# Patient Record
Sex: Male | Born: 1940
Health system: Southern US, Community
[De-identification: ages and names within clinical notes are randomized; demographics above are authoritative.]

## PROBLEM LIST (undated history)

## (undated) DIAGNOSIS — C8589 Other specified types of non-Hodgkin lymphoma, extranodal and solid organ sites: Secondary | ICD-10-CM

## (undated) DIAGNOSIS — I4891 Unspecified atrial fibrillation: Secondary | ICD-10-CM

## (undated) DIAGNOSIS — I10 Essential (primary) hypertension: Secondary | ICD-10-CM

## (undated) DIAGNOSIS — E119 Type 2 diabetes mellitus without complications: Secondary | ICD-10-CM

## (undated) DIAGNOSIS — I059 Rheumatic mitral valve disease, unspecified: Secondary | ICD-10-CM

## (undated) DIAGNOSIS — Z9289 Personal history of other medical treatment: Secondary | ICD-10-CM

## (undated) DIAGNOSIS — G4733 Obstructive sleep apnea (adult) (pediatric): Secondary | ICD-10-CM

## (undated) DIAGNOSIS — Z87442 Personal history of urinary calculi: Secondary | ICD-10-CM

## (undated) DIAGNOSIS — M519 Unspecified thoracic, thoracolumbar and lumbosacral intervertebral disc disorder: Secondary | ICD-10-CM

## (undated) DIAGNOSIS — I350 Nonrheumatic aortic (valve) stenosis: Secondary | ICD-10-CM

## (undated) DIAGNOSIS — S68119A Complete traumatic metacarpophalangeal amputation of unspecified finger, initial encounter: Secondary | ICD-10-CM

## (undated) DIAGNOSIS — I482 Chronic atrial fibrillation, unspecified: Secondary | ICD-10-CM

## (undated) DIAGNOSIS — I5032 Chronic diastolic (congestive) heart failure: Secondary | ICD-10-CM

## (undated) DIAGNOSIS — IMO0002 Reserved for concepts with insufficient information to code with codable children: Secondary | ICD-10-CM

## (undated) DIAGNOSIS — I499 Cardiac arrhythmia, unspecified: Secondary | ICD-10-CM

## (undated) DIAGNOSIS — C801 Malignant (primary) neoplasm, unspecified: Secondary | ICD-10-CM

## (undated) DIAGNOSIS — M1990 Unspecified osteoarthritis, unspecified site: Secondary | ICD-10-CM

## (undated) HISTORY — DX: Obstructive sleep apnea (adult) (pediatric): G47.33

## (undated) HISTORY — DX: Chronic atrial fibrillation, unspecified: I48.20

## (undated) HISTORY — PX: OTHER SURGICAL HISTORY: SHX169

## (undated) HISTORY — DX: Complete traumatic metacarpophalangeal amputation of unspecified finger, initial encounter: S68.119A

## (undated) HISTORY — DX: Nonrheumatic aortic (valve) stenosis: I35.0

## (undated) HISTORY — PX: TONSILLECTOMY: SUR1361

## (undated) HISTORY — DX: Essential (primary) hypertension: I10

## (undated) HISTORY — DX: Personal history of urinary calculi: Z87.442

## (undated) HISTORY — DX: Malignant (primary) neoplasm, unspecified: C80.1

## (undated) HISTORY — DX: Unspecified thoracic, thoracolumbar and lumbosacral intervertebral disc disorder: M51.9

## (undated) HISTORY — PX: COLONOSCOPY: SHX174

## (undated) HISTORY — DX: Reserved for concepts with insufficient information to code with codable children: IMO0002

## (undated) HISTORY — DX: Other specified types of non-hodgkin lymphoma, extranodal and solid organ sites: C85.89

---
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
## 1965-04-03 HISTORY — PX: APPENDECTOMY: SHX54

## 2001-05-15 ENCOUNTER — Encounter: Payer: Self-pay | Admitting: Emergency Medicine

## 2001-05-15 ENCOUNTER — Emergency Department (HOSPITAL_COMMUNITY): Admission: EM | Admit: 2001-05-15 | Discharge: 2001-05-15 | Payer: Self-pay | Admitting: Emergency Medicine

## 2002-08-13 ENCOUNTER — Ambulatory Visit (HOSPITAL_COMMUNITY): Admission: RE | Admit: 2002-08-13 | Discharge: 2002-08-13 | Payer: Self-pay | Admitting: Gastroenterology

## 2002-08-13 ENCOUNTER — Encounter (INDEPENDENT_AMBULATORY_CARE_PROVIDER_SITE_OTHER): Payer: Self-pay | Admitting: Specialist

## 2003-04-17 ENCOUNTER — Encounter: Admission: RE | Admit: 2003-04-17 | Discharge: 2003-04-17 | Payer: Self-pay

## 2003-04-24 ENCOUNTER — Encounter (INDEPENDENT_AMBULATORY_CARE_PROVIDER_SITE_OTHER): Payer: Self-pay | Admitting: Specialist

## 2003-04-24 ENCOUNTER — Ambulatory Visit (HOSPITAL_COMMUNITY): Admission: RE | Admit: 2003-04-24 | Discharge: 2003-04-24 | Payer: Self-pay | Admitting: Oncology

## 2003-05-05 ENCOUNTER — Ambulatory Visit (HOSPITAL_COMMUNITY): Admission: RE | Admit: 2003-05-05 | Discharge: 2003-05-05 | Payer: Self-pay | Admitting: Oncology

## 2003-05-05 ENCOUNTER — Encounter (INDEPENDENT_AMBULATORY_CARE_PROVIDER_SITE_OTHER): Payer: Self-pay | Admitting: Specialist

## 2003-05-06 ENCOUNTER — Inpatient Hospital Stay (HOSPITAL_COMMUNITY): Admission: AD | Admit: 2003-05-06 | Discharge: 2003-05-08 | Payer: Self-pay | Admitting: Oncology

## 2003-05-15 ENCOUNTER — Ambulatory Visit (HOSPITAL_COMMUNITY): Admission: RE | Admit: 2003-05-15 | Discharge: 2003-05-15 | Payer: Self-pay | Admitting: Surgery

## 2003-06-03 ENCOUNTER — Ambulatory Visit (HOSPITAL_COMMUNITY): Admission: RE | Admit: 2003-06-03 | Discharge: 2003-06-03 | Payer: Self-pay | Admitting: Oncology

## 2003-07-01 ENCOUNTER — Ambulatory Visit (HOSPITAL_COMMUNITY): Admission: RE | Admit: 2003-07-01 | Discharge: 2003-07-01 | Payer: Self-pay | Admitting: Oncology

## 2003-09-03 ENCOUNTER — Ambulatory Visit (HOSPITAL_COMMUNITY): Admission: RE | Admit: 2003-09-03 | Discharge: 2003-09-03 | Payer: Self-pay | Admitting: Oncology

## 2003-10-16 ENCOUNTER — Ambulatory Visit (HOSPITAL_BASED_OUTPATIENT_CLINIC_OR_DEPARTMENT_OTHER): Admission: RE | Admit: 2003-10-16 | Discharge: 2003-10-16 | Payer: Self-pay | Admitting: Surgery

## 2003-10-21 ENCOUNTER — Encounter: Admission: RE | Admit: 2003-10-21 | Discharge: 2003-10-21 | Payer: Self-pay | Admitting: Family Medicine

## 2004-04-05 ENCOUNTER — Ambulatory Visit: Payer: Self-pay | Admitting: Oncology

## 2004-04-06 ENCOUNTER — Ambulatory Visit (HOSPITAL_COMMUNITY): Admission: RE | Admit: 2004-04-06 | Discharge: 2004-04-06 | Payer: Self-pay | Admitting: Oncology

## 2004-08-03 ENCOUNTER — Ambulatory Visit: Payer: Self-pay | Admitting: Oncology

## 2004-08-12 ENCOUNTER — Ambulatory Visit (HOSPITAL_BASED_OUTPATIENT_CLINIC_OR_DEPARTMENT_OTHER): Admission: RE | Admit: 2004-08-12 | Discharge: 2004-08-12 | Payer: Self-pay | Admitting: Specialist

## 2004-08-12 ENCOUNTER — Ambulatory Visit (HOSPITAL_COMMUNITY): Admission: RE | Admit: 2004-08-12 | Discharge: 2004-08-12 | Payer: Self-pay | Admitting: Specialist

## 2004-09-05 ENCOUNTER — Encounter: Admission: RE | Admit: 2004-09-05 | Discharge: 2004-09-05 | Payer: Self-pay | Admitting: Specialist

## 2004-11-23 ENCOUNTER — Ambulatory Visit: Payer: Self-pay | Admitting: Oncology

## 2005-04-04 ENCOUNTER — Ambulatory Visit: Payer: Self-pay | Admitting: Oncology

## 2005-04-06 ENCOUNTER — Ambulatory Visit (HOSPITAL_COMMUNITY): Admission: RE | Admit: 2005-04-06 | Discharge: 2005-04-06 | Payer: Self-pay | Admitting: Oncology

## 2005-10-05 ENCOUNTER — Ambulatory Visit: Payer: Self-pay | Admitting: Oncology

## 2006-04-09 ENCOUNTER — Ambulatory Visit: Payer: Self-pay | Admitting: Oncology

## 2006-10-09 ENCOUNTER — Ambulatory Visit: Payer: Self-pay | Admitting: Oncology

## 2007-04-11 ENCOUNTER — Ambulatory Visit: Payer: Self-pay | Admitting: Oncology

## 2007-10-09 ENCOUNTER — Ambulatory Visit: Payer: Self-pay | Admitting: Oncology

## 2008-04-10 ENCOUNTER — Ambulatory Visit: Payer: Self-pay | Admitting: Oncology

## 2009-01-06 ENCOUNTER — Encounter (INDEPENDENT_AMBULATORY_CARE_PROVIDER_SITE_OTHER): Payer: Self-pay | Admitting: Interventional Cardiology

## 2009-01-06 ENCOUNTER — Ambulatory Visit (HOSPITAL_COMMUNITY): Admission: RE | Admit: 2009-01-06 | Discharge: 2009-01-06 | Payer: Self-pay | Admitting: Interventional Cardiology

## 2009-05-04 ENCOUNTER — Ambulatory Visit: Payer: Self-pay | Admitting: Oncology

## 2010-05-05 ENCOUNTER — Encounter: Payer: No Typology Code available for payment source | Admitting: Oncology

## 2010-05-05 ENCOUNTER — Inpatient Hospital Stay (HOSPITAL_BASED_OUTPATIENT_CLINIC_OR_DEPARTMENT_OTHER): Payer: No Typology Code available for payment source | Admitting: Oncology

## 2010-05-05 DIAGNOSIS — I4891 Unspecified atrial fibrillation: Secondary | ICD-10-CM

## 2010-05-05 DIAGNOSIS — Z7901 Long term (current) use of anticoagulants: Secondary | ICD-10-CM

## 2010-05-05 DIAGNOSIS — C8589 Other specified types of non-Hodgkin lymphoma, extranodal and solid organ sites: Secondary | ICD-10-CM

## 2010-08-19 NOTE — Op Note (Signed)
NAME:  Gregory [name], Gregory [name]                           ACCOUNT NO.:  [id number]   MEDICAL RECORD NO.:  [id number]                   PATIENT TYPE:  AMB   LOCATION:  DSC                                  FACILITY:  [facility]   PHYSICIAN:  Velora Heckler, M.D.                DATE OF BIRTH:  [birth year]   DATE OF PROCEDURE:  10/16/2003  DATE OF DISCHARGE:                                 OPERATIVE REPORT   PREOPERATIVE DIAGNOSIS:  History of lymphoma.   POSTOPERATIVE DIAGNOSIS:  History of lymphoma.   PROCEDURE:  Removal of infusion port.   SURGEON:  Velora Heckler, M.D.   ANESTHESIA:  Local with intravenous sedation.   PREPARATION:  Betadine.   COMPLICATIONS:  None.   ESTIMATED BLOOD LOSS:  Minimal.   INDICATIONS FOR PROCEDURE:  The patient is a 70 year old white male who has  completed a course of chemotherapy for treatment of lymphoma.  He now  desires removal of his infusion port from the left subclavian position.   DESCRIPTION OF PROCEDURE:  The procedure was done in OR 5 at the [facility].  The patient was brought to the operating room and placed in  a supine position on the operating table.  Following administration of  intravenous sedation, the patient was prepped and draped in the usual strict  aseptic fashion.  After ascertaining an adequate level of sedation had been  obtained, the skin overlying the Port-A-Cath is anesthetized with local  anesthetic.  A 2 cm incision was opened with a #15 blade and dissection was  carried down to the fibrous sheath surrounding the port.  Suture material  securing the port to the chest wall was extracted.  The entire port and  associated catheter are withdrawn as a unit.  The catheter tract is sutured  closed with 3-0 Vicryl figure-of-eight suture.  The fibrous sheath is  obliterated with electrocautery.  Subcutaneous tissues were closed with  interrupted 3-0 Vicryl sutures.  The skin was closed with running 4-0 Vicryl  subcuticular  suture.  The wound was washed and dried and Benzoin and Steri-  Strips are applied.  Sterile dressings were applied.  The patient was  brought to the recovery room in stable condition.  The patient tolerated the  procedure well.                                               Velora Heckler, M.D.    TMG/MEDQ  D:  10/16/2003  T:  10/16/2003  Job:  [id number]

## 2010-08-19 NOTE — Op Note (Signed)
NAME:  Gregory [name], Gregory [name]                           ACCOUNT NO.:  [id number]   MEDICAL RECORD NO.:  [id number]                   PATIENT TYPE:  AMB   LOCATION:  DAY                                  [facility]ACILITY:  [facility]   PHYSICIAN:  Velora Heckler, M.D.                DATE OF BIRTH:  04/18/1940   DATE OF PROCEDURE:  05/15/2003  DATE OF DISCHARGE:                                 OPERATIVE REPORT   PREOPERATIVE DIAGNOSIS:  Lymphoma.   POSTOPERATIVE DIAGNOSIS:  Lymphoma.   OPERATION/PROCEDURE:  Placement of left subclavian vein infusion port.   SURGEON:  Velora Heckler, M.D.   ANESTHESIA:  Local with intravenous sedation.   PREPARATION:  Betadine.   COMPLICATIONS:  None.   ESTIMATED BLOOD LOSS:  Minimal.   INDICATIONS:  The patient is a 70 year old white male with newly diagnosed  high-grade lymphoma.  Dr. Mancel Bale was treating him with chemotherapy.  They desire a chronic venous access device.   DESCRIPTION OF PROCEDURE:  The procedure was done in OR #6 at the [facility].  The patient was brought to the operating room and  placed in the supine position on the operating room table.  Following  administration of intravenous sedation, the patient was prepped and draped  in the usual strict aseptic fashion.  After ascertaining an adequate level  of sedation had been obtained, the skin beneath the left clavicle was  anesthetized with local anesthetic.  The patient was placed in  Trendelenburg.  Using an 18-gauge Seldinger needle, the left subclavian vein  is entered in a single pass.  A soft J-shaped guide wire is inserted and the  needle is removed.  C-arm fluoroscopy confirms proper guide wire placement.  Next, the skin of the left chest wall is anesthetized with local anesthetic.  A 2 cm incision is made with a #15 blade.  Subcutaneous pocket is created to  accommodate the infusion port.  Hemostasis is obtained with the  electrocautery.  Incision is also made  at the site of guide wire insertion.  Catheter is tunneled subcutaneously between the two wounds.  Port is  positioned in the subcutaneous pocket and secured to the chest wall with a 2-  0 Prolene suture.  Catheter is measured to the appropriate length and  divided.  Using a dilator and peel-away sleeve, the catheter is introduced  into the left subclavian vein.  At this point the guide wire, dilator and  peel-away sleeve have all been removed.  C-arm fluoroscopy is used to  confirm catheter positioning.  The ports aspirates blood easily and flushes  easily with heparinized saline.  Port is flushed with 5 mL of 100 unit per  mL heparin lock flush.   Subcutaneous tissues are closed with interrupted 3-0 Vicryl sutures.  Skin  edges are reapproximated with interrupted 4-0 Vicryl subcuticular sutures.  Wounds were washed and dried  and Benzoin and Steri-Strips are applied.  Sterile dressings are applied.  Portable chest x-ray will be obtained in the  recovery room to confirm final positioning of the catheter.  The patient  tolerated the procedure well.                                               Velora Heckler, M.D.    TMG/MEDQ  D:  05/15/2003  T:  05/15/2003  Job:  [id number]   cc:   Jillyn Hidden B. Truett Perna, M.D.  [address]  [place]  [place] [address]  Fax: [phone]

## 2010-08-19 NOTE — Op Note (Signed)
   [name], Gregory                             ACCOUNT NO.:  [id number]   MEDICAL RECORD NO.:  [id number]                   PATIENT TYPE:  AMB   LOCATION:  ENDO                                 FACILITY:  [facility]   PHYSICIAN:  Anselmo Rod, M.D.               DATE OF BIRTH:  10-10-1940   DATE OF PROCEDURE:  08/13/2002  DATE OF DISCHARGE:                                 OPERATIVE REPORT   PROCEDURE:  Colonoscopy with cold biopsies x2.   ENDOSCOPIST:  Anselmo Rod, M.D.   INSTRUMENT USED:  Olympus video colonoscope.   INDICATION FOR PROCEDURE:  A 70 year old white male undergoing screening  colonoscopy with rectal bleeding.  Rule out colonic polyps, masses,  hemorrhoids, etc.   PREPROCEDURE PREPARATION:  Informed consent was procured from the patient.  The patient was had fasted for eight hours prior to the procedure and  prepped with a bottle of magnesium citrate and a gallon of GoLYTELY the  night prior to the procedure.   PREPROCEDURE PHYSICAL:  VITAL SIGNS:  The patient had stable vital signs.  NECK:  Supple.  CHEST:  Clear to auscultation.  S1, S2 regular.  ABDOMEN:  Soft with normal bowel sounds.   DESCRIPTION OF PROCEDURE:  The patient was placed in the left lateral  decubitus position and sedated with 50 mg of Demerol and 5 mg of Versed  intravenously.  Once the patient was adequately sedate and maintained on low-  flow oxygen and continuous cardiac monitoring, the Olympus video colonoscope  was advanced from the rectum to the cecum without difficulty.  Two small  sessile polyps were biopsied from the rectum, and the rest of the colonic  mucosa appeared healthy and without lesions.  Small, nonbleeding internal  hemorrhoids were seen on retroflexion in the rectum, a few early diverticula  were seen in the left colon.   IMPRESSION:  1. Small, nonbleeding internal hemorrhoid.  2. Two small sessile polyps biopsied from the rectum.  3. A few scattered early  left-sided diverticula.   RECOMMENDATIONS:  1. Await pathology results.  2.     A high-fiber diet with liberal fluid intake has been recommended.  3. Avoid all nonsteroidals, including aspirin, for the next two to three     weeks.  4. Outpatient follow-up in the next two weeks for further recommendations.                                               Anselmo Rod, M.D.    JNM/MEDQ  D:  08/13/2002  T:  08/14/2002  Job:  [id number]   cc:   Gabriel Earing, M.D.  [address]  [place]  [place] [address]  Fax: [phone]

## 2010-08-19 NOTE — H&P (Signed)
NAME:  Gregory [name], Gregory [name]                           ACCOUNT NO.:  [id number]   MEDICAL RECORD NO.:  [id number]                   PATIENT TYPE:  INP   LOCATION:  0255                                 FACILITY:  [facility]   PHYSICIAN:  Leighton Roach. Truett Perna, M.D.              DATE OF BIRTH:  1940-07-31   DATE OF ADMISSION:  05/06/2003  DATE OF DISCHARGE:                                HISTORY & PHYSICAL   HISTORY OF PRESENT ILLNESS:  Mr. Scholz is a 70 year old gentleman who  initially presented in January 2005 with an approximate two-month history of  back pain. CT scan of the abdomen and pelvis showed extensive  retroperitoneal and mesenteric adenopathy.  Core biopsies of the  retroperitoneal mass on April 23, 2003, showed non-Hodgkin's B-cell  lymphoma with mixed small and large cells with an abundance of large cells.  The abundance of large lymphoid cells in areas was suggestive of a high-  grade process. Flow cytometry showed a monoclonal, lambda restricted B-cell  population expressing pan-B-cell antigen including expressing or CD-20.  Additional material was requested by the pathologist. Mr. Cuda was evaluated  by Dr. Gerrit Friends on the day of admission with exploratory laparotomy planned  for May 15, 2003. Bone marrow biopsy was done on May 05, 2003, with  results currently pending. Chest CT on April 17, 2003, was negative for  adenopathy in the chest.  Initial LDH in January was elevated at 314.   Mr. Simmer presented to the office today for an unscheduled visit secondary to  worsening right-sided back pain over the past 24 hours. He describes the  pain as a constant ache. He denied any radiation of the pain. He stated  the pain was similar to the back pain he initially developed in November,  but was much more intense.  He has been taking Oxycodone with no relief. He  denies any extremity weakness or numbness. He has been mildly constipated  since beginning pain medications, but  otherwise bowels moving normally. He  denies any bladder problems. He denies any fever or sweats.   PAST MEDICAL HISTORY:  1. Non-Hodgkin's lymphoma as above.  2. Hypertension.  3. Peptic ulcer disease/GI bleed in 1965.  4. Partial amputation, right fourth and fifth fingers.  5. Ruptured disk.  6. Kidney stones.  7. Appendectomy in 1967.   MEDICATIONS:  1. Oxycodone p.r.n.  2. Lisinopril/hydrochlorothiazide 20/25 daily.  3. Protonix 40 mg daily.   ALLERGIES:  1. NONSTEROIDALS.  2. BISOPROLOL/HYDROCHLOROTHIAZIDE.   FAMILY HISTORY:  Mother deceased with lung cancer; father deceased from  motor vehicle accident; brother and sister are both healthy.   SOCIAL HISTORY:  Mr. Smoak lives in [place]. He is single. He has no  children. He is employed as a Naval architect. He has no history of ETOH or  tobacco use.   REVIEW OF SYSTEMS:  Per HPI.   PHYSICAL EXAMINATION:  VITAL SIGNS:  Temperature 97.6, heart rate 71,  respirations 24, blood pressure 169/78, weight 238 pounds.  GENERAL: A pleasant Caucasian male who appears uncomfortable secondary to  pain.  HEENT: Normocephalic and atraumatic. Pupils equal, round, and reactive to  light. Extraocular motions intact. Sclerae anicteric. Oropharynx is clear.  There are no cervical, supraclavicular, or axillary lymph nodes.  CHEST: Lungs are clear bilaterally. No wheezes or rales.  CARDIAC: Regular rate and rhythm. No murmur.  ABDOMEN: Soft, nontender. A palpable mass in the left mid abdomen extending  into the right abdomen.  EXTREMITIES: No clubbing, cyanosis, or edema.  NEUROLOGIC: Alert and oriented.  __________ extremities. Motor strength 5/5.  Gait is normal.  MUSCULOSKELETAL: Tender to palpation over the right flank region.   LABORATORY DATA:  Hemoglobin 13.4, white count 9.9, platelet count 341,000.  LDH 252. Sodium 131, potassium 3.9, BUN 10, creatinine 0.9, glucose 118.  Total bilirubin 0.4, alkaline phosphatase 76, SGOT 24,  SGPT 21, total  protein 7.1, albumin 4.1, calcium 10.   IMPRESSION/PLAN:  1. Newly diagnosed nonHodgkin's lymphoma, CD-20 positive--proceed with     CHOP/Rituxan, May 07, 2003. We will obtain a MUGA scan prior to     treatment.  He will begin allopurinol tonight and intravenous hydration     will be initiated.  2. Pain secondary to #1. Morphine and oxycodone for pain.  3. Hypertension.  4. Remote history of  peptic ulcer disease/gastrointestinal bleed--continue     Protonix.   Patient interviewed and examined by Dr. Truett Perna and the above plan  reviewed.     Lonna Cobb, N.P.                         Leighton Roach. Truett Perna, M.D.    LT/MEDQ  D:  05/07/2003  T:  05/07/2003  Job:  [id number]   cc:   Velora Heckler, M.D.  [address] [place]  [place] [address]  Fax: [phone]

## 2010-08-19 NOTE — Discharge Summary (Signed)
NAME:  Gregory [name], Gregory [name]                           ACCOUNT NO.:  [id number]   MEDICAL RECORD NO.:  [id number]                   PATIENT TYPE:  INP   LOCATION:  0255                                 FACILITY:  [facility]   PHYSICIAN:  Leighton Roach. Truett Perna, M.D.              DATE OF BIRTH:  [birth year]   DATE OF ADMISSION:  05/06/2003  DATE OF DISCHARGE:  05/08/2003                                 DISCHARGE SUMMARY   DISCHARGE CONDITION:  Improved.   DIAGNOSES:  1. Abdominal/flank pain secondary to non-Hodgkin's lymphoma, improved at     discharge.  2. Non-Hodgkin's lymphoma presenting with abdominal pain;     a. CT scan confirming abdominal/retroperitoneal lymphadenopathy;     b. Core biopsy of the abdominal mass on April 24, 2003 confirming a B        cell non-Hodgkin's lymphoma which was mixed small/large cells most        consistent with a high grade non-Hodgkin's lymphoma;     c. Staging evaluation including a CT scan of the chest and bone marrow        biopsy negative;     d. Cycle #1 of CHOP/Rituxan chemotherapy given on May 07, 2003.  3. History of hypertension.  4. Remote history of peptic ulcer disease.   CONSULTATIONS:  None.   PROCEDURES:  1. MUGA scan.  2. Infusional chemotherapy.   HOSPITAL COURSE:  The patient is a 70 year old with a recent diagnosis of  non-Hodgkin's lymphoma.  The diagnosis was made from an ultrasound guided  core biopsy of a large abdominal mass.  This confirmed a B cell lymphoma,  positive for CD20 with a significant number of large cells.  I discussed the  pathology with Dr. Pershing Proud and he feels this is a high grade lymphoma.  We had  plans to proceed with an excisional biopsy to confirm the subtype of  lymphoma (? follicular large cell versus diffuse large cell).  However, the  patient presented with severe abdominal and flank pain on February 2.  The  pain was not relieved with Percocet.  He was admitted on February 2 for  further evaluation and  initiation of systemic therapy.   We felt the pain was due to lymphoma involving the abdominal mass and  retroperitoneal lymph nodes.   We decided to proceed with systemic therapy. We reviewed the risks and  potential toxicities associated with systemic therapy in detail with the  patient.  He agreed to proceed.  A MUGA scan on February 3 confirmed a left  ventricular ejection fraction of 59% with normal wall motion.   The chemotherapy was given as follows:  Cyclophosphamide at a dose of 750 mg  per meter square was given a dose of 1725 mg by intravenous infusion;  doxorubicin at a dose of 50 mg per meter square was given at a dose of 115  mg by intravenous infusion; vincristine  was given at a dose of 2 mg by  intravenous infusion and he was then treated with prednisone at a dose of  100 mg daily and this will be continued for a five day course; Rituxan at a  dose of 375 mg per meter square was given at 850 mg by intravenous infusion  per protocol.   The patient was placed on Allopurinol and intravenous hydration prior to and  following chemotherapy.  He tolerated the chemotherapy without significant  acute toxicity.  Prior to the morning of February 4 the abdominal and flank  pain was much improved. His only complaint was constipation.  This is likely  related to narcotic analgesics.  He has no neurologic symptoms of lower  extremity pain/weakness to suggest a spinal process.   He was discharged to home in improved condition on May 08, 2003.   DISCHARGE MEDICATIONS:  1. Allopurinol 300 mg daily for five days to start on February 5.  2. Protonix 40 mg daily.  3. Prednisone 100 mg daily for three days beginning on February 5.  4. Oxycodone 5 mg one to two q.4h p.r.n.  5. Lisinopril/hydrochlorothiazide 20/25 mg daily.  6. Compazine 10 mg q.6h p.r.n. nausea.  7. Senokot-S two tablets daily p.r.n.  8. Milk of Magnesia p.r.n.   SPECIAL INSTRUCTIONS:  He is to call for fever  greater than 101 degrees,  shortness of breath or for increased pain.   FOLLOW UP:  1. He will be seen at the cancer center on February 4 for a Neulasta     injection.  2. He will be seen in our office on February 8.                                               Leighton Roach Truett Perna, M.D.    GBS/MEDQ  D:  05/08/2003  T:  05/08/2003  Job:  [id number]   cc:   Velora Heckler, M.D.  [address] [place]  [place] [address]  Fax: [phone]

## 2010-08-19 NOTE — Op Note (Signed)
NAMECHAUNCEY, SCIULLI NO.:  [id number]   MEDICAL RECORD NO.:  [id number]          PATIENT TYPE:  AMB   LOCATION:  NESC                         FACILITY:  [facility]   PHYSICIAN:  Jene Every, M.D.    DATE OF BIRTH:  10/17/1940   DATE OF PROCEDURE:  08/12/2004  DATE OF DISCHARGE:                                 OPERATIVE REPORT   PREOPERATIVE DIAGNOSES:  Medial meniscus tear left knee.   POSTOPERATIVE DIAGNOSES:  1.  Medial meniscus tear left knee.  2.  Hypertrophic synovitis medial compartment.  3.  Chondromalacia of the patella.   PROCEDURE:  Left knee arthroscopy, partial medial meniscectomy,  chondroplasty of the patella, shaving hypertrophic synovitis medial  compartment.   BRIEF HISTORY:  This is a 70 year old with refractory knee pain following a  work related injury. He had persistent knee pain, locking and giving way.  Operative intervention was indicated for diagnosis and treatment. He has  degenerative changes of the patellar tendon as well. The risks and benefits  discussed including bleeding, infection, injury to neurovascular structures,  no change in symptoms, worsening symptoms, need for repeat debridement, etc.   TECHNIQUE:  The patient in supine position after the induction of adequate  general anesthesia and 1 g of Kefzol, the left lower extremity was prepped  and draped in the usual sterile fashion. A lateral parapatellar portal and  superomedial parapatellar portal was fashioned with a #11 bladder, Ingress  cannula atraumatically placed, irrigant was utilized to insufflate the  joint. A medial parapatellar portal was fashioned with a #11 blade after  location with an 18 gauge needle sparing the medial meniscus. Inspection of  the suprapatellar pouch revealed normal patellofemoral tracking, there is  chondromalacia and grade 3 changes. The shaver was introduced and utilized  to perform a chondroplasty of the patella to a stable base.  Hypertrophic  synovitis was noted in the medial compartment and this was shaved as well. A  chondral flap tear was noted on the medial femoral condyle, this was shaved  in a stable base as well. The posterior horn of the medial meniscus was  debrided to a stable base utilizing a 4.2 Kuda shaver. The remainder was  stable to propalpation without evidence of residual tearing. There were some  grade 2 changes at the tibia,  hypertrophic changes, degenerative changes of  the ACL. The PCL was essentially degenerative changes as well. The lateral  compartment grade 2 changes, degenerative fraying of the meniscus which was  shaved. The gutters were unremarkable. The lateral meniscus stable to  propalpation without evidence of tear. All compartments are revisited, no  residual pathology amendable to surgical treatment was noted. The knee was  copiously lavaged, all instrumentation was removed. The portals were closed  with 4-0 nylon simple sutures, Marcaine 0.25% with  epinephrine was infiltrated in the joint. The wound was dressed sterilely,  he was awoken without difficulty and transported to the recovery room in  satisfactory condition.   The patient tolerated the procedure well with no complications. Minimal  blood loss.  JB/MEDQ  D:  08/12/2004  T:  08/12/2004  Job:  [id number]

## 2011-04-19 DIAGNOSIS — E7800 Pure hypercholesterolemia, unspecified: Secondary | ICD-10-CM | POA: Diagnosis not present

## 2011-04-19 DIAGNOSIS — IMO0001 Reserved for inherently not codable concepts without codable children: Secondary | ICD-10-CM | POA: Diagnosis not present

## 2011-04-19 DIAGNOSIS — I10 Essential (primary) hypertension: Secondary | ICD-10-CM | POA: Diagnosis not present

## 2011-04-19 DIAGNOSIS — I4891 Unspecified atrial fibrillation: Secondary | ICD-10-CM | POA: Diagnosis not present

## 2011-04-19 DIAGNOSIS — E669 Obesity, unspecified: Secondary | ICD-10-CM | POA: Diagnosis not present

## 2011-04-19 DIAGNOSIS — Z7901 Long term (current) use of anticoagulants: Secondary | ICD-10-CM | POA: Diagnosis not present

## 2011-04-21 ENCOUNTER — Telehealth: Payer: Self-pay | Admitting: Oncology

## 2011-04-21 NOTE — Telephone Encounter (Signed)
called pts home and the number was not in service.  called pts work and pts son number and none of those numbers are in service.  will mail pt feb appts to his home with appt d/t

## 2011-05-05 ENCOUNTER — Encounter: Payer: Self-pay | Admitting: Oncology

## 2011-05-05 ENCOUNTER — Ambulatory Visit (HOSPITAL_BASED_OUTPATIENT_CLINIC_OR_DEPARTMENT_OTHER): Payer: Medicare Other | Admitting: Oncology

## 2011-05-05 VITALS — BP 136/78 | HR 76 | Temp 98.3°F | Ht 70.5 in | Wt 282.8 lb

## 2011-05-05 DIAGNOSIS — C8589 Other specified types of non-Hodgkin lymphoma, extranodal and solid organ sites: Secondary | ICD-10-CM | POA: Diagnosis not present

## 2011-05-05 DIAGNOSIS — I4891 Unspecified atrial fibrillation: Secondary | ICD-10-CM | POA: Diagnosis not present

## 2011-05-05 DIAGNOSIS — Z7901 Long term (current) use of anticoagulants: Secondary | ICD-10-CM

## 2011-05-05 NOTE — Progress Notes (Signed)
OFFICE PROGRESS NOTE   INTERVAL HISTORY:   He returns as scheduled. He had the "flu "in December. He had a cough and sore throat. These symptoms have resolved. He feels well at present. He denies fever, anorexia, night sweats, and pain. He received an influenza vaccine.  Objective:  Vital signs in last 24 hours:  Blood pressure 136/78, pulse 76, temperature 98.3 F (36.8 C), temperature source Oral, height 5' 10.5" (1.791 m), weight 282 lb 12.8 oz (128.277 kg).    HEENT: Oropharynx without visible mass. Neck without mass. Lymphatics: No cervical, supraclavicular, axillary, or inguinal nodes Resp: Lungs clear bilaterally Cardio: Irregular GI: No hepatosplenomegaly, no mass Vascular: Trace pretibial edema bilaterally     Medications: I have reviewed the patient's current medications.  Assessment/Plan: 1. Non-Hodgkin lymphoma diagnosed in 04/2003.  He remains in clinical remission. 2. Atrial fibrillation, maintained on Coumadin.   Disposition:  He remains in clinical remission from the non-Hodgkin's lymphoma. He would like to continue followup at the cancer Center. He will return for an office visit in one year.   Lucile Shutters, MD  05/05/2011  4:56 PM

## 2011-05-09 ENCOUNTER — Ambulatory Visit: Payer: No Typology Code available for payment source | Admitting: Oncology

## 2011-05-18 DIAGNOSIS — Z7901 Long term (current) use of anticoagulants: Secondary | ICD-10-CM | POA: Diagnosis not present

## 2011-05-25 ENCOUNTER — Ambulatory Visit: Payer: No Typology Code available for payment source | Admitting: Oncology

## 2011-05-30 DIAGNOSIS — E669 Obesity, unspecified: Secondary | ICD-10-CM | POA: Diagnosis not present

## 2011-05-30 DIAGNOSIS — I4891 Unspecified atrial fibrillation: Secondary | ICD-10-CM | POA: Diagnosis not present

## 2011-05-30 DIAGNOSIS — I10 Essential (primary) hypertension: Secondary | ICD-10-CM | POA: Diagnosis not present

## 2011-05-30 DIAGNOSIS — I059 Rheumatic mitral valve disease, unspecified: Secondary | ICD-10-CM | POA: Diagnosis not present

## 2011-05-30 DIAGNOSIS — E7800 Pure hypercholesterolemia, unspecified: Secondary | ICD-10-CM | POA: Diagnosis not present

## 2011-06-12 DIAGNOSIS — I4891 Unspecified atrial fibrillation: Secondary | ICD-10-CM | POA: Diagnosis not present

## 2011-06-12 DIAGNOSIS — Z7901 Long term (current) use of anticoagulants: Secondary | ICD-10-CM | POA: Diagnosis not present

## 2011-07-08 DIAGNOSIS — B029 Zoster without complications: Secondary | ICD-10-CM | POA: Diagnosis not present

## 2011-07-26 DIAGNOSIS — Z7901 Long term (current) use of anticoagulants: Secondary | ICD-10-CM | POA: Diagnosis not present

## 2011-07-26 DIAGNOSIS — I4891 Unspecified atrial fibrillation: Secondary | ICD-10-CM | POA: Diagnosis not present

## 2011-09-06 DIAGNOSIS — Z7901 Long term (current) use of anticoagulants: Secondary | ICD-10-CM | POA: Diagnosis not present

## 2011-10-11 DIAGNOSIS — Z7901 Long term (current) use of anticoagulants: Secondary | ICD-10-CM | POA: Diagnosis not present

## 2011-10-11 DIAGNOSIS — E7800 Pure hypercholesterolemia, unspecified: Secondary | ICD-10-CM | POA: Diagnosis not present

## 2011-10-11 DIAGNOSIS — I4891 Unspecified atrial fibrillation: Secondary | ICD-10-CM | POA: Diagnosis not present

## 2011-10-11 DIAGNOSIS — E669 Obesity, unspecified: Secondary | ICD-10-CM | POA: Diagnosis not present

## 2011-10-11 DIAGNOSIS — I10 Essential (primary) hypertension: Secondary | ICD-10-CM | POA: Diagnosis not present

## 2011-10-11 DIAGNOSIS — E119 Type 2 diabetes mellitus without complications: Secondary | ICD-10-CM | POA: Diagnosis not present

## 2011-12-12 DIAGNOSIS — Z7901 Long term (current) use of anticoagulants: Secondary | ICD-10-CM | POA: Diagnosis not present

## 2011-12-12 DIAGNOSIS — Z23 Encounter for immunization: Secondary | ICD-10-CM | POA: Diagnosis not present

## 2012-02-13 DIAGNOSIS — I4891 Unspecified atrial fibrillation: Secondary | ICD-10-CM | POA: Diagnosis not present

## 2012-02-13 DIAGNOSIS — Z7901 Long term (current) use of anticoagulants: Secondary | ICD-10-CM | POA: Diagnosis not present

## 2012-02-27 DIAGNOSIS — E7800 Pure hypercholesterolemia, unspecified: Secondary | ICD-10-CM | POA: Diagnosis not present

## 2012-02-27 DIAGNOSIS — I10 Essential (primary) hypertension: Secondary | ICD-10-CM | POA: Diagnosis not present

## 2012-02-27 DIAGNOSIS — I4891 Unspecified atrial fibrillation: Secondary | ICD-10-CM | POA: Diagnosis not present

## 2012-02-27 DIAGNOSIS — I059 Rheumatic mitral valve disease, unspecified: Secondary | ICD-10-CM | POA: Diagnosis not present

## 2012-02-27 DIAGNOSIS — E669 Obesity, unspecified: Secondary | ICD-10-CM | POA: Diagnosis not present

## 2012-03-06 DIAGNOSIS — I059 Rheumatic mitral valve disease, unspecified: Secondary | ICD-10-CM | POA: Diagnosis not present

## 2012-03-06 DIAGNOSIS — I4891 Unspecified atrial fibrillation: Secondary | ICD-10-CM | POA: Diagnosis not present

## 2012-03-06 DIAGNOSIS — I10 Essential (primary) hypertension: Secondary | ICD-10-CM | POA: Diagnosis not present

## 2012-03-06 DIAGNOSIS — E669 Obesity, unspecified: Secondary | ICD-10-CM | POA: Diagnosis not present

## 2012-03-06 DIAGNOSIS — E7800 Pure hypercholesterolemia, unspecified: Secondary | ICD-10-CM | POA: Diagnosis not present

## 2012-03-07 DIAGNOSIS — E119 Type 2 diabetes mellitus without complications: Secondary | ICD-10-CM | POA: Diagnosis not present

## 2012-04-12 DIAGNOSIS — E119 Type 2 diabetes mellitus without complications: Secondary | ICD-10-CM | POA: Diagnosis not present

## 2012-04-12 DIAGNOSIS — E7800 Pure hypercholesterolemia, unspecified: Secondary | ICD-10-CM | POA: Diagnosis not present

## 2012-04-12 DIAGNOSIS — E669 Obesity, unspecified: Secondary | ICD-10-CM | POA: Diagnosis not present

## 2012-04-12 DIAGNOSIS — Z7901 Long term (current) use of anticoagulants: Secondary | ICD-10-CM | POA: Diagnosis not present

## 2012-04-12 DIAGNOSIS — I10 Essential (primary) hypertension: Secondary | ICD-10-CM | POA: Diagnosis not present

## 2012-04-12 DIAGNOSIS — I4891 Unspecified atrial fibrillation: Secondary | ICD-10-CM | POA: Diagnosis not present

## 2012-04-29 DIAGNOSIS — Z7901 Long term (current) use of anticoagulants: Secondary | ICD-10-CM | POA: Diagnosis not present

## 2012-05-06 ENCOUNTER — Ambulatory Visit (HOSPITAL_BASED_OUTPATIENT_CLINIC_OR_DEPARTMENT_OTHER): Payer: Medicare Other | Admitting: Oncology

## 2012-05-06 ENCOUNTER — Telehealth: Payer: Self-pay | Admitting: Oncology

## 2012-05-06 VITALS — BP 142/67 | HR 86 | Temp 97.4°F | Resp 20 | Ht 70.5 in | Wt 293.4 lb

## 2012-05-06 DIAGNOSIS — I4891 Unspecified atrial fibrillation: Secondary | ICD-10-CM

## 2012-05-06 DIAGNOSIS — Z7901 Long term (current) use of anticoagulants: Secondary | ICD-10-CM | POA: Diagnosis not present

## 2012-05-06 DIAGNOSIS — C8590 Non-Hodgkin lymphoma, unspecified, unspecified site: Secondary | ICD-10-CM | POA: Insufficient documentation

## 2012-05-06 DIAGNOSIS — C8589 Other specified types of non-Hodgkin lymphoma, extranodal and solid organ sites: Secondary | ICD-10-CM | POA: Diagnosis not present

## 2012-05-06 NOTE — Telephone Encounter (Signed)
gv and printed appt schedule for pot for Jan 2015

## 2012-05-06 NOTE — Progress Notes (Signed)
   [facility]    OFFICE PROGRESS NOTE   INTERVAL HISTORY:   He returns as scheduled. His appetite. No fever or night sweats. He reports becoming ill after receiving the influenza vaccine in December. He had "shingles "over the left chest last year. He had received the zoster vaccine.  Objective:  Vital signs in last 24 hours:  Blood pressure 142/67, pulse 86, temperature 97.4 F (36.3 C), temperature source Oral, resp. rate 20, height 5' 10.5" (1.791 m), weight 293 lb 6.4 oz (133.085 kg).    HEENT: Neck without mass Lymphatics: No cervical, supraclavicular, axillary, or inguinal nodes Resp: Lungs clear bilaterally Cardio: Irregular, 2/6 systolic murmur GI: No hepatosplenomegaly, nontender Vascular: 1+ pitting edema below the left greater than right knee  Skin: Several scars over the left anterior/posterior chest wall consistent with a resolved a zoster rash    Medications: I have reviewed the patient's current medications.  Assessment/Plan: 1.Non-Hodgkin lymphoma diagnosed in 04/2003. He remains in clinical remission.  2. Atrial fibrillation, maintained on Coumadin.    Disposition:  Mr. Ohern remains in clinical remission from non-Hodgkin's lymphoma. He would like to continue followup at the cancer Center. Mr. Doreatha Lew return for an office visit in one year.  He reported a family history of breast, lung, and ovarian cancer. There is also a family history of lymphoma and leukemia. A cousin is currently being evaluated at [facility]. I suggested he discuss a genetics counseling referral with his cousin.   Thornton Papas, MD  05/06/2012  12:16 PM

## 2012-06-24 DIAGNOSIS — I4891 Unspecified atrial fibrillation: Secondary | ICD-10-CM | POA: Diagnosis not present

## 2012-06-24 DIAGNOSIS — Z7901 Long term (current) use of anticoagulants: Secondary | ICD-10-CM | POA: Diagnosis not present

## 2012-09-23 DIAGNOSIS — Z7901 Long term (current) use of anticoagulants: Secondary | ICD-10-CM | POA: Diagnosis not present

## 2012-09-24 DIAGNOSIS — K648 Other hemorrhoids: Secondary | ICD-10-CM | POA: Diagnosis not present

## 2012-09-24 DIAGNOSIS — Z1211 Encounter for screening for malignant neoplasm of colon: Secondary | ICD-10-CM | POA: Diagnosis not present

## 2012-09-24 DIAGNOSIS — R198 Other specified symptoms and signs involving the digestive system and abdomen: Secondary | ICD-10-CM | POA: Diagnosis not present

## 2012-09-24 DIAGNOSIS — R635 Abnormal weight gain: Secondary | ICD-10-CM | POA: Diagnosis not present

## 2012-10-22 DIAGNOSIS — E119 Type 2 diabetes mellitus without complications: Secondary | ICD-10-CM | POA: Diagnosis not present

## 2012-10-22 DIAGNOSIS — E7800 Pure hypercholesterolemia, unspecified: Secondary | ICD-10-CM | POA: Diagnosis not present

## 2012-10-22 DIAGNOSIS — Z7901 Long term (current) use of anticoagulants: Secondary | ICD-10-CM | POA: Diagnosis not present

## 2012-10-22 DIAGNOSIS — E669 Obesity, unspecified: Secondary | ICD-10-CM | POA: Diagnosis not present

## 2012-10-22 DIAGNOSIS — Z79899 Other long term (current) drug therapy: Secondary | ICD-10-CM | POA: Diagnosis not present

## 2012-10-22 DIAGNOSIS — E1149 Type 2 diabetes mellitus with other diabetic neurological complication: Secondary | ICD-10-CM | POA: Diagnosis not present

## 2012-10-22 DIAGNOSIS — I4891 Unspecified atrial fibrillation: Secondary | ICD-10-CM | POA: Diagnosis not present

## 2012-10-22 DIAGNOSIS — I10 Essential (primary) hypertension: Secondary | ICD-10-CM | POA: Diagnosis not present

## 2012-11-06 DIAGNOSIS — Z1211 Encounter for screening for malignant neoplasm of colon: Secondary | ICD-10-CM | POA: Diagnosis not present

## 2012-11-06 DIAGNOSIS — D126 Benign neoplasm of colon, unspecified: Secondary | ICD-10-CM | POA: Diagnosis not present

## 2012-11-06 DIAGNOSIS — K5730 Diverticulosis of large intestine without perforation or abscess without bleeding: Secondary | ICD-10-CM | POA: Diagnosis not present

## 2012-11-26 DIAGNOSIS — E7800 Pure hypercholesterolemia, unspecified: Secondary | ICD-10-CM | POA: Diagnosis not present

## 2012-11-26 DIAGNOSIS — I10 Essential (primary) hypertension: Secondary | ICD-10-CM | POA: Diagnosis not present

## 2012-11-26 DIAGNOSIS — E669 Obesity, unspecified: Secondary | ICD-10-CM | POA: Diagnosis not present

## 2012-11-26 DIAGNOSIS — I059 Rheumatic mitral valve disease, unspecified: Secondary | ICD-10-CM | POA: Diagnosis not present

## 2012-11-26 DIAGNOSIS — I4891 Unspecified atrial fibrillation: Secondary | ICD-10-CM | POA: Diagnosis not present

## 2013-01-14 DIAGNOSIS — Z7901 Long term (current) use of anticoagulants: Secondary | ICD-10-CM | POA: Diagnosis not present

## 2013-03-10 ENCOUNTER — Telehealth: Payer: Self-pay | Admitting: Cardiology

## 2013-03-10 ENCOUNTER — Ambulatory Visit: Payer: Medicare Other | Admitting: Interventional Cardiology

## 2013-03-10 DIAGNOSIS — I059 Rheumatic mitral valve disease, unspecified: Secondary | ICD-10-CM

## 2013-03-10 DIAGNOSIS — I359 Nonrheumatic aortic valve disorder, unspecified: Secondary | ICD-10-CM

## 2013-03-10 NOTE — Telephone Encounter (Signed)
Ordered echo, pt due for 1 year follow up echo

## 2013-03-11 DIAGNOSIS — E119 Type 2 diabetes mellitus without complications: Secondary | ICD-10-CM | POA: Diagnosis not present

## 2013-03-28 ENCOUNTER — Other Ambulatory Visit (HOSPITAL_COMMUNITY): Payer: Medicare Other

## 2013-04-01 ENCOUNTER — Ambulatory Visit (HOSPITAL_COMMUNITY): Payer: Medicare Other | Attending: Cardiovascular Disease | Admitting: Radiology

## 2013-04-01 ENCOUNTER — Encounter: Payer: Self-pay | Admitting: Cardiovascular Disease

## 2013-04-01 DIAGNOSIS — I359 Nonrheumatic aortic valve disorder, unspecified: Secondary | ICD-10-CM | POA: Diagnosis not present

## 2013-04-01 DIAGNOSIS — E785 Hyperlipidemia, unspecified: Secondary | ICD-10-CM | POA: Insufficient documentation

## 2013-04-01 DIAGNOSIS — E119 Type 2 diabetes mellitus without complications: Secondary | ICD-10-CM | POA: Diagnosis not present

## 2013-04-01 DIAGNOSIS — I10 Essential (primary) hypertension: Secondary | ICD-10-CM | POA: Insufficient documentation

## 2013-04-01 DIAGNOSIS — R011 Cardiac murmur, unspecified: Secondary | ICD-10-CM

## 2013-04-01 DIAGNOSIS — C801 Malignant (primary) neoplasm, unspecified: Secondary | ICD-10-CM | POA: Diagnosis not present

## 2013-04-01 DIAGNOSIS — I080 Rheumatic disorders of both mitral and aortic valves: Secondary | ICD-10-CM | POA: Insufficient documentation

## 2013-04-01 DIAGNOSIS — Z9221 Personal history of antineoplastic chemotherapy: Secondary | ICD-10-CM | POA: Diagnosis not present

## 2013-04-01 DIAGNOSIS — I059 Rheumatic mitral valve disease, unspecified: Secondary | ICD-10-CM

## 2013-04-01 DIAGNOSIS — I4891 Unspecified atrial fibrillation: Secondary | ICD-10-CM

## 2013-04-01 NOTE — Progress Notes (Signed)
Echocardiogram performed.  

## 2013-04-07 ENCOUNTER — Telehealth: Payer: Self-pay | Admitting: Interventional Cardiology

## 2013-04-07 NOTE — Telephone Encounter (Signed)
rtc pts call 

## 2013-04-07 NOTE — Telephone Encounter (Signed)
Patient is returning your call.  

## 2013-04-24 DIAGNOSIS — E1149 Type 2 diabetes mellitus with other diabetic neurological complication: Secondary | ICD-10-CM | POA: Diagnosis not present

## 2013-04-24 DIAGNOSIS — N3941 Urge incontinence: Secondary | ICD-10-CM | POA: Diagnosis not present

## 2013-04-24 DIAGNOSIS — I10 Essential (primary) hypertension: Secondary | ICD-10-CM | POA: Diagnosis not present

## 2013-04-24 DIAGNOSIS — Z7901 Long term (current) use of anticoagulants: Secondary | ICD-10-CM | POA: Diagnosis not present

## 2013-04-24 DIAGNOSIS — E7800 Pure hypercholesterolemia, unspecified: Secondary | ICD-10-CM | POA: Diagnosis not present

## 2013-04-24 DIAGNOSIS — Z125 Encounter for screening for malignant neoplasm of prostate: Secondary | ICD-10-CM | POA: Diagnosis not present

## 2013-04-24 DIAGNOSIS — I4891 Unspecified atrial fibrillation: Secondary | ICD-10-CM | POA: Diagnosis not present

## 2013-04-24 DIAGNOSIS — H3530 Unspecified macular degeneration: Secondary | ICD-10-CM | POA: Diagnosis not present

## 2013-04-24 DIAGNOSIS — E669 Obesity, unspecified: Secondary | ICD-10-CM | POA: Diagnosis not present

## 2013-05-05 DIAGNOSIS — R351 Nocturia: Secondary | ICD-10-CM | POA: Diagnosis not present

## 2013-05-05 DIAGNOSIS — N400 Enlarged prostate without lower urinary tract symptoms: Secondary | ICD-10-CM | POA: Diagnosis not present

## 2013-05-05 DIAGNOSIS — N3941 Urge incontinence: Secondary | ICD-10-CM | POA: Diagnosis not present

## 2013-05-06 ENCOUNTER — Telehealth: Payer: Self-pay | Admitting: *Deleted

## 2013-05-06 ENCOUNTER — Ambulatory Visit (HOSPITAL_BASED_OUTPATIENT_CLINIC_OR_DEPARTMENT_OTHER): Payer: Medicare Other | Admitting: Oncology

## 2013-05-06 ENCOUNTER — Telehealth: Payer: Self-pay | Admitting: Oncology

## 2013-05-06 VITALS — BP 106/61 | HR 68 | Temp 97.0°F | Resp 18 | Ht 70.0 in | Wt 287.9 lb

## 2013-05-06 DIAGNOSIS — Z806 Family history of leukemia: Secondary | ICD-10-CM

## 2013-05-06 DIAGNOSIS — Z807 Family history of other malignant neoplasms of lymphoid, hematopoietic and related tissues: Secondary | ICD-10-CM | POA: Diagnosis not present

## 2013-05-06 DIAGNOSIS — C8589 Other specified types of non-Hodgkin lymphoma, extranodal and solid organ sites: Secondary | ICD-10-CM | POA: Diagnosis not present

## 2013-05-06 DIAGNOSIS — I4891 Unspecified atrial fibrillation: Secondary | ICD-10-CM | POA: Diagnosis not present

## 2013-05-06 DIAGNOSIS — Z801 Family history of malignant neoplasm of trachea, bronchus and lung: Secondary | ICD-10-CM

## 2013-05-06 DIAGNOSIS — Z803 Family history of malignant neoplasm of breast: Secondary | ICD-10-CM

## 2013-05-06 DIAGNOSIS — Z8042 Family history of malignant neoplasm of prostate: Secondary | ICD-10-CM | POA: Diagnosis not present

## 2013-05-06 NOTE — Telephone Encounter (Signed)
Left message on voicemail requesting pt to call office to discuss genetics referral.

## 2013-05-06 NOTE — Telephone Encounter (Signed)
lvm for pt regarding to April appt.....mailed pt appt sched/letter °

## 2013-05-06 NOTE — Progress Notes (Signed)
   [place]    OFFICE PROGRESS NOTE   INTERVAL HISTORY:   Gregory [name] returns for scheduled followup of non-Hodgkin's lymphoma. He feels well. No fever, night sweats, or anorexia. No palpable lymph nodes. He is followed closely by Dr. Sabra Heck for internal medicine care. He recently learned a paternal aunt had "lymphoma "and 2 paternal cousins may have had leukemia or lymphoma.  Objective:  Vital signs in last 24 hours:  Blood pressure 106/61, pulse 68, temperature 97 F (36.1 C), temperature source Oral, resp. rate 18, height 5\' 10"  (1.778 m), weight 287 lb 14.4 oz (130.591 kg).    HEENT: Neck without mass Lymphatics: No cervical, supraclavicular, axillary, or inguinal nodes. Prominent bilateral axillary fat pads Resp: Lungs clear bilaterally Cardio: Irregular GI: No hepatosplenomegaly, nontender, no mass Vascular: Trace to 1+ pitting pretibial edema bilaterally     Medications: I have reviewed the patient's current medications.  Assessment/Plan: 1.Non-Hodgkin lymphoma diagnosed in 04/2003, core biopsy confirmed a B-cell high-grade non-Hodgkin's lymphoma, mixed small/large cells. He remains in clinical remission.  2. Atrial fibrillation, maintained on Coumadin.  3. Family history of multiple cancers including breast, prostate, lung, leukemia, and lymphoma.   Disposition:  Gregory [name] remains in remission from non-Hodgkin's lymphoma. He has a good prognosis for a long-term disease-free survival. He plans to continue clinical followup with Dr. Sabra Heck. He was discharged from the oncology clinic today. We will be glad to see him in the future as needed.  We will refer him to the genetics screening clinic with the family history of multiple cancers.     Betsy Coder, MD  05/06/2013  1:29 PM

## 2013-05-07 NOTE — Telephone Encounter (Signed)
Spoke with pt, made him aware of genetics referral. He isn't sure if this is something he wishes to pursue. Stated most of his family is deceased. Pt will consider this, may cancel when they call him to schedule appt.

## 2013-05-14 ENCOUNTER — Telehealth: Payer: Self-pay | Admitting: Oncology

## 2013-05-14 NOTE — Telephone Encounter (Signed)
pt called pt did not want to keep appt due to insurace not covering this appt

## 2013-05-22 DIAGNOSIS — I4891 Unspecified atrial fibrillation: Secondary | ICD-10-CM | POA: Diagnosis not present

## 2013-05-22 DIAGNOSIS — Z7901 Long term (current) use of anticoagulants: Secondary | ICD-10-CM | POA: Diagnosis not present

## 2013-05-28 DIAGNOSIS — N400 Enlarged prostate without lower urinary tract symptoms: Secondary | ICD-10-CM | POA: Diagnosis not present

## 2013-05-28 DIAGNOSIS — R351 Nocturia: Secondary | ICD-10-CM | POA: Diagnosis not present

## 2013-06-04 DIAGNOSIS — J019 Acute sinusitis, unspecified: Secondary | ICD-10-CM | POA: Diagnosis not present

## 2013-06-19 DIAGNOSIS — J309 Allergic rhinitis, unspecified: Secondary | ICD-10-CM | POA: Diagnosis not present

## 2013-07-14 ENCOUNTER — Other Ambulatory Visit: Payer: Medicare Other

## 2013-07-14 ENCOUNTER — Encounter: Payer: Medicare Other | Admitting: Genetic Counselor

## 2013-08-19 DIAGNOSIS — I4891 Unspecified atrial fibrillation: Secondary | ICD-10-CM | POA: Diagnosis not present

## 2013-08-19 DIAGNOSIS — Z7901 Long term (current) use of anticoagulants: Secondary | ICD-10-CM | POA: Diagnosis not present

## 2013-08-22 DIAGNOSIS — IMO0001 Reserved for inherently not codable concepts without codable children: Secondary | ICD-10-CM | POA: Diagnosis not present

## 2013-08-26 ENCOUNTER — Encounter: Payer: Self-pay | Admitting: Interventional Cardiology

## 2013-08-26 ENCOUNTER — Ambulatory Visit (INDEPENDENT_AMBULATORY_CARE_PROVIDER_SITE_OTHER): Payer: Medicare Other | Admitting: Interventional Cardiology

## 2013-08-26 VITALS — BP 128/64 | HR 62 | Ht 70.0 in | Wt 290.0 lb

## 2013-08-26 DIAGNOSIS — I059 Rheumatic mitral valve disease, unspecified: Secondary | ICD-10-CM

## 2013-08-26 DIAGNOSIS — I4891 Unspecified atrial fibrillation: Secondary | ICD-10-CM

## 2013-08-26 DIAGNOSIS — I10 Essential (primary) hypertension: Secondary | ICD-10-CM | POA: Diagnosis not present

## 2013-08-26 DIAGNOSIS — E669 Obesity, unspecified: Secondary | ICD-10-CM

## 2013-08-26 DIAGNOSIS — E782 Mixed hyperlipidemia: Secondary | ICD-10-CM | POA: Diagnosis not present

## 2013-08-26 NOTE — Patient Instructions (Signed)
Your physician recommends that you continue on your current medications as directed. Please refer to the Current Medication list given to you today.  Your physician wants you to follow-up in: 9 months with Dr. Irish Lack. You will receive a reminder letter in the mail two months in advance. If you don't receive a letter, please call our office to schedule the follow-up appointment.  RX given to you for compression stockings.

## 2013-08-26 NOTE — Progress Notes (Signed)
Patient ID: Gregory [name], male   DOB: [birth year], 73 y.o.   MRN: [id number]    [facility], [address] [place], [place]  [address] Phone: [phone] Fax:  [phone]  Date:  08/26/2013   ID:  Gregory [name], DOB 03/12/1941, MRN [id number]  PCP:  Tawanna Solo, MD      History of Present Illness: Gregory [name] is a 73 y.o. male with mitral regurgtation. Does not walk a lot. He does do a lot of yard work. Trouble losing weight. Atrial Fibrillation F/U:  knee pain;. c/o Leg edema more at the end of the day.  Denies : Chest pain.  Dizziness.  Orthopnea.  Palpitations.  Shortness of breath.  Syncope.   Nausea with taking metformin twice a day. Better when he takes it only at night.  Wt Readings from Last 3 Encounters:  08/26/13 290 lb (131.543 kg)  05/06/13 287 lb 14.4 oz (130.591 kg)  05/06/12 293 lb 6.4 oz (133.085 kg)     Past Medical History  Diagnosis Date  . Diabetes mellitus   . Hypertension   . Ulcer   . 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 History of kidney stones   . Ruptured disk   . Other malignant lymphomas, unspecified site, extranodal and solid organ sites     NHL  . Cancer     NHL    Current Outpatient Prescriptions  Medication Sig Dispense Refill  . atorvastatin (LIPITOR) 20 MG tablet       . benazepril (LOTENSIN) 40 MG tablet Take 40 mg by mouth daily.      Marland Kitchen diltiazem (TIAZAC) 120 MG 24 hr capsule Take 120 mg by mouth daily.      . fluticasone (FLONASE) 50 MCG/ACT nasal spray       . metFORMIN (GLUCOPHAGE-XR) 500 MG 24 hr tablet Take 1,000 mg by mouth 2 (two) times daily.      . metoprolol succinate (TOPROL-XL) 100 MG 24 hr tablet Take 100 mg by mouth daily. Take with or immediately following a meal.      . Multiple Vitamins-Minerals (PRESERVISION AREDS 2 PO) Take 1 tablet by mouth daily.      . pioglitazone (ACTOS) 45 MG tablet Take 45 mg by mouth daily.      . silodosin (RAPAFLO) 8 MG CAPS capsule Take 8 mg by  mouth daily with breakfast.      . vitamin B-12 (CYANOCOBALAMIN) 1000 MCG tablet Take 1,000 mcg by mouth daily. Per PCP. 05/06/13 pt has not started yet      . warfarin (COUMADIN) 6 MG tablet Take 6 mg by mouth daily.        No current facility-administered medications for this visit.    Allergies:    Allergies  Allergen Reactions  . Bisoprolol-Hydrochlorothiazide Other (See Comments)    "Severe" arthralgias and myalgias  . Nsaids Other (See Comments)    GI upset, Peptic ulcer disease    Social History:  The patient  reports that he has never smoked. He does not have any smokeless tobacco history on file.   Family History:  The patient's family history is not on file.   ROS:  Please see the history of present illness.  No nausea, vomiting.  No fevers, chills.  No focal weakness.  No dysuria. *   All other systems reviewed and negative.   PHYSICAL EXAM: VS:  BP 128/64  Pulse 62  Ht 5\' 10"  (1.778 m)  Wt 290 lb (131.543 kg)  BMI 41.61 kg/m2 Well nourished, well developed, in no acute distress HEENT: normal Neck: no JVD, no carotid bruits Cardiac:  normal S1, S2; RRR; 2/6 systolic murmur, more intense when lying flat Lungs:  clear to auscultation bilaterally, no wheezing, rhonchi or rales Abd: soft, nontender, no hepatomegaly Ext: Bilateral lower extremity pitting edema Skin: warm and dry Neuro:   no focal abnormalities noted  EKG:  AFib, RBBB rate controlled in   8/14   ASSESSMENT AND PLAN:  Atrial fibrillation  Continue Diltiazem HCl CR Capsule Extended Release 24 Hour, 120 MG, 1 capsule, Orally, qd Continue Metoprolol Succinate Tablet Extended Release 24 Hour, 100 MG, 1 tablet, Orally, Once a day IMAGING: EKG    Overton,Shana 11/26/2012 12:00:52 PM > Fujiko Picazo,JAY 11/26/2012 12:20:12 PM > AFib, rate controlled.   Notes: Rate control. COumadin or stroke prevention.    2. Essential hypertension, benign  Continue Benazepril HCl Tablet, 40 MG, 1 tablet, Orally,  daily Notes: Controlled.     3. Obesity  Notes: He needs to work on losing weight. Try to walk more regularly. Eating nuts, but needs to watch portion size.    4. Hypercholesteremia, pure  Continue Atorvastatin Calcium Tablet, 20 MG, 1 tablet, Orally, Once a day, 90 day(s), 90, Refills 3 Notes: LDL 56. Switched to atorvastatin due to interaction between, simvastatin and diltiazem.LDL in 2013 was 47. LDL 56 in 2014.   5. Mitral regurgitation  Notes: echo in Dec 2013 showed normal LV function. mitral regurgitation did not appear as severe as it had in the past.it is technically difficult to do an echocardiogram on him due to his body habitus.    Preventive Medicine  Adult topics discussed:  Diet: low calorie, low fat, healthy diet.  Exercise: 5 days a week, at least 30 minutes of aerobic exercise.      Signed, Mina Marble, MD, [facility] 08/26/2013 2:04 PM

## 2013-08-27 DIAGNOSIS — R351 Nocturia: Secondary | ICD-10-CM | POA: Diagnosis not present

## 2013-08-27 DIAGNOSIS — N400 Enlarged prostate without lower urinary tract symptoms: Secondary | ICD-10-CM | POA: Diagnosis not present

## 2013-11-03 DIAGNOSIS — H43399 Other vitreous opacities, unspecified eye: Secondary | ICD-10-CM | POA: Diagnosis not present

## 2013-11-04 DIAGNOSIS — H40059 Ocular hypertension, unspecified eye: Secondary | ICD-10-CM | POA: Diagnosis not present

## 2013-11-19 DIAGNOSIS — Z23 Encounter for immunization: Secondary | ICD-10-CM | POA: Diagnosis not present

## 2013-11-19 DIAGNOSIS — E7800 Pure hypercholesterolemia, unspecified: Secondary | ICD-10-CM | POA: Diagnosis not present

## 2013-11-19 DIAGNOSIS — E1149 Type 2 diabetes mellitus with other diabetic neurological complication: Secondary | ICD-10-CM | POA: Diagnosis not present

## 2013-11-19 DIAGNOSIS — Z7901 Long term (current) use of anticoagulants: Secondary | ICD-10-CM | POA: Diagnosis not present

## 2013-11-19 DIAGNOSIS — Z8679 Personal history of other diseases of the circulatory system: Secondary | ICD-10-CM | POA: Diagnosis not present

## 2013-11-19 DIAGNOSIS — I10 Essential (primary) hypertension: Secondary | ICD-10-CM | POA: Diagnosis not present

## 2013-11-19 DIAGNOSIS — Z87898 Personal history of other specified conditions: Secondary | ICD-10-CM | POA: Diagnosis not present

## 2013-11-19 DIAGNOSIS — E669 Obesity, unspecified: Secondary | ICD-10-CM | POA: Diagnosis not present

## 2014-02-11 DIAGNOSIS — Z7901 Long term (current) use of anticoagulants: Secondary | ICD-10-CM | POA: Diagnosis not present

## 2014-02-11 DIAGNOSIS — Z23 Encounter for immunization: Secondary | ICD-10-CM | POA: Diagnosis not present

## 2014-03-09 DIAGNOSIS — E119 Type 2 diabetes mellitus without complications: Secondary | ICD-10-CM | POA: Diagnosis not present

## 2014-05-25 ENCOUNTER — Ambulatory Visit (INDEPENDENT_AMBULATORY_CARE_PROVIDER_SITE_OTHER): Payer: Medicare Other | Admitting: Interventional Cardiology

## 2014-05-25 ENCOUNTER — Ambulatory Visit: Payer: Medicare Other | Admitting: Interventional Cardiology

## 2014-05-25 ENCOUNTER — Encounter: Payer: Self-pay | Admitting: Interventional Cardiology

## 2014-05-25 VITALS — BP 112/64 | HR 78 | Ht 70.5 in | Wt 301.4 lb

## 2014-05-25 DIAGNOSIS — I482 Chronic atrial fibrillation, unspecified: Secondary | ICD-10-CM

## 2014-05-25 DIAGNOSIS — E669 Obesity, unspecified: Secondary | ICD-10-CM | POA: Diagnosis not present

## 2014-05-25 DIAGNOSIS — I350 Nonrheumatic aortic (valve) stenosis: Secondary | ICD-10-CM | POA: Diagnosis not present

## 2014-05-25 DIAGNOSIS — E6609 Other obesity due to excess calories: Secondary | ICD-10-CM | POA: Diagnosis not present

## 2014-05-25 DIAGNOSIS — I4891 Unspecified atrial fibrillation: Secondary | ICD-10-CM | POA: Diagnosis not present

## 2014-05-25 DIAGNOSIS — E780 Pure hypercholesterolemia: Secondary | ICD-10-CM | POA: Diagnosis not present

## 2014-05-25 DIAGNOSIS — I10 Essential (primary) hypertension: Secondary | ICD-10-CM | POA: Diagnosis not present

## 2014-05-25 DIAGNOSIS — E1140 Type 2 diabetes mellitus with diabetic neuropathy, unspecified: Secondary | ICD-10-CM | POA: Diagnosis not present

## 2014-05-25 DIAGNOSIS — I059 Rheumatic mitral valve disease, unspecified: Secondary | ICD-10-CM

## 2014-05-25 DIAGNOSIS — H3530 Unspecified macular degeneration: Secondary | ICD-10-CM | POA: Diagnosis not present

## 2014-05-25 DIAGNOSIS — Z7901 Long term (current) use of anticoagulants: Secondary | ICD-10-CM | POA: Diagnosis not present

## 2014-05-25 DIAGNOSIS — Z8572 Personal history of non-Hodgkin lymphomas: Secondary | ICD-10-CM | POA: Diagnosis not present

## 2014-05-25 NOTE — Patient Instructions (Signed)
Your physician recommends that you continue on your current medications as directed. Please refer to the Current Medication list given to you today.  Your physician has requested that you have an echocardiogram in 9 months. Echocardiography is a painless test that uses sound waves to create images of your heart. It provides your doctor with information about the size and shape of your heart and how well your heart's chambers and valves are working. This procedure takes approximately one hour. There are no restrictions for this procedure.  Your physician wants you to follow-up in: 9 months with Dr. Irish Lack.  You will receive a reminder letter in the mail two months in advance. If you don't receive a letter, please call our office to schedule the follow-up appointment.

## 2014-05-25 NOTE — Progress Notes (Signed)
Patient ID: Gregory [name], male   DOB: 1941/03/09, 74 y.o.   MRN: [id number] .jv Patient ID: Gregory [name], male   DOB: [birth year], 74 y.o.   MRN: [id number]    [address] [place], [place]  [address] Phone: [phone] Fax:  [phone]  Date:  05/25/2014   ID:  Gregory [name], DOB 22-Apr-1940, MRN [id number]  PCP:  Tawanna Solo, MD      History of Present Illness: Gregory [name] is a 74 y.o. male with mitral regurgtation and moserate to severe AS. Does not walk a lot. He does do a lot of yard work. Trouble losing weight.  Atrial Fibrillation F/U:  knee pain;. c/o Leg edema more at the end of the day.  Denies : Chest pain.  Dizziness.  Orthopnea.  Palpitations.  Shortness of breath.  Syncope.   Nausea with taking metformin twice a day. Better when he takes it only at night.  Using compression stockings as well.  He treats his leg itch with Cortisone OTC. He walks in stores sometimes.  He is limited mostly by his leg.  No sx of severe AS as noted above.  He hurt his right knee and is looking to see an ortho at [facility].  He has had prior left knee surgery.  The right knee limits his walking.  Wt Readings from Last 3 Encounters:  05/25/14 301 lb 6.4 oz (136.714 kg)  08/26/13 290 lb (131.543 kg)  05/06/13 287 lb 14.4 oz (130.591 kg)     Past Medical History  Diagnosis Date  . Diabetes mellitus   . Hypertension   . Ulcer   . 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 History of kidney stones   . Ruptured disk   . Other malignant lymphomas, unspecified site, extranodal and solid organ sites     NHL  . Cancer     NHL    Current Outpatient Prescriptions  Medication Sig Dispense Refill  . atorvastatin (LIPITOR) 20 MG tablet     . benazepril (LOTENSIN) 40 MG tablet Take 40 mg by mouth daily.    Marland Kitchen diltiazem (TIAZAC) 120 MG 24 hr capsule Take 120 mg by mouth daily.    . metFORMIN (GLUCOPHAGE-XR) 500 MG 24 hr tablet Take 1,000  mg by mouth 2 (two) times daily.    . metoprolol succinate (TOPROL-XL) 100 MG 24 hr tablet Take 100 mg by mouth daily. Take with or immediately following a meal.    . Multiple Vitamins-Minerals (PRESERVISION AREDS 2 PO) Take 1 tablet by mouth daily.    . pioglitazone (ACTOS) 45 MG tablet Take 45 mg by mouth daily.    . silodosin (RAPAFLO) 8 MG CAPS capsule Take 8 mg by mouth daily with breakfast.    . warfarin (COUMADIN) 6 MG tablet Take 6 mg by mouth daily.      No current facility-administered medications for this visit.    Allergies:    Allergies  Allergen Reactions  . Bisoprolol-Hydrochlorothiazide Other (See Comments)    "Severe" arthralgias and myalgias  . Nsaids Other (See Comments)    GI upset, Peptic ulcer disease    Social History:  The patient  reports that he has never smoked. He does not have any smokeless tobacco history on file.   Family History:  The patient's family history includes Hypertension in his father.   ROS:  Please see the history of present  illness.  No nausea, vomiting.  No fevers, chills.  No focal weakness.  No dysuria. *   All other systems reviewed and negative.   PHYSICAL EXAM: VS:  BP 112/64 mmHg  Pulse 78  Ht 5' 10.5" (1.791 m)  Wt 301 lb 6.4 oz (136.714 kg)  BMI 42.62 kg/m2 Well nourished, well developed, in no acute distress HEENT: normal Neck: no JVD, no carotid bruits Cardiac:  Irregularly irregular; RRR; 3/6 systolic murmur,  Lungs:  clear to auscultation bilaterally, no wheezing, rhonchi or rales Abd: soft, nontender, no hepatomegaly Ext: Bilateral lower extremity pitting edema Skin: warm and dry Neuro:   no focal abnormalities noted Psych: normal affect  EKG:  AFib, RBBB rate controlled , rate 84   ASSESSMENT AND PLAN:  Atrial fibrillation  Continue Diltiazem HCl CR Capsule Extended Release 24 Hour, 120 MG, 1 capsule, Orally, qd Continue Metoprolol Succinate Tablet Extended Release 24 Hour, 100 MG, 1 tablet, Orally, Once a  day IMAGING: EKG    Overton,Shana 11/26/2012 12:00:52 PM > Terriona Horlacher,JAY 11/26/2012 12:20:12 PM > AFib, rate controlled.   Notes: Rate control. COumadin or stroke prevention.    2. Essential hypertension, benign  Continue Benazepril HCl Tablet, 40 MG, 1 tablet, Orally, daily Notes: Controlled.  Avoid salt,    3. Obesity  Notes: He needs to work on losing weight. Try to walk more regularly. Eating nuts, but needs to watch portion size.  Thinks it may be related to some medicine.  His appetite is low as well.    4. Hypercholesteremia, pure  Continue Atorvastatin Calcium Tablet, 20 MG, 1 tablet, Orally, Once a day, 90 day(s), 90, Refills 3 Notes: LDL 56. Switched to atorvastatin due to interaction between, simvastatin and diltiazem.LDL in 2013 was 47. LDL 56 in 2014. Checked today by Dr. Sabra Heck.    5. Mitral regurgitation  Notes: echo in Dec 2014 showed normal LV function. mitral regurgitation did not appear as severe as it had in the past.it is technically difficult to do an echocardiogram on him due to his body habitus.   Moderate- Severe AS noted as well. Check echo in 9 months. Obtain labs from Dr. Sabra Heck.      Edema: Continue compression stockings. Preventive Medicine  Adult topics discussed:  Diet: low calorie, low fat, healthy diet.  Exercise: 5 days a week, at least 30 minutes of aerobic exercise.      Signed, Mina Marble, MD, [facility] 05/25/2014 1:47 PM

## 2014-06-01 ENCOUNTER — Ambulatory Visit (HOSPITAL_COMMUNITY): Payer: Medicare Other | Attending: Cardiovascular Disease

## 2014-06-01 DIAGNOSIS — I350 Nonrheumatic aortic (valve) stenosis: Secondary | ICD-10-CM | POA: Insufficient documentation

## 2014-06-01 NOTE — Progress Notes (Signed)
2D Echo completed. 06/01/2014

## 2014-06-09 ENCOUNTER — Telehealth: Payer: Self-pay | Admitting: *Deleted

## 2014-06-09 DIAGNOSIS — I350 Nonrheumatic aortic (valve) stenosis: Secondary | ICD-10-CM

## 2014-06-09 NOTE — Telephone Encounter (Signed)
Informed pt that Dr. Irish Lack would like for him to have follow up echo in one year.  Pt verbalized understanding and was in agreement with this plan.

## 2014-08-24 DIAGNOSIS — Z7901 Long term (current) use of anticoagulants: Secondary | ICD-10-CM | POA: Diagnosis not present

## 2014-08-24 DIAGNOSIS — I4891 Unspecified atrial fibrillation: Secondary | ICD-10-CM | POA: Diagnosis not present

## 2014-09-01 DIAGNOSIS — R351 Nocturia: Secondary | ICD-10-CM | POA: Diagnosis not present

## 2014-09-01 DIAGNOSIS — N3941 Urge incontinence: Secondary | ICD-10-CM | POA: Diagnosis not present

## 2014-09-07 DIAGNOSIS — H3531 Nonexudative age-related macular degeneration: Secondary | ICD-10-CM | POA: Diagnosis not present

## 2014-11-24 DIAGNOSIS — E6609 Other obesity due to excess calories: Secondary | ICD-10-CM | POA: Diagnosis not present

## 2014-11-24 DIAGNOSIS — Z23 Encounter for immunization: Secondary | ICD-10-CM | POA: Diagnosis not present

## 2014-11-24 DIAGNOSIS — I10 Essential (primary) hypertension: Secondary | ICD-10-CM | POA: Diagnosis not present

## 2014-11-24 DIAGNOSIS — Z7901 Long term (current) use of anticoagulants: Secondary | ICD-10-CM | POA: Diagnosis not present

## 2014-11-24 DIAGNOSIS — E1140 Type 2 diabetes mellitus with diabetic neuropathy, unspecified: Secondary | ICD-10-CM | POA: Diagnosis not present

## 2014-11-24 DIAGNOSIS — E780 Pure hypercholesterolemia: Secondary | ICD-10-CM | POA: Diagnosis not present

## 2014-11-24 DIAGNOSIS — I4891 Unspecified atrial fibrillation: Secondary | ICD-10-CM | POA: Diagnosis not present

## 2014-11-25 ENCOUNTER — Encounter: Payer: Self-pay | Admitting: Interventional Cardiology

## 2015-01-19 DIAGNOSIS — X32XXXA Exposure to sunlight, initial encounter: Secondary | ICD-10-CM | POA: Diagnosis not present

## 2015-01-19 DIAGNOSIS — L570 Actinic keratosis: Secondary | ICD-10-CM | POA: Diagnosis not present

## 2015-01-19 DIAGNOSIS — L718 Other rosacea: Secondary | ICD-10-CM | POA: Diagnosis not present

## 2015-01-19 DIAGNOSIS — I872 Venous insufficiency (chronic) (peripheral): Secondary | ICD-10-CM | POA: Diagnosis not present

## 2015-01-19 DIAGNOSIS — C44519 Basal cell carcinoma of skin of other part of trunk: Secondary | ICD-10-CM | POA: Diagnosis not present

## 2015-02-16 DIAGNOSIS — Z08 Encounter for follow-up examination after completed treatment for malignant neoplasm: Secondary | ICD-10-CM | POA: Diagnosis not present

## 2015-02-16 DIAGNOSIS — E1140 Type 2 diabetes mellitus with diabetic neuropathy, unspecified: Secondary | ICD-10-CM | POA: Diagnosis not present

## 2015-02-16 DIAGNOSIS — Z7984 Long term (current) use of oral hypoglycemic drugs: Secondary | ICD-10-CM | POA: Diagnosis not present

## 2015-02-16 DIAGNOSIS — Z7901 Long term (current) use of anticoagulants: Secondary | ICD-10-CM | POA: Diagnosis not present

## 2015-02-16 DIAGNOSIS — Z85828 Personal history of other malignant neoplasm of skin: Secondary | ICD-10-CM | POA: Diagnosis not present

## 2015-02-16 DIAGNOSIS — I872 Venous insufficiency (chronic) (peripheral): Secondary | ICD-10-CM | POA: Diagnosis not present

## 2015-02-16 DIAGNOSIS — R300 Dysuria: Secondary | ICD-10-CM | POA: Diagnosis not present

## 2015-02-21 DIAGNOSIS — L509 Urticaria, unspecified: Secondary | ICD-10-CM | POA: Diagnosis not present

## 2015-02-21 DIAGNOSIS — Z889 Allergy status to unspecified drugs, medicaments and biological substances status: Secondary | ICD-10-CM | POA: Diagnosis not present

## 2015-02-23 DIAGNOSIS — Z7901 Long term (current) use of anticoagulants: Secondary | ICD-10-CM | POA: Diagnosis not present

## 2015-02-23 DIAGNOSIS — T7840XD Allergy, unspecified, subsequent encounter: Secondary | ICD-10-CM | POA: Diagnosis not present

## 2015-03-04 ENCOUNTER — Encounter: Payer: Self-pay | Admitting: Interventional Cardiology

## 2015-03-04 ENCOUNTER — Ambulatory Visit (INDEPENDENT_AMBULATORY_CARE_PROVIDER_SITE_OTHER): Payer: Medicare Other | Admitting: Interventional Cardiology

## 2015-03-04 VITALS — BP 118/54 | HR 76 | Ht 70.5 in | Wt 295.0 lb

## 2015-03-04 DIAGNOSIS — I350 Nonrheumatic aortic (valve) stenosis: Secondary | ICD-10-CM

## 2015-03-04 DIAGNOSIS — E782 Mixed hyperlipidemia: Secondary | ICD-10-CM | POA: Diagnosis not present

## 2015-03-04 DIAGNOSIS — I482 Chronic atrial fibrillation, unspecified: Secondary | ICD-10-CM

## 2015-03-04 DIAGNOSIS — E669 Obesity, unspecified: Secondary | ICD-10-CM

## 2015-03-04 DIAGNOSIS — I059 Rheumatic mitral valve disease, unspecified: Secondary | ICD-10-CM

## 2015-03-04 DIAGNOSIS — I10 Essential (primary) hypertension: Secondary | ICD-10-CM | POA: Diagnosis not present

## 2015-03-04 NOTE — Progress Notes (Signed)
Patient ID: Gregory [name], male   DOB: 06/11/1940, 74 y.o.   MRN: [id number]     Cardiology Office Note   Date:  03/04/2015   ID:  ELYAS ARONHALT, DOB 1940-04-07, MRN [id number]  PCP:  Tawanna Solo, MD    No chief complaint on file.    Wt Readings from Last 3 Encounters:  03/04/15 295 lb (133.811 kg)  05/25/14 301 lb 6.4 oz (136.714 kg)  08/26/13 290 lb (131.543 kg)       History of Present Illness: Gregory [name] is a 74 y.o. male  with mitral regurgtation and moserate to severe AS. Does not walk a lot. He does do a lot of yard work. Losing weight due to poor appetite.  Atrial Fibrillation F/U:  knee pain;. c/o Leg edema more at the end of the day.  Denies : Chest pain.  Dizziness.  Orthopnea.  Palpitations.  Shortness of breath.  Syncope.   Nausea with taking metformin twice a day. Better when he takes it only at night.  Using compression stockings as well. Elevating legs.  He treats his leg itch with Ca creme from the dermatoligist. Diagnosed with eczema. He walks in stores sometimes. He is limited mostly by his leg. Does work in the yard.  No sx of severe AS as noted above. He hurt his right knee and thinks he needs an MRI. He has had prior left knee surgery. The right knee limits his walking.  He thinks he tore something in there.      Past Medical History  Diagnosis Date  . Diabetes mellitus   . Hypertension   . Ulcer   . 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 History of kidney stones   . Ruptured disk   . Other malignant lymphomas, unspecified site, extranodal and solid organ sites     NHL  . Cancer ([place])     NHL    Past Surgical History  Procedure Laterality Date  . Appendectomy  1967     Current Outpatient Prescriptions  Medication Sig Dispense Refill  . atorvastatin (LIPITOR) 20 MG tablet Take 20 mg by mouth daily at 6 PM.     . benazepril (LOTENSIN) 40 MG tablet Take 40 mg by mouth daily.    . clobetasol cream  (TEMOVATE) [id number] % Apply 1 application topically as needed (ITCHING).     Marland Kitchen diltiazem (TIAZAC) 120 MG 24 hr capsule Take 120 mg by mouth daily.    Marland Kitchen glimepiride (AMARYL) 4 MG tablet Take 4 mg by mouth daily with breakfast.     . metFORMIN (GLUCOPHAGE-XR) 500 MG 24 hr tablet Take 1,000 mg by mouth 2 (two) times daily.    . metoprolol succinate (TOPROL-XL) 100 MG 24 hr tablet Take 100 mg by mouth daily. Take with or immediately following a meal.    . metroNIDAZOLE (METROGEL) 0.75 % gel Apply 1 application topically as needed (ITCHING).     . Multiple Vitamins-Minerals (PRESERVISION AREDS 2 PO) Take 1 tablet by mouth daily.    . pioglitazone (ACTOS) 45 MG tablet Take 45 mg by mouth daily.    Marland Kitchen triamcinolone cream (KENALOG) 0.1 % Apply 1 application topically as needed (ITCHING).     Marland Kitchen warfarin (COUMADIN) 6 MG tablet Take 6 mg by mouth daily.      No current facility-administered medications for this visit.    Allergies:   Bisoprolol-hydrochlorothiazide and Nsaids    Social  History:  The patient  reports that he has never smoked. He does not have any smokeless tobacco history on file.   Family History:  The patient's family history includes Cancer in his mother; Heart attack in his father and mother; Hypertension in his father. There is no history of Stroke.    ROS:  Please see the history of present illness.   Otherwise, review of systems are positive for joint pains, swelling.   All other systems are reviewed and negative.    PHYSICAL EXAM: VS:  BP 118/54 mmHg  Pulse 76  Ht 5' 10.5" (1.791 m)  Wt 295 lb (133.811 kg)  BMI 41.72 kg/m2  SpO2 94% , BMI Body mass index is 41.72 kg/(m^2). GEN: Well nourished, well developed, in no acute distress HEENT: normal Neck: no JVD, carotid bruits, or masses Cardiac: irregularly irregular; 3/6 harsh systolic murmur, no rubs, or gallops, mild bilateral leg edema  Respiratory:  clear to auscultation bilaterally, normal work of breathing GI: soft,  nontender, nondistended, + BS MS: no deformity or atrophy Skin: warm and dry, no rash Neuro:  Strength and sensation are intact Psych: euthymic mood, full affect     Recent Labs: No results found for requested labs within last 365 days.   Lipid Panel No results found for: CHOL, TRIG, HDL, CHOLHDL, VLDL, LDLCALC, LDLDIRECT   Other studies Reviewed: Additional studies/ records that were reviewed today with results demonstrating: 2016 echo: moderate to severe AS.Marland Kitchen   ASSESSMENT AND PLAN:  1. AFib: Coumadin for stroke prevention. Rate controlled. 2. Aortic stenosis: moderate to severe by prior echo.  No sx at this time,.  Will repeat echo in 2017. 3. Hyperlipidemia : Continue atorvastatin.  Checked by Dr. Sabra Heck.  4. HTN: Controlled.  Continue current meds.  Avoid salt intake.  5. Obesity: Lost some weight.  Encouraged him to try to get 150 mintues of stationary bike or walking/week. 6. Mitral regurg: not as significant on the most recent echo.   Current medicines are reviewed at length with the patient today.  The patient concerns regarding his medicines were addressed.  The following changes have been made:  No change  Labs/ tests ordered today include: echo in 9 months No orders of the defined types were placed in this encounter.    Recommend 150 minutes/week of aerobic exercise Low fat, low carb, high fiber diet recommended  Disposition:   FU in 9 months   Teresita Madura., MD  03/04/2015 10:42 AM    [facility] [address], [place], [place]  [address] Phone: [phone]; Fax: [phone]

## 2015-03-04 NOTE — Patient Instructions (Signed)
Medication Instructions:  Same-no changes  Labwork: None  Testing/Procedures: Your physician has requested that you have an echocardiogram. Echocardiography is a painless test that uses sound waves to create images of your heart. It provides your doctor with information about the size and shape of your heart and how well your heart's chambers and valves are working. This procedure takes approximately one hour. There are no restrictions for this procedure. To be done just before next OV in 9 months.    Follow-Up: Your physician wants you to follow-up in: 9 months. You will receive a reminder letter in the mail two months in advance. If you don't receive a letter, please call our office to schedule the follow-up appointment.      If you need a refill on your cardiac medications before your next appointment, please call your pharmacy.

## 2015-03-09 DIAGNOSIS — E119 Type 2 diabetes mellitus without complications: Secondary | ICD-10-CM | POA: Diagnosis not present

## 2015-03-09 DIAGNOSIS — H353132 Nonexudative age-related macular degeneration, bilateral, intermediate dry stage: Secondary | ICD-10-CM | POA: Diagnosis not present

## 2015-03-16 DIAGNOSIS — Z7901 Long term (current) use of anticoagulants: Secondary | ICD-10-CM | POA: Diagnosis not present

## 2015-03-16 DIAGNOSIS — I4891 Unspecified atrial fibrillation: Secondary | ICD-10-CM | POA: Diagnosis not present

## 2015-05-27 DIAGNOSIS — I10 Essential (primary) hypertension: Secondary | ICD-10-CM | POA: Diagnosis not present

## 2015-05-27 DIAGNOSIS — I4891 Unspecified atrial fibrillation: Secondary | ICD-10-CM | POA: Diagnosis not present

## 2015-05-27 DIAGNOSIS — E7800 Pure hypercholesterolemia, unspecified: Secondary | ICD-10-CM | POA: Diagnosis not present

## 2015-05-27 DIAGNOSIS — E1140 Type 2 diabetes mellitus with diabetic neuropathy, unspecified: Secondary | ICD-10-CM | POA: Diagnosis not present

## 2015-05-27 DIAGNOSIS — Z7901 Long term (current) use of anticoagulants: Secondary | ICD-10-CM | POA: Diagnosis not present

## 2015-05-27 DIAGNOSIS — R6889 Other general symptoms and signs: Secondary | ICD-10-CM | POA: Diagnosis not present

## 2015-06-12 ENCOUNTER — Emergency Department (HOSPITAL_COMMUNITY): Payer: PPO

## 2015-06-12 ENCOUNTER — Inpatient Hospital Stay (HOSPITAL_COMMUNITY)
Admission: EM | Admit: 2015-06-12 | Discharge: 2015-06-15 | DRG: 292 | Disposition: A | Discharge status: Home / Self Care | Payer: PPO | Attending: Internal Medicine | Admitting: Internal Medicine

## 2015-06-12 ENCOUNTER — Encounter (HOSPITAL_COMMUNITY): Payer: Self-pay

## 2015-06-12 DIAGNOSIS — I350 Nonrheumatic aortic (valve) stenosis: Secondary | ICD-10-CM | POA: Diagnosis not present

## 2015-06-12 DIAGNOSIS — R55 Syncope and collapse: Secondary | ICD-10-CM | POA: Diagnosis not present

## 2015-06-12 DIAGNOSIS — I4891 Unspecified atrial fibrillation: Secondary | ICD-10-CM

## 2015-06-12 DIAGNOSIS — I110 Hypertensive heart disease with heart failure: Principal | ICD-10-CM | POA: Diagnosis present

## 2015-06-12 DIAGNOSIS — E662 Morbid (severe) obesity with alveolar hypoventilation: Secondary | ICD-10-CM | POA: Diagnosis present

## 2015-06-12 DIAGNOSIS — R0600 Dyspnea, unspecified: Secondary | ICD-10-CM | POA: Diagnosis not present

## 2015-06-12 DIAGNOSIS — I5043 Acute on chronic combined systolic (congestive) and diastolic (congestive) heart failure: Secondary | ICD-10-CM | POA: Diagnosis present

## 2015-06-12 DIAGNOSIS — I5021 Acute systolic (congestive) heart failure: Secondary | ICD-10-CM | POA: Diagnosis not present

## 2015-06-12 DIAGNOSIS — Z6841 Body Mass Index (BMI) 40.0 and over, adult: Secondary | ICD-10-CM | POA: Diagnosis not present

## 2015-06-12 DIAGNOSIS — E119 Type 2 diabetes mellitus without complications: Secondary | ICD-10-CM | POA: Diagnosis not present

## 2015-06-12 DIAGNOSIS — E669 Obesity, unspecified: Secondary | ICD-10-CM | POA: Diagnosis present

## 2015-06-12 DIAGNOSIS — E782 Mixed hyperlipidemia: Secondary | ICD-10-CM | POA: Diagnosis not present

## 2015-06-12 DIAGNOSIS — Z7901 Long term (current) use of anticoagulants: Secondary | ICD-10-CM | POA: Diagnosis not present

## 2015-06-12 DIAGNOSIS — I2510 Atherosclerotic heart disease of native coronary artery without angina pectoris: Secondary | ICD-10-CM | POA: Diagnosis present

## 2015-06-12 DIAGNOSIS — R000 Tachycardia, unspecified: Secondary | ICD-10-CM | POA: Diagnosis not present

## 2015-06-12 DIAGNOSIS — I059 Rheumatic mitral valve disease, unspecified: Secondary | ICD-10-CM | POA: Diagnosis present

## 2015-06-12 DIAGNOSIS — Z8572 Personal history of non-Hodgkin lymphomas: Secondary | ICD-10-CM | POA: Diagnosis not present

## 2015-06-12 DIAGNOSIS — E785 Hyperlipidemia, unspecified: Secondary | ICD-10-CM | POA: Diagnosis present

## 2015-06-12 DIAGNOSIS — E1169 Type 2 diabetes mellitus with other specified complication: Secondary | ICD-10-CM

## 2015-06-12 DIAGNOSIS — I10 Essential (primary) hypertension: Secondary | ICD-10-CM | POA: Diagnosis present

## 2015-06-12 DIAGNOSIS — R0602 Shortness of breath: Secondary | ICD-10-CM | POA: Diagnosis not present

## 2015-06-12 DIAGNOSIS — I5031 Acute diastolic (congestive) heart failure: Secondary | ICD-10-CM | POA: Diagnosis not present

## 2015-06-12 DIAGNOSIS — Z791 Long term (current) use of non-steroidal anti-inflammatories (NSAID): Secondary | ICD-10-CM | POA: Diagnosis not present

## 2015-06-12 LAB — BASIC METABOLIC PANEL
Anion gap: 10 (ref 5–15)
BUN: 12 mg/dL (ref 6–20)
CO2: 26 mmol/L (ref 22–32)
Calcium: 9 mg/dL (ref 8.9–10.3)
Chloride: 103 mmol/L (ref 101–111)
Creatinine, Ser: 0.95 mg/dL (ref 0.61–1.24)
GFR calc Af Amer: >60 mL/min (ref >60)
GFR calc non Af Amer: >60 mL/min (ref >60)
Glucose, Bld: 88 mg/dL (ref 65–99)
Potassium: 4.3 mmol/L (ref 3.5–5.1)
Sodium: 139 mmol/L (ref 135–145)

## 2015-06-12 LAB — CBC
HCT: 37.4 % — ABNORMAL LOW (ref 39.0–52.0)
Hemoglobin: 11.3 g/dL — ABNORMAL LOW (ref 13.0–17.0)
MCH: 26.7 pg (ref 26.0–34.0)
MCHC: 30.2 g/dL (ref 30.0–36.0)
MCV: 88.4 fL (ref 78.0–100.0)
Platelets: 237 10*3/uL (ref 150–400)
RBC: 4.23 MIL/uL (ref 4.22–5.81)
RDW: 15.9 % — ABNORMAL HIGH (ref 11.5–15.5)
WBC: 8.4 10*3/uL (ref 4.0–10.5)

## 2015-06-12 LAB — PROTIME-INR
INR: 2.13 — ABNORMAL HIGH (ref 0.00–1.49)
Prothrombin Time: 23.6 seconds — ABNORMAL HIGH (ref 11.6–15.2)

## 2015-06-12 LAB — BRAIN NATRIURETIC PEPTIDE: B Natriuretic Peptide: 129.3 pg/mL — ABNORMAL HIGH (ref 0.0–100.0)

## 2015-06-12 LAB — I-STAT TROPONIN, ED: Troponin i, poc: 0 ng/mL (ref 0.00–0.08)

## 2015-06-12 LAB — D-DIMER, QUANTITATIVE: D-Dimer, Quant: 0.39 ug/mL-FEU (ref 0.00–0.50)

## 2015-06-12 MED ORDER — DILTIAZEM HCL 25 MG/5ML IV SOLN
10.0000 mg | Freq: Once | INTRAVENOUS | Status: AC
  Administered 2015-06-12: 10 mg via INTRAVENOUS
  Filled 2015-06-12: qty 5

## 2015-06-12 MED ORDER — FUROSEMIDE 10 MG/ML IJ SOLN
40.0000 mg | Freq: Once | INTRAMUSCULAR | Status: AC
  Administered 2015-06-12: 40 mg via INTRAVENOUS
  Filled 2015-06-12: qty 4

## 2015-06-12 NOTE — ED Notes (Signed)
Pt. Reports having sob for 1 month. The last 2 days the sob has increased.  He was at [facility] this AM and when he bent over he felt like he was going to pass out.  He has bilateral edema to both legs.  He denies any chest pain.  Skin is warm and pink and dry. Alert and oriented X4.

## 2015-06-12 NOTE — ED Provider Notes (Signed)
CSN: [id number]     Arrival date & time 06/12/15  1535 History   First MD Initiated Contact with Patient 06/12/15 1845     Chief Complaint  Patient presents with  . Shortness of Breath     (Consider location/radiation/quality/duration/timing/severity/associated sxs/prior Treatment) HPI  Pt with hx of afib and moderate AS presenting with increasing shortness of breath as well as near syncopal event today.  He states that over the past several days his shortness of breath has bee worsening- worse with lying flat as well as with minimal exertion.  No chest pain.  Today he was at church and was leaning over to pick something up and felt he nearly fainted.  He also became very short of breath at that time.  He has chronic bilateral lower extremity swelling which he states is largely unchanged.  No fever/chills, no cough.  There are no other associated systemic symptoms, there are no other alleviating or modifying factors.   Past Medical History  Diagnosis Date  . Diabetes mellitus   . Hypertension   . Ulcer   . 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 History of kidney stones   . Ruptured disk   . Other malignant lymphomas, unspecified site, extranodal and solid organ sites     NHL  . Cancer ([place])     NHL   Past Surgical History  Procedure Laterality Date  . Appendectomy  1967   Family History  Problem Relation Age of Onset  . Hypertension Father   . Stroke Neg Hx   . Heart attack Mother   . Heart attack Father   . Cancer Mother     LUNG   Social History  Substance Use Topics  . Smoking status: Never Smoker   . Smokeless tobacco: None  . Alcohol Use: No    Review of Systems  ROS reviewed and all otherwise negative except for mentioned in HPI    Allergies  Bisoprolol-hydrochlorothiazide and Nsaids  Home Medications   Prior to Admission medications   Medication Sig Start Date End Date Taking? Authorizing Provider  atorvastatin (LIPITOR) 20 MG  tablet Take 20 mg by mouth daily at 6 PM.  04/22/12  Yes Historical Provider, MD  benazepril (LOTENSIN) 40 MG tablet Take 40 mg by mouth at bedtime.    Yes Historical Provider, MD  diltiazem (CARDIZEM CD) 120 MG 24 hr capsule Take 120 mg by mouth daily at 6 PM. 06/03/15  Yes Historical Provider, MD  glimepiride (AMARYL) 4 MG tablet Take 4 mg by mouth daily with breakfast.  02/19/15  Yes Historical Provider, MD  metFORMIN (GLUCOPHAGE-XR) 500 MG 24 hr tablet Take 500 mg by mouth 2 (two) times daily.    Yes Historical Provider, MD  metoprolol succinate (TOPROL-XL) 100 MG 24 hr tablet Take 100 mg by mouth daily. Take with or immediately following a meal.   Yes Historical Provider, MD  Multiple Vitamins-Minerals (PRESERVISION AREDS 2 PO) Take 1 tablet by mouth daily.   Yes Historical Provider, MD  pioglitazone (ACTOS) 45 MG tablet Take 45 mg by mouth daily.   Yes Historical Provider, MD  warfarin (COUMADIN) 6 MG tablet Take 6 mg by mouth daily at 6 PM. 3 mg on Thursday, 6 mg all other days   Yes Historical Provider, MD   BP 120/65 mmHg  Pulse 98  Temp(Src) 97.8 F (36.6 C) (Oral)  Resp 29  Ht 5\' 10"  (1.778 m)  Wt  137.128 kg  BMI 43.38 kg/m2  SpO2 94%  Vitals reviewed Physical Exam  Physical Examination: General appearance - alert, well appearing, and in no distress Mental status - alert, oriented to person, place, and time Eyes - no conjunctival injection, no scleral icterus Mouth - mucous membranes moist, pharynx normal without lesions Chest - clear to auscultation, no wheezes, rales or rhonchi, symmetric air entry, mild tachypnea with speaking Heart - normal rate, regular rhythm, normal S1, S2, no murmurs, rubs, clicks or gallops Abdomen - soft, nontender, nondistended, no masses or organomegaly Neurological - alert, oriented, normal speech Extremities - peripheral pulses normal, no pedal edema, no clubbing or cyanosis Skin - normal coloration and turgor, no rashes  ED Course  Procedures  (including critical care time) Labs Review Labs Reviewed  CBC - Abnormal; Notable for the following:    Hemoglobin 11.3 (*)    HCT 37.4 (*)    RDW 15.9 (*)    All other components within normal limits  PROTIME-INR - Abnormal; Notable for the following:    Prothrombin Time 23.6 (*)    INR 2.13 (*)    All other components within normal limits  BRAIN NATRIURETIC PEPTIDE - Abnormal; Notable for the following:    B Natriuretic Peptide 129.3 (*)    All other components within normal limits  BASIC METABOLIC PANEL  D-DIMER, QUANTITATIVE (NOT AT [facility])  Randolm Idol, ED    Imaging Review Dg Chest 2 View  06/12/2015  CLINICAL DATA:  75 year old male of with shortness breath for 1 month. Increasing shortness breath for the past 2 days. Presyncopal event this morning. Edema in both legs. EXAM: CHEST  2 VIEW COMPARISON:  Chest x-ray 06/03/2003. FINDINGS: There is cephalization of the pulmonary vasculature and slight indistinctness of the interstitial markings suggestive of mild pulmonary edema. Small left pleural effusion. Mild cardiomegaly. Atherosclerosis in the thoracic aorta. IMPRESSION: 1. The appearance the chest suggests mild congestive heart failure, as above. Electronically Signed   By: Vinnie Langton M.D.   On: 06/12/2015 18:29   I have personally reviewed and evaluated these images and lab results as part of my medical decision-making.   EKG Interpretation   Date/Time:  Saturday June 12 2015 16:11:12 EST Ventricular Rate:  97 PR Interval:    QRS Duration: 130 QT Interval:  382 QTC Calculation: 485 R Axis:   -28 Text Interpretation:  Atrial fibrillation Right bundle branch block  Abnormal ECG Since previous tracing afib and RBBB are new Confirmed by  [facility]  MD, Artice Holohan [phone]) on 06/12/2015 7:09:03 PM      MDM   Final diagnoses:  Acute systolic congestive heart failure (HCC)  Shortness of breath  Near syncope  Aortic stenosis  Atrial fibrillation, unspecified type  ([place])    Pt presenting with c/o shortness of breath worsening over the past several days.  He has hx of afib and moderate AS.  Pt started on lasix.  He is on metoprolol and diltiazem for rate control at home.  Given dose of diltiazem to help with his tachycardia in the ED.  CXR shows signs of CHF.  D-dimer negative.  11:43 PM d/w cardiology on call, he will come to see the patient in the ED.    12:31 AM d/w Alyse Low who will followup on cardiology consult- expect patient to be admitted to their service.    Alfonzo Beers, MD 06/13/15 [phone]

## 2015-06-13 ENCOUNTER — Other Ambulatory Visit (HOSPITAL_COMMUNITY): Payer: Medicare Other

## 2015-06-13 DIAGNOSIS — Z8572 Personal history of non-Hodgkin lymphomas: Secondary | ICD-10-CM | POA: Diagnosis not present

## 2015-06-13 DIAGNOSIS — I5031 Acute diastolic (congestive) heart failure: Secondary | ICD-10-CM | POA: Diagnosis not present

## 2015-06-13 DIAGNOSIS — I4891 Unspecified atrial fibrillation: Secondary | ICD-10-CM

## 2015-06-13 DIAGNOSIS — I350 Nonrheumatic aortic (valve) stenosis: Secondary | ICD-10-CM | POA: Diagnosis not present

## 2015-06-13 DIAGNOSIS — E662 Morbid (severe) obesity with alveolar hypoventilation: Secondary | ICD-10-CM | POA: Diagnosis not present

## 2015-06-13 DIAGNOSIS — R000 Tachycardia, unspecified: Secondary | ICD-10-CM | POA: Diagnosis not present

## 2015-06-13 DIAGNOSIS — E119 Type 2 diabetes mellitus without complications: Secondary | ICD-10-CM | POA: Diagnosis not present

## 2015-06-13 DIAGNOSIS — I5021 Acute systolic (congestive) heart failure: Secondary | ICD-10-CM | POA: Diagnosis not present

## 2015-06-13 DIAGNOSIS — R0600 Dyspnea, unspecified: Secondary | ICD-10-CM

## 2015-06-13 DIAGNOSIS — I2510 Atherosclerotic heart disease of native coronary artery without angina pectoris: Secondary | ICD-10-CM | POA: Diagnosis not present

## 2015-06-13 DIAGNOSIS — R0602 Shortness of breath: Secondary | ICD-10-CM | POA: Diagnosis not present

## 2015-06-13 DIAGNOSIS — Z7901 Long term (current) use of anticoagulants: Secondary | ICD-10-CM | POA: Diagnosis not present

## 2015-06-13 DIAGNOSIS — I110 Hypertensive heart disease with heart failure: Secondary | ICD-10-CM | POA: Diagnosis not present

## 2015-06-13 DIAGNOSIS — Z6841 Body Mass Index (BMI) 40.0 and over, adult: Secondary | ICD-10-CM | POA: Diagnosis not present

## 2015-06-13 DIAGNOSIS — E785 Hyperlipidemia, unspecified: Secondary | ICD-10-CM | POA: Diagnosis present

## 2015-06-13 DIAGNOSIS — I5043 Acute on chronic combined systolic (congestive) and diastolic (congestive) heart failure: Secondary | ICD-10-CM | POA: Diagnosis not present

## 2015-06-13 DIAGNOSIS — R55 Syncope and collapse: Secondary | ICD-10-CM | POA: Diagnosis not present

## 2015-06-13 DIAGNOSIS — I10 Essential (primary) hypertension: Secondary | ICD-10-CM

## 2015-06-13 DIAGNOSIS — E1169 Type 2 diabetes mellitus with other specified complication: Secondary | ICD-10-CM | POA: Diagnosis present

## 2015-06-13 DIAGNOSIS — Z791 Long term (current) use of non-steroidal anti-inflammatories (NSAID): Secondary | ICD-10-CM | POA: Diagnosis not present

## 2015-06-13 DIAGNOSIS — E782 Mixed hyperlipidemia: Secondary | ICD-10-CM | POA: Diagnosis not present

## 2015-06-13 LAB — COMPREHENSIVE METABOLIC PANEL
ALT: 16 U/L — ABNORMAL LOW (ref 17–63)
AST: 24 U/L (ref 15–41)
Albumin: 3.7 g/dL (ref 3.5–5.0)
Alkaline Phosphatase: 87 U/L (ref 38–126)
Anion gap: 13 (ref 5–15)
BUN: 12 mg/dL (ref 6–20)
CO2: 27 mmol/L (ref 22–32)
Calcium: 9 mg/dL (ref 8.9–10.3)
Chloride: 102 mmol/L (ref 101–111)
Creatinine, Ser: 1.03 mg/dL (ref 0.61–1.24)
GFR calc Af Amer: >60 mL/min (ref >60)
GFR calc non Af Amer: >60 mL/min (ref >60)
Glucose, Bld: 161 mg/dL — ABNORMAL HIGH (ref 65–99)
Potassium: 4 mmol/L (ref 3.5–5.1)
Sodium: 142 mmol/L (ref 135–145)
Total Bilirubin: 0.9 mg/dL (ref 0.3–1.2)
Total Protein: 6.5 g/dL (ref 6.5–8.1)

## 2015-06-13 LAB — CBC
HCT: 36.7 % — ABNORMAL LOW (ref 39.0–52.0)
Hemoglobin: 11.2 g/dL — ABNORMAL LOW (ref 13.0–17.0)
MCH: 26.9 pg (ref 26.0–34.0)
MCHC: 30.5 g/dL (ref 30.0–36.0)
MCV: 88.2 fL (ref 78.0–100.0)
Platelets: 233 10*3/uL (ref 150–400)
RBC: 4.16 MIL/uL — ABNORMAL LOW (ref 4.22–5.81)
RDW: 15.9 % — ABNORMAL HIGH (ref 11.5–15.5)
WBC: 8.8 10*3/uL (ref 4.0–10.5)

## 2015-06-13 LAB — GLUCOSE, CAPILLARY
Glucose-Capillary: 118 mg/dL — ABNORMAL HIGH (ref 65–99)
Glucose-Capillary: 156 mg/dL — ABNORMAL HIGH (ref 65–99)
Glucose-Capillary: 137 mg/dL — ABNORMAL HIGH (ref 65–99)
Glucose-Capillary: 137 mg/dL — ABNORMAL HIGH (ref 65–99)

## 2015-06-13 LAB — PROTIME-INR
INR: 1.96 — ABNORMAL HIGH (ref 0.00–1.49)
Prothrombin Time: 22.2 seconds — ABNORMAL HIGH (ref 11.6–15.2)

## 2015-06-13 MED ORDER — WARFARIN - PHYSICIAN DOSING INPATIENT: Freq: Every day | Status: DC

## 2015-06-13 MED ORDER — INSULIN ASPART 100 UNIT/ML ~~LOC~~ SOLN
0.0000 [IU] | Freq: Three times a day (TID) | SUBCUTANEOUS | Status: DC
  Administered 2015-06-13: 2 [IU] via SUBCUTANEOUS
  Administered 2015-06-13 – 2015-06-14 (×2): 1 [IU] via SUBCUTANEOUS
  Administered 2015-06-14: 2 [IU] via SUBCUTANEOUS
  Administered 2015-06-14: 1 [IU] via SUBCUTANEOUS
  Administered 2015-06-15: 2 [IU] via SUBCUTANEOUS

## 2015-06-13 MED ORDER — DILTIAZEM HCL ER COATED BEADS 120 MG PO CP24
120.0000 mg | ORAL_CAPSULE | Freq: Every day | ORAL | Status: DC
  Administered 2015-06-13 – 2015-06-15 (×3): 120 mg via ORAL
  Filled 2015-06-13 (×3): qty 1

## 2015-06-13 MED ORDER — FUROSEMIDE 10 MG/ML IJ SOLN
40.0000 mg | Freq: Four times a day (QID) | INTRAMUSCULAR | Status: AC
  Administered 2015-06-13 (×2): 40 mg via INTRAVENOUS
  Filled 2015-06-13 (×2): qty 4

## 2015-06-13 MED ORDER — ATORVASTATIN CALCIUM 20 MG PO TABS
20.0000 mg | ORAL_TABLET | Freq: Every day | ORAL | Status: DC
  Administered 2015-06-13 – 2015-06-15 (×3): 20 mg via ORAL
  Filled 2015-06-13 (×3): qty 1

## 2015-06-13 MED ORDER — INSULIN ASPART 100 UNIT/ML ~~LOC~~ SOLN: 0.0000 [IU] | Freq: Every day | SUBCUTANEOUS | Status: DC

## 2015-06-13 MED ORDER — METOPROLOL SUCCINATE ER 100 MG PO TB24
100.0000 mg | ORAL_TABLET | Freq: Every day | ORAL | Status: DC
  Administered 2015-06-13 – 2015-06-15 (×3): 100 mg via ORAL
  Filled 2015-06-13 (×3): qty 1

## 2015-06-13 MED ORDER — WARFARIN SODIUM 3 MG PO TABS: 3.0000 mg | ORAL_TABLET | ORAL | Status: DC

## 2015-06-13 MED ORDER — WARFARIN SODIUM 3 MG PO TABS
6.0000 mg | ORAL_TABLET | ORAL | Status: DC
  Administered 2015-06-13: 6 mg via ORAL
  Filled 2015-06-13: qty 2

## 2015-06-13 NOTE — ED Notes (Signed)
Report attempted 

## 2015-06-13 NOTE — Progress Notes (Signed)
Patient Name: Gregory [name] Date of Encounter: 06/13/2015  Active Problems:   Mitral valve disorder   Mixed hyperlipidemia   Obesity   Essential hypertension, benign   Aortic stenosis   Acute diastolic heart failure (HCC)   Atrial fibrillation with RVR ([place])   Dyspnea   Type 2 diabetes mellitus with hyperlipidemia [facility])   Primary Cardiologist: Dr Irish Lack  Patient Profile: 75 yo male w/ no prior history of CAD hx moderate AS (EF 50-55%, mean gradient 28, AVA 0.9 cm2, LA 58 mm by echo 05/2014), OSA (not on CPAP), permanent atrial fibrillation on coumadin, morbid obesity, hyperlipidemia, HTN, malignant lymphoma (in remission) who presented with SOB 03/11.   SUBJECTIVE: Breathing better, no chest pain  OBJECTIVE Filed Vitals:   06/13/15 0000 06/13/15 0030 06/13/15 0100 06/13/15 0300  BP:    122/75  Pulse: 106   93  Temp:    98.4 F (36.9 C)  TempSrc:    Oral  Resp: 15 21 22 22   Height:    5\' 11"  (1.803 m)  Weight:    296 lb 9.6 oz (134.537 kg)  SpO2: 97%   96%    Intake/Output Summary (Last 24 hours) at 06/13/15 1059 Last data filed at 06/13/15 0900  Gross per 24 hour  Intake    240 ml  Output   3700 ml  Net  -3460 ml   Filed Weights   06/12/15 1618 06/13/15 0300  Weight: 302 lb 5 oz (137.128 kg) 296 lb 9.6 oz (134.537 kg)    PHYSICAL EXAM General: Well developed, well nourished, male in no acute distress. Head: Normocephalic, atraumatic.  Neck: Supple without bruits, JVD difficult to asssess 2nd body habitus Lungs:  Resp regular and unlabored, decreased BS bases w/ a few rales. Heart: RRR, S1, S2 ?muffled, no S3, S4, 2/6 murmur; no rub. Abdomen: Soft, non-tender, non-distended, BS + x 4.  Extremities: No clubbing, cyanosis, edema.  Neuro: Alert and oriented X 3. Moves all extremities spontaneously. Psych: Normal affect.  LABS: CBC: Recent Labs  06/12/15 1615 06/13/15 0458  WBC 8.4 8.8  HGB 11.3* 11.2*  HCT 37.4* 36.7*  MCV 88.4 88.2  PLT 237  233   INR: Recent Labs  06/13/15 0458  INR [id number]*   Basic Metabolic Panel: Recent Labs  06/12/15 1615 06/13/15 0458  NA 139 142  K 4.3 4.0  CL 103 102  CO2 26 27  GLUCOSE 88 161*  BUN 12 12  CREATININE 0.95 1.03  CALCIUM 9.0 9.0   Liver Function Tests: Recent Labs  06/13/15 0458  AST 24  ALT 16*  ALKPHOS 87  BILITOT 0.9  PROT 6.5  ALBUMIN 3.7    Recent Labs  06/12/15 1710  TROPIPOC 0.00   BNP:  B NATRIURETIC PEPTIDE  Date/Time Value Ref Range Status  06/12/2015 10:30 PM 129.3* 0.0 - 100.0 pg/mL Final   D-dimer: Recent Labs  06/12/15 2230  DDIMER 0.39   TELE:  Atrial fib, RVR at times   Radiology/Studies: Dg Chest 2 View  06/12/2015  CLINICAL DATA:  75 year old male of with shortness breath for 1 month. Increasing shortness breath for the past 2 days. Presyncopal event this morning. Edema in both legs. EXAM: CHEST  2 VIEW COMPARISON:  Chest x-ray 06/03/2003. FINDINGS: There is cephalization of the pulmonary vasculature and slight indistinctness of the interstitial markings suggestive of mild pulmonary edema. Small left pleural effusion. Mild cardiomegaly. Atherosclerosis in the thoracic aorta. IMPRESSION: 1. The appearance  the chest suggests mild congestive heart failure, as above. Electronically Signed   By: Vinnie Langton M.D.   On: 06/12/2015 18:29     Current Medications:  . atorvastatin  20 mg Oral q1800  . diltiazem  120 mg Oral q1800  . insulin aspart  0-5 Units Subcutaneous QHS  . insulin aspart  0-9 Units Subcutaneous TID WC  . metoprolol succinate  100 mg Oral Daily  . [START ON 06/17/2015] warfarin  3 mg Oral Q Thu-1800  . warfarin  6 mg Oral Once per day on Sun Mon Tue Wed Fri Sat  . Warfarin - Physician Dosing Inpatient   Does not apply q1800      ASSESSMENT AND PLAN: Acute on chronic diastolic and systolic heart failure NYHA class IV>>III Secondary to aortic stenosis and atrial fibrillation with RVR Pulmonary edema on CXR,  elevated BNP Holding ACEi since borderline BP, continue metoprolol  IV diuresis, daily weights, strict I/Os, low sodium diet, fluid restriction to 1800 ml/day Keep potassium >4, calcium >9, magnesium >2 Echocardiogram ordered   Moderate aortic stenosis, mean PG 25, AVA by VTI 0.9  Exam consistent with severe AS Repeat echocardiogram   Hypertension, essential Continue home blood pressure meds Holding ACEi due to borderline BP Goal <140/90  Persistent atrial fibrillation with RVR, rate controlled, not rhythm controlled Non valvular atrial fibrillation, likely trigger is heart failure  Prior history of CHF, hypertension, diabetes, age 76, ([place] = 5, CHADS2 = 3) Maintained on coumadin for anticoagulation, LA size is severly dilated by last echo If needed, rate control started by using diltiazem drip  Checking TSH, electrolytes Keep potassium >4, calcium >9, magnesium >2 Repeat echocardiogram to evaluate for LVEF/tachycardia induced cardiomyoapthy  Diabetes mellitus with hypertension SSI, finger sticks No oral hypoglycemics HbA1c Needs nutrition consult for low sodium/ADA diet  Morbid obesity Counseled for diet/weight loss and life style interventions   Active Problems:   Mitral valve disorder   Mixed hyperlipidemia   Obesity   Essential hypertension, benign   Aortic stenosis   Acute diastolic heart failure (HCC)   Atrial fibrillation with RVR (HCC)   Dyspnea   Type 2 diabetes mellitus with hyperlipidemia ([place])   Signed, Barrett, Rhonda , PA-C 10:59 AM 06/13/2015   Attending Note:   The patient was seen and examined.  Agree with assessment and plan as noted above.  Changes made to the above note as needed.  1. Acute CHF - likely diastolic dysfunction +/- AS Doing much better after getting IV lasix .  For repeat echo today    Thayer Headings, Brooke Bonito., MD, [facility] 06/13/2015, 12:21 PM [address]  [place] Pager [phone]

## 2015-06-13 NOTE — H&P (Signed)
CARDIOLOGY INPATIENT HISTORY AND PHYSICAL EXAMINATION NOTE  Patient ID: DELOIS EPP MRN: [id number], DOB/AGE: December 19, 1940   Admit date: 06/12/2015   Primary Physician: Tawanna Solo, MD Primary Cardiologist: Casandra Doffing MD  Reason for admission: SOB  HPI: This is a 75 y.o. male with no prior history of CAD but has history of moderate AS (mean gradient 28, AVA 0.9 cm2), OSA (not on CPAP), long standing permanent atrial fibrillation on coumadin s/p DCCV, morbid obesity, hyperlipidemia, HTN, malignant lymphoma (in remission) who presented with SOB.  Patient has been having increased orthopnea, leg swelling and PND since the last one month. Today he was at church when he bent down and started feeling more SOB. Patient has bendopnea, SOB, PND, orthopnea and leg swelling. He has been getting SOB while sleeping on the right side. He felt lightheaded. He denied syncope, CHF episodes or anginal episodes in the past. He did not have any recent stress test. Last stress test in 2009 did not show reversible ischemia. He had good response to IV lasix 40 mg in the ED.    Problem List: Past Medical History  Diagnosis Date  . Diabetes mellitus   . Hypertension   . Ulcer   . 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 History of kidney stones   . Ruptured disk   . Other malignant lymphomas, unspecified site, extranodal and solid organ sites     NHL  . Cancer ([place])     NHL    Past Surgical History  Procedure Laterality Date  . Appendectomy  1967     Allergies:  Allergies  Allergen Reactions  . Bisoprolol-Hydrochlorothiazide Other (See Comments)    "Severe" arthralgias and myalgias  . Nsaids Other (See Comments)    GI upset, Peptic ulcer disease     Home Medications No current facility-administered medications for this encounter.   Current Outpatient Prescriptions  Medication Sig Dispense Refill  . atorvastatin (LIPITOR) 20 MG tablet Take 20 mg by mouth daily  at 6 PM.     . benazepril (LOTENSIN) 40 MG tablet Take 40 mg by mouth at bedtime.     Marland Kitchen diltiazem (CARDIZEM CD) 120 MG 24 hr capsule Take 120 mg by mouth daily at 6 PM.    . glimepiride (AMARYL) 4 MG tablet Take 4 mg by mouth daily with breakfast.     . metFORMIN (GLUCOPHAGE-XR) 500 MG 24 hr tablet Take 500 mg by mouth 2 (two) times daily.     . metoprolol succinate (TOPROL-XL) 100 MG 24 hr tablet Take 100 mg by mouth daily. Take with or immediately following a meal.    . Multiple Vitamins-Minerals (PRESERVISION AREDS 2 PO) Take 1 tablet by mouth daily.    . pioglitazone (ACTOS) 45 MG tablet Take 45 mg by mouth daily.    Marland Kitchen warfarin (COUMADIN) 6 MG tablet Take 6 mg by mouth daily at 6 PM. 3 mg on Thursday, 6 mg all other days       Family History  Problem Relation Age of Onset  . Hypertension Father   . Stroke Neg Hx   . Heart attack Mother   . Heart attack Father   . Cancer Mother     LUNG     Social History   Social History  . Marital Status: Single    Spouse Name: N/A  . Number of Children: N/A  . Years of Education: N/A   Occupational History  .  Not on file.   Social History Main Topics  . Smoking status: Never Smoker   . Smokeless tobacco: Not on file  . Alcohol Use: No  . Drug Use: No  . Sexual Activity: Not on file   Other Topics Concern  . Not on file   Social History Narrative     Review of Systems: General: unintentional weight gain negative for chills, fever, night sweats  Cardiovascular: see above Dermatological: negative for rash Respiratory: dyspnea negative for cough or wheezing Urologic: negative for hematuria Abdominal: negative for nausea, vomiting, diarrhea, bright red blood per rectum, melena, or hematemesis Neurologic: negative for visual changes, syncope, or dizziness Endocrine: no diabetes, no hypothyroidism Immunological: no lymph adenopathy Psych: non homicidal/suicidal  Physical Exam: Vitals: BP 120/65 mmHg  Pulse 98  Temp(Src)  97.8 F (36.6 C) (Oral)  Resp 29  Ht 5\' 10"  (1.778 m)  Wt 137.128 kg (302 lb 5 oz)  BMI 43.38 kg/m2  SpO2 94% General: not in acute distress Neck: JVP elevated Heart: irregular rate and rhythm, S1, systolic grade II/VI murmur present in aortic region, S2 is diminished  Lungs: bilateral crackles GI: non tender, non distended, bowel sounds present Extremities: 2+ bilateral edema Neuro: AAO x 3  Psych: normal affect, no anxiety   Labs:   Results for orders placed or performed during the hospital encounter of 06/12/15 (from the past 24 hour(s))  Basic metabolic panel     Status: None   Collection Time: 06/12/15  4:15 PM  Result Value Ref Range   Sodium 139 135 - 145 mmol/L   Potassium 4.3 3.5 - 5.1 mmol/L   Chloride 103 101 - 111 mmol/L   CO2 26 22 - 32 mmol/L   Glucose, Bld 88 65 - 99 mg/dL   BUN 12 6 - 20 mg/dL   Creatinine, Ser 0.95 0.61 - 1.24 mg/dL   Calcium 9.0 8.9 - 10.3 mg/dL   GFR calc non Af Amer >60 >60 mL/min   GFR calc Af Amer >60 >60 mL/min   Anion gap 10 5 - 15  CBC     Status: Abnormal   Collection Time: 06/12/15  4:15 PM  Result Value Ref Range   WBC 8.4 4.0 - 10.5 K/uL   RBC 4.23 4.22 - 5.81 MIL/uL   Hemoglobin 11.3 (L) 13.0 - 17.0 g/dL   HCT 37.4 (L) 39.0 - 52.0 %   MCV 88.4 78.0 - 100.0 fL   MCH 26.7 26.0 - 34.0 pg   MCHC 30.2 30.0 - 36.0 g/dL   RDW 15.9 (H) 11.5 - 15.5 %   Platelets 237 150 - 400 K/uL  Protime-INR - (order if Patient is taking Coumadin / Warfarin)     Status: Abnormal   Collection Time: 06/12/15  4:15 PM  Result Value Ref Range   Prothrombin Time 23.6 (H) 11.6 - 15.2 seconds   INR 2.13 (H) 0.00 - 1.49  I-stat troponin, ED (not at [facility])     Status: None   Collection Time: 06/12/15  5:10 PM  Result Value Ref Range   Troponin i, poc 0.00 0.00 - 0.08 ng/mL   Comment 3          Brain natriuretic peptide     Status: Abnormal   Collection Time: 06/12/15 10:30 PM  Result Value Ref Range   B Natriuretic Peptide 129.3 (H) 0.0 -  100.0 pg/mL  D-dimer, quantitative (not at [facility])     Status: None   Collection Time: 06/12/15  10:30 PM  Result Value Ref Range   D-Dimer, Quant 0.39 0.00 - 0.50 ug/mL-FEU     Radiology/Studies: Dg Chest 2 View  06/12/2015  CLINICAL DATA:  75 year old male of with shortness breath for 1 month. Increasing shortness breath for the past 2 days. Presyncopal event this morning. Edema in both legs. EXAM: CHEST  2 VIEW COMPARISON:  Chest x-ray 06/03/2003. FINDINGS: There is cephalization of the pulmonary vasculature and slight indistinctness of the interstitial markings suggestive of mild pulmonary edema. Small left pleural effusion. Mild cardiomegaly. Atherosclerosis in the thoracic aorta. IMPRESSION: 1. The appearance the chest suggests mild congestive heart failure, as above. Electronically Signed   By: Vinnie Langton M.D.   On: 06/12/2015 18:29    EKG: atrial fibrillation with rapid ventricular rate   Echo: 06/01/2014 - Left ventricle: The cavity size was normal. Wall thickness was normal. Systolic function was normal. The estimated ejection fraction was in the range of 50% to 55%. Wall motion was normal; there were no regional wall motion abnormalities. - Aortic valve: There was moderate stenosis. Valve area (VTI): 0.89 cm^2. Valve area (Vmean): 0.88 cm^2. - Mitral valve: The findings are consistent with moderate stenosis. Valve area by pressure half-time: 2.44 cm^2. Valve area by continuity equation (using LVOT flow): 1.37 cm^2. - Left atrium: The atrium was severely dilated. - Right atrium: The atrium was moderately dilated. - Pulmonary arteries: Systolic pressure was moderately increased. PA peak pressure: 50 mm Hg (S).   Cardiac cath: not performed  Medical decision making:  Discussed care with the patient Discussed care with the physician on the phone Reviewed labs and imaging personally Reviewed prior records  ASSESSMENT AND PLAN:  This is a 75 y.o. male with  no prior history of CAD but has history of moderate AS (mean gradient 28, AVA 0.9 cm2), OSA (not on CPAP), long standing permanent atrial fibrillation on coumadin s/p DCCV, morbid obesity, hyperlipidemia, HTN, malignant lymphoma (in remission) who presented with SOB and was found to have hemodynamic and clinical congestion.  This is his first admission with CHF.    Active Problems:   Mitral valve disorder   Mixed hyperlipidemia   Obesity   Essential hypertension, benign   Aortic stenosis   Acute diastolic heart failure (HCC)   Atrial fibrillation with RVR (HCC)   Dyspnea   Acute on chronic diastolic and systolic heart failure NYHA class IV Secondary to aortic stenosis and atrial fibrillation with RVR Pulmonary edema on CXR, elevated BNP Holding ACEi since borderline BP, will continue metoprolol  IV diuresis, discussed need for daily weights, strict I/Os, low sodium diet, fluid restriction to 1800 ml/day Rhythm control  TSH checked Keep potassium >4, calcium >9, magnesium >2 Echocardiogram ordered   Moderate aortic stenosis, mean PG 25, AVA by VTI 0.9  Exam consistent with severe AS Repeat echocardiogram   Hypertension, essential Continue home blood pressure meds Holding ACEi due to borderline BP Goal <140/90  Persistent atrial fibrillation with RVR, rate controlled, not rhythm controlled Non valvular atrial fibrillation, likely trigger is heart failure   Prior history of CHF, hypertension, diabetes, age 41, ([place] = 5, CHADS2 = 3) Maintained on coumadin for anticoagulation, LA size is severly dilated by last echo If needed, rate control started by using diltiazem drip  Checking TSH, electrolytes Keep potassium >4, calcium >9, magnesium >2 Repeat echocardiogram to evaluate for LVEF/tachycardia induced cardiomyoapthy  Diabetes mellitus with hypertension SSI, finger sticks No oral hypoglycemics HbA1c  Morbid obesity Counseled for  diet/weight loss and life style  interventions    Signed, Flossie Dibble, MD MS 06/13/2015, 12:55 AM

## 2015-06-13 NOTE — Progress Notes (Signed)
Utilization review completed.  

## 2015-06-14 ENCOUNTER — Inpatient Hospital Stay (HOSPITAL_COMMUNITY): Payer: PPO

## 2015-06-14 ENCOUNTER — Other Ambulatory Visit: Payer: Self-pay

## 2015-06-14 DIAGNOSIS — R0600 Dyspnea, unspecified: Secondary | ICD-10-CM

## 2015-06-14 DIAGNOSIS — I059 Rheumatic mitral valve disease, unspecified: Secondary | ICD-10-CM

## 2015-06-14 LAB — TSH: TSH: 1.949 u[IU]/mL (ref 0.350–4.500)

## 2015-06-14 LAB — ECHOCARDIOGRAM COMPLETE
Height: 71 in
Weight: 4571.46 oz

## 2015-06-14 LAB — GLUCOSE, CAPILLARY
Glucose-Capillary: 124 mg/dL — ABNORMAL HIGH (ref 65–99)
Glucose-Capillary: 177 mg/dL — ABNORMAL HIGH (ref 65–99)
Glucose-Capillary: 136 mg/dL — ABNORMAL HIGH (ref 65–99)
Glucose-Capillary: 113 mg/dL — ABNORMAL HIGH (ref 65–99)

## 2015-06-14 LAB — BASIC METABOLIC PANEL
Anion gap: 11 (ref 5–15)
BUN: 11 mg/dL (ref 6–20)
CO2: 28 mmol/L (ref 22–32)
Calcium: 8.9 mg/dL (ref 8.9–10.3)
Chloride: 101 mmol/L (ref 101–111)
Creatinine, Ser: 0.86 mg/dL (ref 0.61–1.24)
GFR calc Af Amer: >60 mL/min (ref >60)
GFR calc non Af Amer: >60 mL/min (ref >60)
Glucose, Bld: 120 mg/dL — ABNORMAL HIGH (ref 65–99)
Potassium: 3.4 mmol/L — ABNORMAL LOW (ref 3.5–5.1)
Sodium: 140 mmol/L (ref 135–145)

## 2015-06-14 LAB — PROTIME-INR
INR: 1.73 — ABNORMAL HIGH (ref 0.00–1.49)
Prothrombin Time: 20.3 seconds — ABNORMAL HIGH (ref 11.6–15.2)

## 2015-06-14 MED ORDER — WARFARIN SODIUM 3 MG PO TABS
9.0000 mg | ORAL_TABLET | Freq: Once | ORAL | Status: AC
  Administered 2015-06-14: 9 mg via ORAL
  Filled 2015-06-14: qty 3

## 2015-06-14 MED ORDER — WARFARIN - PHARMACIST DOSING INPATIENT
Freq: Every day | Status: DC
Start: 2015-06-14 — End: 2015-06-15
  Administered 2015-06-14 – 2015-06-15 (×2)

## 2015-06-14 MED ORDER — FUROSEMIDE 40 MG PO TABS
40.0000 mg | ORAL_TABLET | Freq: Every day | ORAL | Status: DC
  Administered 2015-06-14 – 2015-06-15 (×2): 40 mg via ORAL
  Filled 2015-06-14 (×2): qty 1

## 2015-06-14 NOTE — Progress Notes (Signed)
CARDIAC REHAB PHASE I   PRE:  Rate/Rhythm: 89-93 afib  BP:  Supine:   Sitting: 122/69  Standing:    SaO2: 97%RA  MODE:  Ambulation: 420 ft   POST:  Rate/Rhythm: 112 afib  BP:  Supine:   Sitting: 126/63  Standing:    SaO2: 97%RA 1430-1520 Pt walked 420 ft with slow steady gait. Stopped to rest due to right knee discomfort but not SOB. He stated his breathing was much better but he did look a little SOB to me(thiscould be normal for pt to have a little SOB). Gave pt CHF booklet and reviewed zones. Pt does not have scales but thought someone was getting him some here. If not, encouraged pt to get and weigh daily. Discussed when to call MD with weight gain and signs and symptoms. Gave low sodium handouts and reviewed 2000 mg restriction and 2L FR. Encouraged pt to read materials.   Graylon Good, RN BSN  06/14/2015 3:16 PM

## 2015-06-14 NOTE — Consult Note (Signed)
   [facility] Inpatient Consult   06/14/2015  KAWLIGA CRUMITY 29-Nov-1940 [id number] Patient evaluated for community based chronic disease management services with [place] [facility] as a benefit of patient's [employer] Medicare Insurance. Spoke with patient at bedside to explain [place] [facility] services.  Patient endorses that Dr. Kem Parkinson is his primary care provider.  Patient states he does not have a scale that he weighs on and would like more information about a low sodium diet and symptom management.  Consent form signed.  Patient will receive post hospital discharge call and will be evaluated for monthly home visits for assessments and disease process education.  Left contact information and [facility] literature at bedside. Made Inpatient Case Manager aware that [facility] Management following. Of note, [facility] Care Management services does not replace or interfere with any services that are arranged by inpatient case management or social work.  For additional questions or referrals please contact:   Natividad Brood, RN BSN [facility] Liaison  [phone] business mobile phone Toll free office [phone]

## 2015-06-14 NOTE — Progress Notes (Signed)
  Echocardiogram 2D Echocardiogram has been performed.  Donata Clay 06/14/2015, 12:48 PM

## 2015-06-14 NOTE — Progress Notes (Signed)
ANTICOAGULATION CONSULT NOTE - Initial Consult  Pharmacy Consult for warfarin Indication: atrial fibrillation  Allergies  Allergen Reactions  . Bisoprolol-Hydrochlorothiazide Other (See Comments)    "Severe" arthralgias and myalgias  . Nsaids Other (See Comments)    GI upset, Peptic ulcer disease    Patient Measurements: Height: 5\' 11"  (180.3 cm) Weight: 285 lb 11.5 oz (129.6 kg) IBW/kg (Calculated) : 75.3 Heparin Dosing Weight: n/a  Vital Signs: Temp: 98.3 F (36.8 C) (03/13 1223) Temp Source: Oral (03/13 1223) BP: 102/39 mmHg (03/13 1223) Pulse Rate: 92 (03/13 1223)  Labs:  Recent Labs  06/12/15 1615 06/13/15 0458 06/14/15 0455  HGB 11.3* 11.2*  --   HCT 37.4* 36.7*  --   PLT 237 233  --   LABPROT 23.6* 22.2* 20.3*  INR 2.13* 1.96* 1.73*  CREATININE 0.95 1.03 0.86    Estimated Creatinine Clearance: 101.8 mL/min (by C-G formula based on Cr of 0.86).   Medical History: Past Medical History  Diagnosis Date  . Diabetes mellitus   . Hypertension   . Ulcer   . 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 History of kidney stones   . Ruptured disk   . Other malignant lymphomas, unspecified site, extranodal and solid organ sites     NHL  . Cancer [facility])     NHL     Assessment: 75 yo male on chronic Coumadin PTA for afib. Home dose 6mg  daily except for 3mg  on Thur. MD continuing home dose.   Last home dose 3/10, no Coumadin ordered 3/11, resumed 3/12  03/13 INR > 1.73  Goal of Therapy:   INR 2-3 Monitor platelets by anticoagulation protocol: Yes   Plan:  1. Coumadin 9 mg x1. 2. Recommend resuming PTA dose at discharge.  Uvaldo Rising, BCPS  Clinical Pharmacist Pager [phone]  06/14/2015 2:37 PM

## 2015-06-14 NOTE — Progress Notes (Signed)
Subjective:  Feeling much better after diuresis. No CP/SOB   obective:  Temp:  [97.4 F (36.3 C)-98.4 F (36.9 C)] 98.3 F (36.8 C) (03/13 1223) Pulse Rate:  [87-92] 92 (03/13 1223) Resp:  [18-19] 18 (03/13 1223) BP: (101-129)/(39-75) 102/39 mmHg (03/13 1223) SpO2:  [93 %-98 %] 95 % (03/13 1223) Weight:  [285 lb 11.5 oz (129.6 kg)] 285 lb 11.5 oz (129.6 kg) (03/13 0500) Weight change: -16 lb 9.5 oz (-7.528 kg)  Intake/Output from previous day: 03/12 0701 - 03/13 0700 In: 1162 [P.O.:1162] Out: 3650 [Urine:3650]  Intake/Output from this shift: Total I/O In: 360 [P.O.:360] Out: 125 [Urine:125]  Physical Exam: General appearance: alert and no distress Neck: no adenopathy, no carotid bruit, no JVD, supple, symmetrical, trachea midline and thyroid not enlarged, symmetric, no tenderness/mass/nodules Lungs: clear to auscultation bilaterally Heart: irregularly irregular rhythm Extremities: extremities normal, atraumatic, no cyanosis or edema  Lab Results: Results for orders placed or performed during the hospital encounter of 06/12/15 (from the past 48 hour(s))  Basic metabolic panel     Status: None   Collection Time: 06/12/15  4:15 PM  Result Value Ref Range   Sodium 139 135 - 145 mmol/L   Potassium 4.3 3.5 - 5.1 mmol/L   Chloride 103 101 - 111 mmol/L   CO2 26 22 - 32 mmol/L   Glucose, Bld 88 65 - 99 mg/dL   BUN 12 6 - 20 mg/dL   Creatinine, Ser 0.95 0.61 - 1.24 mg/dL   Calcium 9.0 8.9 - 10.3 mg/dL   GFR calc non Af Amer >60 >60 mL/min   GFR calc Af Amer >60 >60 mL/min    Comment: (NOTE) The eGFR has been calculated using the CKD EPI equation. This calculation has not been validated in all clinical situations. eGFR's persistently <60 mL/min signify possible Chronic Kidney Disease.    Anion gap 10 5 - 15  CBC     Status: Abnormal   Collection Time: 06/12/15  4:15 PM  Result Value Ref Range   WBC 8.4 4.0 - 10.5 K/uL   RBC 4.23 4.22 - 5.81 MIL/uL   Hemoglobin 11.3 (L) 13.0 - 17.0 g/dL   HCT 37.4 (L) 39.0 - 52.0 %   MCV 88.4 78.0 - 100.0 fL   MCH 26.7 26.0 - 34.0 pg   MCHC 30.2 30.0 - 36.0 g/dL   RDW 15.9 (H) 11.5 - 15.5 %   Platelets 237 150 - 400 K/uL  Protime-INR - (order if Patient is taking Coumadin / Warfarin)     Status: Abnormal   Collection Time: 06/12/15  4:15 PM  Result Value Ref Range   Prothrombin Time 23.6 (H) 11.6 - 15.2 seconds   INR 2.13 (H) 0.00 - 1.49  I-stat troponin, ED (not at [facility])     Status: None   Collection Time: 06/12/15  5:10 PM  Result Value Ref Range   Troponin i, poc 0.00 0.00 - 0.08 ng/mL   Comment 3            Comment: Due to the release kinetics of cTnI, a negative result within the first hours of the onset of symptoms does not rule out myocardial infarction with certainty. If myocardial infarction is still suspected, repeat the test at appropriate intervals.   Brain natriuretic peptide     Status: Abnormal   Collection Time: 06/12/15 10:30 PM  Result Value Ref Range   B Natriuretic Peptide 129.3 (H) 0.0 - 100.0 pg/mL  D-dimer,  quantitative (not at [facility])     Status: None   Collection Time: 06/12/15 10:30 PM  Result Value Ref Range   D-Dimer, Quant 0.39 0.00 - 0.50 ug/mL-FEU    Comment: (NOTE) At the manufacturer cut-off of 0.50 ug/mL FEU, this assay has been documented to exclude PE with a sensitivity and negative predictive value of 97 to 99%.  At this time, this assay has not been approved by the FDA to exclude DVT/VTE. Results should be correlated with clinical presentation.   Comprehensive metabolic panel     Status: Abnormal   Collection Time: 06/13/15  4:58 AM  Result Value Ref Range   Sodium 142 135 - 145 mmol/L   Potassium 4.0 3.5 - 5.1 mmol/L   Chloride 102 101 - 111 mmol/L   CO2 27 22 - 32 mmol/L   Glucose, Bld 161 (H) 65 - 99 mg/dL   BUN 12 6 - 20 mg/dL   Creatinine, Ser 1.03 0.61 - 1.24 mg/dL   Calcium 9.0 8.9 - 10.3 mg/dL   Total Protein 6.5 6.5 - 8.1 g/dL    Albumin 3.7 3.5 - 5.0 g/dL   AST 24 15 - 41 U/L   ALT 16 (L) 17 - 63 U/L   Alkaline Phosphatase 87 38 - 126 U/L   Total Bilirubin 0.9 0.3 - 1.2 mg/dL   GFR calc non Af Amer >60 >60 mL/min   GFR calc Af Amer >60 >60 mL/min    Comment: (NOTE) The eGFR has been calculated using the CKD EPI equation. This calculation has not been validated in all clinical situations. eGFR's persistently <60 mL/min signify possible Chronic Kidney Disease.    Anion gap 13 5 - 15  CBC     Status: Abnormal   Collection Time: 06/13/15  4:58 AM  Result Value Ref Range   WBC 8.8 4.0 - 10.5 K/uL   RBC 4.16 (L) 4.22 - 5.81 MIL/uL   Hemoglobin 11.2 (L) 13.0 - 17.0 g/dL   HCT 36.7 (L) 39.0 - 52.0 %   MCV 88.2 78.0 - 100.0 fL   MCH 26.9 26.0 - 34.0 pg   MCHC 30.5 30.0 - 36.0 g/dL   RDW 15.9 (H) 11.5 - 15.5 %   Platelets 233 150 - 400 K/uL  Protime-INR     Status: Abnormal   Collection Time: 06/13/15  4:58 AM  Result Value Ref Range   Prothrombin Time 22.2 (H) 11.6 - 15.2 seconds   INR 1.96 (H) 0.00 - 1.49  Glucose, capillary     Status: Abnormal   Collection Time: 06/13/15  8:17 AM  Result Value Ref Range   Glucose-Capillary 118 (H) 65 - 99 mg/dL  Glucose, capillary     Status: Abnormal   Collection Time: 06/13/15 11:17 AM  Result Value Ref Range   Glucose-Capillary 156 (H) 65 - 99 mg/dL  Glucose, capillary     Status: Abnormal   Collection Time: 06/13/15  4:41 PM  Result Value Ref Range   Glucose-Capillary 137 (H) 65 - 99 mg/dL  Glucose, capillary     Status: Abnormal   Collection Time: 06/13/15  9:31 PM  Result Value Ref Range   Glucose-Capillary 137 (H) 65 - 99 mg/dL  Protime-INR     Status: Abnormal   Collection Time: 06/14/15  4:55 AM  Result Value Ref Range   Prothrombin Time 20.3 (H) 11.6 - 15.2 seconds   INR 1.73 (H) 0.00 - 3.66  Basic metabolic panel     Status: Abnormal  Collection Time: 06/14/15  4:55 AM  Result Value Ref Range   Sodium 140 135 - 145 mmol/L   Potassium 3.4 (L) 3.5  - 5.1 mmol/L   Chloride 101 101 - 111 mmol/L   CO2 28 22 - 32 mmol/L   Glucose, Bld 120 (H) 65 - 99 mg/dL   BUN 11 6 - 20 mg/dL   Creatinine, Ser 0.86 0.61 - 1.24 mg/dL   Calcium 8.9 8.9 - 10.3 mg/dL   GFR calc non Af Amer >60 >60 mL/min   GFR calc Af Amer >60 >60 mL/min    Comment: (NOTE) The eGFR has been calculated using the CKD EPI equation. This calculation has not been validated in all clinical situations. eGFR's persistently <60 mL/min signify possible Chronic Kidney Disease.    Anion gap 11 5 - 15  TSH     Status: None   Collection Time: 06/14/15  5:42 AM  Result Value Ref Range   TSH 1.949 0.350 - 4.500 uIU/mL  Glucose, capillary     Status: Abnormal   Collection Time: 06/14/15  7:52 AM  Result Value Ref Range   Glucose-Capillary 124 (H) 65 - 99 mg/dL  Glucose, capillary     Status: Abnormal   Collection Time: 06/14/15 11:35 AM  Result Value Ref Range   Glucose-Capillary 177 (H) 65 - 99 mg/dL   Comment 1 Notify RN     Imaging: Imaging results have been reviewed  Tele- Afib with VR 90-100 (I have personally reviewed)  Assessment/Plan:   1. Active Problems: 2.   Mitral valve disorder 3.   Mixed hyperlipidemia 4.   Obesity 5.   Essential hypertension, benign 6.   Aortic stenosis 7.   Acute diastolic heart failure ([place]) 8.   Atrial fibrillation with RVR ([place]) 9.   Dyspnea 10.   Type 2 diabetes mellitus with hyperlipidemia ([place]) 11.   Time Spent Directly with Patient:  20 minutes  Length of Stay:  LOS: 1 day   Pt admitted with Vol overload, DDyysf. He has mod AS and severe LAE with CAF on Coumadin AC. He is not on an oral diuretic at home. He has diuresed 6 L and feels at baseline. INR is sub therapeutic. Will get Pharm consult and [place] consult. Begin lasix 40 mg daily PO. Follow labs. Prob home AM.  Quay Burow 06/14/2015, 2:17 PM

## 2015-06-15 ENCOUNTER — Telehealth: Payer: Self-pay | Admitting: Interventional Cardiology

## 2015-06-15 LAB — BASIC METABOLIC PANEL
Anion gap: 12 (ref 5–15)
Anion gap: 12 (ref 5–15)
BUN: 13 mg/dL (ref 6–20)
BUN: 17 mg/dL (ref 6–20)
CO2: 30 mmol/L (ref 22–32)
CO2: 27 mmol/L (ref 22–32)
Calcium: 9 mg/dL (ref 8.9–10.3)
Calcium: 9 mg/dL (ref 8.9–10.3)
Chloride: 100 mmol/L — ABNORMAL LOW (ref 101–111)
Chloride: 98 mmol/L — ABNORMAL LOW (ref 101–111)
Creatinine, Ser: 0.99 mg/dL (ref 0.61–1.24)
Creatinine, Ser: 1.07 mg/dL (ref 0.61–1.24)
GFR calc Af Amer: >60 mL/min (ref >60)
GFR calc Af Amer: >60 mL/min (ref >60)
GFR calc non Af Amer: >60 mL/min (ref >60)
GFR calc non Af Amer: >60 mL/min (ref >60)
Glucose, Bld: 107 mg/dL — ABNORMAL HIGH (ref 65–99)
Glucose, Bld: 238 mg/dL — ABNORMAL HIGH (ref 65–99)
Potassium: 3.6 mmol/L (ref 3.5–5.1)
Potassium: 4.1 mmol/L (ref 3.5–5.1)
Sodium: 142 mmol/L (ref 135–145)
Sodium: 137 mmol/L (ref 135–145)

## 2015-06-15 LAB — PROTIME-INR
INR: 1.7 — ABNORMAL HIGH (ref 0.00–1.49)
Prothrombin Time: 20 seconds — ABNORMAL HIGH (ref 11.6–15.2)

## 2015-06-15 LAB — GLUCOSE, CAPILLARY
Glucose-Capillary: 106 mg/dL — ABNORMAL HIGH (ref 65–99)
Glucose-Capillary: 152 mg/dL — ABNORMAL HIGH (ref 65–99)
Glucose-Capillary: 206 mg/dL — ABNORMAL HIGH (ref 65–99)

## 2015-06-15 LAB — HEMOGLOBIN A1C
Hgb A1c MFr Bld: 6.9 % — ABNORMAL HIGH (ref 4.8–5.6)
Mean Plasma Glucose: 151 mg/dL

## 2015-06-15 LAB — BRAIN NATRIURETIC PEPTIDE: B Natriuretic Peptide: 106.6 pg/mL — ABNORMAL HIGH (ref 0.0–100.0)

## 2015-06-15 MED ORDER — POTASSIUM CHLORIDE ER 20 MEQ PO TBCR: 20.0000 meq | EXTENDED_RELEASE_TABLET | Freq: Every day | ORAL | Status: DC

## 2015-06-15 MED ORDER — WARFARIN SODIUM 3 MG PO TABS
9.0000 mg | ORAL_TABLET | Freq: Once | ORAL | Status: AC
  Administered 2015-06-15: 9 mg via ORAL
  Filled 2015-06-15 (×2): qty 3

## 2015-06-15 MED ORDER — INSULIN ASPART 100 UNIT/ML ~~LOC~~ SOLN
0.0000 [IU] | Freq: Three times a day (TID) | SUBCUTANEOUS | Status: DC
  Administered 2015-06-15: 3 [IU] via SUBCUTANEOUS

## 2015-06-15 MED ORDER — FUROSEMIDE 40 MG PO TABS: 40.0000 mg | ORAL_TABLET | Freq: Every day | ORAL | Status: DC

## 2015-06-15 NOTE — Discharge Instructions (Signed)
Heart Failure prevention plan:  1. Avoid salt  2. Limit daily fluid intake <2 L per day  3. Weigh yourself daily, call cardiology if weight increase by more than 3 lbs overnight or 5 lbs in a single week.

## 2015-06-15 NOTE — Progress Notes (Signed)
ANTICOAGULATION CONSULT NOTE - Initial Consult  Pharmacy Consult for warfarin Indication: atrial fibrillation  Patient Measurements: Height: 5\' 11"  (180.3 cm) Weight: 281 lb 11.2 oz (127.778 kg) (scale b) IBW/kg (Calculated) : 75.3  Vital Signs: Temp: 97.8 F (36.6 C) (03/14 0515) Temp Source: Oral (03/14 0515) BP: 108/58 mmHg (03/14 0515) Pulse Rate: 85 (03/14 0515)  Labs:  Recent Labs  06/12/15 1615 06/13/15 0458 06/14/15 0455 06/15/15 0332  HGB 11.3* 11.2*  --   --   HCT 37.4* 36.7*  --   --   PLT 237 233  --   --   LABPROT 23.6* 22.2* 20.3* 20.0*  INR 2.13* 1.96* 1.73* 1.70*  CREATININE 0.95 1.03 0.86 0.99    Estimated Creatinine Clearance: 87.8 mL/min (by C-G formula based on Cr of 0.99).    Assessment: 75 yo male on chronic Coumadin PTA for afib. Home dose 6mg  daily except for 3mg  on Thur. MD continuing home dose. Last home dose 3/10, no Coumadin ordered 3/11, resumed 3/12  03/14 INR - 1.70  < 1.73  Goal of Therapy:   INR 2-3 Monitor platelets by anticoagulation protocol: Yes   Plan:  1. Coumadin 9 mg x1 this evening. 2. Recommend resuming PTA dose at discharge.  Vincenza Hews, PharmD, BCPS 06/15/2015, 8:47 AM Pager: [phone]

## 2015-06-15 NOTE — Discharge Summary (Signed)
Discharge Summary    Patient ID: Gregory [name],  MRN: [id number], DOB/AGE: July 13, 1940 75 y.o.  Admit date: 06/12/2015 Discharge date: 06/15/2015  Primary Care Provider: Tawanna Solo Primary Cardiologist: Dr. Irish Lack  Discharge Diagnoses    Principal Problem:   Acute diastolic heart failure [facility]) Active Problems:   Mitral valve disorder   Mixed hyperlipidemia   Obesity   Essential hypertension, benign   Aortic stenosis   Atrial fibrillation with RVR ([facility])   Dyspnea   Type 2 diabetes mellitus with hyperlipidemia ([facility])   Allergies Allergies  Allergen Reactions  . Bisoprolol-Hydrochlorothiazide Other (See Comments)    "Severe" arthralgias and myalgias  . Nsaids Other (See Comments)    GI upset, Peptic ulcer disease    Diagnostic Studies/Procedures    Echo 06/14/2015 LV EF: 50% - 55%  ------------------------------------------------------------------- Indications: Dyspnea 786.09.  ------------------------------------------------------------------- History: PMH: Atrial fibrillation. Congestive heart failure. Risk factors: Hypertension. Diabetes mellitus. Morbidly obese.  ------------------------------------------------------------------- Study Conclusions  - Left ventricle: The cavity size was normal. There was mild  concentric hypertrophy. Systolic function was normal. The  estimated ejection fraction was in the range of 50% to 55%. Wall  motion was normal; there were no regional wall motion  abnormalities. Doppler parameters are consistent with abnormal  left ventricular relaxation (grade 1 diastolic dysfunction). - Aortic valve: Valve mobility was restricted. There was moderate  stenosis. Peak velocity (S): 316 cm/s. Mean gradient (S): 25 mm  Hg. - Mitral valve: Calcified annulus. Mildly thickened leaflets .  Mobility was restricted. The findings are consistent with mild  stenosis. There was mild regurgitation. Mean gradient  (D): 6 mm  Hg. Valve area by pressure half-time: 2.42 cm^2. Valve area by  continuity equation (using LVOT flow): 1.17 cm^2. - Left atrium: The atrium was severely dilated.  Impressions:  - Compared to the prior study, there has been no significant  interval change. _____________   History of Present Illness     This is a 75 y.o. male with no prior history of CAD but has history of moderate AS (mean gradient 28, AVA 0.9 cm2), OSA (not on CPAP), long standing permanent atrial fibrillation on coumadin s/p DCCV, morbid obesity, hyperlipidemia, HTN, malignant lymphoma (in remission) who presented with SOB.   Patient has been having increased orthopnea, leg swelling and PND since the last one month. Today he was at church when he bent down and started feeling more SOB. Patient has bendopnea, SOB, PND, orthopnea and leg swelling. He has been getting SOB while sleeping on the right side. He felt lightheaded. He denied syncope, CHF episodes or anginal episodes in the past. He did not have any recent stress test. Last stress test in 2009 did not show reversible ischemia. He had good response to IV lasix 40 mg in the ED.    Hospital Course      Mr. Sorto was admitted for acute on chronic diastolic heart failure in the setting of severe AST and atrial fibrillation with RVR. Chest x-ray shows pulmonary edema, he also had elevated BNP. His benazepril was held even borderline BP and need for aggressive IV diuresis. TSH was normal. Echocardiogram was repeated on 06/14/2015 which showed EF 50-55%, no regional wall motion abnormality, moderate AST with peak velocity of 316 cm/s, mean gradient 25 mmHg, mild MS/MR, compared to the previous study there has been no significant interval changes. He was transitioned to 40 mg PO lasix on 06/14/2015.  Patient was seen on the following day, at which  time he has diuresed 7.5 L total. His weight has came down to 281 pounds down from 302 lbs which will serve as his new  dry weight. He is deemed stable for discharge from cardiac perspective. I have discussed with the patient the need to avoid salt and ligament daily fluid intake. I have also advised him to contact cardiology if his weight increased by more than 3 pounds overnight or 5 pounds in a single week. I have arranged seven-day transition of care follow-up. He will obtain basic metabolic panel and PT/INR at his PCPs office this Friday 3/17 and fax the result to Dr. Hassell Done office.  _____________  Discharge Vitals Blood pressure 122/51, pulse 79, temperature 97.2 F (36.2 C), temperature source Oral, resp. rate 18, height 5\' 11"  (1.803 m), weight 281 lb 11.2 oz (127.778 kg), SpO2 95 %.  Filed Weights   06/13/15 0300 06/14/15 0500 06/15/15 0515  Weight: 296 lb 9.6 oz (134.537 kg) 285 lb 11.5 oz (129.6 kg) 281 lb 11.2 oz (127.778 kg)    Labs & Radiologic Studies     CBC  Recent Labs  06/13/15 0458  WBC 8.8  HGB 11.2*  HCT 36.7*  MCV 88.2  PLT 0000000   Basic Metabolic Panel  Recent Labs  06/15/15 0332 06/15/15 1457  NA 142 137  K 3.6 4.1  CL 100* 98*  CO2 30 27  GLUCOSE 107* 238*  BUN 13 17  CREATININE 0.99 1.07  CALCIUM 9.0 9.0   Liver Function Tests  Recent Labs  06/13/15 0458  AST 24  ALT 16*  ALKPHOS 87  BILITOT 0.9  PROT 6.5  ALBUMIN 3.7   D-Dimer  Recent Labs  06/12/15 2230  DDIMER 0.39   Hemoglobin A1C  Recent Labs  06/14/15 0454  HGBA1C 6.9*   Thyroid Function Tests  Recent Labs  06/14/15 0542  TSH 1.949    Dg Chest 2 View  06/12/2015  CLINICAL DATA:  75 year old male of with shortness breath for 1 month. Increasing shortness breath for the past 2 days. Presyncopal event this morning. Edema in both legs. EXAM: CHEST  2 VIEW COMPARISON:  Chest x-ray 06/03/2003. FINDINGS: There is cephalization of the pulmonary vasculature and slight indistinctness of the interstitial markings suggestive of mild pulmonary edema. Small left pleural effusion. Mild  cardiomegaly. Atherosclerosis in the thoracic aorta. IMPRESSION: 1. The appearance the chest suggests mild congestive heart failure, as above. Electronically Signed   By: Vinnie Langton M.D.   On: 06/12/2015 18:29    Disposition   Pt is being discharged home today in good condition.  Follow-up Plans & Appointments    Follow-up Information    Follow up with Almyra Deforest, [place] On 06/22/2015.   Specialties:  Cardiology, Radiology   Why:  10:30AM. Cardiology followup with Dr. Hassell Done PA   Contact information:   [address] [place] [address] [phone]       Follow up with Tawanna Solo, MD. Schedule an appointment as soon as possible for a visit on 06/18/2015.   Specialty:  Family Medicine   Why:  Check coumadin level (PT/INR) and BMET this Friday, have result sent to Dr. Hassell Done office   Contact information:   [address] [place] [address] [phone]      Discharge Instructions    AMB Referral to [facility]    Complete by:  As directed   Reason for consult:  HF exacerbation  Diagnoses of:  Heart Failure Diabetes    Expected date of contact:  1-3 days (reserved for hospital discharges)  Please assign to community nurse for transition of care calls and assess for home visits.  HF exacerbation.  Patient lives alone, had no scale prior to admission, would like information on diet and symptom management.  [employer] patient.  Patient request when calling to leave a message as he has a Advertising account executive.   Questions please call:     Diet - low sodium heart healthy    Complete by:  As directed      Increase activity slowly    Complete by:  As directed            Discharge Medications   Current Discharge Medication List    START taking these medications   Details  furosemide (LASIX) 40 MG tablet Take 1 tablet (40 mg total) by mouth daily. Qty: 90 tablet, Refills: 1    potassium chloride 20 MEQ TBCR Take 20 mEq by mouth  daily. Qty: 90 tablet, Refills: 1      CONTINUE these medications which have NOT CHANGED   Details  atorvastatin (LIPITOR) 20 MG tablet Take 20 mg by mouth daily at 6 PM.     diltiazem (CARDIZEM CD) 120 MG 24 hr capsule Take 120 mg by mouth daily at 6 PM.    glimepiride (AMARYL) 4 MG tablet Take 4 mg by mouth daily with breakfast.     metFORMIN (GLUCOPHAGE-XR) 500 MG 24 hr tablet Take 500 mg by mouth 2 (two) times daily.     metoprolol succinate (TOPROL-XL) 100 MG 24 hr tablet Take 100 mg by mouth daily. Take with or immediately following a meal.    Multiple Vitamins-Minerals (PRESERVISION AREDS 2 PO) Take 1 tablet by mouth daily.   Associated Diagnoses: Other malignant lymphomas, unspecified site, extranodal and solid organ sites    pioglitazone (ACTOS) 45 MG tablet Take 45 mg by mouth daily.    warfarin (COUMADIN) 6 MG tablet Take 6 mg by mouth daily at 6 PM. 3 mg on Thursday, 6 mg all other days      STOP taking these medications     benazepril (LOTENSIN) 40 MG tablet            Outstanding Labs/Studies   PT/INR and BMET on Friday 06/18/2015  Duration of Discharge Encounter   Greater than 30 minutes including physician time.  Signed, Almyra Deforest PA-C 06/15/2015, 5:06 PM

## 2015-06-15 NOTE — Telephone Encounter (Signed)
TCM phone call .Marland Kitchen Appt is on 06/22/15 at 10:30a w/ Almyra Deforest at the [facility] office   Thanks

## 2015-06-15 NOTE — Progress Notes (Signed)
CARDIAC REHAB PHASE I   PRE:  Rate/Rhythm:   BP:  Supine:   Sitting: 114/48  Standing:    SaO2: 98%RA  MODE:  Ambulation: 420 ft   POST:  Rate/Rhythm: 93  BP:  Supine:   Sitting: 135/60  Standing:    SaO2: 97%RA 1425-1442 Pt walked 420 ft with asst x 1. Tolerated well. No c/o DOE.  Graylon Good, RN BSN  06/15/2015 2:39 PM  85 afib

## 2015-06-15 NOTE — Progress Notes (Addendum)
Subjective:  No CP/SOB, feeling clinically improved  Objective:  Temp:  [97.2 F (36.2 C)-97.9 F (36.6 C)] 97.2 F (36.2 C) (03/14 1152) Pulse Rate:  [79-89] 79 (03/14 1152) Resp:  [18] 18 (03/14 1152) BP: (108-137)/(51-73) 122/51 mmHg (03/14 1152) SpO2:  [95 %-98 %] 95 % (03/14 1152) Weight:  [281 lb 11.2 oz (127.778 kg)] 281 lb 11.2 oz (127.778 kg) (03/14 0515) Weight change: -4 lb 0.3 oz (-1.822 kg)  Intake/Output from previous day: 03/13 0701 - 03/14 0700 In: 1080 [P.O.:1080] Out: 2150 [Urine:2150]  Intake/Output from this shift: Total I/O In: 480 [P.O.:480] Out: 1150 [Urine:1150]  Physical Exam: General appearance: alert and no distress Neck: no adenopathy, no carotid bruit, no JVD, supple, symmetrical, trachea midline and thyroid not enlarged, symmetric, no tenderness/mass/nodules Lungs: clear to auscultation bilaterally Heart: irregularly irregular rhythm Extremities: extremities normal, atraumatic, no cyanosis or edema  Lab Results: Results for orders placed or performed during the hospital encounter of 06/12/15 (from the past 48 hour(s))  Glucose, capillary     Status: Abnormal   Collection Time: 06/13/15  4:41 PM  Result Value Ref Range   Glucose-Capillary 137 (H) 65 - 99 mg/dL  Glucose, capillary     Status: Abnormal   Collection Time: 06/13/15  9:31 PM  Result Value Ref Range   Glucose-Capillary 137 (H) 65 - 99 mg/dL  Hemoglobin A1c     Status: Abnormal   Collection Time: 06/14/15  4:54 AM  Result Value Ref Range   Hgb A1c MFr Bld 6.9 (H) 4.8 - 5.6 %    Comment: (NOTE)         Pre-diabetes: 5.7 - 6.4         Diabetes: >6.4         Glycemic control for adults with diabetes: <7.0    Mean Plasma Glucose 151 mg/dL    Comment: (NOTE) Performed At: [facility] [place], [place] [id number] Lindon Romp MD CH:[id number]   Protime-INR     Status: Abnormal   Collection Time: 06/14/15  4:55 AM  Result Value Ref Range   Prothrombin Time 20.3 (H) 11.6 - 15.2 seconds   INR 1.73 (H) 0.00 - 8.78  Basic metabolic panel     Status: Abnormal   Collection Time: 06/14/15  4:55 AM  Result Value Ref Range   Sodium 140 135 - 145 mmol/L   Potassium 3.4 (L) 3.5 - 5.1 mmol/L   Chloride 101 101 - 111 mmol/L   CO2 28 22 - 32 mmol/L   Glucose, Bld 120 (H) 65 - 99 mg/dL   BUN 11 6 - 20 mg/dL   Creatinine, Ser 0.86 0.61 - 1.24 mg/dL   Calcium 8.9 8.9 - 10.3 mg/dL   GFR calc non Af Amer >60 >60 mL/min   GFR calc Af Amer >60 >60 mL/min    Comment: (NOTE) The eGFR has been calculated using the CKD EPI equation. This calculation has not been validated in all clinical situations. eGFR's persistently <60 mL/min signify possible Chronic Kidney Disease.    Anion gap 11 5 - 15  TSH     Status: None   Collection Time: 06/14/15  5:42 AM  Result Value Ref Range   TSH 1.949 0.350 - 4.500 uIU/mL  Glucose, capillary     Status: Abnormal   Collection Time: 06/14/15  7:52 AM  Result Value Ref Range   Glucose-Capillary 124 (H) 65 - 99 mg/dL  Glucose, capillary  Status: Abnormal   Collection Time: 06/14/15 11:35 AM  Result Value Ref Range   Glucose-Capillary 177 (H) 65 - 99 mg/dL   Comment 1 Notify RN   Glucose, capillary     Status: Abnormal   Collection Time: 06/14/15  5:12 PM  Result Value Ref Range   Glucose-Capillary 136 (H) 65 - 99 mg/dL   Comment 1 Notify RN   Glucose, capillary     Status: Abnormal   Collection Time: 06/14/15  8:47 PM  Result Value Ref Range   Glucose-Capillary 113 (H) 65 - 99 mg/dL   Comment 1 Notify RN    Comment 2 Document in Chart   Protime-INR     Status: Abnormal   Collection Time: 06/15/15  3:32 AM  Result Value Ref Range   Prothrombin Time 20.0 (H) 11.6 - 15.2 seconds   INR 1.70 (H) 0.00 - 5.10  Basic metabolic panel     Status: Abnormal   Collection Time: 06/15/15  3:32 AM  Result Value Ref Range   Sodium 142 135 - 145 mmol/L   Potassium 3.6 3.5 - 5.1 mmol/L   Chloride 100 (L)  101 - 111 mmol/L   CO2 30 22 - 32 mmol/L   Glucose, Bld 107 (H) 65 - 99 mg/dL   BUN 13 6 - 20 mg/dL   Creatinine, Ser 0.99 0.61 - 1.24 mg/dL   Calcium 9.0 8.9 - 10.3 mg/dL   GFR calc non Af Amer >60 >60 mL/min   GFR calc Af Amer >60 >60 mL/min    Comment: (NOTE) The eGFR has been calculated using the CKD EPI equation. This calculation has not been validated in all clinical situations. eGFR's persistently <60 mL/min signify possible Chronic Kidney Disease.    Anion gap 12 5 - 15  Glucose, capillary     Status: Abnormal   Collection Time: 06/15/15  6:00 AM  Result Value Ref Range   Glucose-Capillary 106 (H) 65 - 99 mg/dL  Glucose, capillary     Status: Abnormal   Collection Time: 06/15/15 10:47 AM  Result Value Ref Range   Glucose-Capillary 152 (H) 65 - 99 mg/dL    Imaging: Imaging results have been reviewed Tele- AF with CVR ( I personally reviewed)  Assessment/Plan:   1. Active Problems: 2.   Mitral valve disorder 3.   Mixed hyperlipidemia 4.   Obesity 5.   Essential hypertension, benign 6.   Aortic stenosis 7.   Acute diastolic heart failure ([place]) 8.   Atrial fibrillation with RVR ([place]) 9.   Dyspnea 10.   Type 2 diabetes mellitus with hyperlipidemia ([place]) 11.   Time Spent Directly with Patient:  20 minutes  Length of Stay:  LOS: 2 days   Pt has diuresed 7.5L. Feeling clinically improved. AF with CVR. Started on PO lasix. INR 1.7 sub therapeutic, extra coumadin given by Pharm. OK for DC home. TOC 7 ( check INR as OP)  then ROV with JV. 2D shows Nl LV fxn with mod AS (no change from prior echo).   Quay Burow 06/15/2015, 2:51 PM

## 2015-06-16 ENCOUNTER — Ambulatory Visit: Payer: Self-pay | Admitting: *Deleted

## 2015-06-16 NOTE — Telephone Encounter (Signed)
Patient contacted regarding discharge from-[facility] on March 14,2017  Patient understands to follow up with provider yes.  Hao Meng,PA on 06/22/15 at 10:30 at [place]. Patient understands discharge instructions? yes Patient understands medications and regiment? yes Patient understands to bring all medications to this visit? yes   Pt reports he has appt with primary care on 06/18/15

## 2015-06-18 ENCOUNTER — Other Ambulatory Visit: Payer: Self-pay | Admitting: *Deleted

## 2015-06-18 ENCOUNTER — Encounter: Payer: Self-pay | Admitting: Interventional Cardiology

## 2015-06-18 DIAGNOSIS — Z7901 Long term (current) use of anticoagulants: Secondary | ICD-10-CM | POA: Diagnosis not present

## 2015-06-18 DIAGNOSIS — Z79899 Other long term (current) drug therapy: Secondary | ICD-10-CM | POA: Diagnosis not present

## 2015-06-18 LAB — PROTIME-INR: INR: 2.3 — AB (ref 0.9–1.1)

## 2015-06-18 NOTE — Patient Outreach (Signed)
Attempted initial transition of care call, left a message to return my call.  Deloria Lair [facility] [place] [phone]

## 2015-06-21 ENCOUNTER — Other Ambulatory Visit: Payer: Self-pay | Admitting: *Deleted

## 2015-06-21 NOTE — Patient Outreach (Signed)
Called pt did not answer, I left a message and requested a return call.  Gregory [name] [facility] [phone]

## 2015-06-21 NOTE — Patient Outreach (Signed)
Transition of care call #1 completed. Pt returned my call. He is doing very well. He lost 30# during his hospitlaization. This was the first occurrence of CHF. He has all meds and is taking them. He has seen his primary MD and will see his cardiologist tomorrow. Home health was not ordered but [facility] was introduced to him by Natividad Brood and he accepted the invitation to participate. He is learning how to manage a low salt diet and needs some help with this. He is weighing every day. He denies SOB, significant wt gain or edema. He is sleeping well.  He has agreed to a home visit with me on Wednesday 06/23/15.  Deloria Lair [facility] [place] [phone]

## 2015-06-22 ENCOUNTER — Ambulatory Visit (INDEPENDENT_AMBULATORY_CARE_PROVIDER_SITE_OTHER): Payer: PPO | Admitting: Physician Assistant

## 2015-06-22 ENCOUNTER — Ambulatory Visit: Payer: Self-pay | Admitting: *Deleted

## 2015-06-22 ENCOUNTER — Encounter: Payer: Self-pay | Admitting: Physician Assistant

## 2015-06-22 ENCOUNTER — Encounter: Payer: Self-pay | Admitting: Interventional Cardiology

## 2015-06-22 VITALS — BP 118/70 | HR 72 | Ht 71.0 in | Wt 281.6 lb

## 2015-06-22 DIAGNOSIS — I350 Nonrheumatic aortic (valve) stenosis: Secondary | ICD-10-CM | POA: Diagnosis not present

## 2015-06-22 DIAGNOSIS — I5032 Chronic diastolic (congestive) heart failure: Secondary | ICD-10-CM | POA: Diagnosis not present

## 2015-06-22 NOTE — Patient Instructions (Signed)
Medication Instructions:  NO CHANGES   Labwork: NONE  Testing/Procedures: NONE  Follow-Up: Your physician recommends that you schedule a follow-up appointment in: 3 MONTHS  WITH  DR   Irish Lack   Any Other Special Instructions Will Be Listed Below (If Applicable).     If you need a refill on your cardiac medications before your next appointment, please call your pharmacy.

## 2015-06-22 NOTE — Progress Notes (Signed)
Cardiology Office Note   Date:  06/22/2015   ID:  Gregory [name], DOB [birth year], MRN [id number]  PCP:  Tawanna Solo, MD  Cardiologist:  Dr. Irish Lack  Chief Complaint  Patient presents with  . Follow-up    7 day TCM followup after recent acute on chronic diastolic HF admission      History of Present Illness: Gregory [name] is a 75 y.o. male who presents for a seven-day TCM post hospital follow-up. He has a history of moderate AS, permanent atrial fibrillation on Coumadin, morbid obesity, hyperlipidemia, hypertension and malignant lymphoma in remission. He was admitted to [facility] recently for increasing shortness of breath. He was treated for acute on chronic diastolic heart failure in the setting of possible severe AS and atrial fibrillation with RVR. He had a good response to IV Lasix. Echocardiogram was repeated on 06/14/2015 which showed EF 50-55%, no regional wall motion abnormality, moderate AS with peak velocity 316 cm/s, mean gradient 25 mmHg, mild AF/AR, compared to the previous study there has been no significant interval changes. He was transitioned to oral Lasix 40 mg on 3/13. His weight on discharge is came down from 302 pounds down to 281 pounds   On discharge, he was instructed to obtain basic metabolic panel and PT/INR at his PCPs office and have the results faxed to [place]. He presents today for post hospital follow-up. Since his discharge, his weight has been largely stable at home. He does weigh himself every morning. He understands to avoid high sodium diet and limit daily fluid intake. He states he has not slept as good as he is now for a long time, and appears to be very happy. He did have a PT/INR and basic metabolic panel and his PCPs office last Friday. We have not received the result. Otherwise he appears to be euvolemic on today's physical exam. He denies any shortness of breath. He appears very well. Only complaint he had today is right knee weakness. He  felt he may have torn something. He is going to talk to his orthopedic doctor and worried he might need surgery. He never have any h/o MI, nor has he experienced any chest pain before.     Past Medical History  Diagnosis Date  . Diabetes mellitus   . Hypertension   . Ulcer   . 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 History of kidney stones   . Ruptured disk   . Other malignant lymphomas, unspecified site, extranodal and solid organ sites     NHL  . Cancer ([place])     NHL    Past Surgical History  Procedure Laterality Date  . Appendectomy  1967     Current Outpatient Prescriptions  Medication Sig Dispense Refill  . atorvastatin (LIPITOR) 20 MG tablet Take 20 mg by mouth daily at 6 PM.     . diltiazem (CARDIZEM CD) 120 MG 24 hr capsule Take 120 mg by mouth daily at 6 PM.    . furosemide (LASIX) 40 MG tablet Take 1 tablet (40 mg total) by mouth daily. 90 tablet 1  . glimepiride (AMARYL) 4 MG tablet Take 4 mg by mouth daily with breakfast.     . metFORMIN (GLUCOPHAGE-XR) 500 MG 24 hr tablet Take 500 mg by mouth 2 (two) times daily.     . metoprolol succinate (TOPROL-XL) 100 MG 24 hr tablet Take 100 mg by mouth daily. Take with  or immediately following a meal.    . Multiple Vitamins-Minerals (PRESERVISION AREDS 2 PO) Take 1 tablet by mouth daily.    . pioglitazone (ACTOS) 45 MG tablet Take 45 mg by mouth daily.    . potassium chloride 20 MEQ TBCR Take 20 mEq by mouth daily. 90 tablet 1  . warfarin (COUMADIN) 6 MG tablet Take 6 mg by mouth daily at 6 PM. 3 mg on Thursday, 6 mg all other days     No current facility-administered medications for this visit.    Allergies:   Bisoprolol-hydrochlorothiazide and Nsaids    Social History:  The patient  reports that he has never smoked. He does not have any smokeless tobacco history on file. He reports that he does not drink alcohol or use illicit drugs.   Family History:  The patient's family history includes  Cancer in his mother; Heart attack in his father and mother; Hypertension in his father. There is no history of Stroke.    ROS:  Please see the history of present illness.   Otherwise, review of systems are positive for none.   All other systems are reviewed and negative.    PHYSICAL EXAM: VS:  BP 118/70 mmHg  Pulse 72  Ht 5\' 11"  (1.803 m)  Wt 281 lb 9.6 oz (127.733 kg)  BMI 39.29 kg/m2 , BMI Body mass index is 39.29 kg/(m^2). GEN: Well nourished, well developed, in no acute distress HEENT: normal Neck: no JVD, carotid bruits, or masses Cardiac: irregular; no rubs, or gallops,no edema. 2/6 systolic murmur Respiratory:  clear to auscultation bilaterally, normal work of breathing GI: soft, nontender, nondistended, + BS MS: no deformity or atrophy Skin: warm and dry, no rash Neuro:  Strength and sensation are intact Psych: euthymic mood, full affect   EKG:  EKG is not ordered today.   Recent Labs: 06/13/2015: ALT 16*; Hemoglobin 11.2*; Platelets 233 06/14/2015: TSH 1.949 06/15/2015: B Natriuretic Peptide 106.6*; BUN 17; Creatinine, Ser 1.07; Potassium 4.1; Sodium 137    Lipid Panel No results found for: CHOL, TRIG, HDL, CHOLHDL, VLDL, LDLCALC, LDLDIRECT    Wt Readings from Last 3 Encounters:  06/22/15 281 lb 9.6 oz (127.733 kg)  06/15/15 281 lb 11.2 oz (127.778 kg)  03/04/15 295 lb (133.811 kg)      Other studies Reviewed: Additional studies/ records that were reviewed today include:   Echo 06/14/2015 LV EF: 50% - 55%  ------------------------------------------------------------------- Indications: Dyspnea 786.09.  ------------------------------------------------------------------- History: PMH: Atrial fibrillation. Congestive heart failure. Risk factors: Hypertension. Diabetes mellitus. Morbidly obese.  ------------------------------------------------------------------- Study Conclusions  - Left ventricle: The cavity size was normal. There was  mild  concentric hypertrophy. Systolic function was normal. The  estimated ejection fraction was in the range of 50% to 55%. Wall  motion was normal; there were no regional wall motion  abnormalities. Doppler parameters are consistent with abnormal  left ventricular relaxation (grade 1 diastolic dysfunction). - Aortic valve: Valve mobility was restricted. There was moderate  stenosis. Peak velocity (S): 316 cm/s. Mean gradient (S): 25 mm  Hg. - Mitral valve: Calcified annulus. Mildly thickened leaflets .  Mobility was restricted. The findings are consistent with mild  stenosis. There was mild regurgitation. Mean gradient (D): 6 mm  Hg. Valve area by pressure half-time: 2.42 cm^2. Valve area by  continuity equation (using LVOT flow): 1.17 cm^2. - Left atrium: The atrium was severely dilated.  Impressions:  - Compared to the prior study, there has been no significant  interval change.  Review of the above records demonstrates:   Recently admitted for acute HR in the setting of afib with RVR and moderate AS.    ASSESSMENT AND PLAN:  1.  Chronic diastolic heart failure   - euvolemic today, he obtained BMET and PT/INR at Dr. Sabra Heck office after discharge, we will request those lab result to make sure his renal function is stable after lasix and his INR level is therapeutic  - continue current medication, he appears to be very stable. He is worried he may need knee surgery later, he does not have any h/o MI or does he have any chest pain, would not pursue any ischemic work if need surgery. He may need to hold coumadin prior to such surgery though, hopefully with lovenox bridge given permanent afib  2. Moderate AS: unchanged on recent echo  3. permanent atrial fibrillation on Coumadin: await lab result from Dr. Ammie Ferrier office  - rate controlled  4. Hyperlipidemia: on lipitor  5. Hypertension: well contorlled on diltiazem, toprol XL.    Current medicines are  reviewed at length with the patient today.  The patient does not have concerns regarding medicines.  The following changes have been made:  no change  Labs/ tests ordered today include:  No orders of the defined types were placed in this encounter.     Disposition:   FU with Dr. Irish Lack in 3 months  Signed, Almyra Deforest, [place]  06/22/2015 10:21 PM    [address], [place], [place]  [address] Phone: [phone]; Fax: [phone]

## 2015-06-23 ENCOUNTER — Encounter: Payer: Self-pay | Admitting: *Deleted

## 2015-06-23 ENCOUNTER — Telehealth: Payer: Self-pay | Admitting: Physician Assistant

## 2015-06-23 ENCOUNTER — Other Ambulatory Visit: Payer: Self-pay | Admitting: *Deleted

## 2015-06-23 NOTE — Patient Outreach (Addendum)
[facility]) Care Management   06/23/2015  Gregory [name] 08/08/1940 [id number]  Gregory [name] is an 75 y.o. male  Subjective: [facility] Care Management pt, Initial home visit, after recent admittion for CHF.  Objective:   Review of Systems  Constitutional: Positive for weight loss.  HENT: Negative.   Eyes:       Pt reports left watery eye, no irritation, just tearing and running down his face.  Respiratory: Negative.   Cardiovascular: Negative.   Gastrointestinal: Negative.   Genitourinary: Negative.   Musculoskeletal: Negative.   Skin: Negative.   Neurological: Negative.   Endo/Heme/Allergies: Bruises/bleeds easily.  Psychiatric/Behavioral: Negative.     Physical Exam  Constitutional: He is oriented to person, place, and time. He appears well-developed and well-nourished.  HENT:  Head: Normocephalic.  Eyes: Left eye exhibits discharge.  Neck: Normal range of motion. Neck supple.  Cardiovascular: Normal rate, regular rhythm and normal heart sounds.   Respiratory: Effort normal and breath sounds normal.  GI: Soft. Bowel sounds are normal.  Musculoskeletal: Normal range of motion.  Neurological: He is alert and oriented to person, place, and time.  Skin: Skin is warm and dry.  Psychiatric: He has a normal mood and affect. His behavior is normal. Judgment and thought content normal.    Current Medications:   Current Outpatient Prescriptions  Medication Sig Dispense Refill  . atorvastatin (LIPITOR) 20 MG tablet Take 20 mg by mouth daily at 6 PM.     . diltiazem (CARDIZEM CD) 120 MG 24 hr capsule Take 120 mg by mouth daily at 6 PM.    . furosemide (LASIX) 40 MG tablet Take 1 tablet (40 mg total) by mouth daily. 90 tablet 1  . glimepiride (AMARYL) 4 MG tablet Take 4 mg by mouth daily with breakfast.     . metFORMIN (GLUCOPHAGE-XR) 500 MG 24 hr tablet Take 500 mg by mouth 2 (two) times daily.     . metoprolol succinate (TOPROL-XL) 100 MG 24 hr tablet Take  100 mg by mouth daily. Take with or immediately following a meal.    . Multiple Vitamins-Minerals (PRESERVISION AREDS 2 PO) Take 1 tablet by mouth daily.    . potassium chloride 20 MEQ TBCR Take 20 mEq by mouth daily. 90 tablet 1  . warfarin (COUMADIN) 6 MG tablet Take 6 mg by mouth daily at 6 PM. 3 mg on Thursday, 6 mg all other days    . pioglitazone (ACTOS) 45 MG tablet Take 45 mg by mouth daily.     No current facility-administered medications for this visit.    Functional Status:   In your present state of health, do you have any difficulty performing the following activities: 06/23/2015  Hearing? N  Vision? N  Difficulty concentrating or making decisions? N  Walking or climbing stairs? N  Dressing or bathing? N  Doing errands, shopping? N  Preparing Food and eating ? N  Using the Toilet? N  In the past six months, have you accidently leaked urine? N  Do you have problems with loss of bowel control? N  Managing your Medications? N  Managing your Finances? N  Housekeeping or managing your Housekeeping? N    Fall/Depression Screening:    PHQ 2/9 Scores 06/23/2015  PHQ - 2 Score 0   Fall Risk  06/23/2015  Falls in the past year? No    Assessment:  New dx CHF  DM  Plan:  See care plan. I will see pt again in 2 weeks.  THN CM Care Plan Problem One        Most Recent Value   Care Plan Problem One  CHF   Role Documenting the Problem One  Care Management Coordinator   Care Plan for Problem One  Active   THN Long Term Goal (31-90 days)  Pt will not readmit in the next 90 days.   THN Long Term Goal Start Date  06/23/15   Interventions for Problem One Long Term Goal  Reiviewed CHF materials, Action Plan, Daily self management.   THN CM Short Term Goal #1 (0-30 days)  Pt will not readmit in 30 days.   THN CM Short Term Goal #1 Start Date  06/23/15   Interventions for Short Term Goal #1  Same as for long term goal.   THN CM Short Term Goal #2 (0-30 days)   Pt to start estimating his sodium intake daily for the next 2 weeks.   THN CM Short Term Goal #2 Start Date  06/23/15   Interventions for Short Term Goal #2  Showed pt how to read labels, serving sizes and advised to try to stay under 2000 mg daily.   THN CM Short Term Goal #3 (0-30 days)  Pt will call NP if problems arise or if he goes in the [place] CM Short Term Goal #3 Start Date  06/23/15   Interventions for Short Tern Goal #3  Reinforced calling early for problems to avoid complications.     Deloria Lair GNP-BC [place] [phone]  Addendum:  Dr. Sabra Heck I gave the pt an Rx for diabetic shoes on your behalf.  Deloria Lair [facility] [place] [phone]

## 2015-06-23 NOTE — Telephone Encounter (Signed)
Labs received from [facility] Altha Harm back Friday 3/24 will give to her then.

## 2015-07-01 ENCOUNTER — Other Ambulatory Visit: Payer: Self-pay | Admitting: *Deleted

## 2015-07-05 ENCOUNTER — Other Ambulatory Visit: Payer: Self-pay | Admitting: *Deleted

## 2015-07-05 NOTE — Patient Outreach (Signed)
Transition of care call #2 - Pt answered phone today. He states he is staying busy. He deneis any SOB, Edema. His weight is stable. He is watching his salt intake. I asked him what time would be convenient for him to visit with him tomorrow as I have him on my schedule for a home visit but not the time. We decided on 11:30. I will see him then.  Deloria Lair [facility] [phone]

## 2015-07-06 ENCOUNTER — Other Ambulatory Visit: Payer: Self-pay | Admitting: *Deleted

## 2015-07-06 NOTE — Patient Outreach (Signed)
[facility]) Care Management  07/06/2015  KALEEM LORES 01-22-1941 [id number]    Routine home visit.  S: Pt is doing very well. He has maintained a stable weight between 272 - 275. He denies any SOB or edema. He has been paying attention to the sodium content in the foods he is eating. He is writing down his weights and glucose levels.  O:  BP 128/68 mmHg  Pulse 68  Resp 18  Wt 272 lb (123.378 kg)  SpO2 96%       RRR       Lungs clear       Bilateral distal leg rubra (pt reports is long standing) He has a healing wound L posterior calf.       Feet - pt is bare footed. I note he has a splinter in the distal aspect of his L great toe which he is           unaware of. There is no redness or drainage.  A:  CHF  P:  Removed splinter with a sterile needle, cleansed area with an alcohol swab and applied a bandaid.       Discussed foot care: inspect feel daily, wear shoes! See a podiatrist every 3-6 months.       I will call pt in one week.  Deloria Lair [facility] [place] [phone]

## 2015-07-16 ENCOUNTER — Other Ambulatory Visit: Payer: Self-pay | Admitting: *Deleted
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
## 2015-07-23 ENCOUNTER — Other Ambulatory Visit: Payer: Self-pay | Admitting: *Deleted

## 2015-07-23 NOTE — Patient Outreach (Signed)
Transition of care call #4. Pt was not at home. I left a message and asked him to return my call. I will try to reach him again on Monday.  Deloria Lair [facility] [phone]

## 2015-07-28 ENCOUNTER — Other Ambulatory Visit: Payer: Self-pay | Admitting: *Deleted

## 2015-07-28 NOTE — Patient Outreach (Addendum)
Transition of care call #4 - Pt was not home. I left a message and asked that he return my call.  Deloria Lair [facility] [phone]  07/29/15 Pt returned my call. He is doing very well. He is taking his self management of his CHF and diabetes very seriously. He is proud to announce he has lot weight! His current weight is 266 down from 275 (-9#). He is looking forward to meeting a goal of 250# by July 4th! He says he is able to get up and go and do chores, lawn work, Social research officer, government. He denies any SOB or edema. He did get his [place] Letters and is using that to cook with.  I will see pt again on May 2nd and we will do some more work on [place].  Deloria Lair [facility] [phone]

## 2015-07-30 ENCOUNTER — Other Ambulatory Visit: Payer: Self-pay | Admitting: *Deleted

## 2015-08-03 ENCOUNTER — Other Ambulatory Visit: Payer: Self-pay | Admitting: *Deleted

## 2015-08-03 ENCOUNTER — Encounter: Payer: Self-pay | Admitting: *Deleted

## 2015-08-03 NOTE — Patient Outreach (Signed)
[facility]) Care Management  08/03/2015  SHIVANK PINEDO 1940-11-08 [id number]   S:  Pt is doing very well. He is consistently loosing wt, slowly. He denies any CHF sxs. He has become very aware of food labels and looking at sodium content and starting to look at carbohydrate content.  O: BP 110/56 mmHg  Pulse 74  Resp 16  Wt 264 lb (119.75 kg)  SpO2 98% FBS 109  A:  CHF - stable      Diabetes improving control  P:  Reviewed Carb counting guidelines.       Pt to write down what he eats and attempt to figure out his carb count.       I will return in one month.  THN CM Care Plan Problem One        Most Recent Value   Care Plan Problem One  CHF   Role Documenting the Problem One  Care Management Coordinator   Care Plan for Problem One  Active   THN Long Term Goal (31-90 days)  Pt will not readmit in the next 90 days.   THN Long Term Goal Start Date  06/23/15   Interventions for Problem One Long Term Goal  Reiviewed CHF materials, Action Plan, Daily self management.   THN CM Short Term Goal #1 (0-30 days)  Pt will not readmit in 30 days.   THN CM Short Term Goal #1 Start Date  06/23/15   [facility] CM Short Term Goal #1 Met Date  08/03/15   Interventions for Short Term Goal #1  Congratulated pt on his accomplishments.   THN CM Short Term Goal #2 (0-30 days)  Pt to start estimating his sodium intake daily for the next 2 weeks.   THN CM Short Term Goal #2 Start Date  06/23/15   [facility] CM Short Term Goal #2 Met Date  08/03/15   Interventions for Short Term Goal #2  Praised for pt's efforts, encouraged to continue.   THN CM Short Term Goal #3 (0-30 days)  Pt will call NP if problems arise or if he goes in the [place] CM Short Term Goal #3 Start Date  06/23/15   [facility] CM Short Term Goal #3 Met Date  08/03/15   Interventions for Short Tern Goal #3  No probelms that warranted a call. Reinforced availability for problems.    THN CM Care Plan Problem Two        Most  Recent Value   Care Plan Problem Two  Diabetes - not at goal.   Role Documenting the Problem Two  Care Management Coordinator   Care Plan for Problem Two  Active   THN CM Short Term Goal #1 (0-30 days)  Pt to write down his food and try to figure out carb content.   THN CM Short Term Goal #1 Start Date  08/03/15   Interventions for Short Term Goal #2   Went through carb counting pt education.   THN CM Short Term Goal #2 (0-30 days)  Pt will keep carbs to 30-45 gms per meal over next 30 days.   THN CM Short Term Goal #2 Start Date  08/03/15   Interventions for Short Term Goal #2  Reviewed carb count for wt loss.     Deloria Lair [facility] [phone]

## 2015-08-05 NOTE — Progress Notes (Signed)
Routine home visit.  S: Pt is proud of his wt loss down 10# from my initial home visit. He is taking his low sodium, diabetic diet very seriously and is eager to learn more about eating right. He has not had any CHF sxs.  O: BP 110/80 mmHg  Pulse 68  Resp 18  Wt 264 lb (119.75 kg)  SpO2 96%    A:  CHF      Diabetes  P:  Concentrating on carb counting now. Went through refrigerator pointing out carb gms and serving sizes. Pt to keep        a food log of as many meals as possible and STUDY the "ALL ABOUT CARBS"  Handout.        I will see him again in a month.         See updated careplan.  Deloria Lair [facility] [place] [phone]

## 2015-08-25 DIAGNOSIS — I4891 Unspecified atrial fibrillation: Secondary | ICD-10-CM | POA: Diagnosis not present

## 2015-08-25 DIAGNOSIS — Z7901 Long term (current) use of anticoagulants: Secondary | ICD-10-CM | POA: Diagnosis not present

## 2015-09-07 ENCOUNTER — Encounter: Payer: Self-pay | Admitting: *Deleted

## 2015-09-07 ENCOUNTER — Other Ambulatory Visit: Payer: Self-pay | Admitting: *Deleted

## 2015-09-07 NOTE — Patient Outreach (Signed)
[facility]) Care Management   09/07/2015  Gregory [name] 1940-10-02 [id number]  Gregory [name] is an 75 y.o. male   Discharge home visit.   Subjective:  Pt is doing outstanding! He has lost an additional 7# and his glucose levels have been steadily coming down. This month his FBS have run 93-149 and the 149 was a real out lying value. He has been writing down what he eats and has a very good understanding of how much carb gms he can have without raising his glucose and his weight.  He injured his LE on the ladder cleaning out the gutters. He didn't even realize he was doing anything. He applied triple antibiotic on the abrasions and they are healing.  Pt denies SOB and edema. He has eliminated salt. He weighs every day and records it.  Objective:   Review of Systems  Constitutional: Negative.   HENT: Negative.   Eyes: Negative.   Respiratory: Negative.   Cardiovascular: Negative.   Gastrointestinal: Negative.   Genitourinary: Negative.   Musculoskeletal: Negative.   Skin:       Healing bilateral shin skin abrasions. LE redness which pt states he has had for about a year.  Neurological: Negative.   Endo/Heme/Allergies: Bruises/bleeds easily.  Psychiatric/Behavioral: Negative.     Physical Exam  Constitutional: He is oriented to person, place, and time. He appears well-developed and well-nourished.  HENT:  Head: Normocephalic and atraumatic.  Musculoskeletal: Normal range of motion.  Neurological: He is alert and oriented to person, place, and time.  Skin: Skin is warm and dry. There is erythema.  ABrasions on bilat LE, healing, skin is erythemic but pt reports they have been like that for the last year.  Psychiatric: He has a normal mood and affect.    Encounter Medications:   Outpatient Encounter Prescriptions as of 09/07/2015  Medication Sig  . atorvastatin (LIPITOR) 20 MG tablet Take 20 mg by mouth daily at 6 PM.   . diltiazem (CARDIZEM CD) 120 MG 24 hr  capsule Take 120 mg by mouth daily at 6 PM.  . furosemide (LASIX) 40 MG tablet Take 1 tablet (40 mg total) by mouth daily.  Marland Kitchen glimepiride (AMARYL) 4 MG tablet Take 4 mg by mouth daily with breakfast.   . metFORMIN (GLUCOPHAGE-XR) 500 MG 24 hr tablet Take 500 mg by mouth 2 (two) times daily.   . metoprolol succinate (TOPROL-XL) 100 MG 24 hr tablet Take 100 mg by mouth daily. Take with or immediately following a meal.  . Multiple Vitamins-Minerals (PRESERVISION AREDS 2 PO) Take 1 tablet by mouth daily.  . pioglitazone (ACTOS) 45 MG tablet Take 45 mg by mouth daily.  . potassium chloride 20 MEQ TBCR Take 20 mEq by mouth daily.  Marland Kitchen warfarin (COUMADIN) 6 MG tablet Take 6 mg by mouth daily at 6 PM. 3 mg on Thursday, 6 mg all other days   No facility-administered encounter medications on file as of 09/07/2015.    Functional Status:   In your present state of health, do you have any difficulty performing the following activities: 06/23/2015  Hearing? N  Vision? N  Difficulty concentrating or making decisions? N  Walking or climbing stairs? N  Dressing or bathing? N  Doing errands, shopping? N  Preparing Food and eating ? N  Using the Toilet? N  In the past six months, have you accidently leaked urine? N  Do you have problems with loss of bowel control? N  Managing your Medications?  N  Managing your Finances? N  Housekeeping or managing your Housekeeping? N    Fall/Depression Screening:    PHQ 2/9 Scores 06/23/2015  PHQ - 2 Score 0    Assessment:  CHF - stable                         DM - constantly improving control  Plan:  Today [name] is GRADUATING from our [facility] program! His goals are met. I have really EMPHASIZED being very vigilent when he has a foot or leg injury as he may need oral antibiotic to prevent diabetic complications. He is to continue doing his daily monitoring for CHF and DM. I am SO PROUD of him, HE IS A STAR!  THN CM Care Plan Problem One        Most Recent Value    Care Plan Problem One  CHF   Role Documenting the Problem One  Care Management Coordinator   Care Plan for Problem One  Active   THN Long Term Goal (31-90 days)  Pt will not readmit in the next 90 days.   THN Long Term Goal Start Date  06/23/15   THN Long Term Goal Met Date  09/07/15   THN CM Short Term Goal #1 (0-30 days)  Pt will not readmit in 30 days.   THN CM Short Term Goal #1 Start Date  06/23/15   [facility] CM Short Term Goal #1 Met Date  08/03/15   Interventions for Short Term Goal #1  Congratulated pt on his accomplishments.   THN CM Short Term Goal #2 (0-30 days)  Pt to start estimating his sodium intake daily for the next 2 weeks.   THN CM Short Term Goal #2 Start Date  06/23/15   [facility] CM Short Term Goal #2 Met Date  08/03/15   Interventions for Short Term Goal #2  Praised for pt's efforts, encouraged to continue.   THN CM Short Term Goal #3 (0-30 days)  Pt will call NP if problems arise or if he goes in the [place] CM Short Term Goal #3 Start Date  06/23/15   [facility] CM Short Term Goal #3 Met Date  08/03/15   Interventions for Short Tern Goal #3  No probelms that warranted a call. Reinforced availability for problems.    THN CM Care Plan Problem Two        Most Recent Value   Care Plan Problem Two  Diabetes - not at goal.   Role Documenting the Problem Two  Care Management Coordinator   Care Plan for Problem Two  Active   THN CM Short Term Goal #1 (0-30 days)  Pt to write down his food and try to figure out carb content.   THN CM Short Term Goal #1 Start Date  08/03/15   THN CM Short Term Goal #1 Met Date   09/07/15   THN CM Short Term Goal #2 (0-30 days)  Pt will keep carbs to 30-45 gms per meal over next 30 days.   THN CM Short Term Goal #2 Start Date  08/03/15   [facility] CM Short Term Goal #2 Met Date  09/07/15     Deloria Lair [facility] [phone]

## 2015-09-08 DIAGNOSIS — Z7901 Long term (current) use of anticoagulants: Secondary | ICD-10-CM | POA: Diagnosis not present

## 2015-09-08 DIAGNOSIS — I4891 Unspecified atrial fibrillation: Secondary | ICD-10-CM | POA: Diagnosis not present

## 2015-09-22 DIAGNOSIS — I4891 Unspecified atrial fibrillation: Secondary | ICD-10-CM | POA: Diagnosis not present

## 2015-09-22 DIAGNOSIS — Z7901 Long term (current) use of anticoagulants: Secondary | ICD-10-CM | POA: Diagnosis not present

## 2015-09-30 ENCOUNTER — Ambulatory Visit (INDEPENDENT_AMBULATORY_CARE_PROVIDER_SITE_OTHER): Payer: PPO | Admitting: Interventional Cardiology

## 2015-09-30 ENCOUNTER — Encounter: Payer: Self-pay | Admitting: Interventional Cardiology

## 2015-09-30 VITALS — BP 120/60 | HR 64 | Ht 70.5 in | Wt 261.6 lb

## 2015-09-30 DIAGNOSIS — I481 Persistent atrial fibrillation: Secondary | ICD-10-CM | POA: Diagnosis not present

## 2015-09-30 DIAGNOSIS — I350 Nonrheumatic aortic (valve) stenosis: Secondary | ICD-10-CM | POA: Diagnosis not present

## 2015-09-30 DIAGNOSIS — I10 Essential (primary) hypertension: Secondary | ICD-10-CM | POA: Diagnosis not present

## 2015-09-30 DIAGNOSIS — I5032 Chronic diastolic (congestive) heart failure: Secondary | ICD-10-CM | POA: Diagnosis not present

## 2015-09-30 DIAGNOSIS — I4819 Other persistent atrial fibrillation: Secondary | ICD-10-CM

## 2015-09-30 NOTE — Patient Instructions (Signed)
Medication Instructions:  Same-no changes  Labwork: None  Testing/Procedures: None  Follow-Up: Your physician wants you to follow-up in: 6 months. You will receive a reminder letter in the mail two months in advance. If you don't receive a letter, please call our office to schedule the follow-up appointment.      If you need a refill on your cardiac medications before your next appointment, please call your pharmacy.   

## 2015-09-30 NOTE — Progress Notes (Signed)
Patient ID: [name], male   DOB: January 10, 1941, 75 y.o.   MRN: [id number]     Cardiology Office Note   Date:  09/30/2015   ID:  Gregory [name], DOB 1940-09-02, MRN [id number]  PCP:  Tawanna Solo, MD    No chief complaint on file.  Diastolic heart failure  Wt Readings from Last 3 Encounters:  09/30/15 261 lb 9.6 oz (118.661 kg)  09/07/15 257 lb (116.574 kg)  08/03/15 264 lb (119.75 kg)       History of Present Illness: Gregory [name] is a 75 y.o. male  with mitral regurgtation and moderate to severe AS. Does not walk a lot. He does do a lot of yard work. Losing weight due to poor appetite.  Atrial Fibrillation F/U:  knee pain;. c/o Leg edema more at the end of the day.  Denies : Chest pain.  Dizziness.  Orthopnea.  Palpitations.    Nausea with taking metformin twice a day. Better when he takes it only at night.  Using compression stockings as well. Elevating legs.  He treats his leg itch with Ca creme from the dermatoligist. Diagnosed with eczema. He walks in stores sometimes. He is limited mostly by his leg. Does work in the yard.  No sx of severe AS as noted above. He hurt his right knee and thinks he needs an MRI. He has had prior left knee surgery. The right knee limits his walking.  He thinks he tore something in there.    In March 2017, had acute heart failure diastolic, wsa diuresed.  He is now 40 lbs lighter than he was in 2/17.  20 lbs of fluid diuresed and then 20 lb weight loss.    He has stayed away from salt.  He had been eating hot dogs and pickles frequently. He now eats more salads, broiled fish.  Right knee pain.  May need surgery on the right knee.  MRI will be next step.    Past Medical History  Diagnosis Date  . Diabetes mellitus   . Hypertension   . Ulcer   . 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 History of kidney stones   . Ruptured disk   . Other malignant lymphomas, unspecified site, extranodal and solid  organ sites     NHL  . Cancer ([place])     NHL    Past Surgical History  Procedure Laterality Date  . Appendectomy  1967     Current Outpatient Prescriptions  Medication Sig Dispense Refill  . atorvastatin (LIPITOR) 20 MG tablet Take 20 mg by mouth daily at 6 PM.     . diltiazem (CARDIZEM CD) 120 MG 24 hr capsule Take 120 mg by mouth daily at 6 PM.    . furosemide (LASIX) 40 MG tablet Take 1 tablet (40 mg total) by mouth daily. 90 tablet 1  . glimepiride (AMARYL) 4 MG tablet Take 4 mg by mouth daily with breakfast.     . metFORMIN (GLUCOPHAGE-XR) 500 MG 24 hr tablet Take 500 mg by mouth 2 (two) times daily.     . metoprolol succinate (TOPROL-XL) 100 MG 24 hr tablet Take 100 mg by mouth daily. Take with or immediately following a meal.    . Multiple Vitamins-Minerals (PRESERVISION AREDS 2 PO) Take 1 tablet by mouth daily.    . ONE TOUCH ULTRA TEST test strip 1 each by Other route as needed (BLOOD SUGAR).     Marland Kitchen  ONETOUCH DELICA LANCETS 99991111 MISC Apply 1 each topically daily as needed (BLOOD SUGAR).     Marland Kitchen pioglitazone (ACTOS) 45 MG tablet Take 45 mg by mouth daily.    . potassium chloride 20 MEQ TBCR Take 20 mEq by mouth daily. 90 tablet 1  . warfarin (COUMADIN) 6 MG tablet Take 6 mg by mouth daily at 6 PM. 3 mg on Thursday, 6 mg all other days     No current facility-administered medications for this visit.    Allergies:   Bisoprolol-hydrochlorothiazide and Nsaids    Social History:  The patient  reports that he has never smoked. He does not have any smokeless tobacco history on file. He reports that he does not drink alcohol or use illicit drugs.   Family History:  The patient's family history includes Cancer in his mother; Heart attack in his father and mother; Hypertension in his father. There is no history of Stroke.    ROS:  Please see the history of present illness.   Otherwise, review of systems are positive for joint pains, swelling.   All other systems are reviewed and  negative.    PHYSICAL EXAM: VS:  BP 120/60 mmHg  Pulse 64  Ht 5' 10.5" (1.791 m)  Wt 261 lb 9.6 oz (118.661 kg)  BMI 36.99 kg/m2 , BMI Body mass index is 36.99 kg/(m^2). GEN: Well nourished, well developed, in no acute distress HEENT: normal Neck: no JVD, carotid bruits, or masses Cardiac: irregularly irregular; 3/6 harsh systolic murmur, no rubs, or gallops, mild bilateral leg edema  Respiratory:  clear to auscultation bilaterally, normal work of breathing GI: soft, nontender, nondistended, + BS MS: no deformity or atrophy Skin: warm and dry, no rash Neuro:  Strength and sensation are intact Psych: euthymic mood, full affect     Recent Labs: 06/13/2015: ALT 16*; Hemoglobin 11.2*; Platelets 233 06/14/2015: TSH 1.949 06/15/2015: B Natriuretic Peptide 106.6*; BUN 17; Creatinine, Ser 1.07; Potassium 4.1; Sodium 137   Lipid Panel No results found for: CHOL, TRIG, HDL, CHOLHDL, VLDL, LDLCALC, LDLDIRECT   Other studies Reviewed: Additional studies/ records that were reviewed today with results demonstrating: 2017 echo: moderate  AS. Normal LV function.   ASSESSMENT AND PLAN:  1. AFib: Coumadin for stroke prevention. Rate controlled. 2. Chronic diastolic heart failure: Continue Lasix.  Appears to be euvolemic.   Check electrolytes at next blood draw with Dr. Sabra Heck. 3. Aortic stenosis: moderate by most recent echo.  No sx at this time,.   4. Hyperlipidemia : Continue atorvastatin.  Checked by Dr. Sabra Heck.  5. HTN: Controlled today.  Continue current meds.  Avoid salt intake.  Continue dietary changes.  6. Obesity: Lost some weight.  Encouraged him to try to get 150 mintues of stationary bike or walking/week.  He works on his land- 2 acres. 7. Mitral regurg: not as significant on the most recent echo.   Current medicines are reviewed at length with the patient today.  The patient concerns regarding his medicines were addressed.  The following changes have been made:  No  change  Labs/ tests ordered today include: echo in 9 months No orders of the defined types were placed in this encounter.    Recommend 150 minutes/week of aerobic exercise Low fat, low carb, high fiber diet recommended  Disposition:   FU in 6 months   Signed, Larae Grooms, MD  09/30/2015 1:38 PM    [facility] [place], [place], [place]  [address] Phone: [phone])  [phone]; Fax: [phone]

## 2015-10-20 DIAGNOSIS — I4891 Unspecified atrial fibrillation: Secondary | ICD-10-CM | POA: Diagnosis not present

## 2015-10-20 DIAGNOSIS — Z7901 Long term (current) use of anticoagulants: Secondary | ICD-10-CM | POA: Diagnosis not present

## 2015-11-30 DIAGNOSIS — M170 Bilateral primary osteoarthritis of knee: Secondary | ICD-10-CM | POA: Diagnosis not present

## 2015-11-30 DIAGNOSIS — R262 Difficulty in walking, not elsewhere classified: Secondary | ICD-10-CM | POA: Diagnosis not present

## 2015-11-30 DIAGNOSIS — M25561 Pain in right knee: Secondary | ICD-10-CM | POA: Diagnosis not present

## 2015-11-30 DIAGNOSIS — M1711 Unilateral primary osteoarthritis, right knee: Secondary | ICD-10-CM | POA: Diagnosis not present

## 2015-12-03 ENCOUNTER — Ambulatory Visit (HOSPITAL_COMMUNITY): Payer: PPO | Attending: Internal Medicine

## 2015-12-03 ENCOUNTER — Other Ambulatory Visit: Payer: Self-pay

## 2015-12-03 DIAGNOSIS — I052 Rheumatic mitral stenosis with insufficiency: Secondary | ICD-10-CM | POA: Diagnosis not present

## 2015-12-03 DIAGNOSIS — Z8572 Personal history of non-Hodgkin lymphomas: Secondary | ICD-10-CM | POA: Insufficient documentation

## 2015-12-03 DIAGNOSIS — E119 Type 2 diabetes mellitus without complications: Secondary | ICD-10-CM | POA: Insufficient documentation

## 2015-12-03 DIAGNOSIS — I110 Hypertensive heart disease with heart failure: Secondary | ICD-10-CM | POA: Diagnosis not present

## 2015-12-03 DIAGNOSIS — I509 Heart failure, unspecified: Secondary | ICD-10-CM | POA: Diagnosis not present

## 2015-12-03 DIAGNOSIS — I350 Nonrheumatic aortic (valve) stenosis: Secondary | ICD-10-CM | POA: Diagnosis not present

## 2015-12-03 DIAGNOSIS — Z8249 Family history of ischemic heart disease and other diseases of the circulatory system: Secondary | ICD-10-CM | POA: Insufficient documentation

## 2015-12-03 DIAGNOSIS — I071 Rheumatic tricuspid insufficiency: Secondary | ICD-10-CM | POA: Diagnosis not present

## 2015-12-03 DIAGNOSIS — I358 Other nonrheumatic aortic valve disorders: Secondary | ICD-10-CM | POA: Insufficient documentation

## 2015-12-03 DIAGNOSIS — E785 Hyperlipidemia, unspecified: Secondary | ICD-10-CM | POA: Insufficient documentation

## 2015-12-07 DIAGNOSIS — M1711 Unilateral primary osteoarthritis, right knee: Secondary | ICD-10-CM | POA: Diagnosis not present

## 2015-12-07 DIAGNOSIS — R262 Difficulty in walking, not elsewhere classified: Secondary | ICD-10-CM | POA: Diagnosis not present

## 2015-12-07 DIAGNOSIS — M25561 Pain in right knee: Secondary | ICD-10-CM | POA: Diagnosis not present

## 2015-12-08 ENCOUNTER — Other Ambulatory Visit: Payer: Self-pay | Admitting: Physician Assistant

## 2015-12-08 NOTE — Telephone Encounter (Signed)
Review for refill. 

## 2015-12-14 DIAGNOSIS — M25561 Pain in right knee: Secondary | ICD-10-CM | POA: Diagnosis not present

## 2015-12-14 DIAGNOSIS — M1711 Unilateral primary osteoarthritis, right knee: Secondary | ICD-10-CM | POA: Diagnosis not present

## 2015-12-21 DIAGNOSIS — M1711 Unilateral primary osteoarthritis, right knee: Secondary | ICD-10-CM | POA: Diagnosis not present

## 2015-12-21 DIAGNOSIS — M25561 Pain in right knee: Secondary | ICD-10-CM | POA: Diagnosis not present

## 2015-12-28 DIAGNOSIS — M25561 Pain in right knee: Secondary | ICD-10-CM | POA: Diagnosis not present

## 2015-12-28 DIAGNOSIS — M1711 Unilateral primary osteoarthritis, right knee: Secondary | ICD-10-CM | POA: Diagnosis not present

## 2016-01-04 DIAGNOSIS — M1711 Unilateral primary osteoarthritis, right knee: Secondary | ICD-10-CM | POA: Diagnosis not present

## 2016-01-04 DIAGNOSIS — M25561 Pain in right knee: Secondary | ICD-10-CM | POA: Diagnosis not present

## 2016-01-12 DIAGNOSIS — H3530 Unspecified macular degeneration: Secondary | ICD-10-CM | POA: Diagnosis not present

## 2016-01-12 DIAGNOSIS — E6609 Other obesity due to excess calories: Secondary | ICD-10-CM | POA: Diagnosis not present

## 2016-01-12 DIAGNOSIS — Z7984 Long term (current) use of oral hypoglycemic drugs: Secondary | ICD-10-CM | POA: Diagnosis not present

## 2016-01-12 DIAGNOSIS — I10 Essential (primary) hypertension: Secondary | ICD-10-CM | POA: Diagnosis not present

## 2016-01-12 DIAGNOSIS — I4891 Unspecified atrial fibrillation: Secondary | ICD-10-CM | POA: Diagnosis not present

## 2016-01-12 DIAGNOSIS — Z23 Encounter for immunization: Secondary | ICD-10-CM | POA: Diagnosis not present

## 2016-01-12 DIAGNOSIS — E7800 Pure hypercholesterolemia, unspecified: Secondary | ICD-10-CM | POA: Diagnosis not present

## 2016-01-12 DIAGNOSIS — Z6836 Body mass index (BMI) 36.0-36.9, adult: Secondary | ICD-10-CM | POA: Diagnosis not present

## 2016-01-12 DIAGNOSIS — Z7901 Long term (current) use of anticoagulants: Secondary | ICD-10-CM | POA: Diagnosis not present

## 2016-01-12 DIAGNOSIS — E1140 Type 2 diabetes mellitus with diabetic neuropathy, unspecified: Secondary | ICD-10-CM | POA: Diagnosis not present

## 2016-01-12 DIAGNOSIS — Z8572 Personal history of non-Hodgkin lymphomas: Secondary | ICD-10-CM | POA: Diagnosis not present

## 2016-01-21 DIAGNOSIS — Z7901 Long term (current) use of anticoagulants: Secondary | ICD-10-CM | POA: Diagnosis not present

## 2016-01-21 DIAGNOSIS — I4891 Unspecified atrial fibrillation: Secondary | ICD-10-CM | POA: Diagnosis not present

## 2016-02-04 DIAGNOSIS — Z7901 Long term (current) use of anticoagulants: Secondary | ICD-10-CM | POA: Diagnosis not present

## 2016-02-04 DIAGNOSIS — I4891 Unspecified atrial fibrillation: Secondary | ICD-10-CM | POA: Diagnosis not present

## 2016-02-29 ENCOUNTER — Encounter: Payer: Self-pay | Admitting: Interventional Cardiology

## 2016-03-03 DIAGNOSIS — Z7901 Long term (current) use of anticoagulants: Secondary | ICD-10-CM | POA: Diagnosis not present

## 2016-03-06 NOTE — Progress Notes (Signed)
Patient ID: Gregory [name], male   DOB: [birth year], 75 y.o.   MRN: [id number]     Cardiology Office Note   Date:  03/07/2016   ID:  Gregory [name], DOB [birth year], MRN [id number]  PCP:  Tawanna Solo, MD    No chief complaint on file.  Diastolic heart failure  Wt Readings from Last 3 Encounters:  03/07/16 261 lb 6.4 oz (118.6 kg)  09/30/15 261 lb 9.6 oz (118.7 kg)  09/07/15 257 lb (116.6 kg)       History of Present Illness: Gregory [name] is a 75 y.o. male  with mitral regurgtation and moderate to severe AS. Does not walk a lot. He does do a lot of yard work. Losing weight due to poor appetite.  Atrial Fibrillation F/U:  knee pain;. c/o Leg edema more at the end of the day.  Denies : Chest pain.  Dizziness.  Orthopnea.  Palpitations.    Nausea with taking metformin twice a day. Better when he takes it only at night.  Using compression stockings as well. Elevating legs.  He treats his leg itch with Ca creme from the dermatoligist. Diagnosed with eczema. He walks in stores sometimes. He is limited mostly by his leg. Does work in the yard.  No sx of severe AS as noted above. He hurt his right knee and thinks he needs an MRI. He has had prior left knee surgery. The right knee limits his walking.  He thinks he tore something in there.    In March 2017, had acute heart failure diastolic, was diuresed.  He lost 40 lbs over the course of 2017.  20 lbs of fluid diuresed and then 20 lb weight loss.  He go tdown to 245 lbs, but put back some weight.  He has stayed away from salt.  He now eats more salads, broiled fish and nuts.  Right knee pain.  May need surgery on the right knee.  He received injections with some type of gel.  No benefit from the injections.  He is trying to avoid surgery.   Most recent echo in 9/17 showeed normal LV function with moderate AS, mean grad 28 mm Hg, mild to moderate mitral regurgitation.  No SHOB or CP.  He works outside without a problem.       Past Medical History:  Diagnosis Date  . 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 Cancer ([place])    NHL  . Diabetes mellitus   . History of kidney stones   . Hypertension   . Other malignant lymphomas, unspecified site, extranodal and solid organ sites    NHL  . Ruptured disk   . Ulcer [facility])     Past Surgical History:  Procedure Laterality Date  . APPENDECTOMY  1967     Current Outpatient Prescriptions  Medication Sig Dispense Refill  . atorvastatin (LIPITOR) 20 MG tablet Take 20 mg by mouth daily at 6 PM.     . diltiazem (CARDIZEM CD) 120 MG 24 hr capsule Take 120 mg by mouth daily at 6 PM.    . furosemide (LASIX) 40 MG tablet TAKE ONE TABLET BY MOUTH ONCE DAILY 90 tablet 1  . glimepiride (AMARYL) 4 MG tablet Take 4 mg by mouth daily with breakfast.     . metFORMIN (GLUCOPHAGE-XR) 500 MG 24 hr tablet Take 500 mg by mouth 2 (two) times daily.     . metoprolol succinate (TOPROL-XL) 100 MG  24 hr tablet Take 100 mg by mouth daily. Take with or immediately following a meal.    . Multiple Vitamins-Minerals (PRESERVISION AREDS 2 PO) Take 1 tablet by mouth daily.    . ONE TOUCH ULTRA TEST test strip 1 each by Other route as needed (BLOOD SUGAR).     Glory Rosebush DELICA LANCETS 99991111 MISC Apply 1 each topically daily as needed (BLOOD SUGAR).     Marland Kitchen pioglitazone (ACTOS) 45 MG tablet Take 45 mg by mouth daily.    . Potassium Chloride ER 20 MEQ TBCR TAKE ONE TABLET BY MOUTH ONCE DAILY 90 tablet 1  . warfarin (COUMADIN) 6 MG tablet Take 6 mg by mouth daily at 6 PM. 3 mg on Thursday, 6 mg all other days     No current facility-administered medications for this visit.     Allergies:   Bisoprolol-hydrochlorothiazide and Nsaids    Social History:  The patient  reports that he has never smoked. He has never used smokeless tobacco. He reports that he does not drink alcohol or use drugs.   Family History:  The patient's family history includes Cancer in his mother;  Heart attack in his father and mother; Hypertension in his father.    ROS:  Please see the history of present illness.   Otherwise, review of systems are positive for joint pains, swelling.   All other systems are reviewed and negative.    PHYSICAL EXAM: VS:  BP 132/60   Pulse 72   Ht 5' 10.5" (1.791 m)   Wt 261 lb 6.4 oz (118.6 kg)   SpO2 99%   BMI 36.98 kg/m  , BMI Body mass index is 36.98 kg/m. GEN: Well nourished, well developed, in no acute distress  HEENT: normal  Neck: no JVD, carotid bruits, or masses Cardiac: irregularly irregular; 3/6 harsh systolic murmur, no rubs, or gallops, mild bilateral leg edema  Respiratory:  clear to auscultation bilaterally, normal work of breathing GI: soft, nontender, nondistended, + BS MS: no deformity or atrophy  Skin: warm and dry, no rash Neuro:  Strength and sensation are intact Psych: euthymic mood, full affect     Recent Labs: 06/13/2015: ALT 16; Hemoglobin 11.2; Platelets 233 06/14/2015: TSH 1.949 06/15/2015: B Natriuretic Peptide 106.6; BUN 17; Creatinine, Ser 1.07; Potassium 4.1; Sodium 137   Lipid Panel No results found for: CHOL, TRIG, HDL, CHOLHDL, VLDL, LDLCALC, LDLDIRECT   Other studies Reviewed: Additional studies/ records that were reviewed today with results demonstrating: 2017 echo: moderate  AS. Normal LV function.   ASSESSMENT AND PLAN:  1. AFib: Coumadin for stroke prevention. Rate controlled.  Checked with Dr. Sabra Heck. 2.5 at last check.   2. Chronic diastolic heart failure: Continue Lasix.  Appears to be euvolemic.   Electrolytes with Dr. Sabra Heck stable. 3. Aortic stenosis: moderate by most recent echo.  No sx at this time.   4. Hyperlipidemia : Continue atorvastatin.  Checked by Dr. Sabra Heck. LDL 65 IN 10/17 5. HTN: Controlled today.  Continue current meds.  Avoid salt intake.  Continue dietary changes.  6. Obesity: Lost some weight.  Encouraged him to try to get 150 mintues of stationary bike or walking/week.   He works on his land- 2 acres. 7. Mitral regurg: mild to moderate by echo.  8. A1C controlled.  6.6 IN 10/17.   Current medicines are reviewed at length with the patient today.  The patient concerns regarding his medicines were addressed.  The following changes have been made:  No change  Labs/ tests ordered today include:  No orders of the defined types were placed in this encounter.   Recommend 150 minutes/week of aerobic exercise Low fat, low carb, high fiber diet recommended  Disposition:   FU in 9 months   Signed, Larae Grooms, MD  03/07/2016 1:58 PM    [facility] [address], [place], [place]  [address] Phone: [phone]; Fax: [phone]

## 2016-03-07 ENCOUNTER — Ambulatory Visit (INDEPENDENT_AMBULATORY_CARE_PROVIDER_SITE_OTHER): Payer: PPO | Admitting: Interventional Cardiology

## 2016-03-07 ENCOUNTER — Encounter: Payer: Self-pay | Admitting: Interventional Cardiology

## 2016-03-07 VITALS — BP 132/60 | HR 72 | Ht 70.5 in | Wt 261.4 lb

## 2016-03-07 DIAGNOSIS — E785 Hyperlipidemia, unspecified: Secondary | ICD-10-CM

## 2016-03-07 DIAGNOSIS — E782 Mixed hyperlipidemia: Secondary | ICD-10-CM | POA: Diagnosis not present

## 2016-03-07 DIAGNOSIS — I481 Persistent atrial fibrillation: Secondary | ICD-10-CM | POA: Diagnosis not present

## 2016-03-07 DIAGNOSIS — I350 Nonrheumatic aortic (valve) stenosis: Secondary | ICD-10-CM | POA: Diagnosis not present

## 2016-03-07 DIAGNOSIS — E1169 Type 2 diabetes mellitus with other specified complication: Secondary | ICD-10-CM

## 2016-03-07 DIAGNOSIS — I4819 Other persistent atrial fibrillation: Secondary | ICD-10-CM

## 2016-03-07 DIAGNOSIS — I10 Essential (primary) hypertension: Secondary | ICD-10-CM

## 2016-03-07 NOTE — Patient Instructions (Signed)
**Note De-identified Khaled Herda Obfuscation** Medication Instructions:  Same-no changes  Labwork: None  Testing/Procedures: None  Follow-Up: Your physician wants you to follow-up in: 9 months. You will receive a reminder letter in the mail two months in advance. If you don't receive a letter, please call our office to schedule the follow-up appointment.     If you need a refill on your cardiac medications before your next appointment, please call your pharmacy.   

## 2016-03-10 DIAGNOSIS — H25013 Cortical age-related cataract, bilateral: Secondary | ICD-10-CM | POA: Diagnosis not present

## 2016-03-10 DIAGNOSIS — E119 Type 2 diabetes mellitus without complications: Secondary | ICD-10-CM | POA: Diagnosis not present

## 2016-03-10 DIAGNOSIS — H2513 Age-related nuclear cataract, bilateral: Secondary | ICD-10-CM | POA: Diagnosis not present

## 2016-03-10 DIAGNOSIS — H353132 Nonexudative age-related macular degeneration, bilateral, intermediate dry stage: Secondary | ICD-10-CM | POA: Diagnosis not present

## 2016-04-04 DIAGNOSIS — Z7901 Long term (current) use of anticoagulants: Secondary | ICD-10-CM | POA: Diagnosis not present

## 2016-04-04 DIAGNOSIS — I4891 Unspecified atrial fibrillation: Secondary | ICD-10-CM | POA: Diagnosis not present

## 2016-04-10 DIAGNOSIS — M1711 Unilateral primary osteoarthritis, right knee: Secondary | ICD-10-CM | POA: Diagnosis not present

## 2016-04-10 DIAGNOSIS — M25561 Pain in right knee: Secondary | ICD-10-CM | POA: Diagnosis not present

## 2016-05-24 DIAGNOSIS — J0100 Acute maxillary sinusitis, unspecified: Secondary | ICD-10-CM | POA: Diagnosis not present

## 2016-06-21 ENCOUNTER — Other Ambulatory Visit: Payer: Self-pay | Admitting: Physician Assistant

## 2016-06-21 NOTE — Telephone Encounter (Signed)
Refill Request.  

## 2016-06-27 DIAGNOSIS — E6609 Other obesity due to excess calories: Secondary | ICD-10-CM | POA: Diagnosis not present

## 2016-06-27 DIAGNOSIS — Z7984 Long term (current) use of oral hypoglycemic drugs: Secondary | ICD-10-CM | POA: Diagnosis not present

## 2016-06-27 DIAGNOSIS — E7800 Pure hypercholesterolemia, unspecified: Secondary | ICD-10-CM | POA: Diagnosis not present

## 2016-06-27 DIAGNOSIS — E1140 Type 2 diabetes mellitus with diabetic neuropathy, unspecified: Secondary | ICD-10-CM | POA: Diagnosis not present

## 2016-06-27 DIAGNOSIS — I10 Essential (primary) hypertension: Secondary | ICD-10-CM | POA: Diagnosis not present

## 2016-06-27 DIAGNOSIS — Z6838 Body mass index (BMI) 38.0-38.9, adult: Secondary | ICD-10-CM | POA: Diagnosis not present

## 2016-06-27 DIAGNOSIS — I4891 Unspecified atrial fibrillation: Secondary | ICD-10-CM | POA: Diagnosis not present

## 2016-06-27 DIAGNOSIS — Z7901 Long term (current) use of anticoagulants: Secondary | ICD-10-CM | POA: Diagnosis not present

## 2016-07-12 DIAGNOSIS — M1711 Unilateral primary osteoarthritis, right knee: Secondary | ICD-10-CM | POA: Diagnosis not present

## 2016-07-12 DIAGNOSIS — M25561 Pain in right knee: Secondary | ICD-10-CM | POA: Diagnosis not present

## 2016-07-19 DIAGNOSIS — M1711 Unilateral primary osteoarthritis, right knee: Secondary | ICD-10-CM | POA: Diagnosis not present

## 2016-07-19 DIAGNOSIS — M25561 Pain in right knee: Secondary | ICD-10-CM | POA: Diagnosis not present

## 2016-09-19 DIAGNOSIS — Z7901 Long term (current) use of anticoagulants: Secondary | ICD-10-CM | POA: Diagnosis not present

## 2016-09-19 DIAGNOSIS — I4891 Unspecified atrial fibrillation: Secondary | ICD-10-CM | POA: Diagnosis not present

## 2016-09-21 DIAGNOSIS — H35363 Drusen (degenerative) of macula, bilateral: Secondary | ICD-10-CM | POA: Diagnosis not present

## 2016-09-21 DIAGNOSIS — H353132 Nonexudative age-related macular degeneration, bilateral, intermediate dry stage: Secondary | ICD-10-CM | POA: Diagnosis not present

## 2016-09-21 DIAGNOSIS — H25013 Cortical age-related cataract, bilateral: Secondary | ICD-10-CM | POA: Diagnosis not present

## 2016-10-15 DIAGNOSIS — M25561 Pain in right knee: Secondary | ICD-10-CM | POA: Diagnosis not present

## 2016-12-29 DIAGNOSIS — I10 Essential (primary) hypertension: Secondary | ICD-10-CM | POA: Diagnosis not present

## 2016-12-29 DIAGNOSIS — Z7984 Long term (current) use of oral hypoglycemic drugs: Secondary | ICD-10-CM | POA: Diagnosis not present

## 2016-12-29 DIAGNOSIS — Z23 Encounter for immunization: Secondary | ICD-10-CM | POA: Diagnosis not present

## 2016-12-29 DIAGNOSIS — I350 Nonrheumatic aortic (valve) stenosis: Secondary | ICD-10-CM | POA: Diagnosis not present

## 2016-12-29 DIAGNOSIS — Z7901 Long term (current) use of anticoagulants: Secondary | ICD-10-CM | POA: Diagnosis not present

## 2016-12-29 DIAGNOSIS — E7800 Pure hypercholesterolemia, unspecified: Secondary | ICD-10-CM | POA: Diagnosis not present

## 2016-12-29 DIAGNOSIS — M1711 Unilateral primary osteoarthritis, right knee: Secondary | ICD-10-CM | POA: Diagnosis not present

## 2016-12-29 DIAGNOSIS — E6609 Other obesity due to excess calories: Secondary | ICD-10-CM | POA: Diagnosis not present

## 2016-12-29 DIAGNOSIS — E1140 Type 2 diabetes mellitus with diabetic neuropathy, unspecified: Secondary | ICD-10-CM | POA: Diagnosis not present

## 2016-12-29 DIAGNOSIS — Z6838 Body mass index (BMI) 38.0-38.9, adult: Secondary | ICD-10-CM | POA: Diagnosis not present

## 2017-01-12 DIAGNOSIS — Z7901 Long term (current) use of anticoagulants: Secondary | ICD-10-CM | POA: Diagnosis not present

## 2017-02-02 DIAGNOSIS — Z7901 Long term (current) use of anticoagulants: Secondary | ICD-10-CM | POA: Diagnosis not present

## 2017-03-02 DIAGNOSIS — I4891 Unspecified atrial fibrillation: Secondary | ICD-10-CM | POA: Diagnosis not present

## 2017-03-02 DIAGNOSIS — Z7901 Long term (current) use of anticoagulants: Secondary | ICD-10-CM | POA: Diagnosis not present

## 2017-04-10 ENCOUNTER — Encounter: Payer: Self-pay | Admitting: Interventional Cardiology

## 2017-04-10 ENCOUNTER — Ambulatory Visit: Payer: PPO | Admitting: Interventional Cardiology

## 2017-04-10 VITALS — BP 128/78 | HR 73 | Ht 70.5 in | Wt 273.0 lb

## 2017-04-10 DIAGNOSIS — I5032 Chronic diastolic (congestive) heart failure: Secondary | ICD-10-CM

## 2017-04-10 DIAGNOSIS — E782 Mixed hyperlipidemia: Secondary | ICD-10-CM

## 2017-04-10 DIAGNOSIS — Z6838 Body mass index (BMI) 38.0-38.9, adult: Secondary | ICD-10-CM

## 2017-04-10 DIAGNOSIS — I481 Persistent atrial fibrillation: Secondary | ICD-10-CM | POA: Diagnosis not present

## 2017-04-10 DIAGNOSIS — I350 Nonrheumatic aortic (valve) stenosis: Secondary | ICD-10-CM

## 2017-04-10 DIAGNOSIS — I4819 Other persistent atrial fibrillation: Secondary | ICD-10-CM

## 2017-04-10 NOTE — Patient Instructions (Signed)

## 2017-04-10 NOTE — Progress Notes (Signed)
Cardiology Office Note   Date:  04/10/2017   ID:  Gregory [name], DOB 06-09-1940, MRN [id number]  PCP:  Kathyrn Lass, MD    No chief complaint on file. Aortic stenosis   Wt Readings from Last 3 Encounters:  04/10/17 273 lb (123.8 kg)  03/07/16 261 lb 6.4 oz (118.6 kg)  09/30/15 261 lb 9.6 oz (118.7 kg)       History of Present Illness: Gregory [name] is a 77 y.o. male  with mitral regurgtation and moderate to severe AS.   In March 2017, had acute heart failure diastolic, asa diuresed.  He is now 40 lbs lighter than he was in 2/17.  20 lbs of fluid diuresed and then 20 lb weight loss.   He reduced salt in his diet.  He has chronic knee pain that limits his walking.    Denies : Chest pain. Dizziness. Leg edema. Nitroglycerin use. Orthopnea. Palpitations. Paroxysmal nocturnal dyspnea. Shortness of breath. Syncope.   He takes his lasix regularly.    Last echo [id number] showed: Compared to the prior study in 06/2015, the LVEF has improved to   60-65%. There is stable moderate aortic valve stenosis and mild   mitral valve stenosis, demonstrating changes which could be   consistent with mantle radiation exposure to the chest for   Non-Hodgkin&'s lymphoma.   He shoveled snow a month ago.  No problems with that activity in Dec 2018.     Past Medical History:  Diagnosis Date  . 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 Cancer ([place])    NHL  . Diabetes mellitus   . History of kidney stones   . Hypertension   . Other malignant lymphomas, unspecified site, extranodal and solid organ sites    NHL  . Ruptured disk   . Ulcer     Past Surgical History:  Procedure Laterality Date  . APPENDECTOMY  1967     Current Outpatient Medications  Medication Sig Dispense Refill  . atorvastatin (LIPITOR) 20 MG tablet Take 20 mg by mouth daily at 6 PM.     . diltiazem (CARDIZEM CD) 120 MG 24 hr capsule Take 120 mg by mouth daily at 6 PM.    . furosemide (LASIX)  40 MG tablet TAKE ONE TABLET BY MOUTH ONCE DAILY 90 tablet 2  . glimepiride (AMARYL) 4 MG tablet Take 4 mg by mouth daily with breakfast.     . metFORMIN (GLUCOPHAGE-XR) 500 MG 24 hr tablet Take 500 mg by mouth 2 (two) times daily.     . metoprolol succinate (TOPROL-XL) 100 MG 24 hr tablet Take 100 mg by mouth daily. Take with or immediately following a meal.    . Multiple Vitamins-Minerals (PRESERVISION AREDS 2 PO) Take 1 tablet by mouth daily.    . ONE TOUCH ULTRA TEST test strip 1 each by Other route as needed (BLOOD SUGAR).     Glory Rosebush DELICA LANCETS 91Y MISC Apply 1 each topically daily as needed (BLOOD SUGAR).     Marland Kitchen pioglitazone (ACTOS) 45 MG tablet Take 45 mg by mouth daily.    . Potassium Chloride ER 20 MEQ TBCR TAKE ONE TABLET BY MOUTH ONCE DAILY 90 tablet 2  . warfarin (COUMADIN) 6 MG tablet Take 6 mg by mouth daily at 6 PM. 3 mg on Thursday, 6 mg all other days     No current facility-administered medications for this visit.  Allergies:   Bisoprolol-hydrochlorothiazide and Nsaids    Social History:  The patient  reports that  has never smoked. he has never used smokeless tobacco. He reports that he does not drink alcohol or use drugs.   Family History:  The patient's family history includes Cancer in his mother; Heart attack in his father and mother; Hypertension in his father.    ROS:  Please see the history of present illness.   Otherwise, review of systems are positive for knee pain.   All other systems are reviewed and negative.    PHYSICAL EXAM: VS:  BP 128/78   Pulse 73   Ht 5' 10.5" (1.791 m)   Wt 273 lb (123.8 kg)   SpO2 92%   BMI 38.62 kg/m  , BMI Body mass index is 38.62 kg/m. GEN: Well nourished, well developed, in no acute distress  HEENT: normal  Neck: no JVD, carotid bruits, or masses Cardiac: irregularly irregular; no murmurs, rubs, or gallops,; tr bilateral LE edema  Respiratory:  clear to auscultation bilaterally, normal work of breathing GI:  soft, nontender, nondistended, + BS, obese MS: no deformity or atrophy  Skin: warm and dry, no rash Neuro:  Strength and sensation are intact Psych: euthymic mood, full affect   EKG:   The ekg ordered today demonstrates AFib, rate controlled. RBBB   Recent Labs: No results found for requested labs within last 8760 hours.   Lipid Panel No results found for: CHOL, TRIG, HDL, CHOLHDL, VLDL, LDLCALC, LDLDIRECT   Other studies Reviewed: Additional studies/ records that were reviewed today with results demonstrating: 2017 echo reviewed.   ASSESSMENT AND PLAN:  1. AFib: Rate controlled. Warfarin for stroke prevention. COntinue current meds. 2. Chronic diastolic heart failure: Appears euvolemic. 3. Aortic stenosis: Moderate to severe. No need for echo at this time.  If he pursues TKR, would plan on echo prior to surgery.  No sx of severe AS at this time.  4. Hyperlipidemia: LDL 57 in 9/18.  Continue atorvastatin.  5. Mitral regurgitation: Mild at the last echo.   6. Obesity: Trying to improve diet will help lose some of the pounds gained on the holidays.   7. DM: On Actos.  Watch for signs of heart failure.   Current medicines are reviewed at length with the patient today.  The patient concerns regarding his medicines were addressed.  The following changes have been made:  No change  Labs/ tests ordered today include:  No orders of the defined types were placed in this encounter.   Recommend 150 minutes/week of aerobic exercise Low fat, low carb, high fiber diet recommended  Disposition:   FU in 1 year   Signed, Larae Grooms, MD  04/10/2017 4:30 PM    [facility] [address], [place], [place]  [address] Phone: [phone]; Fax: [phone]

## 2017-04-12 DIAGNOSIS — H353132 Nonexudative age-related macular degeneration, bilateral, intermediate dry stage: Secondary | ICD-10-CM | POA: Diagnosis not present

## 2017-04-12 DIAGNOSIS — H25013 Cortical age-related cataract, bilateral: Secondary | ICD-10-CM | POA: Diagnosis not present

## 2017-04-12 DIAGNOSIS — E119 Type 2 diabetes mellitus without complications: Secondary | ICD-10-CM | POA: Diagnosis not present

## 2017-04-12 DIAGNOSIS — H11153 Pinguecula, bilateral: Secondary | ICD-10-CM | POA: Diagnosis not present

## 2017-05-01 ENCOUNTER — Other Ambulatory Visit: Payer: Self-pay | Admitting: Interventional Cardiology

## 2017-05-01 MED ORDER — FUROSEMIDE 40 MG PO TABS: 40.0000 mg | ORAL_TABLET | Freq: Every day | ORAL | 3 refills | Status: DC

## 2017-05-02 DIAGNOSIS — Z7901 Long term (current) use of anticoagulants: Secondary | ICD-10-CM | POA: Diagnosis not present

## 2017-05-02 DIAGNOSIS — I4891 Unspecified atrial fibrillation: Secondary | ICD-10-CM | POA: Diagnosis not present

## 2017-07-04 DIAGNOSIS — M1711 Unilateral primary osteoarthritis, right knee: Secondary | ICD-10-CM | POA: Diagnosis not present

## 2017-07-04 DIAGNOSIS — E7800 Pure hypercholesterolemia, unspecified: Secondary | ICD-10-CM | POA: Diagnosis not present

## 2017-07-04 DIAGNOSIS — Z7901 Long term (current) use of anticoagulants: Secondary | ICD-10-CM | POA: Diagnosis not present

## 2017-07-04 DIAGNOSIS — I4891 Unspecified atrial fibrillation: Secondary | ICD-10-CM | POA: Diagnosis not present

## 2017-07-04 DIAGNOSIS — E1140 Type 2 diabetes mellitus with diabetic neuropathy, unspecified: Secondary | ICD-10-CM | POA: Diagnosis not present

## 2017-07-04 DIAGNOSIS — I10 Essential (primary) hypertension: Secondary | ICD-10-CM | POA: Diagnosis not present

## 2017-07-18 DIAGNOSIS — I4891 Unspecified atrial fibrillation: Secondary | ICD-10-CM | POA: Diagnosis not present

## 2017-07-18 DIAGNOSIS — Z7901 Long term (current) use of anticoagulants: Secondary | ICD-10-CM | POA: Diagnosis not present

## 2017-07-29 ENCOUNTER — Other Ambulatory Visit: Payer: Self-pay | Admitting: Physician Assistant

## 2017-07-31 NOTE — Telephone Encounter (Signed)
Okay to refill under Dr Irish Lack? Please advise. Thanks, MI

## 2017-08-17 DIAGNOSIS — I4891 Unspecified atrial fibrillation: Secondary | ICD-10-CM | POA: Diagnosis not present

## 2017-08-17 DIAGNOSIS — Z7901 Long term (current) use of anticoagulants: Secondary | ICD-10-CM | POA: Diagnosis not present

## 2017-08-30 DIAGNOSIS — M1711 Unilateral primary osteoarthritis, right knee: Secondary | ICD-10-CM | POA: Diagnosis not present

## 2017-08-30 DIAGNOSIS — M25561 Pain in right knee: Secondary | ICD-10-CM | POA: Diagnosis not present

## 2017-10-01 DIAGNOSIS — M1711 Unilateral primary osteoarthritis, right knee: Secondary | ICD-10-CM | POA: Diagnosis not present

## 2017-10-03 DIAGNOSIS — M9902 Segmental and somatic dysfunction of thoracic region: Secondary | ICD-10-CM | POA: Diagnosis not present

## 2017-10-03 DIAGNOSIS — M5136 Other intervertebral disc degeneration, lumbar region: Secondary | ICD-10-CM | POA: Diagnosis not present

## 2017-10-03 DIAGNOSIS — M9903 Segmental and somatic dysfunction of lumbar region: Secondary | ICD-10-CM | POA: Diagnosis not present

## 2017-10-03 DIAGNOSIS — M9904 Segmental and somatic dysfunction of sacral region: Secondary | ICD-10-CM | POA: Diagnosis not present

## 2017-10-03 DIAGNOSIS — M5137 Other intervertebral disc degeneration, lumbosacral region: Secondary | ICD-10-CM | POA: Diagnosis not present

## 2017-10-03 DIAGNOSIS — M5134 Other intervertebral disc degeneration, thoracic region: Secondary | ICD-10-CM | POA: Diagnosis not present

## 2017-10-08 DIAGNOSIS — M5134 Other intervertebral disc degeneration, thoracic region: Secondary | ICD-10-CM | POA: Diagnosis not present

## 2017-10-08 DIAGNOSIS — M9904 Segmental and somatic dysfunction of sacral region: Secondary | ICD-10-CM | POA: Diagnosis not present

## 2017-10-08 DIAGNOSIS — M5137 Other intervertebral disc degeneration, lumbosacral region: Secondary | ICD-10-CM | POA: Diagnosis not present

## 2017-10-08 DIAGNOSIS — M5136 Other intervertebral disc degeneration, lumbar region: Secondary | ICD-10-CM | POA: Diagnosis not present

## 2017-10-08 DIAGNOSIS — M9902 Segmental and somatic dysfunction of thoracic region: Secondary | ICD-10-CM | POA: Diagnosis not present

## 2017-10-08 DIAGNOSIS — M9903 Segmental and somatic dysfunction of lumbar region: Secondary | ICD-10-CM | POA: Diagnosis not present

## 2017-10-10 DIAGNOSIS — M1711 Unilateral primary osteoarthritis, right knee: Secondary | ICD-10-CM | POA: Diagnosis not present

## 2017-10-11 DIAGNOSIS — H353132 Nonexudative age-related macular degeneration, bilateral, intermediate dry stage: Secondary | ICD-10-CM | POA: Diagnosis not present

## 2017-10-11 DIAGNOSIS — M9904 Segmental and somatic dysfunction of sacral region: Secondary | ICD-10-CM | POA: Diagnosis not present

## 2017-10-11 DIAGNOSIS — M5137 Other intervertebral disc degeneration, lumbosacral region: Secondary | ICD-10-CM | POA: Diagnosis not present

## 2017-10-11 DIAGNOSIS — M5136 Other intervertebral disc degeneration, lumbar region: Secondary | ICD-10-CM | POA: Diagnosis not present

## 2017-10-11 DIAGNOSIS — M5134 Other intervertebral disc degeneration, thoracic region: Secondary | ICD-10-CM | POA: Diagnosis not present

## 2017-10-11 DIAGNOSIS — M9903 Segmental and somatic dysfunction of lumbar region: Secondary | ICD-10-CM | POA: Diagnosis not present

## 2017-10-11 DIAGNOSIS — M9902 Segmental and somatic dysfunction of thoracic region: Secondary | ICD-10-CM | POA: Diagnosis not present

## 2017-10-12 DIAGNOSIS — I4891 Unspecified atrial fibrillation: Secondary | ICD-10-CM | POA: Diagnosis not present

## 2017-10-12 DIAGNOSIS — Z7901 Long term (current) use of anticoagulants: Secondary | ICD-10-CM | POA: Diagnosis not present

## 2017-10-17 DIAGNOSIS — M9904 Segmental and somatic dysfunction of sacral region: Secondary | ICD-10-CM | POA: Diagnosis not present

## 2017-10-17 DIAGNOSIS — M5137 Other intervertebral disc degeneration, lumbosacral region: Secondary | ICD-10-CM | POA: Diagnosis not present

## 2017-10-17 DIAGNOSIS — M9902 Segmental and somatic dysfunction of thoracic region: Secondary | ICD-10-CM | POA: Diagnosis not present

## 2017-10-17 DIAGNOSIS — M5134 Other intervertebral disc degeneration, thoracic region: Secondary | ICD-10-CM | POA: Diagnosis not present

## 2017-10-17 DIAGNOSIS — M9903 Segmental and somatic dysfunction of lumbar region: Secondary | ICD-10-CM | POA: Diagnosis not present

## 2017-10-17 DIAGNOSIS — M5136 Other intervertebral disc degeneration, lumbar region: Secondary | ICD-10-CM | POA: Diagnosis not present

## 2017-10-18 DIAGNOSIS — M1711 Unilateral primary osteoarthritis, right knee: Secondary | ICD-10-CM | POA: Diagnosis not present

## 2017-10-22 DIAGNOSIS — M9902 Segmental and somatic dysfunction of thoracic region: Secondary | ICD-10-CM | POA: Diagnosis not present

## 2017-10-22 DIAGNOSIS — M5134 Other intervertebral disc degeneration, thoracic region: Secondary | ICD-10-CM | POA: Diagnosis not present

## 2017-10-22 DIAGNOSIS — M5136 Other intervertebral disc degeneration, lumbar region: Secondary | ICD-10-CM | POA: Diagnosis not present

## 2017-10-22 DIAGNOSIS — M9903 Segmental and somatic dysfunction of lumbar region: Secondary | ICD-10-CM | POA: Diagnosis not present

## 2017-10-22 DIAGNOSIS — M5137 Other intervertebral disc degeneration, lumbosacral region: Secondary | ICD-10-CM | POA: Diagnosis not present

## 2017-10-22 DIAGNOSIS — M9904 Segmental and somatic dysfunction of sacral region: Secondary | ICD-10-CM | POA: Diagnosis not present

## 2017-10-26 DIAGNOSIS — M9902 Segmental and somatic dysfunction of thoracic region: Secondary | ICD-10-CM | POA: Diagnosis not present

## 2017-10-26 DIAGNOSIS — M5134 Other intervertebral disc degeneration, thoracic region: Secondary | ICD-10-CM | POA: Diagnosis not present

## 2017-10-26 DIAGNOSIS — M5137 Other intervertebral disc degeneration, lumbosacral region: Secondary | ICD-10-CM | POA: Diagnosis not present

## 2017-10-26 DIAGNOSIS — M5136 Other intervertebral disc degeneration, lumbar region: Secondary | ICD-10-CM | POA: Diagnosis not present

## 2017-10-26 DIAGNOSIS — M9903 Segmental and somatic dysfunction of lumbar region: Secondary | ICD-10-CM | POA: Diagnosis not present

## 2017-10-26 DIAGNOSIS — M9904 Segmental and somatic dysfunction of sacral region: Secondary | ICD-10-CM | POA: Diagnosis not present

## 2017-10-31 DIAGNOSIS — M9904 Segmental and somatic dysfunction of sacral region: Secondary | ICD-10-CM | POA: Diagnosis not present

## 2017-10-31 DIAGNOSIS — M5136 Other intervertebral disc degeneration, lumbar region: Secondary | ICD-10-CM | POA: Diagnosis not present

## 2017-10-31 DIAGNOSIS — M5137 Other intervertebral disc degeneration, lumbosacral region: Secondary | ICD-10-CM | POA: Diagnosis not present

## 2017-10-31 DIAGNOSIS — M9902 Segmental and somatic dysfunction of thoracic region: Secondary | ICD-10-CM | POA: Diagnosis not present

## 2017-10-31 DIAGNOSIS — M5134 Other intervertebral disc degeneration, thoracic region: Secondary | ICD-10-CM | POA: Diagnosis not present

## 2017-10-31 DIAGNOSIS — M9903 Segmental and somatic dysfunction of lumbar region: Secondary | ICD-10-CM | POA: Diagnosis not present

## 2017-11-07 DIAGNOSIS — M5136 Other intervertebral disc degeneration, lumbar region: Secondary | ICD-10-CM | POA: Diagnosis not present

## 2017-11-07 DIAGNOSIS — M9904 Segmental and somatic dysfunction of sacral region: Secondary | ICD-10-CM | POA: Diagnosis not present

## 2017-11-07 DIAGNOSIS — M9903 Segmental and somatic dysfunction of lumbar region: Secondary | ICD-10-CM | POA: Diagnosis not present

## 2017-11-07 DIAGNOSIS — M5137 Other intervertebral disc degeneration, lumbosacral region: Secondary | ICD-10-CM | POA: Diagnosis not present

## 2017-11-07 DIAGNOSIS — M9902 Segmental and somatic dysfunction of thoracic region: Secondary | ICD-10-CM | POA: Diagnosis not present

## 2017-11-07 DIAGNOSIS — M5134 Other intervertebral disc degeneration, thoracic region: Secondary | ICD-10-CM | POA: Diagnosis not present

## 2017-11-29 DIAGNOSIS — M1711 Unilateral primary osteoarthritis, right knee: Secondary | ICD-10-CM | POA: Diagnosis not present

## 2017-12-28 DIAGNOSIS — I4891 Unspecified atrial fibrillation: Secondary | ICD-10-CM | POA: Diagnosis not present

## 2017-12-28 DIAGNOSIS — E7800 Pure hypercholesterolemia, unspecified: Secondary | ICD-10-CM | POA: Diagnosis not present

## 2017-12-28 DIAGNOSIS — Z1389 Encounter for screening for other disorder: Secondary | ICD-10-CM | POA: Diagnosis not present

## 2017-12-28 DIAGNOSIS — I10 Essential (primary) hypertension: Secondary | ICD-10-CM | POA: Diagnosis not present

## 2017-12-28 DIAGNOSIS — Z7901 Long term (current) use of anticoagulants: Secondary | ICD-10-CM | POA: Diagnosis not present

## 2017-12-28 DIAGNOSIS — E1140 Type 2 diabetes mellitus with diabetic neuropathy, unspecified: Secondary | ICD-10-CM | POA: Diagnosis not present

## 2017-12-28 DIAGNOSIS — M1711 Unilateral primary osteoarthritis, right knee: Secondary | ICD-10-CM | POA: Diagnosis not present

## 2017-12-28 DIAGNOSIS — Z23 Encounter for immunization: Secondary | ICD-10-CM | POA: Diagnosis not present

## 2017-12-28 DIAGNOSIS — Z0000 Encounter for general adult medical examination without abnormal findings: Secondary | ICD-10-CM | POA: Diagnosis not present

## 2017-12-28 DIAGNOSIS — Z125 Encounter for screening for malignant neoplasm of prostate: Secondary | ICD-10-CM | POA: Diagnosis not present

## 2018-01-11 DIAGNOSIS — M5136 Other intervertebral disc degeneration, lumbar region: Secondary | ICD-10-CM | POA: Diagnosis not present

## 2018-01-11 DIAGNOSIS — M9902 Segmental and somatic dysfunction of thoracic region: Secondary | ICD-10-CM | POA: Diagnosis not present

## 2018-01-11 DIAGNOSIS — M5134 Other intervertebral disc degeneration, thoracic region: Secondary | ICD-10-CM | POA: Diagnosis not present

## 2018-01-11 DIAGNOSIS — M9903 Segmental and somatic dysfunction of lumbar region: Secondary | ICD-10-CM | POA: Diagnosis not present

## 2018-01-11 DIAGNOSIS — M5137 Other intervertebral disc degeneration, lumbosacral region: Secondary | ICD-10-CM | POA: Diagnosis not present

## 2018-01-11 DIAGNOSIS — M9904 Segmental and somatic dysfunction of sacral region: Secondary | ICD-10-CM | POA: Diagnosis not present

## 2018-01-14 DIAGNOSIS — M9902 Segmental and somatic dysfunction of thoracic region: Secondary | ICD-10-CM | POA: Diagnosis not present

## 2018-01-14 DIAGNOSIS — M5134 Other intervertebral disc degeneration, thoracic region: Secondary | ICD-10-CM | POA: Diagnosis not present

## 2018-01-14 DIAGNOSIS — M9904 Segmental and somatic dysfunction of sacral region: Secondary | ICD-10-CM | POA: Diagnosis not present

## 2018-01-14 DIAGNOSIS — M5137 Other intervertebral disc degeneration, lumbosacral region: Secondary | ICD-10-CM | POA: Diagnosis not present

## 2018-01-14 DIAGNOSIS — M9903 Segmental and somatic dysfunction of lumbar region: Secondary | ICD-10-CM | POA: Diagnosis not present

## 2018-01-14 DIAGNOSIS — M5136 Other intervertebral disc degeneration, lumbar region: Secondary | ICD-10-CM | POA: Diagnosis not present

## 2018-03-22 DIAGNOSIS — Z7901 Long term (current) use of anticoagulants: Secondary | ICD-10-CM | POA: Diagnosis not present

## 2018-03-22 DIAGNOSIS — I4891 Unspecified atrial fibrillation: Secondary | ICD-10-CM | POA: Diagnosis not present

## 2018-04-16 DIAGNOSIS — H353132 Nonexudative age-related macular degeneration, bilateral, intermediate dry stage: Secondary | ICD-10-CM | POA: Diagnosis not present

## 2018-05-23 ENCOUNTER — Telehealth: Payer: Self-pay | Admitting: Interventional Cardiology

## 2018-05-23 ENCOUNTER — Ambulatory Visit: Payer: PPO | Admitting: Interventional Cardiology

## 2018-05-23 ENCOUNTER — Encounter: Payer: Self-pay | Admitting: Interventional Cardiology

## 2018-05-23 VITALS — BP 118/70 | HR 81 | Ht 70.5 in | Wt 269.6 lb

## 2018-05-23 DIAGNOSIS — I4819 Other persistent atrial fibrillation: Secondary | ICD-10-CM

## 2018-05-23 DIAGNOSIS — E782 Mixed hyperlipidemia: Secondary | ICD-10-CM | POA: Diagnosis not present

## 2018-05-23 DIAGNOSIS — I350 Nonrheumatic aortic (valve) stenosis: Secondary | ICD-10-CM | POA: Diagnosis not present

## 2018-05-23 DIAGNOSIS — I5032 Chronic diastolic (congestive) heart failure: Secondary | ICD-10-CM

## 2018-05-23 DIAGNOSIS — E119 Type 2 diabetes mellitus without complications: Secondary | ICD-10-CM | POA: Diagnosis not present

## 2018-05-23 NOTE — Patient Instructions (Signed)
Medication Instructions:  Your physician recommends that you continue on your current medications as directed. Please refer to the Current Medication list given to you today.  If you need a refill on your cardiac medications before your next appointment, please call your pharmacy.   Lab work: None Ordered  If you have labs (blood work) drawn today and your tests are completely normal, you will receive your results only by: . MyChart Message (if you have MyChart) OR . A paper copy in the mail If you have any lab test that is abnormal or we need to change your treatment, we will call you to review the results.  Testing/Procedures: Your physician has requested that you have an echocardiogram. Echocardiography is a painless test that uses sound waves to create images of your heart. It provides your doctor with information about the size and shape of your heart and how well your heart's chambers and valves are working. This procedure takes approximately one hour. There are no restrictions for this procedure.  Follow-Up: At [facility], you and your health needs are our priority.  As part of our continuing mission to provide you with exceptional heart care, we have created designated Provider Care Teams.  These Care Teams include your primary Cardiologist (physician) and Advanced Practice Providers (APPs -  Physician Assistants and Nurse Practitioners) who all work together to provide you with the care you need, when you need it. . You will need a follow up appointment in 1 year.  Please call our office 2 months in advance to schedule this appointment.  You may see Jay Varanasi, MD or one of the following Advanced Practice Providers on your designated Care Team:   . Brittainy Simmons, PA-C . Dayna Dunn, PA-C . Michele Lenze, PA-C  Any Other Special Instructions Will Be Listed Below (If Applicable).    

## 2018-05-23 NOTE — Telephone Encounter (Signed)
Patient had two [facility] pharmacies listed on his chart, I have deleted the other one and left [facility] only.

## 2018-05-23 NOTE — Telephone Encounter (Signed)
New Message    Patient wanted to make sure you have the correct [facility] address for the pharmacy:  [place]

## 2018-05-23 NOTE — Progress Notes (Signed)
Cardiology Office Note   Date:  05/23/2018   ID:  Gregory [name], DOB 18-Jun-1940, MRN [id number]  PCP:  Kathyrn Lass, MD    No chief complaint on file.  Mitral regurgitation/ aortic stenosis  Wt Readings from Last 3 Encounters:  05/23/18 269 lb 9.6 oz (122.3 kg)  04/10/17 273 lb (123.8 kg)  03/07/16 261 lb 6.4 oz (118.6 kg)       History of Present Illness: Gregory [name] is a 78 y.o. male  with mitral regurgtation and moderate to severe AS.   In March 2017, had acute heart failure diastolic, asa diuresed. He is now 40 lbs lighter than he was in 2/17. 20 lbs of fluid diuresed and then 20 lb weight loss.  He reduced salt in his diet.  Denies : Chest pain. Dizziness. Leg edema. Nitroglycerin use. Orthopnea. Palpitations. Paroxysmal nocturnal dyspnea. Shortness of breath. Syncope.   Walking limited by knee pain.  Has received knee injections.   His sister in law died recently from cancer.  THis has been a source of stress.   Past Medical History:  Diagnosis Date  . 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 Cancer ([place])    NHL  . Diabetes mellitus   . History of kidney stones   . Hypertension   . Other malignant lymphomas, unspecified site, extranodal and solid organ sites    NHL  . Ruptured disk   . Ulcer     Past Surgical History:  Procedure Laterality Date  . APPENDECTOMY  1967     Current Outpatient Medications  Medication Sig Dispense Refill  . atorvastatin (LIPITOR) 20 MG tablet Take 20 mg by mouth daily at 6 PM.     . diltiazem (CARDIZEM CD) 120 MG 24 hr capsule Take 120 mg by mouth daily at 6 PM.    . furosemide (LASIX) 40 MG tablet Take 1 tablet (40 mg total) by mouth daily. 90 tablet 3  . glimepiride (AMARYL) 4 MG tablet Take 4 mg by mouth daily with breakfast.     . lisinopril (PRINIVIL,ZESTRIL) 10 MG tablet Take 10 mg by mouth daily.    . metFORMIN (GLUCOPHAGE-XR) 500 MG 24 hr tablet Take 500 mg by mouth 2 (two) times  daily.     . metoprolol succinate (TOPROL-XL) 100 MG 24 hr tablet Take 100 mg by mouth daily. Take with or immediately following a meal.    . Multiple Vitamins-Minerals (PRESERVISION AREDS 2 PO) Take 1 tablet by mouth daily.    . ONE TOUCH ULTRA TEST test strip 1 each by Other route as needed (BLOOD SUGAR).     Glory Rosebush DELICA LANCETS 69G MISC Apply 1 each topically daily as needed (BLOOD SUGAR).     Marland Kitchen pioglitazone (ACTOS) 45 MG tablet Take 45 mg by mouth daily.    . Potassium Chloride ER 20 MEQ TBCR TAKE ONE TABLET BY MOUTH ONCE DAILY 90 tablet 2  . potassium chloride SA (K-DUR,KLOR-CON) 20 MEQ tablet TAKE 1 TABLET BY MOUTH ONCE DAILY 90 tablet 3  . warfarin (COUMADIN) 6 MG tablet Take 6 mg by mouth daily at 6 PM. 3 mg on Thursday, 6 mg all other days     No current facility-administered medications for this visit.     Allergies:   Bisoprolol-hydrochlorothiazide and Nsaids    Social History:  The patient  reports that he has never smoked. He has never used smokeless tobacco. He reports  that he does not drink alcohol or use drugs.   Family History:  The patient's family history includes Cancer in his mother; Heart attack in his father and mother; Hypertension in his father.    ROS:  Please see the history of present illness.   Otherwise, review of systems are positive for knee pain.   All other systems are reviewed and negative.    PHYSICAL EXAM: VS:  BP 118/70   Pulse 81   Ht 5' 10.5" (1.791 m)   Wt 269 lb 9.6 oz (122.3 kg)   SpO2 94%   BMI 38.14 kg/m  , BMI Body mass index is 38.14 kg/m. GEN: Well nourished, well developed, in no acute distress  HEENT: normal  Neck: no JVD, carotid bruits, or masses Cardiac: irregularly irregular; 3/6 systolic murmur, no rubs, or gallops,no edema  Respiratory:  clear to auscultation bilaterally, normal work of breathing GI: soft, nontender, nondistended, + BS MS: no deformity or atrophy  Skin: warm and dry, no rash Neuro:  Strength and  sensation are intact Psych: euthymic mood, full affect   EKG:   The ekg ordered today demonstrates AFib, RBBB   Recent Labs: No results found for requested labs within last 8760 hours.   Lipid Panel No results found for: CHOL, TRIG, HDL, CHOLHDL, VLDL, LDLCALC, LDLDIRECT   Other studies Reviewed: Additional studies/ records that were reviewed today with results demonstrating: LDL 55 in 9/19.   ASSESSMENT AND PLAN:  1. Aortic stenosis:  Plan for echocardiogram.  No sx of severe AS at this time, but murmur is quite loud on exam.  2. AFib: rate controlled. Warfarin for stroke prevention.  3. Mitral regurgitation: No signs of volume overload.  4. Hypertensive heart disease: The current medical regimen is effective;  continue present plan and medications.  He has decreased red meat intake.  5. DM: A1C 7.3.  Difficult to exercise.  Try to eat a healthy diet.   6. Hyperlipidemia: LDL 55 in 9/19. COntinue atorvastatin. 7. Chronic diastolic heart failure: He appears euvolmic.  He is only taking the Lasix and potassium a few days/week.  He should increase the frequency if he has more fluid retention.   Current medicines are reviewed at length with the patient today.  The patient concerns regarding his medicines were addressed.  The following changes have been made:  No change  Labs/ tests ordered today include:  No orders of the defined types were placed in this encounter.   Recommend 150 minutes/week of aerobic exercise Low fat, low carb, high fiber diet recommended  Disposition:   FU in 1 year and for echo   Signed, Larae Grooms, MD  05/23/2018 2:37 PM    [facility] [address], [place], [place]  [address] Phone: [phone]; Fax: [phone]

## 2018-05-30 ENCOUNTER — Ambulatory Visit (HOSPITAL_COMMUNITY): Payer: PPO | Attending: Internal Medicine

## 2018-05-30 DIAGNOSIS — I350 Nonrheumatic aortic (valve) stenosis: Secondary | ICD-10-CM

## 2018-06-04 ENCOUNTER — Telehealth: Payer: Self-pay | Admitting: Interventional Cardiology

## 2018-06-04 NOTE — Telephone Encounter (Signed)
Follow up  ° ° °Patient is returning your call. °

## 2018-06-04 NOTE — Telephone Encounter (Signed)
-----   Message from Jettie Booze, MD sent at 05/31/2018  6:52 PM EST ----- Aortic stenosis now severe.  He should watch for chest discomfort, signs of CHF ( DOE, fluid overload), or lightheadedness/syncope./  WOuld need left and right heart cath and then evaluation for SAVR vs. TAVR based on coronary anatomy.

## 2018-06-04 NOTE — Telephone Encounter (Signed)
Left message for patient to call back  

## 2018-06-04 NOTE — Telephone Encounter (Signed)
New message ° ° °Patient is returning call for echo results.  °

## 2018-06-04 NOTE — Telephone Encounter (Signed)
Made patient aware of results. Patient denies having any Sx at this time. Instructed patient to let us know if he develops any chest pain, DOE, lightheadedness, dizziness, or syncope. Per Dr. Irish Lack patient would need L&RHC and then eval for SAVR vs TAVR if he becomes symptomatic. Patient verbalized understanding and thanked me for the call. Recall placed for 6 months.

## 2018-07-05 DIAGNOSIS — K635 Polyp of colon: Secondary | ICD-10-CM | POA: Diagnosis not present

## 2018-07-05 DIAGNOSIS — E785 Hyperlipidemia, unspecified: Secondary | ICD-10-CM | POA: Diagnosis not present

## 2018-07-05 DIAGNOSIS — E7800 Pure hypercholesterolemia, unspecified: Secondary | ICD-10-CM | POA: Diagnosis not present

## 2018-07-05 DIAGNOSIS — M545 Low back pain: Secondary | ICD-10-CM | POA: Diagnosis not present

## 2018-07-05 DIAGNOSIS — Z6839 Body mass index (BMI) 39.0-39.9, adult: Secondary | ICD-10-CM | POA: Diagnosis not present

## 2018-07-05 DIAGNOSIS — R972 Elevated prostate specific antigen [PSA]: Secondary | ICD-10-CM | POA: Diagnosis not present

## 2018-07-05 DIAGNOSIS — D649 Anemia, unspecified: Secondary | ICD-10-CM | POA: Diagnosis not present

## 2018-07-05 DIAGNOSIS — G8918 Other acute postprocedural pain: Secondary | ICD-10-CM | POA: Diagnosis not present

## 2018-07-05 DIAGNOSIS — Z7901 Long term (current) use of anticoagulants: Secondary | ICD-10-CM | POA: Diagnosis not present

## 2018-07-05 DIAGNOSIS — Z8572 Personal history of non-Hodgkin lymphomas: Secondary | ICD-10-CM | POA: Diagnosis not present

## 2018-07-05 DIAGNOSIS — I4891 Unspecified atrial fibrillation: Secondary | ICD-10-CM | POA: Diagnosis not present

## 2018-07-05 DIAGNOSIS — H9190 Unspecified hearing loss, unspecified ear: Secondary | ICD-10-CM | POA: Diagnosis not present

## 2018-07-05 DIAGNOSIS — M9903 Segmental and somatic dysfunction of lumbar region: Secondary | ICD-10-CM | POA: Diagnosis not present

## 2018-07-05 DIAGNOSIS — S42211A Unspecified displaced fracture of surgical neck of right humerus, initial encounter for closed fracture: Secondary | ICD-10-CM | POA: Diagnosis not present

## 2018-07-05 DIAGNOSIS — R809 Proteinuria, unspecified: Secondary | ICD-10-CM | POA: Diagnosis not present

## 2018-07-05 DIAGNOSIS — M546 Pain in thoracic spine: Secondary | ICD-10-CM | POA: Diagnosis not present

## 2018-07-05 DIAGNOSIS — I10 Essential (primary) hypertension: Secondary | ICD-10-CM | POA: Diagnosis not present

## 2018-07-05 DIAGNOSIS — M9902 Segmental and somatic dysfunction of thoracic region: Secondary | ICD-10-CM | POA: Diagnosis not present

## 2018-07-05 DIAGNOSIS — E1140 Type 2 diabetes mellitus with diabetic neuropathy, unspecified: Secondary | ICD-10-CM | POA: Diagnosis not present

## 2018-08-01 DIAGNOSIS — R3121 Asymptomatic microscopic hematuria: Secondary | ICD-10-CM | POA: Diagnosis not present

## 2018-08-01 DIAGNOSIS — R972 Elevated prostate specific antigen [PSA]: Secondary | ICD-10-CM | POA: Diagnosis not present

## 2018-08-01 DIAGNOSIS — R351 Nocturia: Secondary | ICD-10-CM | POA: Diagnosis not present

## 2018-08-14 DIAGNOSIS — I4891 Unspecified atrial fibrillation: Secondary | ICD-10-CM | POA: Diagnosis not present

## 2018-08-14 DIAGNOSIS — Z7901 Long term (current) use of anticoagulants: Secondary | ICD-10-CM | POA: Diagnosis not present

## 2018-08-20 DIAGNOSIS — R3121 Asymptomatic microscopic hematuria: Secondary | ICD-10-CM | POA: Diagnosis not present

## 2018-08-20 DIAGNOSIS — N200 Calculus of kidney: Secondary | ICD-10-CM | POA: Diagnosis not present

## 2018-08-20 DIAGNOSIS — N400 Enlarged prostate without lower urinary tract symptoms: Secondary | ICD-10-CM | POA: Diagnosis not present

## 2018-09-03 DIAGNOSIS — R972 Elevated prostate specific antigen [PSA]: Secondary | ICD-10-CM | POA: Diagnosis not present

## 2018-09-10 DIAGNOSIS — R972 Elevated prostate specific antigen [PSA]: Secondary | ICD-10-CM | POA: Diagnosis not present

## 2018-09-10 DIAGNOSIS — R351 Nocturia: Secondary | ICD-10-CM | POA: Diagnosis not present

## 2018-09-10 DIAGNOSIS — R350 Frequency of micturition: Secondary | ICD-10-CM | POA: Diagnosis not present

## 2018-09-10 DIAGNOSIS — N403 Nodular prostate with lower urinary tract symptoms: Secondary | ICD-10-CM | POA: Diagnosis not present

## 2018-09-10 DIAGNOSIS — R3121 Asymptomatic microscopic hematuria: Secondary | ICD-10-CM | POA: Diagnosis not present

## 2018-09-18 DIAGNOSIS — R350 Frequency of micturition: Secondary | ICD-10-CM | POA: Diagnosis not present

## 2018-09-18 DIAGNOSIS — R351 Nocturia: Secondary | ICD-10-CM | POA: Diagnosis not present

## 2018-09-27 DIAGNOSIS — I4891 Unspecified atrial fibrillation: Secondary | ICD-10-CM | POA: Diagnosis not present

## 2018-09-27 DIAGNOSIS — Z7901 Long term (current) use of anticoagulants: Secondary | ICD-10-CM | POA: Diagnosis not present

## 2018-11-11 DIAGNOSIS — M5137 Other intervertebral disc degeneration, lumbosacral region: Secondary | ICD-10-CM | POA: Diagnosis not present

## 2018-11-11 DIAGNOSIS — M5136 Other intervertebral disc degeneration, lumbar region: Secondary | ICD-10-CM | POA: Diagnosis not present

## 2018-11-11 DIAGNOSIS — M9902 Segmental and somatic dysfunction of thoracic region: Secondary | ICD-10-CM | POA: Diagnosis not present

## 2018-11-11 DIAGNOSIS — M5134 Other intervertebral disc degeneration, thoracic region: Secondary | ICD-10-CM | POA: Diagnosis not present

## 2018-11-11 DIAGNOSIS — M9903 Segmental and somatic dysfunction of lumbar region: Secondary | ICD-10-CM | POA: Diagnosis not present

## 2018-11-11 DIAGNOSIS — M9904 Segmental and somatic dysfunction of sacral region: Secondary | ICD-10-CM | POA: Diagnosis not present

## 2018-12-20 DIAGNOSIS — Z7901 Long term (current) use of anticoagulants: Secondary | ICD-10-CM | POA: Diagnosis not present

## 2018-12-20 DIAGNOSIS — I4891 Unspecified atrial fibrillation: Secondary | ICD-10-CM | POA: Diagnosis not present

## 2019-01-02 DIAGNOSIS — H353131 Nonexudative age-related macular degeneration, bilateral, early dry stage: Secondary | ICD-10-CM | POA: Diagnosis not present

## 2019-01-14 DIAGNOSIS — E1140 Type 2 diabetes mellitus with diabetic neuropathy, unspecified: Secondary | ICD-10-CM | POA: Diagnosis not present

## 2019-01-14 DIAGNOSIS — E7800 Pure hypercholesterolemia, unspecified: Secondary | ICD-10-CM | POA: Diagnosis not present

## 2019-01-14 DIAGNOSIS — I10 Essential (primary) hypertension: Secondary | ICD-10-CM | POA: Diagnosis not present

## 2019-01-14 DIAGNOSIS — I350 Nonrheumatic aortic (valve) stenosis: Secondary | ICD-10-CM | POA: Diagnosis not present

## 2019-01-14 DIAGNOSIS — I4891 Unspecified atrial fibrillation: Secondary | ICD-10-CM | POA: Diagnosis not present

## 2019-01-27 ENCOUNTER — Encounter: Payer: Self-pay | Admitting: Interventional Cardiology

## 2019-01-27 NOTE — Progress Notes (Signed)
Virtual Visit via Telephone Note   This visit type was conducted due to national recommendations for restrictions regarding the COVID-19 Pandemic (e.g. social distancing) in an effort to limit this patient's exposure and mitigate transmission in our community.  Due to his co-morbid illnesses, this patient is at least at moderate risk for complications without adequate follow up.  This format is felt to be most appropriate for this patient at this time.  The patient did not have access to video technology/had technical difficulties with video requiring transitioning to audio format only (telephone).  All issues noted in this document were discussed and addressed.  No physical exam could be performed with this format.  Please refer to the patient's chart for his  consent to telehealth for [facility].   Date:  01/28/2019   ID:  Gregory [name], DOB [birth year], MRN [id number]  Patient Location: Home Provider Location: Home  PCP:  Kathyrn Lass, MD  Cardiologist:  No primary care provider on file. [place] Electrophysiologist:  None   Evaluation Performed:  Follow-Up Visit  Chief Complaint:  Aortic stenosis  History of Present Illness:    Gregory [name] is a 78 y.o. male with mitral regurgtation and severe AS.   In 07-02-15, had acute heart failure diastolic,asa diuresed. He is now 40 lbs lighter than he was in 2/17. 20 lbs of fluid diuresed and then 20 lb weight loss.He reduced salt in his diet.  Walking limited by knee pain.  Has received knee injections.   His sister in law died from cancer in early 07-02-2018.  THis has been a source of stress.   Since the last visit, he has been wearing a mask when he goes out and tries to avoid crowds.  Knee pain limits his walking.    Denies : Chest pain. Dizziness. Leg edema. Nitroglycerin use. Orthopnea. Palpitations. Paroxysmal nocturnal dyspnea. Shortness of breath. Syncope.   He has had some sinus issues.  He is doing yardwork  without any symptoms.   Feb echo showed severe AS: Aortic stenosis now severe. He should watch for chest discomfort, signs of CHF ( DOE, fluid overload), or lightheadedness/syncope./ WOuld need left and right heart cath and then evaluation for SAVR vs. TAVR based on coronary anatomy.  The patient does not have symptoms concerning for COVID-19 infection (fever, chills, cough, or new shortness of breath).    Past Medical History:  Diagnosis Date  . 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 Cancer ([place])    NHL  . Diabetes mellitus   . History of kidney stones   . Hypertension   . Other malignant lymphomas, unspecified site, extranodal and solid organ sites    NHL  . Ruptured disk   . Ulcer    Past Surgical History:  Procedure Laterality Date  . APPENDECTOMY  1967     Current Meds  Medication Sig  . atorvastatin (LIPITOR) 20 MG tablet Take 20 mg by mouth daily at 6 PM.   . diltiazem (CARDIZEM CD) 120 MG 24 hr capsule Take 120 mg by mouth daily at 6 PM.  . furosemide (LASIX) 40 MG tablet Take 1 tablet (40 mg total) by mouth daily.  Marland Kitchen glimepiride (AMARYL) 4 MG tablet Take 4 mg by mouth daily with breakfast.   . lisinopril (PRINIVIL,ZESTRIL) 10 MG tablet Take 10 mg by mouth daily.  . metFORMIN (GLUCOPHAGE-XR) 500 MG 24 hr tablet Take 500 mg by mouth 2 (two)  times daily.   . metoprolol succinate (TOPROL-XL) 100 MG 24 hr tablet Take 100 mg by mouth daily. Take with or immediately following a meal.  . Multiple Vitamins-Minerals (PRESERVISION AREDS 2 PO) Take 1 tablet by mouth daily.  . ONE TOUCH ULTRA TEST test strip 1 each by Other route as needed (BLOOD SUGAR).   Glory Rosebush DELICA LANCETS 99991111 MISC Apply 1 each topically daily as needed (BLOOD SUGAR).   Marland Kitchen pioglitazone (ACTOS) 45 MG tablet Take 45 mg by mouth daily.  . potassium chloride SA (K-DUR,KLOR-CON) 20 MEQ tablet TAKE 1 TABLET BY MOUTH ONCE DAILY  . warfarin (COUMADIN) 6 MG tablet Take 6 mg by mouth daily  at 6 PM. 3 mg on Thursday, 6 mg all other days     Allergies:   Bisoprolol-hydrochlorothiazide and Nsaids   Social History   Tobacco Use  . Smoking status: Never Smoker  . Smokeless tobacco: Never Used  Substance Use Topics  . Alcohol use: No  . Drug use: No     Family Hx: The patient's family history includes Cancer in his mother; Heart attack in his father and mother; Hypertension in his father. There is no history of Stroke.  ROS:   Please see the history of present illness.    Knee pain All other systems reviewed and are negative.   Prior CV studies:   The following studies were reviewed today:  05/2018 echo reviewed  Labs/Other Tests and Data Reviewed:    EKG:  An ECG dated 05/2018 was personally reviewed today and demonstrated:  AFib, RBBB, rate controlled  Recent Labs: No results found for requested labs within last 8760 hours.   Recent Lipid Panel No results found for: CHOL, TRIG, HDL, CHOLHDL, LDLCALC, LDLDIRECT  Wt Readings from Last 3 Encounters:  01/28/19 267 lb (121.1 kg)  05/23/18 269 lb 9.6 oz (122.3 kg)  04/10/17 273 lb (123.8 kg)     Objective:    Vital Signs:  Wt 267 lb (121.1 kg)   BMI 37.77 kg/m    VITAL SIGNS:  reviewed GEN:  no acute distress RESPIRATORY:  normal respiratory effort, symmetric expansion NEURO:  alert and oriented x 3, no obvious focal deficit PSYCH:  normal affect exam limited by phone format  ASSESSMENT & PLAN:    1. Aortic stenosis: mean gradient 45 mm Hg.  Will recheck echo in 4/21.  He would like to consider knee surgery with Dr. Noemi Chapel.  Will have to assess perioperative risks  Given the aortic stenosis. 2. AFib: Rate controlled.   No bleeding problems.  3. Mitral regurgitation: No CHF.   4. Hypertensive heart disease: Check BP at home when possible. 5. DM: A1C pending.  Drawn this AM.  6. Hyperlipidemia: lipids pending. 7. Chronic diastolic heart failure: Breathing is comfortable on the phone.   COVID-19  Education: The signs and symptoms of COVID-19 were discussed with the patient and how to seek care for testing (follow up with PCP or arrange E-visit).  The importance of social distancing was discussed today.  Time:   Today, I have spent 15 minutes with the patient with telehealth technology discussing the above problems.     Medication Adjustments/Labs and Tests Ordered: Current medicines are reviewed at length with the patient today.  Concerns regarding medicines are outlined above.   Tests Ordered: No orders of the defined types were placed in this encounter.   Medication Changes: No orders of the defined types were placed in this encounter.   Follow  Up:  In Person in 6 month(s)  Signed, Larae Grooms, MD  01/28/2019 2:34 PM    Gregory [name]

## 2019-01-28 ENCOUNTER — Other Ambulatory Visit: Payer: Self-pay

## 2019-01-28 ENCOUNTER — Telehealth (INDEPENDENT_AMBULATORY_CARE_PROVIDER_SITE_OTHER): Payer: PPO | Admitting: Interventional Cardiology

## 2019-01-28 ENCOUNTER — Encounter: Payer: Self-pay | Admitting: Interventional Cardiology

## 2019-01-28 ENCOUNTER — Telehealth: Payer: Self-pay | Admitting: Interventional Cardiology

## 2019-01-28 VITALS — Wt 267.0 lb

## 2019-01-28 DIAGNOSIS — E119 Type 2 diabetes mellitus without complications: Secondary | ICD-10-CM | POA: Diagnosis not present

## 2019-01-28 DIAGNOSIS — I5032 Chronic diastolic (congestive) heart failure: Secondary | ICD-10-CM | POA: Diagnosis not present

## 2019-01-28 DIAGNOSIS — I4819 Other persistent atrial fibrillation: Secondary | ICD-10-CM | POA: Diagnosis not present

## 2019-01-28 DIAGNOSIS — E7800 Pure hypercholesterolemia, unspecified: Secondary | ICD-10-CM | POA: Diagnosis not present

## 2019-01-28 DIAGNOSIS — I4891 Unspecified atrial fibrillation: Secondary | ICD-10-CM | POA: Diagnosis not present

## 2019-01-28 DIAGNOSIS — I350 Nonrheumatic aortic (valve) stenosis: Secondary | ICD-10-CM | POA: Diagnosis not present

## 2019-01-28 DIAGNOSIS — E1140 Type 2 diabetes mellitus with diabetic neuropathy, unspecified: Secondary | ICD-10-CM | POA: Diagnosis not present

## 2019-01-28 DIAGNOSIS — Z23 Encounter for immunization: Secondary | ICD-10-CM | POA: Diagnosis not present

## 2019-01-28 DIAGNOSIS — E782 Mixed hyperlipidemia: Secondary | ICD-10-CM

## 2019-01-28 DIAGNOSIS — I10 Essential (primary) hypertension: Secondary | ICD-10-CM | POA: Diagnosis not present

## 2019-01-28 NOTE — Telephone Encounter (Signed)
Arranging visit now

## 2019-01-28 NOTE — Telephone Encounter (Signed)
New Message     Pt is calling about his appt today, he said he missed a call    Please call back

## 2019-01-28 NOTE — Patient Instructions (Addendum)
Medication Instructions:  Your physician recommends that you continue on your current medications as directed. Please refer to the Current Medication list given to you today.  If you need a refill on your cardiac medications before your next appointment, please call your pharmacy.   Lab work: None Ordered  If you have labs (blood work) drawn today and your tests are completely normal, you will receive your results only by: Marland Kitchen MyChart Message (if you have MyChart) OR . A paper copy in the mail If you have any lab test that is abnormal or we need to change your treatment, we will call you to review the results.  Testing/Procedures: Your physician has requested that you have an echocardiogram in 5-6 months. Echocardiography is a painless test that uses sound waves to create images of your heart. It provides your doctor with information about the size and shape of your heart and how well your heart's chambers and valves are working. This procedure takes approximately one hour. There are no restrictions for this procedure.  Follow-Up: . Your physician wants you to follow-up in: 5-6 months with Dr. Irish Lack. You will receive a reminder letter in the mail two months in advance. If you don't receive a letter, please call our office to schedule the follow-up appointment.  Any Other Special Instructions Will Be Listed Below (If Applicable).

## 2019-03-21 DIAGNOSIS — I4891 Unspecified atrial fibrillation: Secondary | ICD-10-CM | POA: Diagnosis not present

## 2019-03-21 DIAGNOSIS — Z7901 Long term (current) use of anticoagulants: Secondary | ICD-10-CM | POA: Diagnosis not present

## 2019-04-11 DIAGNOSIS — I4891 Unspecified atrial fibrillation: Secondary | ICD-10-CM | POA: Diagnosis not present

## 2019-04-11 DIAGNOSIS — Z7901 Long term (current) use of anticoagulants: Secondary | ICD-10-CM | POA: Diagnosis not present

## 2019-05-06 DIAGNOSIS — H353132 Nonexudative age-related macular degeneration, bilateral, intermediate dry stage: Secondary | ICD-10-CM | POA: Diagnosis not present

## 2019-05-06 DIAGNOSIS — E119 Type 2 diabetes mellitus without complications: Secondary | ICD-10-CM | POA: Diagnosis not present

## 2019-06-06 DIAGNOSIS — I4891 Unspecified atrial fibrillation: Secondary | ICD-10-CM | POA: Diagnosis not present

## 2019-06-06 DIAGNOSIS — Z7901 Long term (current) use of anticoagulants: Secondary | ICD-10-CM | POA: Diagnosis not present

## 2019-06-20 DIAGNOSIS — I4891 Unspecified atrial fibrillation: Secondary | ICD-10-CM | POA: Diagnosis not present

## 2019-06-20 DIAGNOSIS — Z7901 Long term (current) use of anticoagulants: Secondary | ICD-10-CM | POA: Diagnosis not present

## 2019-07-07 DIAGNOSIS — Z7901 Long term (current) use of anticoagulants: Secondary | ICD-10-CM | POA: Diagnosis not present

## 2019-07-11 DIAGNOSIS — E7800 Pure hypercholesterolemia, unspecified: Secondary | ICD-10-CM | POA: Diagnosis not present

## 2019-07-11 DIAGNOSIS — I4891 Unspecified atrial fibrillation: Secondary | ICD-10-CM | POA: Diagnosis not present

## 2019-07-11 DIAGNOSIS — E1140 Type 2 diabetes mellitus with diabetic neuropathy, unspecified: Secondary | ICD-10-CM | POA: Diagnosis not present

## 2019-07-11 DIAGNOSIS — Z7984 Long term (current) use of oral hypoglycemic drugs: Secondary | ICD-10-CM | POA: Diagnosis not present

## 2019-07-11 DIAGNOSIS — M1711 Unilateral primary osteoarthritis, right knee: Secondary | ICD-10-CM | POA: Diagnosis not present

## 2019-07-11 DIAGNOSIS — I10 Essential (primary) hypertension: Secondary | ICD-10-CM | POA: Diagnosis not present

## 2019-07-15 ENCOUNTER — Ambulatory Visit (HOSPITAL_COMMUNITY): Payer: PPO | Attending: Internal Medicine

## 2019-07-15 ENCOUNTER — Other Ambulatory Visit: Payer: Self-pay

## 2019-07-15 DIAGNOSIS — I350 Nonrheumatic aortic (valve) stenosis: Secondary | ICD-10-CM | POA: Diagnosis not present

## 2019-07-17 ENCOUNTER — Telehealth: Payer: Self-pay | Admitting: Interventional Cardiology

## 2019-07-17 NOTE — Telephone Encounter (Signed)
Patient is returning phone call in regards to Echo results. Please advise.

## 2019-07-17 NOTE — Telephone Encounter (Signed)
Drue Novel I, RN  07/17/2019 8:56 AM EDT    The patient has been notified of the result and verbalized understanding. He states that he is not having any Sx. Denies chest pain, SOB, swelling, lightheadedness, dizziness, syncope, etc. He will let us know if he develops any Sx. All questions (if any) were answered. Cleon Gustin, RN 07/17/2019 8:54 AM    Cleon Gustin, RN  07/16/2019 3:57 PM EDT    Left message for patient to call back.   Jettie Booze, MD  07/16/2019 3:36 PM EDT    Normal LV function. Severe AS. Further treatment will depend on sx.

## 2019-07-28 ENCOUNTER — Other Ambulatory Visit: Payer: Self-pay

## 2019-07-28 ENCOUNTER — Ambulatory Visit: Payer: PPO | Admitting: Interventional Cardiology

## 2019-07-28 ENCOUNTER — Encounter: Payer: Self-pay | Admitting: Interventional Cardiology

## 2019-07-28 VITALS — BP 118/78 | HR 85 | Ht 70.5 in | Wt 268.0 lb

## 2019-07-28 DIAGNOSIS — I350 Nonrheumatic aortic (valve) stenosis: Secondary | ICD-10-CM

## 2019-07-28 DIAGNOSIS — I4819 Other persistent atrial fibrillation: Secondary | ICD-10-CM

## 2019-07-28 DIAGNOSIS — E782 Mixed hyperlipidemia: Secondary | ICD-10-CM

## 2019-07-28 DIAGNOSIS — I5032 Chronic diastolic (congestive) heart failure: Secondary | ICD-10-CM | POA: Diagnosis not present

## 2019-07-28 DIAGNOSIS — I4891 Unspecified atrial fibrillation: Secondary | ICD-10-CM | POA: Diagnosis not present

## 2019-07-28 DIAGNOSIS — Z7901 Long term (current) use of anticoagulants: Secondary | ICD-10-CM | POA: Diagnosis not present

## 2019-07-28 DIAGNOSIS — E119 Type 2 diabetes mellitus without complications: Secondary | ICD-10-CM | POA: Diagnosis not present

## 2019-07-28 NOTE — H&P (View-Only) (Signed)
Cardiology Office Note   Date:  07/28/2019   ID:  Gregory [name], DOB 05/27/1940, MRN [id number]  PCP:  Kathyrn Lass, MD    No chief complaint on file.  Aortic stenosis  Wt Readings from Last 3 Encounters:  07/28/19 268 lb (121.6 kg)  01/28/19 267 lb (121.1 kg)  05/23/18 269 lb 9.6 oz (122.3 kg)       History of Present Illness: Gregory [name] is a 79 y.o. male  with mitral regurgtation and severe AS.   In 07-05-15, had acute heart failure diastolic,asa diuresed. He is now 40 lbs lighter than he was in 2/17. 20 lbs of fluid diuresed and then 20 lb weight loss.He reduced salt in his diet.  Walking limited by knee pain. Has received knee injections.   His sister in law died from cancer in early 2018-07-05. THis has been a source of stress.   05/2018 echo showed severe AS: Aortic stenosis now severe. He should watch for chest discomfort, signs of CHF ( DOE, fluid overload), or lightheadedness/syncope./ WOuld need left and right heart cath and then evaluation for SAVR vs. TAVR based on coronary anatomy.  The patient does not have symptoms concerning for COVID-19 infection (fever, chills, cough, or new shortness of breath).   4/21 echo showed: Normal LV function. Calcification of the aortomitral continuity with severe aortic valve  stenosis and moderate mitral valve stesnosis. Findings suggestive of  radiation heart disease.  6. The mitral valve is abnormal. Mild to moderate mitral valve  regurgitation. Moderate mitral stenosis. The mean mitral valve gradient is 6.0 mmHg with average heart rate of 74 bpm.  7. The aortic valve is abnormal. Aortic valve regurgitation is not  visualized. Severe aortic valve stenosis. Aortic valve mean gradient  measures 44.0 mmHg.  8. Aortic dilatation noted. There is mild dilatation of the ascending  aorta measuring 41 mm.   Since the last visit, he has tried to be active.  His ability to do yardwork has dropped off.    Denies :  Chest pain. Dizziness. Leg edema. Nitroglycerin use. Orthopnea. Palpitations. Paroxysmal nocturnal dyspnea.Syncope.   Reports DOE.  Also limited by knee pain.      Past Medical History:  Diagnosis Date  . 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 Cancer ([place])    NHL  . Diabetes mellitus   . History of kidney stones   . Hypertension   . Other malignant lymphomas, unspecified site, extranodal and solid organ sites    NHL  . Ruptured disk   . Ulcer     Past Surgical History:  Procedure Laterality Date  . APPENDECTOMY  1967     Current Outpatient Medications  Medication Sig Dispense Refill  . atorvastatin (LIPITOR) 20 MG tablet Take 20 mg by mouth daily at 6 PM.     . diltiazem (CARDIZEM CD) 120 MG 24 hr capsule Take 120 mg by mouth daily at 6 PM.    . furosemide (LASIX) 40 MG tablet Take 1 tablet (40 mg total) by mouth daily. 90 tablet 3  . glimepiride (AMARYL) 4 MG tablet Take 4 mg by mouth daily with breakfast.     . lisinopril (PRINIVIL,ZESTRIL) 10 MG tablet Take 10 mg by mouth daily.    . metFORMIN (GLUCOPHAGE-XR) 500 MG 24 hr tablet Take 500 mg by mouth 2 (two) times daily.     . metoprolol succinate (TOPROL-XL) 100 MG 24 hr tablet  Take 100 mg by mouth daily. Take with or immediately following a meal.    . Multiple Vitamins-Minerals (PRESERVISION AREDS 2 PO) Take 1 tablet by mouth daily.    . ONE TOUCH ULTRA TEST test strip 1 each by Other route as needed (BLOOD SUGAR).     Glory Rosebush DELICA LANCETS 99991111 MISC Apply 1 each topically daily as needed (BLOOD SUGAR).     Marland Kitchen pioglitazone (ACTOS) 45 MG tablet Take 45 mg by mouth daily.    . potassium chloride SA (K-DUR,KLOR-CON) 20 MEQ tablet TAKE 1 TABLET BY MOUTH ONCE DAILY 90 tablet 3  . warfarin (COUMADIN) 6 MG tablet Take 6 mg by mouth daily at 6 PM. 3 mg on Thursday, 6 mg all other days     No current facility-administered medications for this visit.    Allergies:   Bisoprolol-hydrochlorothiazide and  Nsaids    Social History:  The patient  reports that he has never smoked. He has never used smokeless tobacco. He reports that he does not drink alcohol or use drugs.   Family History:  The patient's family history includes Cancer in his mother; Heart attack in his father and mother; Hypertension in his father.    ROS:  Please see the history of present illness.   Otherwise, review of systems are positive for knee pain.   All other systems are reviewed and negative.    PHYSICAL EXAM: VS:  BP 118/78   Pulse 85   Ht 5' 10.5" (1.791 m)   Wt 268 lb (121.6 kg)   SpO2 96%   BMI 37.91 kg/m  , BMI Body mass index is 37.91 kg/m. GEN: Well nourished, well developed, in no acute distress  HEENT: poor dentition Neck: no JVD, carotid bruits, or masses Cardiac: RRR; 3/6 systolic murmur, no rubs, or gallops,no edema  Respiratory:  clear to auscultation bilaterally, normal work of breathing GI: soft, nontender, nondistended, + BS MS: no deformity or atrophy  Skin: warm and dry, no rash Neuro:  Strength and sensation are intact Psych: euthymic mood, full affect   EKG:   The ekg ordered today demonstrates AFib, RBBB, rate controlled   Recent Labs: No results found for requested labs within last 8760 hours.   Lipid Panel No results found for: CHOL, TRIG, HDL, CHOLHDL, VLDL, LDLCALC, LDLDIRECT   Other studies Reviewed: Additional studies/ records that were reviewed today with results demonstrating: LDL 53 in 01/2019 per PMD.   ASSESSMENT AND PLAN:  1. Aortic stenosis/mitral stenosis: Concern for radiation heart disease based on most recent echo.  Aortic stenosis is severe.  Mitral stenosis was moderate.  Given the progression of his mitral valve disease, may need to consider open heart option for valve replacements when the time is right.  Plan for L/RHC to further eval.  2. Atrial fibrillation: We have pursued a strategy of rate control and anticoagulation. 3. Hypertensive heart  disease: The current medical regimen is effective;  continue present plan and medications. 4. Mitral regurgitation: No CHF. 5. Diabetes: A1C 7.0 in 01/2019. 6. Hyperlipidemia: LDL 53 in 01/2019. 7. Chronic diastolic heart failure: No significant volume overload.  8. Will need teeth pulled. 9. I recommended the COVID 19 vaccine, but he is not interested.   Cardiac catheterization was discussed with the patient fully. The patient understands that risks include but are not limited to stroke (1 in 1000), death (1 in 79), kidney failure [usually temporary] (1 in 500), bleeding (1 in 200), allergic reaction [possibly serious] (1  in 200).  The patient understands and is willing to proceed.      Current medicines are reviewed at length with the patient today.  The patient concerns regarding his medicines were addressed.  The following changes have been made:  No change  Labs/ tests ordered today include: BMet CBC No orders of the defined types were placed in this encounter.   Recommend 150 minutes/week of aerobic exercise Low fat, low carb, high fiber diet recommended  Disposition:   FU in for cath   Signed, Larae Grooms, MD  07/28/2019 2:16 PM    [facility] [place], [place], [place]  [address] Phone: [phone]; Fax: [phone]

## 2019-07-28 NOTE — Progress Notes (Signed)
Cardiology Office Note   Date:  07/28/2019   ID:  Gregory [name], DOB 26-May-1940, MRN [id number]  PCP:  Kathyrn Lass, MD    No chief complaint on file.  Aortic stenosis  Wt Readings from Last 3 Encounters:  07/28/19 268 lb (121.6 kg)  01/28/19 267 lb (121.1 kg)  05/23/18 269 lb 9.6 oz (122.3 kg)       History of Present Illness: Gregory [name] is a 79 y.o. male  with mitral regurgtation and severe AS.   In Jul 07, 2015, had acute heart failure diastolic,asa diuresed. He is now 40 lbs lighter than he was in 2/17. 20 lbs of fluid diuresed and then 20 lb weight loss.He reduced salt in his diet.  Walking limited by knee pain. Has received knee injections.   His sister in law died from cancer in early 07-07-18. THis has been a source of stress.   05/2018 echo showed severe AS: Aortic stenosis now severe. He should watch for chest discomfort, signs of CHF ( DOE, fluid overload), or lightheadedness/syncope./ WOuld need left and right heart cath and then evaluation for SAVR vs. TAVR based on coronary anatomy.  The patient does not have symptoms concerning for COVID-19 infection (fever, chills, cough, or new shortness of breath).   4/21 echo showed: Normal LV function. Calcification of the aortomitral continuity with severe aortic valve  stenosis and moderate mitral valve stesnosis. Findings suggestive of  radiation heart disease.  6. The mitral valve is abnormal. Mild to moderate mitral valve  regurgitation. Moderate mitral stenosis. The mean mitral valve gradient is 6.0 mmHg with average heart rate of 74 bpm.  7. The aortic valve is abnormal. Aortic valve regurgitation is not  visualized. Severe aortic valve stenosis. Aortic valve mean gradient  measures 44.0 mmHg.  8. Aortic dilatation noted. There is mild dilatation of the ascending  aorta measuring 41 mm.   Since the last visit, he has tried to be active.  His ability to do yardwork has dropped off.    Denies :  Chest pain. Dizziness. Leg edema. Nitroglycerin use. Orthopnea. Palpitations. Paroxysmal nocturnal dyspnea.Syncope.   Reports DOE.  Also limited by knee pain.      Past Medical History:  Diagnosis Date  . 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 Cancer ([place])    NHL  . Diabetes mellitus   . History of kidney stones   . Hypertension   . Other malignant lymphomas, unspecified site, extranodal and solid organ sites    NHL  . Ruptured disk   . Ulcer     Past Surgical History:  Procedure Laterality Date  . APPENDECTOMY  1967     Current Outpatient Medications  Medication Sig Dispense Refill  . atorvastatin (LIPITOR) 20 MG tablet Take 20 mg by mouth daily at 6 PM.     . diltiazem (CARDIZEM CD) 120 MG 24 hr capsule Take 120 mg by mouth daily at 6 PM.    . furosemide (LASIX) 40 MG tablet Take 1 tablet (40 mg total) by mouth daily. 90 tablet 3  . glimepiride (AMARYL) 4 MG tablet Take 4 mg by mouth daily with breakfast.     . lisinopril (PRINIVIL,ZESTRIL) 10 MG tablet Take 10 mg by mouth daily.    . metFORMIN (GLUCOPHAGE-XR) 500 MG 24 hr tablet Take 500 mg by mouth 2 (two) times daily.     . metoprolol succinate (TOPROL-XL) 100 MG 24 hr tablet  Take 100 mg by mouth daily. Take with or immediately following a meal.    . Multiple Vitamins-Minerals (PRESERVISION AREDS 2 PO) Take 1 tablet by mouth daily.    . ONE TOUCH ULTRA TEST test strip 1 each by Other route as needed (BLOOD SUGAR).     Glory Rosebush DELICA LANCETS 99991111 MISC Apply 1 each topically daily as needed (BLOOD SUGAR).     Marland Kitchen pioglitazone (ACTOS) 45 MG tablet Take 45 mg by mouth daily.    . potassium chloride SA (K-DUR,KLOR-CON) 20 MEQ tablet TAKE 1 TABLET BY MOUTH ONCE DAILY 90 tablet 3  . warfarin (COUMADIN) 6 MG tablet Take 6 mg by mouth daily at 6 PM. 3 mg on Thursday, 6 mg all other days     No current facility-administered medications for this visit.    Allergies:   Bisoprolol-hydrochlorothiazide and  Nsaids    Social History:  The patient  reports that he has never smoked. He has never used smokeless tobacco. He reports that he does not drink alcohol or use drugs.   Family History:  The patient's family history includes Cancer in his mother; Heart attack in his father and mother; Hypertension in his father.    ROS:  Please see the history of present illness.   Otherwise, review of systems are positive for knee pain.   All other systems are reviewed and negative.    PHYSICAL EXAM: VS:  BP 118/78   Pulse 85   Ht 5' 10.5" (1.791 m)   Wt 268 lb (121.6 kg)   SpO2 96%   BMI 37.91 kg/m  , BMI Body mass index is 37.91 kg/m. GEN: Well nourished, well developed, in no acute distress  HEENT: poor dentition Neck: no JVD, carotid bruits, or masses Cardiac: RRR; 3/6 systolic murmur, no rubs, or gallops,no edema  Respiratory:  clear to auscultation bilaterally, normal work of breathing GI: soft, nontender, nondistended, + BS MS: no deformity or atrophy  Skin: warm and dry, no rash Neuro:  Strength and sensation are intact Psych: euthymic mood, full affect   EKG:   The ekg ordered today demonstrates AFib, RBBB, rate controlled   Recent Labs: No results found for requested labs within last 8760 hours.   Lipid Panel No results found for: CHOL, TRIG, HDL, CHOLHDL, VLDL, LDLCALC, LDLDIRECT   Other studies Reviewed: Additional studies/ records that were reviewed today with results demonstrating: LDL 53 in 01/2019 per PMD.   ASSESSMENT AND PLAN:  1. Aortic stenosis/mitral stenosis: Concern for radiation heart disease based on most recent echo.  Aortic stenosis is severe.  Mitral stenosis was moderate.  Given the progression of his mitral valve disease, may need to consider open heart option for valve replacements when the time is right.  Plan for L/RHC to further eval.  2. Atrial fibrillation: We have pursued a strategy of rate control and anticoagulation. 3. Hypertensive heart  disease: The current medical regimen is effective;  continue present plan and medications. 4. Mitral regurgitation: No CHF. 5. Diabetes: A1C 7.0 in 01/2019. 6. Hyperlipidemia: LDL 53 in 01/2019. 7. Chronic diastolic heart failure: No significant volume overload.  8. Will need teeth pulled. 9. I recommended the COVID 19 vaccine, but he is not interested.   Cardiac catheterization was discussed with the patient fully. The patient understands that risks include but are not limited to stroke (1 in 1000), death (1 in 44), kidney failure [usually temporary] (1 in 500), bleeding (1 in 200), allergic reaction [possibly serious] (1  in 200).  The patient understands and is willing to proceed.      Current medicines are reviewed at length with the patient today.  The patient concerns regarding his medicines were addressed.  The following changes have been made:  No change  Labs/ tests ordered today include: BMet CBC No orders of the defined types were placed in this encounter.   Recommend 150 minutes/week of aerobic exercise Low fat, low carb, high fiber diet recommended  Disposition:   FU in for cath   Signed, Larae Grooms, MD  07/28/2019 2:16 PM    [facility] [place], [place], [place]  [address] Phone: [phone]; Fax: [phone]

## 2019-07-28 NOTE — Patient Instructions (Signed)
Medication Instructions:  Your physician recommends that you continue on your current medications as directed. Please refer to the Current Medication list given to you today.  *If you need a refill on your cardiac medications before your next appointment, please call your pharmacy*   Lab Work: TODAY: CBC, BMET, INR  If you have labs (blood work) drawn today and your tests are completely normal, you will receive your results only by: Marland Kitchen MyChart Message (if you have MyChart) OR . A paper copy in the mail If you have any lab test that is abnormal or we need to change your treatment, we will call you to review the results.   Testing/Procedures: Your physician has requested that you have a cardiac catheterization. Cardiac catheterization is used to diagnose and/or treat various heart conditions. Doctors may recommend this procedure for a number of different reasons. The most common reason is to evaluate chest pain. Chest pain can be a symptom of coronary artery disease (CAD), and cardiac catheterization can show whether plaque is narrowing or blocking your heart's arteries. This procedure is also used to evaluate the valves, as well as measure the blood flow and oxygen levels in different parts of your heart. For further information please visit [email or web address]. Please follow instruction sheet, as given.   Follow-Up: Follow up with Dr. Irish Lack on 08/26/19 at 3:00 PM  Other [place] OFFICE [place], [place] [address] Dept: [phone] Loc: [place]  07/28/2019  You are scheduled for a Cardiac Catheterization on Tuesday, May 11 with Dr. Larae Grooms.  1. Please arrive at the [facility] (Main Entrance A) at [facility]: [address] [place], [place] [address] at 7:00 AM (This time is two hours before your procedure to ensure your  preparation). Free valet parking service is available.   Special note: Every effort is made to have your procedure done on time. Please understand that emergencies sometimes delay scheduled procedures.  2. Diet: Do not eat solid foods after midnight.  The patient may have clear liquids until 5am upon the day of the procedure.  3. Labs: TODAY: CBC, BMET, INR  Your Pre-procedure COVID-19 Testing will be done on 08/09/19 at 11:25 AM at [facility] at [id number], [place], [place] [address]. Once you arrive at the testing site, stay in the right hand lane, go under the building overhang not the tent. If you are tested under the tent your results may not be back before your procedure. Please be on time for your appointment.  After your swab you will be given a mask to wear and instructed to go home and quarantine/no visitors until after your procedure. If you test positive you will be notified and your procedure will be cancelled.    4. Medication instructions in preparation for your procedure:   Contrast Allergy: No  DO NOT take glimepiride the morning of the procedure  DO NOT take furosemide (lasix) the morning of the procedure  DO NOT take Actos the morning of the procedure  DO NOT take metformin the morning of the procedure and for 48 hours after the procedure  DOT not take coumadin for 5 days prior to procedure  On the morning of your procedure, take a baby Aspirin 81 mg and any morning medicines NOT listed above.  You may use sips of water.  5. Plan for  one night stay--bring personal belongings. 6. Bring a current list of your medications and current insurance cards. 7. You MUST have a responsible person to drive you home. 8. Someone MUST be with you the first 24 hours after you arrive home or your discharge will be delayed. 9. Please wear clothes that are easy to get on and off and wear slip-on shoes.  Thank you for allowing [place] to care for you!    -- [facility]

## 2019-07-29 LAB — BASIC METABOLIC PANEL
BUN/Creatinine Ratio: 18 (ref 10–24)
BUN: 20 mg/dL (ref 8–27)
CO2: 22 mmol/L (ref 20–29)
Calcium: 9 mg/dL (ref 8.6–10.2)
Chloride: 102 mmol/L (ref 96–106)
Creatinine, Ser: 1.14 mg/dL (ref 0.76–1.27)
GFR calc Af Amer: 70 mL/min/{1.73_m2} (ref >59)
GFR calc non Af Amer: 61 mL/min/{1.73_m2} (ref >59)
Glucose: 174 mg/dL — ABNORMAL HIGH (ref 65–99)
Potassium: 5 mmol/L (ref 3.5–5.2)
Sodium: 140 mmol/L (ref 134–144)

## 2019-07-29 LAB — CBC
Hematocrit: 40.9 % (ref 37.5–51.0)
Hemoglobin: 13.4 g/dL (ref 13.0–17.7)
MCH: 29.6 pg (ref 26.6–33.0)
MCHC: 32.8 g/dL (ref 31.5–35.7)
MCV: 90 fL (ref 79–97)
Platelets: 203 10*3/uL (ref 150–450)
RBC: 4.53 x10E6/uL (ref 4.14–5.80)
RDW: 14.2 % (ref 11.6–15.4)
WBC: 10.5 10*3/uL (ref 3.4–10.8)

## 2019-07-29 LAB — PROTIME-INR
INR: 2.3 — ABNORMAL HIGH (ref 0.9–1.2)
Prothrombin Time: 24 s — ABNORMAL HIGH (ref 9.1–12.0)

## 2019-08-06 DIAGNOSIS — Z7984 Long term (current) use of oral hypoglycemic drugs: Secondary | ICD-10-CM | POA: Diagnosis not present

## 2019-08-06 DIAGNOSIS — I4891 Unspecified atrial fibrillation: Secondary | ICD-10-CM | POA: Diagnosis not present

## 2019-08-06 DIAGNOSIS — Z6838 Body mass index (BMI) 38.0-38.9, adult: Secondary | ICD-10-CM | POA: Diagnosis not present

## 2019-08-06 DIAGNOSIS — Z7901 Long term (current) use of anticoagulants: Secondary | ICD-10-CM | POA: Diagnosis not present

## 2019-08-06 DIAGNOSIS — I350 Nonrheumatic aortic (valve) stenosis: Secondary | ICD-10-CM | POA: Diagnosis not present

## 2019-08-06 DIAGNOSIS — S5010XA Contusion of unspecified forearm, initial encounter: Secondary | ICD-10-CM | POA: Diagnosis not present

## 2019-08-06 DIAGNOSIS — W19XXXA Unspecified fall, initial encounter: Secondary | ICD-10-CM | POA: Diagnosis not present

## 2019-08-06 DIAGNOSIS — I10 Essential (primary) hypertension: Secondary | ICD-10-CM | POA: Diagnosis not present

## 2019-08-06 DIAGNOSIS — E1140 Type 2 diabetes mellitus with diabetic neuropathy, unspecified: Secondary | ICD-10-CM | POA: Diagnosis not present

## 2019-08-06 DIAGNOSIS — R945 Abnormal results of liver function studies: Secondary | ICD-10-CM | POA: Diagnosis not present

## 2019-08-06 DIAGNOSIS — R300 Dysuria: Secondary | ICD-10-CM | POA: Diagnosis not present

## 2019-08-09 ENCOUNTER — Other Ambulatory Visit (HOSPITAL_COMMUNITY)
Admission: RE | Admit: 2019-08-09 | Discharge: 2019-08-09 | Disposition: A | Discharge status: Home / Self Care | Payer: PPO | Source: Ambulatory Visit | Attending: Interventional Cardiology | Admitting: Interventional Cardiology

## 2019-08-09 DIAGNOSIS — Z01812 Encounter for preprocedural laboratory examination: Secondary | ICD-10-CM | POA: Diagnosis not present

## 2019-08-09 DIAGNOSIS — Z20822 Contact with and (suspected) exposure to covid-19: Secondary | ICD-10-CM | POA: Insufficient documentation

## 2019-08-09 LAB — SARS CORONAVIRUS 2 (TAT 6-24 HRS): SARS Coronavirus 2: NEGATIVE

## 2019-08-11 ENCOUNTER — Telehealth: Payer: Self-pay | Admitting: *Deleted

## 2019-08-11 ENCOUNTER — Telehealth: Payer: Self-pay | Admitting: Interventional Cardiology

## 2019-08-11 NOTE — Telephone Encounter (Signed)
Pt contacted pre-catheterization scheduled at [facility] for: Tuesday Aug 12, 2019 9 AM Verified arrival time and place: [facility]) at: 7 AM   No solid food after midnight prior to cath, clear liquids until 5 AM day of procedure.  Hold: Metformin-day of procedure and 48 hours post procedure. Actos-AM of procedure. Glimepiride-AM of procedure Lasix/KCl-AM of procedure-pt states not taking regularly. Coumadin-none 08/07/19 until post procedure  Except hold medications AM meds can be  taken pre-cath with sip of water including: ASA 81 mg   Confirmed patient has responsible adult to drive home post procedure and observe 24 hours after arriving home: yes  You are allowed ONE visitor in the waiting room during your procedures. Both you and your visitor must wear masks.      COVID-19 Pre-Screening Questions:  . In the past 7 to 10 days have you had a cough,  shortness of breath, headache, congestion, fever (100 or greater) body aches, chills, sore throat, or sudden loss of taste or sense of smell? no . Have you been around anyone with known Covid 19 in the past 7 to 10 days? . Have you been around anyone who is awaiting Covid 19 test results in the past 7 to 10 days? . Have you been around anyone who has mentioned symptoms of Covid 19 within the past 7 to 10 days? no Reviewed procedure/mask/visitor instructions, COVID-19 screening questions with patient.  Pt states 08/09/19 he was given Keflex for UTI, symptoms painful urination. Pt states UTI symptoms have resolved, no fever, pt advised to continue Keflex as prescribed, okay to take in the morning before he goes to hospital.

## 2019-08-11 NOTE — Telephone Encounter (Signed)
  Pt c/o medication issue:  1. Name of Medication: cephalexin 500mg   2. How are you currently taking this medication (dosage and times per day)? 1 tablet 3 times a day for 7 days. Started Saturday.  3. Are you having a reaction (difficulty breathing--STAT)? no  4. What is your medication issue? Patient would like to know if he should hold the medication prior to his procedure tomorrow. He states it is an antibiotic for his urine inflection and he started it Saturday 08/09/2019.

## 2019-08-11 NOTE — Telephone Encounter (Signed)
See other phone note 08/11/19.

## 2019-08-12 ENCOUNTER — Telehealth: Payer: Self-pay | Admitting: Interventional Cardiology

## 2019-08-12 ENCOUNTER — Ambulatory Visit (HOSPITAL_COMMUNITY)
Admission: RE | Admit: 2019-08-12 | Discharge: 2019-08-12 | Disposition: A | Discharge status: Home / Self Care | Payer: PPO | Attending: Interventional Cardiology | Admitting: Interventional Cardiology

## 2019-08-12 ENCOUNTER — Encounter (HOSPITAL_COMMUNITY)
Admission: RE | Disposition: A | Discharge status: Home / Self Care | Payer: PPO | Source: Home / Self Care | Attending: Interventional Cardiology

## 2019-08-12 ENCOUNTER — Other Ambulatory Visit: Payer: Self-pay

## 2019-08-12 DIAGNOSIS — I080 Rheumatic disorders of both mitral and aortic valves: Secondary | ICD-10-CM | POA: Insufficient documentation

## 2019-08-12 DIAGNOSIS — Z886 Allergy status to analgesic agent status: Secondary | ICD-10-CM | POA: Insufficient documentation

## 2019-08-12 DIAGNOSIS — Z7984 Long term (current) use of oral hypoglycemic drugs: Secondary | ICD-10-CM | POA: Insufficient documentation

## 2019-08-12 DIAGNOSIS — I110 Hypertensive heart disease with heart failure: Secondary | ICD-10-CM | POA: Insufficient documentation

## 2019-08-12 DIAGNOSIS — I350 Nonrheumatic aortic (valve) stenosis: Secondary | ICD-10-CM | POA: Diagnosis not present

## 2019-08-12 DIAGNOSIS — E119 Type 2 diabetes mellitus without complications: Secondary | ICD-10-CM | POA: Diagnosis not present

## 2019-08-12 DIAGNOSIS — I5032 Chronic diastolic (congestive) heart failure: Secondary | ICD-10-CM | POA: Insufficient documentation

## 2019-08-12 DIAGNOSIS — I4891 Unspecified atrial fibrillation: Secondary | ICD-10-CM | POA: Diagnosis not present

## 2019-08-12 DIAGNOSIS — E785 Hyperlipidemia, unspecified: Secondary | ICD-10-CM | POA: Insufficient documentation

## 2019-08-12 DIAGNOSIS — Z7901 Long term (current) use of anticoagulants: Secondary | ICD-10-CM | POA: Insufficient documentation

## 2019-08-12 DIAGNOSIS — M25569 Pain in unspecified knee: Secondary | ICD-10-CM | POA: Insufficient documentation

## 2019-08-12 DIAGNOSIS — Z79899 Other long term (current) drug therapy: Secondary | ICD-10-CM | POA: Insufficient documentation

## 2019-08-12 HISTORY — PX: RIGHT/LEFT HEART CATH AND CORONARY ANGIOGRAPHY: CATH118266

## 2019-08-12 LAB — POCT I-STAT 7, (LYTES, BLD GAS, ICA,H+H)
Acid-base deficit: 1 mmol/L (ref 0.0–2.0)
Bicarbonate: 25.4 mmol/L (ref 20.0–28.0)
Calcium, Ion: 1.21 mmol/L (ref 1.15–1.40)
HCT: 38 % — ABNORMAL LOW (ref 39.0–52.0)
Hemoglobin: 12.9 g/dL — ABNORMAL LOW (ref 13.0–17.0)
O2 Saturation: 97 %
Potassium: 4.5 mmol/L (ref 3.5–5.1)
Sodium: 139 mmol/L (ref 135–145)
TCO2: 27 mmol/L (ref 22–32)
pCO2 arterial: 46.2 mmHg (ref 32.0–48.0)
pH, Arterial: 7.349 — ABNORMAL LOW (ref 7.350–7.450)
pO2, Arterial: 94 mmHg (ref 83.0–108.0)

## 2019-08-12 LAB — POCT I-STAT EG7
Acid-base deficit: 1 mmol/L (ref 0.0–2.0)
Bicarbonate: 25.7 mmol/L (ref 20.0–28.0)
Calcium, Ion: 1.22 mmol/L (ref 1.15–1.40)
HCT: 37 % — ABNORMAL LOW (ref 39.0–52.0)
Hemoglobin: 12.6 g/dL — ABNORMAL LOW (ref 13.0–17.0)
O2 Saturation: 71 %
Potassium: 4.4 mmol/L (ref 3.5–5.1)
Sodium: 139 mmol/L (ref 135–145)
TCO2: 27 mmol/L (ref 22–32)
pCO2, Ven: 47.5 mmHg (ref 44.0–60.0)
pH, Ven: 7.341 (ref 7.250–7.430)
pO2, Ven: 40 mmHg (ref 32.0–45.0)

## 2019-08-12 LAB — GLUCOSE, CAPILLARY
Glucose-Capillary: 104 mg/dL — ABNORMAL HIGH (ref 70–99)
Glucose-Capillary: 77 mg/dL (ref 70–99)

## 2019-08-12 LAB — PROTIME-INR
INR: 1.2 (ref 0.8–1.2)
Prothrombin Time: 15.1 seconds (ref 11.4–15.2)

## 2019-08-12 SURGERY — RIGHT/LEFT HEART CATH AND CORONARY ANGIOGRAPHY
Anesthesia: LOCAL

## 2019-08-12 MED ORDER — SODIUM CHLORIDE 0.9% FLUSH: 3.0000 mL | INTRAVENOUS | Status: DC | PRN

## 2019-08-12 MED ORDER — METFORMIN HCL ER 500 MG PO TB24: 500.0000 mg | ORAL_TABLET | Freq: Two times a day (BID) | ORAL | Status: DC

## 2019-08-12 MED ORDER — FENTANYL CITRATE (PF) 100 MCG/2ML IJ SOLN
INTRAMUSCULAR | Status: DC | PRN
  Administered 2019-08-12: 25 ug via INTRAVENOUS

## 2019-08-12 MED ORDER — SODIUM CHLORIDE 0.9% FLUSH: 3.0000 mL | Freq: Two times a day (BID) | INTRAVENOUS | Status: DC

## 2019-08-12 MED ORDER — MIDAZOLAM HCL 2 MG/2ML IJ SOLN
INTRAMUSCULAR | Status: AC
  Filled 2019-08-12: qty 2

## 2019-08-12 MED ORDER — ONDANSETRON HCL 4 MG/2ML IJ SOLN: 4.0000 mg | Freq: Four times a day (QID) | INTRAMUSCULAR | Status: DC | PRN

## 2019-08-12 MED ORDER — HEPARIN (PORCINE) IN NACL 1000-0.9 UT/500ML-% IV SOLN
INTRAVENOUS | Status: DC | PRN
  Administered 2019-08-12 (×2): 500 mL

## 2019-08-12 MED ORDER — HEPARIN SODIUM (PORCINE) 1000 UNIT/ML IJ SOLN: INTRAMUSCULAR | Status: DC | PRN

## 2019-08-12 MED ORDER — LIDOCAINE HCL (PF) 1 % IJ SOLN
INTRAMUSCULAR | Status: AC
  Filled 2019-08-12: qty 30

## 2019-08-12 MED ORDER — HEPARIN (PORCINE) IN NACL 1000-0.9 UT/500ML-% IV SOLN
INTRAVENOUS | Status: AC
  Filled 2019-08-12: qty 1000

## 2019-08-12 MED ORDER — ASPIRIN 81 MG PO CHEW: 81.0000 mg | CHEWABLE_TABLET | ORAL | Status: DC

## 2019-08-12 MED ORDER — MIDAZOLAM HCL 2 MG/2ML IJ SOLN
INTRAMUSCULAR | Status: DC | PRN
  Administered 2019-08-12: 2 mg via INTRAVENOUS

## 2019-08-12 MED ORDER — HEPARIN SODIUM (PORCINE) 1000 UNIT/ML IJ SOLN
INTRAMUSCULAR | Status: AC
  Filled 2019-08-12: qty 1

## 2019-08-12 MED ORDER — HYDRALAZINE HCL 20 MG/ML IJ SOLN: 10.0000 mg | INTRAMUSCULAR | Status: DC | PRN

## 2019-08-12 MED ORDER — SODIUM CHLORIDE 0.9 % WEIGHT BASED INFUSION: 3.0000 mL/kg/h | INTRAVENOUS | Status: AC

## 2019-08-12 MED ORDER — SODIUM CHLORIDE 0.9 % IV SOLN: 250.0000 mL | INTRAVENOUS | Status: DC | PRN

## 2019-08-12 MED ORDER — FENTANYL CITRATE (PF) 100 MCG/2ML IJ SOLN
INTRAMUSCULAR | Status: AC
  Filled 2019-08-12: qty 2

## 2019-08-12 MED ORDER — ACETAMINOPHEN 325 MG PO TABS: 650.0000 mg | ORAL_TABLET | ORAL | Status: DC | PRN

## 2019-08-12 MED ORDER — IOHEXOL 350 MG/ML SOLN
INTRAVENOUS | Status: DC | PRN
  Administered 2019-08-12: 65 mL via INTRA_ARTERIAL

## 2019-08-12 MED ORDER — LIDOCAINE HCL (PF) 1 % IJ SOLN
INTRAMUSCULAR | Status: DC | PRN
  Administered 2019-08-12: 25 mL

## 2019-08-12 MED ORDER — LABETALOL HCL 5 MG/ML IV SOLN: 10.0000 mg | INTRAVENOUS | Status: DC | PRN

## 2019-08-12 MED ORDER — SODIUM CHLORIDE 0.9 % WEIGHT BASED INFUSION: 1.0000 mL/kg/h | INTRAVENOUS | Status: DC

## 2019-08-12 MED ORDER — SODIUM CHLORIDE 0.9 % IV SOLN: INTRAVENOUS | Status: AC

## 2019-08-12 SURGICAL SUPPLY — 14 items
CATH INFINITI 5FR JL5 (CATHETERS) ×2 IMPLANT
CATH INFINITI 5FR MULTPACK ANG (CATHETERS) ×1 IMPLANT
CATH SWAN GANZ 7F STRAIGHT (CATHETERS) ×2 IMPLANT
KIT HEART LEFT (KITS) ×2 IMPLANT
PACK CARDIAC CATHETERIZATION (CUSTOM PROCEDURE TRAY) ×2 IMPLANT
PINNACLE LONG 5F 25CM (SHEATH) ×2
PINNACLE LONG 7F 25CM (SHEATH) ×2
SHEATH INTRO PINNACLE 7F 25CM (SHEATH) ×1 IMPLANT
SHEATH INTROD PINNACLE 5F 25CM (SHEATH) IMPLANT
TRANSDUCER W/STOPCOCK (MISCELLANEOUS) ×4 IMPLANT
TUBING CIL FLEX 10 FLL-RA (TUBING) ×2 IMPLANT
WIRE EMERALD 3MM-J .025X260CM (WIRE) ×2 IMPLANT
WIRE EMERALD 3MM-J .035X150CM (WIRE) ×1 IMPLANT
WIRE EMERALD ST .035X150CM (WIRE) ×2 IMPLANT

## 2019-08-12 NOTE — Progress Notes (Signed)
PA Estrella Myrtle. Came by to see patient and stated he could restart coumadin tonight and metformin on Friday.

## 2019-08-12 NOTE — Progress Notes (Signed)
Order for sheath removal verified per post procedural orders. Procedure explained to patient and Rt femoral artery and venous  access site assessed: level 0, palpable dorsalis pedis . 74fr and 35fr [place] Sheath removed and manual pressure applied for 20 minutes. Pre, peri, & post procedural vitals: HR 70, RR 18, O2 Sat upper 95-98, BP 150-160/60's, Pain 0. Distal pulses remained intact after sheath removal. Access site level 0 and dressed with 4X4 gauze and tegaderm.   Post procedural instructions discussed and return demonstration from patient.

## 2019-08-12 NOTE — Telephone Encounter (Signed)
Called and spoke to Manuela Schwartz, the nurse at Dr. Irma Newness office. She states that Dr. Sabra Heck wanted to let Dr. Irish Lack know that the patient had a recent fall. She states that she is not sure whether he lost consciousness or not, but the patient had not been eating or drinking that much for several days prior to that time. She states that the patient was on the floor for 3 hours before anyone came to help him and his blood sugar was low. She states that she did not think it was from his AS but wanted to let us know. Made her aware that the patient had his cath today and is being set up for a TAVR. She verbalized understanding and thanked me for the call. Dr. Irish Lack made aware of the above information.

## 2019-08-12 NOTE — Discharge Instructions (Signed)
7 days prior to surgery STOP taking any Aspirin (unless otherwise instructed by your surgeon), Aleve, Naproxen, Ibuprofen, Motrin, Advil, Goody's, BC's, all herbal medications, fish oil, and all vitamins.7 days prior to surgery STOP taking any Aspirin (unless otherwise instructed by your surgeon), Aleve, Naproxen, Ibuprofen, Motrin, Advil, Goody's, BC's, all herbal medications, fish oil, and all vitamins.DRINK PLENTY OF FLUIDS OVER THE NEXT 2-3 DAYS. Femoral Site Care This sheet gives you information about how to care for yourself after your procedure. Your health care provider may also give you more specific instructions. If you have problems or questions, contact your health care provider. What can I expect after the procedure? After the procedure, it is common to have:  Bruising that usually fades within 1-2 weeks.  Tenderness at the site. Follow these instructions at home: Wound care  Follow instructions from your health care provider about how to take care of your insertion site. Make sure you: ? Wash your hands with soap and water before you change your bandage (dressing). If soap and water are not available, use hand sanitizer. ? Change your dressing as told by your health care provider. ? Leave stitches (sutures), skin glue, or adhesive strips in place. These skin closures may need to stay in place for 2 weeks or longer. If adhesive strip edges start to loosen and curl up, you may trim the loose edges. Do not remove adhesive strips completely unless your health care provider tells you to do that.  Do not take baths, swim, or use a hot tub until your health care provider approves.  You may shower 24-48 hours after the procedure or as told by your health care provider. ? Gently wash the site with plain soap and water. ? Pat the area dry with a clean towel. ? Do not rub the site. This may cause bleeding.  Do not apply powder or lotion to the site. Keep the site clean and dry.  Check  your femoral site every day for signs of infection. Check for: ? Redness, swelling, or pain. ? Fluid or blood. ? Warmth. ? Pus or a bad smell. Activity  For the first 2-3 days after your procedure, or as long as directed: ? Avoid climbing stairs as much as possible. ? Do not squat.  Do not lift anything that is heavier than 10 lb (4.5 kg), or the limit that you are told, until your health care provider says that it is safe.  Rest as directed. ? Avoid sitting for a long time without moving. Get up to take short walks every 1-2 hours.  Do not drive for 24 hours if you were given a medicine to help you relax (sedative). General instructions  Take over-the-counter and prescription medicines only as told by your health care provider.  Keep all follow-up visits as told by your health care provider. This is important. Contact a health care provider if you have:  A fever or chills.  You have redness, swelling, or pain around your insertion site. Get help right away if:  The catheter insertion area swells very fast.  You pass out.  You suddenly start to sweat or your skin gets clammy.  The catheter insertion area is bleeding, and the bleeding does not stop when you hold steady pressure on the area.  The area near or just beyond the catheter insertion site becomes pale, cool, tingly, or numb. These symptoms may represent a serious problem that is an emergency. Do not wait to see if the symptoms  will go away. Get medical help right away. Call your local emergency services (911 in the [place] Do not drive yourself to the hospital. Summary  After the procedure, it is common to have bruising that usually fades within 1-2 weeks.  Check your femoral site every day for signs of infection.  Do not lift anything that is heavier than 10 lb (4.5 kg), or the limit that you are told, until your health care provider says that it is safe. This information is not intended to replace advice given  to you by your health care provider. Make sure you discuss any questions you have with your health care provider. Document Revised: 04/02/2017 Document Reviewed: 04/02/2017 Elsevier Patient Education  2020 [employer].

## 2019-08-12 NOTE — Telephone Encounter (Signed)
Manuela Schwartz, nurse from [place] wanted to speak to Dr. Irish Lack or [place] about this patient. She wanted to make our office aware of an issue before he has surgery

## 2019-08-12 NOTE — Interval H&P Note (Signed)
History and Physical Interval Note:  08/12/2019 10:07 AM  Gregory [name]  has presented today for surgery, with the diagnosis of aortic stenosis.  The various methods of treatment have been discussed with the patient and family. After consideration of risks, benefits and other options for treatment, the patient has consented to  Procedure(s): RIGHT/LEFT HEART CATH AND CORONARY ANGIOGRAPHY (N/A) as a surgical intervention.  The patient's history has been reviewed, patient examined, no change in status, stable for surgery.  I have reviewed the patient's chart and labs.  Questions were answered to the patient's satisfaction.    Plan for diagnostic R/L [place]

## 2019-08-14 ENCOUNTER — Other Ambulatory Visit: Payer: Self-pay

## 2019-08-14 DIAGNOSIS — I350 Nonrheumatic aortic (valve) stenosis: Secondary | ICD-10-CM

## 2019-08-18 ENCOUNTER — Other Ambulatory Visit: Payer: Self-pay

## 2019-08-18 ENCOUNTER — Ambulatory Visit: Payer: PPO | Admitting: Cardiovascular Disease

## 2019-08-18 ENCOUNTER — Encounter: Payer: Self-pay | Admitting: Cardiovascular Disease

## 2019-08-18 VITALS — BP 112/78 | HR 68 | Ht 70.5 in | Wt 265.0 lb

## 2019-08-18 DIAGNOSIS — Z01812 Encounter for preprocedural laboratory examination: Secondary | ICD-10-CM

## 2019-08-18 DIAGNOSIS — I350 Nonrheumatic aortic (valve) stenosis: Secondary | ICD-10-CM | POA: Diagnosis not present

## 2019-08-18 NOTE — Patient Instructions (Signed)
Medication Instructions:  No changes *If you need a refill on your cardiac medications before your next appointment, please call your pharmacy*   Lab Work: Today: BMET If you have labs (blood work) drawn today and your tests are completely normal, you will receive your results only by: Marland Kitchen MyChart Message (if you have MyChart) OR . A paper copy in the mail If you have any lab test that is abnormal or we need to change your treatment, we will call you to review the results.   Testing/Procedures: none   Follow-Up: As scheduled  Other Instructions Theodosia Quay, RN Structural Nurse Navigator will contact you re: next steps.

## 2019-08-18 NOTE — Progress Notes (Signed)
[facility] Consult Note  Chief Complaint  Patient presents with  . New Patient (Initial Visit)    severe aortic stenosis   History of Present Illness: 79 yo male with history of atrial fibrillation on coumadin, chronic diastolic CHF, mitral regurgitation, Non Hodgkins lymphoma, DM, HTN and severe aortic stenosis who is here today as a new consult, referred by Dr. Irish Lack, for further discussion regarding his aortic stenosis and possible TAVR. His aortic stenosis has been moderate. Most recent echo April 2021 with LVEF=55%, mild MR, mild to moderate mitral stenosis and severe aortic stenosis with mean gradient 44 mmHg, peak gradient 74 mmHg, AVA 0.69 cm2, dimensionless index 0.17. Cardiac cath May 2021 with no significant CAD.   He tells me today that he has progressive fatigue and dyspnea. No chest pain. No recent LE edema. He has seen dentists but is having active dental problems. He lives in [place] alone. He is a retired Administrator.  Primary Care Physician: Kathyrn Lass, MD Primary Cardiologist: Irish Lack Referring Cardiologist: Irish Lack  Past Medical History:  Diagnosis Date  . 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 Aortic stenosis   . Cancer ([place])    NHL  . Diabetes mellitus   . History of kidney stones   . Hypertension   . Other malignant lymphomas, unspecified site, extranodal and solid organ sites    NHL  . Ruptured disk   . Ulcer     Past Surgical History:  Procedure Laterality Date  . APPENDECTOMY  1967  . left knee surgery    . left shoulder surgery    . RIGHT/LEFT HEART CATH AND CORONARY ANGIOGRAPHY N/A 08/12/2019   Procedure: RIGHT/LEFT HEART CATH AND CORONARY ANGIOGRAPHY;  Surgeon: Jettie Booze, MD;  Location: [facility];  Service: Cardiovascular;  Laterality: N/A;  . TONSILLECTOMY      Current Outpatient Medications  Medication Sig Dispense Refill  . atorvastatin (LIPITOR) 20 MG tablet Take 20 mg by  mouth daily at 6 PM.     . diltiazem (CARDIZEM CD) 120 MG 24 hr capsule Take 120 mg by mouth daily at 6 PM.    . glimepiride (AMARYL) 4 MG tablet Take 4 mg by mouth daily with breakfast.     . lisinopril (PRINIVIL,ZESTRIL) 10 MG tablet Take 10 mg by mouth daily.    . metFORMIN (GLUCOPHAGE-XR) 500 MG 24 hr tablet Take 1 tablet (500 mg total) by mouth 2 (two) times daily.    . metoprolol succinate (TOPROL-XL) 100 MG 24 hr tablet Take 100 mg by mouth daily. Take with or immediately following a meal.    . Multiple Vitamins-Minerals (PRESERVISION AREDS 2 PO) Take 1 tablet by mouth daily.    . ONE TOUCH ULTRA TEST test strip 1 each by Other route as needed (BLOOD SUGAR).     Glory Rosebush DELICA LANCETS 99991111 MISC Apply 1 each topically daily as needed (BLOOD SUGAR).     Marland Kitchen pioglitazone (ACTOS) 15 MG tablet Take 15 mg by mouth daily.     Marland Kitchen warfarin (COUMADIN) 6 MG tablet Take 6 mg by mouth See admin instructions. 3 mg on Thursday and Saturday, 6 mg all other days     No current facility-administered medications for this visit.    Allergies  Allergen Reactions  . Bisoprolol-Hydrochlorothiazide Other (See Comments)    "Severe" arthralgias and myalgias  . Nsaids Other (See Comments)    GI upset, Peptic ulcer disease  Social History   Socioeconomic History  . Marital status: Single    Spouse name: Not on file  . Number of children: 0  . Years of education: Not on file  . Highest education level: Not on file  Occupational History  . Occupation: Retired-Trucking  Tobacco Use  . Smoking status: Never Smoker  . Smokeless tobacco: Never Used  Substance and Sexual Activity  . Alcohol use: No  . Drug use: No  . Sexual activity: Never  Other Topics Concern  . Not on file  Social History Narrative  . Not on file   Social Determinants of Health   Financial Resource Strain:   . Difficulty of Paying Living Expenses:   Food Insecurity:   . Worried About Charity fundraiser in the Last Year:     . Arboriculturist in the Last Year:   Transportation Needs:   . Film/video editor (Medical):   Marland Kitchen Lack of Transportation (Non-Medical):   Physical Activity:   . Days of Exercise per Week:   . Minutes of Exercise per Session:   Stress:   . Feeling of Stress :   Social Connections:   . Frequency of Communication with Friends and Family:   . Frequency of Social Gatherings with Friends and Family:   . Attends Religious Services:   . Active Member of Clubs or Organizations:   . Attends Archivist Meetings:   Marland Kitchen Marital Status:   Intimate Partner Violence:   . Fear of Current or Ex-Partner:   . Emotionally Abused:   Marland Kitchen Physically Abused:   . Sexually Abused:     Family History  Problem Relation Age of Onset  . Hypertension Father   . Heart attack Father   . Heart attack Mother   . Cancer Mother        LUNG  . Stroke Neg Hx     Review of Systems:  As stated in the HPI and otherwise negative.   BP 112/78   Pulse 68   Ht 5' 10.5" (1.791 m)   Wt 265 lb (120.2 kg)   SpO2 98%   BMI 37.49 kg/m   Physical Examination: General: Well developed, well nourished, NAD  HEENT: OP clear, mucus membranes moist  SKIN: warm, dry. No rashes. Neuro: No focal deficits  Musculoskeletal: Muscle strength 5/5 all ext  Psychiatric: Mood and affect normal  Neck: No JVD, no carotid bruits, no thyromegaly, no lymphadenopathy.  Lungs:Clear bilaterally, no wheezes, rhonci, crackles Cardiovascular: Irregular. Loud, harsh, late peaking systolic murmur.  Abdomen:Soft. Bowel sounds present. Non-tender.  Extremities: No lower extremity edema. Pulses are 2 + in the bilateral DP/PT.  EKG:  EKG is not ordered today. The ekg ordered today demonstrates   Echo April 2021: 1. Left ventricular ejection fraction, by estimation, is 55%. The left  ventricle has normal function. Left ventricular endocardial border not  optimally defined to evaluate regional wall motion. Left ventricular   diastolic parameters are indeterminate.  Elevated left ventricular end-diastolic pressure. There is septal shudder  with mild respiratory related septal shift. Study was not protocoled to  assess for constrictive physiology.  2. Right ventricular systolic function is normal. The right ventricular  size is normal. There is severely elevated pulmonary artery systolic  pressure. The estimated right ventricular systolic pressure is 99991111 mmHg.  3. Left atrial size was severely dilated.  4. Right atrial size was severely dilated.  5. Calcification of the aortomitral continuity with severe aortic valve  stenosis and moderate mitral valve stesnosis. Findings suggestive of  radiation heart disease.  6. The mitral valve is abnormal. Mild to moderate mitral valve  regurgitation. Moderate mitral stenosis. The mean mitral valve gradient is  6.0 mmHg with average heart rate of 74 bpm.  7. The aortic valve is abnormal. Aortic valve regurgitation is not  visualized. Severe aortic valve stenosis. Aortic valve mean gradient  measures 44.0 mmHg.  8. Aortic dilatation noted. There is mild dilatation of the ascending  aorta measuring 41 mm.  9. The inferior vena cava is dilated in size with <50% respiratory  variability, suggesting right atrial pressure of 15 mmHg.   Comparison(s): A prior study was performed on 05/30/2018. Comparison made  by report only. No significant change in biventricular function, aortic  stenosis or mitral valve stenosis.   FINDINGS  Left Ventricle: Left ventricular ejection fraction, by estimation, is  55%. The left ventricle has normal function. Left ventricular endocardial  border not optimally defined to evaluate regional wall motion. The left  ventricular internal cavity size was  normal in size. There is no left ventricular hypertrophy. Septal shudder  with mild respiratory related septal shift. Study was not protocoled to  assess for constrictive physiology.  Left ventricular diastolic parameters  are indeterminate. Elevated left  ventricular end-diastolic pressure.   Right Ventricle: The right ventricular size is normal. No increase in  right ventricular wall thickness. Right ventricular systolic function is  normal. There is severely elevated pulmonary artery systolic pressure. The  tricuspid regurgitant velocity is  3.41 m/s, and with an assumed right atrial pressure of 15 mmHg, the  estimated right ventricular systolic pressure is 99991111 mmHg.   Left Atrium: Left atrial size was severely dilated.   Right Atrium: Right atrial size was severely dilated.   Pericardium: There is no evidence of pericardial effusion.   Mitral Valve: The mitral valve is abnormal. There is moderate  calcification of the mitral valve leaflet(s). Moderately decreased  mobility of the mitral valve leaflets. Severe mitral annular  calcification. Mild to moderate mitral valve regurgitation.  Moderate mitral valve stenosis. MV peak gradient, 22.7 mmHg. The mean  mitral valve gradient is 6.0 mmHg with average heart rate of 74 bpm.   Tricuspid Valve: The tricuspid valve is normal in structure. Tricuspid  valve regurgitation is mild . No evidence of tricuspid stenosis.   Aortic Valve: The aortic valve is abnormal. Aortic valve regurgitation is  not visualized. Severe aortic stenosis is present. There is severe  calcifcation of the aortic valve. Aortic valve mean gradient measures 44.0  mmHg. Aortic valve peak gradient  measures 74.6 mmHg. Aortic valve area, by VTI measures 0.69 cm.   Pulmonic Valve: The pulmonic valve was normal in structure. Pulmonic valve  regurgitation is mild. No evidence of pulmonic stenosis.   Aorta: Aortic dilatation noted. There is mild dilatation of the ascending  aorta measuring 41 mm.   Venous: The inferior vena cava is dilated in size with less than 50%  respiratory variability, suggesting right atrial pressure of 15 mmHg.    IAS/Shunts: No atrial level shunt detected by color flow Doppler.     LEFT VENTRICLE  PLAX 2D  LVIDd:     4.91 cm Diastology  LVIDs:     2.59 cm LV e' lateral:  6.85 cm/s  LV PW:     1.11 cm LV E/e' lateral: 31.0  LV IVS:    0.90 cm LV e' medial:  7.07 cm/s  LVOT diam:  2.30 cm LV E/e' medial: 30.1  LV SV:     76  LV SV Index:  32  LVOT Area:   4.15 cm     LEFT ATRIUM       Index    RIGHT ATRIUM      Index  LA diam:    5.80 cm 2.44 cm/m RA Area:   35.40 cm  LA Vol (A2C):  217.0 ml 91.46 ml/m RA Volume:  133.00 ml 56.06 ml/m  LA Vol (A4C):  159.0 ml 67.02 ml/m  LA Biplane Vol: 186.0 ml 78.40 ml/m  AORTIC VALVE  AV Area (Vmax):  0.68 cm  AV Area (Vmean):  0.64 cm  AV Area (VTI):   0.69 cm  AV Vmax:      432.00 cm/s  AV Vmean:     303.000 cm/s  AV VTI:      1.100 m  AV Peak Grad:   74.6 mmHg  AV Mean Grad:   44.0 mmHg  LVOT Vmax:     70.20 cm/s  LVOT Vmean:    46.800 cm/s  LVOT VTI:     0.182 m  LVOT/AV VTI ratio: 0.17    AORTA  Ao Root diam: 3.80 cm  Ao Asc diam: 4.10 cm   MITRAL VALVE        TRICUSPID VALVE  MV Area (PHT): 2.42 cm   TR Peak grad:  46.5 mmHg  MV Peak grad: 22.7 mmHg  TR Vmax:    341.00 cm/s  MV Mean grad: 6.0 mmHg  MV Vmax:    2.38 m/s   SHUNTS  MV Vmean:   102.0 cm/s  Systemic VTI: 0.18 m  MV PHT:    144.35 msec Systemic Diam: 2.30 cm  MV Decel Time: 319 msec  MR Peak grad: 176.4 mmHg  MR Mean grad: 113.0 mmHg  MR Vmax:   664.00 cm/s  MR Vmean:   502.0 cm/s    MV E velocity: 212.50 cm/s   Cardiac cath May 2021:  The left ventricular systolic function is normal.  LV end diastolic pressure is normal.  The left ventricular ejection fraction is 55-65% by visual estimate.  There is severe aortic valve stenosis. Mean gradient 46 mm Hg.  There is mild mitral valve stenosis. Mitral valve  area 2.1 cm2. Mean gradient 11 mm Hg.  Hemodynamic findings consistent with mild pulmonary hypertension.  No significant CAD.   No significant CAD.  Plan for TAVR w/u.    Fick Cardiac Output 6.57 L/min  Fick Cardiac Output Index 2.81 (L/min)/BSA  Thermal Cardiac Output 2.55 L/min  Thermal Cardiac Output Index 1.09 (L/min)/BSA  Aortic Mean Gradient 45.68 mmHg  Aortic Peak Gradient 65 mmHg  Aortic Valve Area 1.05  Aortic Value Area Index 0.45 cm2/BSA  Mitral Mean Gradient 11.44 mmHg  Mitral Peak Gradient 9 mmHg  Mitral Valve Area Index 0.67 cm2/BSA  RA A Wave -99 mmHg  RA V Wave 9 mmHg  RA Mean 8 mmHg  RV Systolic Pressure 66 mmHg  RV Diastolic Pressure 0 mmHg  RV EDP 7 mmHg  PA Systolic Pressure 65 mmHg  PA Diastolic Pressure 16 mmHg  PA Mean 33 mmHg  PW A Wave -99 mmHg  PW V Wave 29 mmHg  PW Mean 20 mmHg  AO Systolic Pressure [id number] mmHg  AO Diastolic Pressure 51 mmHg  AO Mean 75 mmHg  LV Systolic Pressure [id number] mmHg  LV Diastolic Pressure 0 mmHg  LV EDP 10 mmHg  AOp Systolic Pressure  117 mmHg  AOp Diastolic Pressure 53 mmHg  AOp Mean Pressure 76 mmHg  LVp Systolic Pressure 0000000 mmHg  LVp Diastolic Pressure 1 mmHg  LVp EDP Pressure 11 mmHg  QP/QS 1  TPVR Index 11.76 HRUI  TSVR Index 26.73 HRUI  PVR SVR Ratio 0.21  TPVR/TSVR Ratio 0.44     Recent Labs: 07/28/2019: BUN 20; Creatinine, Ser 1.14; Platelets 203 08/12/2019: Hemoglobin 12.6; Potassium 4.4; Sodium 139   Lipid Panel No results found for: CHOL, TRIG, HDL, CHOLHDL, VLDL, LDLCALC, LDLDIRECT   Wt Readings from Last 3 Encounters:  08/18/19 265 lb (120.2 kg)  08/12/19 256 lb (116.1 kg)  07/28/19 268 lb (121.6 kg)     Other studies Reviewed: Additional studies/ records that were reviewed today include: echo images, cath images, office notes. Review of the above records demonstrates: severe AS   Assessment and Plan:   1. Severe Aortic Valve Stenosis: He has severe, stage D aortic valve stenosis. I have  personally reviewed the echo images. The aortic valve is thickened, calcified with limited leaflet mobility. I think he would benefit from AVR. Given advanced age, he is not a good candidate for conventional AVR by surgical approach. I think he may be a good candidate for TAVR. He also has moderate mitral stenosis.   STS Risk Score: Risk of Mortality: 1.282% Renal Failure: 1.925% Permanent Stroke: 1.012% Prolonged Ventilation: 6.057% DSW Infection: 0.131% Reoperation: 3.059% Morbidity or Mortality: 10.783% Short Length of Stay: 38.595% Long Length of Stay: 4.535%  I have reviewed the natural history of aortic stenosis with the patient and their family members  who are present today. We have discussed the limitations of medical therapy and the poor prognosis associated with symptomatic aortic stenosis. We have reviewed potential treatment options, including palliative medical therapy, conventional surgical aortic valve replacement, and transcatheter aortic valve replacement. We discussed treatment options in the context of the patient's specific comorbid medical conditions.   He would like to proceed with planning for TAVR. He has completed his heart cath. Risks and benefits of the valve procedure are reviewed with the patient. We will arrange a cardiac CT, CTA of the chest/abdomen and pelvis, carotid artery dopplers, PT assessment and a dental consult and he will then be referred to see one of the CT surgeons on our TAVR team. Will discuss his mitral stenosis as well but at this time, I think it may be best to proceed with TAVR as his mitral stenosis is only moderate.      Current medicines are reviewed at length with the patient today.  The patient does not have concerns regarding medicines.  The following changes have been made:  no change  Labs/ tests ordered today include:   Orders Placed This Encounter  Procedures  . Basic metabolic panel     Disposition:   F/U with the  valve team.    Signed, Lauree Chandler, MD 08/18/2019 2:57 PM    [address], [place], [place]  [address] Phone: [phone]; Fax: [phone]

## 2019-08-19 ENCOUNTER — Other Ambulatory Visit: Payer: Self-pay

## 2019-08-19 DIAGNOSIS — I350 Nonrheumatic aortic (valve) stenosis: Secondary | ICD-10-CM

## 2019-08-20 DIAGNOSIS — I4891 Unspecified atrial fibrillation: Secondary | ICD-10-CM | POA: Diagnosis not present

## 2019-08-20 DIAGNOSIS — I10 Essential (primary) hypertension: Secondary | ICD-10-CM | POA: Diagnosis not present

## 2019-08-20 DIAGNOSIS — E1140 Type 2 diabetes mellitus with diabetic neuropathy, unspecified: Secondary | ICD-10-CM | POA: Diagnosis not present

## 2019-08-20 DIAGNOSIS — E7800 Pure hypercholesterolemia, unspecified: Secondary | ICD-10-CM | POA: Diagnosis not present

## 2019-08-20 DIAGNOSIS — M1711 Unilateral primary osteoarthritis, right knee: Secondary | ICD-10-CM | POA: Diagnosis not present

## 2019-08-21 ENCOUNTER — Ambulatory Visit (HOSPITAL_COMMUNITY)
Admission: RE | Admit: 2019-08-21 | Discharge: 2019-08-21 | Disposition: A | Discharge status: Home / Self Care | Payer: PPO | Source: Ambulatory Visit | Attending: Cardiovascular Disease | Admitting: Cardiovascular Disease

## 2019-08-21 ENCOUNTER — Other Ambulatory Visit: Payer: Self-pay

## 2019-08-21 DIAGNOSIS — I350 Nonrheumatic aortic (valve) stenosis: Secondary | ICD-10-CM

## 2019-08-21 DIAGNOSIS — Z01818 Encounter for other preprocedural examination: Secondary | ICD-10-CM | POA: Diagnosis not present

## 2019-08-21 DIAGNOSIS — D3501 Benign neoplasm of right adrenal gland: Secondary | ICD-10-CM | POA: Diagnosis not present

## 2019-08-21 MED ORDER — IOHEXOL 350 MG/ML SOLN
100.0000 mL | Freq: Once | INTRAVENOUS | Status: AC | PRN
  Administered 2019-08-21: 100 mL via INTRAVENOUS

## 2019-08-21 NOTE — Progress Notes (Signed)
Carotid artery duplex has been completed. Preliminary results can be found in CV Proc through chart review.   08/21/19 1:46 PM Gregory [name] RVT

## 2019-08-25 ENCOUNTER — Encounter (HOSPITAL_COMMUNITY): Payer: Self-pay | Admitting: Dentistry

## 2019-08-25 ENCOUNTER — Other Ambulatory Visit: Payer: Self-pay

## 2019-08-25 ENCOUNTER — Ambulatory Visit (HOSPITAL_COMMUNITY): Payer: Self-pay | Admitting: Dentistry

## 2019-08-25 VITALS — BP 142/61 | HR 49 | Temp 98.5°F

## 2019-08-25 DIAGNOSIS — I350 Nonrheumatic aortic (valve) stenosis: Secondary | ICD-10-CM

## 2019-08-25 DIAGNOSIS — K045 Chronic apical periodontitis: Secondary | ICD-10-CM | POA: Diagnosis not present

## 2019-08-25 DIAGNOSIS — K083 Retained dental root: Secondary | ICD-10-CM

## 2019-08-25 DIAGNOSIS — M264 Malocclusion, unspecified: Secondary | ICD-10-CM

## 2019-08-25 DIAGNOSIS — K0530 Chronic periodontitis, unspecified: Secondary | ICD-10-CM

## 2019-08-25 DIAGNOSIS — K031 Abrasion of teeth: Secondary | ICD-10-CM

## 2019-08-25 DIAGNOSIS — K0889 Other specified disorders of teeth and supporting structures: Secondary | ICD-10-CM

## 2019-08-25 DIAGNOSIS — Z01818 Encounter for other preprocedural examination: Secondary | ICD-10-CM

## 2019-08-25 DIAGNOSIS — K06010 Localized gingival recession, unspecified: Secondary | ICD-10-CM

## 2019-08-25 DIAGNOSIS — K036 Deposits [accretions] on teeth: Secondary | ICD-10-CM

## 2019-08-25 DIAGNOSIS — Z9189 Other specified personal risk factors, not elsewhere classified: Secondary | ICD-10-CM

## 2019-08-25 DIAGNOSIS — K029 Dental caries, unspecified: Secondary | ICD-10-CM

## 2019-08-25 DIAGNOSIS — K08409 Partial loss of teeth, unspecified cause, unspecified class: Secondary | ICD-10-CM

## 2019-08-25 DIAGNOSIS — K030 Excessive attrition of teeth: Secondary | ICD-10-CM | POA: Diagnosis not present

## 2019-08-25 DIAGNOSIS — Z98811 Dental restoration status: Secondary | ICD-10-CM

## 2019-08-25 NOTE — Patient Instructions (Addendum)
[place]    Department of Dental Medicine     DR. Bronda Alfred      HEART VALVES AND MOUTH CARE:  FACTS:   If you have any infection in your mouth, it can infect your heart valve.  If you heart valve is infected, you will be seriously ill.  Infections in the mouth can be SILENT and do not always cause pain.  Examples of infections in the mouth are gum disease, dental cavities, and abscesses.  Some possible signs of infection are: Bad breath, bleeding gums, or teeth that are sensitive to sweets, hot, and/or cold. There are many other signs as well.  WHAT YOU HAVE TO DO:   Brush your teeth after meals and at bedtime. Spend at least 2 minutes brushing well, especially behind your back teeth and all around your teeth that stand alone. Brush at the gumline also.  Do not go to bed without brushing your teeth and flossing.  If you gums bleed when you brush or floss, do NOT stop brushing or flossing. It usually means that your gums need more attention and better cleaning.   If your Dentist or Dr. Enrique Sack gave you a prescription mouthwash to use, make sure to use it as directed. If you run out of the medication, get a refill at the pharmacy.   If you were given any other medications or directions by your Dentist, please follow them. If you did not understand the directions or forget what you were told, please call. We will be happy to refresh her memory.  If you need antibiotics before dental procedures, make sure you take them one hour prior to every dental visit as directed.   Get a dental checkup every 4-6 months in order to keep your mouth healthy, or to find and treat any new infection. You will most likely need your teeth cleaned or gums treated at the same time.  If you are not able to come in for your scheduled appointment, call your Dentist as soon as possible to reschedule.  If you have a problem in between dental visits, call your Dentist.   COVID-19 Education: The  signs and symptoms of COVID-19 were discussed with the patient and how to seek care for testing (follow up with PCP or arrange E-visit).   The importance of social distancing was discussed today.  COVID-19 Vaccine Information can be found at: [email or web address].uk For questions related to vaccine distribution or appointments, please email [email or web address] or call [phone].   Patient is currently scheduled for operating room procedure with general anesthesia at [facility] for Thursday, 09/04/2019 at 7:30 AM. Patient will need to hold warfarin therapy after his dose on Friday, 08/29/2019 to allow for dental extraction procedures at [facility] on 09/04/2019. Patient will need to follow-up with presurgical testing at [facility] for labs, discussion with anesthesia, and Covid testing as indicated.   Lenn Cal, DDS

## 2019-08-25 NOTE — Progress Notes (Signed)
DENTAL CONSULTATION  Date of Consultation:  08/25/2019 Patient Name:   Gregory [name] Date of Birth:   17-May-1940 Medical Record Number: [id number]  COVID 19 SCREENING: The patient does not symptoms concerning for COVID-19 infection (Including fever, chills, cough, or new SHORTNESS OF BREATH).  Patient has not had a Covid vaccine.  VITALS: BP (!) 142/61 (BP Location: Right Arm)   Pulse (!) 49   Temp 98.5 F (36.9 C)   CHIEF COMPLAINT: Patient referred by Dr. Angelena Form for a dental consultation.  HPI: Gregory [name] is a 79 year old male recently diagnosed with severe aortic stenosis.  Patient with anticipated heart valve surgery.  Patient is now seen as part of a medically necessary preheart valve surgery dental protocol to rule out dental infection that may affect the patient's systemic health and anticipated heart valve surgery.  The patient currently denies having acute toothaches, swellings, or abscesses.  Patient last saw a dentist approximately 1 1/2  years ago for a partial denture repair.  This was with Dr. Lavonne Chick in [place], [place].  Prior to that, the patient had been seen by [facility].  The patient does not seek regular dental care.  The patient has a maxillary cast partial denture that is ill fitting by report.  Patient does not have a lower partial denture.  Patient denies having dental phobia.   PROBLEM LIST: Patient Active Problem List   Diagnosis Date Noted  . Severe aortic stenosis 05/25/2014    Priority: High  . Acute diastolic heart failure ([place]) 06/13/2015  . Atrial fibrillation with RVR ([place]) 06/13/2015  . Dyspnea 06/13/2015  . Type 2 diabetes mellitus with hyperlipidemia ([place]) 06/13/2015  . Atrial fibrillation ([place]) 08/26/2013  . Mitral valve disorder 08/26/2013  . Mixed hyperlipidemia 08/26/2013  . Obesity 08/26/2013  . Essential hypertension, benign 08/26/2013  . Other malignant lymphomas, unspecified site, extranodal and solid organ  sites 05/06/2012    PMH: Past Medical History:  Diagnosis Date  . 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 Aortic stenosis   . Cancer ([place])    NHL  . Diabetes mellitus   . History of kidney stones   . Hypertension   . Other malignant lymphomas, unspecified site, extranodal and solid organ sites    NHL  . Ruptured disk   . Ulcer     PSH: Past Surgical History:  Procedure Laterality Date  . APPENDECTOMY  1967  . left knee surgery    . left shoulder surgery    . RIGHT/LEFT HEART CATH AND CORONARY ANGIOGRAPHY N/A 08/12/2019   Procedure: RIGHT/LEFT HEART CATH AND CORONARY ANGIOGRAPHY;  Surgeon: Jettie Booze, MD;  Location: [facility];  Service: Cardiovascular;  Laterality: N/A;  . TONSILLECTOMY      ALLERGIES: Allergies  Allergen Reactions  . Macrobid [Nitrofurantoin]   . Nystatin   . Prevnar 13 [Pneumococcal 13-Val Conj Vacc]   . Bisoprolol-Hydrochlorothiazide Other (See Comments)    "Severe" arthralgias and myalgias  . Nsaids Other (See Comments)    GI upset, Peptic ulcer disease    MEDICATIONS: Current Outpatient Medications  Medication Sig Dispense Refill  . atorvastatin (LIPITOR) 20 MG tablet Take 20 mg by mouth daily at 6 PM.     . diltiazem (CARDIZEM CD) 120 MG 24 hr capsule Take 120 mg by mouth daily at 6 PM.    . lisinopril (PRINIVIL,ZESTRIL) 10 MG tablet Take 10 mg by mouth daily.    Marland Kitchen  metFORMIN (GLUCOPHAGE-XR) 500 MG 24 hr tablet Take 1 tablet (500 mg total) by mouth 2 (two) times daily. (Patient taking differently: Take 1,000 mg by mouth 2 (two) times daily. )    . metoprolol succinate (TOPROL-XL) 100 MG 24 hr tablet Take 100 mg by mouth daily. Take with or immediately following a meal.    . Multiple Vitamins-Minerals (PRESERVISION AREDS 2 PO) Take 1 tablet by mouth daily.    . ONE TOUCH ULTRA TEST test strip 1 each by Other route as needed (BLOOD SUGAR).     Glory Rosebush DELICA LANCETS [id number] MISC Apply 1 each topically daily  as needed (BLOOD SUGAR).     Marland Kitchen pioglitazone (ACTOS) 15 MG tablet Take 15 mg by mouth daily.     Marland Kitchen warfarin (COUMADIN) 6 MG tablet Take 6 mg by mouth See admin instructions. 3 mg on Thursday and Saturday, 6 mg all other days     No current facility-administered medications for this visit.    LABS: Lab Results  Component Value Date   WBC 10.5 07/28/2019   HGB 12.6 (L) 08/12/2019   HCT 37.0 (L) 08/12/2019   MCV 90 07/28/2019   PLT 203 07/28/2019      Component Value Date/Time   NA 139 08/12/2019 1048   NA 140 07/28/2019 1508   K 4.4 08/12/2019 1048   CL 102 07/28/2019 1508   CO2 22 07/28/2019 1508   GLUCOSE 174 (H) 07/28/2019 1508   GLUCOSE 238 (H) 06/15/2015 1457   BUN 20 07/28/2019 1508   CREATININE 1.14 07/28/2019 1508   CALCIUM 9.0 07/28/2019 1508   GFRNONAA 61 07/28/2019 1508   GFRAA 70 07/28/2019 1508   Lab Results  Component Value Date   INR 1.2 08/12/2019   INR 2.3 (H) 07/28/2019   INR 2.3 (A) 06/18/2015   No results found for: PTT  SOCIAL HISTORY: Social History   Socioeconomic History  . Marital status: Single    Spouse name: Not on file  . Number of children: 0  . Years of education: Not on file  . Highest education level: Not on file  Occupational History  . Occupation: Retired-Trucking  Tobacco Use  . Smoking status: Never Smoker  . Smokeless tobacco: Never Used  Substance and Sexual Activity  . Alcohol use: No  . Drug use: No  . Sexual activity: Never  Other Topics Concern  . Not on file  Social History Narrative  . Not on file   Social Determinants of Health   Financial Resource Strain:   . Difficulty of Paying Living Expenses:   Food Insecurity:   . Worried About Charity fundraiser in the Last Year:   . Arboriculturist in the Last Year:   Transportation Needs:   . Film/video editor (Medical):   Marland Kitchen Lack of Transportation (Non-Medical):   Physical Activity:   . Days of Exercise per Week:   . Minutes of Exercise per Session:    Stress:   . Feeling of Stress :   Social Connections:   . Frequency of Communication with Friends and Family:   . Frequency of Social Gatherings with Friends and Family:   . Attends Religious Services:   . Active Member of Clubs or Organizations:   . Attends Archivist Meetings:   Marland Kitchen Marital Status:   Intimate Partner Violence:   . Fear of Current or Ex-Partner:   . Emotionally Abused:   Marland Kitchen Physically Abused:   . Sexually Abused:  FAMILY HISTORY: Family History  Problem Relation Age of Onset  . Hypertension Father   . Heart attack Father   . Heart attack Mother   . Cancer Mother        LUNG  . Stroke Neg Hx     REVIEW OF SYSTEMS: Reviewed with the patient as per History of present illness. Psych: Patient denies having dental phobia.  DENTAL HISTORY: CHIEF COMPLAINT: Patient referred by Dr. Angelena Form for a dental consultation.  HPI: ESCHER CERTO is a 79 year old male recently diagnosed with severe aortic stenosis.  Patient with anticipated heart valve surgery.  Patient is now seen as part of a medically necessary preheart valve surgery dental protocol to rule out dental infection that may affect the patient's systemic health and anticipated heart valve surgery.  The patient currently denies having acute toothaches, swellings, or abscesses.  Patient last saw a dentist approximately 1 1/2  years ago for a partial denture repair.  This was with Dr. Lavonne Chick in [place], [place].  Prior to that, the patient had been seen by [facility].  The patient does not seek regular dental care.  The patient has a maxillary cast partial denture that is ill fitting by report.  Patient does not have a lower partial denture.  Patient denies having dental phobia.   DENTAL EXAMINATION: GENERAL: The patient is a well-developed, well-nourished male in no acute distress. HEAD AND NECK: There is no palpable neck lymphadenopathy.  The patient denies acute TMJ  symptoms. INTRAORAL EXAM: The patient has normal saliva.  I do not see any evidence of oral abscess formation. DENTITION: Patient is missing tooth numbers 1, 3, 4, 5, 6, 10, 12, 15, 16, 17 -19, 25, 29, and 32.  The space between tooth numbers 24 and 26 has been closed after orthodontic therapy.  There is evidence of mandibular anterior incisal attrition.Multiple flexure lesions are noted. PERIODONTAL: Patient has chronic periodontitis with plaque and calculus accumulations, generalized gingival recession, and moderate bone loss.  Tooth mobility is noted as per dental charting form. DENTAL CARIES/SUBOPTIMAL RESTORATIONS: Multiple dental caries are noted as per dental charting form.  Multiple flexure lesions are noted.  ENDODONTIC: The patient currently denies acute pulpitis symptoms.  The patient has multiple areas of periapical pathology and radiolucency.  Patient has had previous root canal therapy associated with tooth numbers 13, 14, and 21. CROWN AND BRIDGE: There are multiple crown restorations associated with tooth numbers 2, 13, 14, 21, and 28.  There are recurrent caries around tooth #2. PROSTHODONTIC: The patient has a maxillary cast partial denture that is ill fitting.  There is no lower partial denture. OCCLUSION: Patient has a poor occlusal scheme secondary to multiple missing teeth, multiple retained root segments, ill fitting maxillary cast partial denture, and lack of replacement of missing teeth with clinically acceptable dental prostheses.  RADIOGRAPHIC INTERPRETATION: Orthopantogram was taken and supplemented with 14 periapical radiographs. There are multiple missing teeth.  There are multiple retained root segments.  There are multiple areas of periapical pathology and radiolucency.  There are multiple areas of dental caries.  There is moderate bone loss.  ASSESSMENTS: 1.  Severe aortic stenosis 2.  Preheart valve surgery dental protocol 3.  Chronic apical periodontitis 4.   Retained root segments 5.  Dental caries 6.  Suboptimal dental restorations 7.  Multiple flexure lesions 8.  Mandibular anterior incisal attrition 9.  Chronic periodontitis with bone loss 10.  Generalized gingival recession  11.  Accretions 12.  Tooth mobility 13.  Ill fitting maxillary cast partial denture 14.  Poor occlusal scheme and malocclusion 15.  Risk of bleeding with invasive dental procedures due to current warfarin therapy 16.  Need for antibiotic premedication prior to invasive dental procedures due to the nearness to the anticipated heart valve surgery. 65.  Risk for complications up to and including death due to the patient's overall cardiovascular compromise.   PLAN/RECOMMENDATIONS: 1. I discussed the risks, benefits, and complications of various treatment options with the patient in relationship to his medical and dental conditions, anticipated heart valve surgery, and risk for endocarditis. We discussed various treatment options to include no treatment, total and subtotal extractions with alveoloplasty, pre-prosthetic surgery as indicated, periodontal therapy, dental restorations, root canal therapy, crown and bridge therapy, implant therapy, and replacement of missing teeth as indicated. The patient currently wishes to proceed with extraction of the maxillary teeth as well as tooth numbers 20, 30, and 31 with alveoloplasty and gross debridement of remaining dentition in the operating room with general anesthesia.  This has been scheduled for Thursday, 09/04/2019 at 7:30 AM at [facility].  The Coumadin therapy will be held after his evening dose on Friday, 08/29/2019 to allow for dental surgical procedures on Thursday, 09/04/2019.  The patient will then need to follow-up with a dentist of his choice for fabrication of maxillary complete denture and a lower mandibular partial denture after adequate healing and once medically stable from the anticipated heart valve surgery.  The  patient will be scheduled for heart valve surgery after adequate healing from dental extractions at the discretion of Dr. Angelena Form and Cyndia Bent.   2. Discussion of findings with medical team and coordination of future medical and dental care as needed.  I spent in excess of  120 minutes during the conduct of this consultation and >50% of this time involved direct face-to-face encounter for counseling and/or coordination of the patient's care.    Lenn Cal, DDS

## 2019-08-25 NOTE — Addendum Note (Signed)
Addended by: Lenn Cal on: 08/25/2019 02:36 PM   Modules accepted: Orders, SmartSet

## 2019-08-26 ENCOUNTER — Ambulatory Visit: Payer: PPO | Admitting: Interventional Cardiology

## 2019-08-29 NOTE — Progress Notes (Signed)
[place], [place] [address] Phone: [phone] Fax: [phone]      Your procedure is scheduled on Thursday, 09/04/2019.  Report to [facility] Main Entrance "A" at 05:30 A.M., and check in at the Admitting office.  Call this number if you have problems the morning of surgery:  [phone]  Call [phone] if you have any questions prior to your surgery date Monday-Friday 8am-4pm    Remember:  Do not eat or drink after midnight the night before your surgery    Take these medicines the morning of surgery with A SIP OF WATER: Metoprolol succinate (Toprol-XL)   As of today, STOP taking any Aspirin (unless otherwise instructed by your surgeon) and Aspirin containing products, Aleve, Naproxen, Ibuprofen, Motrin, Advil, Goody's, BC's, all herbal medications, fish oil, and all vitamins.  Follow your surgeon's instructions on when to stop Warfarin (Coumadin).  If no instructions were given by your surgeon then you will need to call the office to get those instructions.    WHAT DO I DO ABOUT MY DIABETES MEDICATION?  Marland Kitchen Do not take oral diabetes medicines (pills) the morning of surgery. - do NOT take Metformin (Glucophage-XR) or Pioglitazone (Actos) the morning of surgery.   HOW TO MANAGE YOUR DIABETES BEFORE AND AFTER SURGERY  Why is it important to control my blood sugar before and after surgery? . Improving blood sugar levels before and after surgery helps healing and can limit problems. . A way of improving blood sugar control is eating a healthy diet by: o  Eating less sugar and carbohydrates o  Increasing activity/exercise o  Talking with your doctor about reaching your blood sugar goals . High blood sugars (greater than 180 mg/dL) can raise your risk of infections and slow your recovery, so you will need to focus on controlling your diabetes during the weeks before surgery. . Make  sure that the doctor who takes care of your diabetes knows about your planned surgery including the date and location.  How do I manage my blood sugar before surgery? . Check your blood sugar at least 4 times a day, starting 2 days before surgery, to make sure that the level is not too high or low. . Check your blood sugar the morning of your surgery when you wake up and every 2 hours until you get to the Short Stay unit. o If your blood sugar is less than 70 mg/dL, you will need to treat for low blood sugar: - Do not take insulin. - Treat a low blood sugar (less than 70 mg/dL) with  cup of clear juice (cranberry or apple), 4 glucose tablets, OR glucose gel. - Recheck blood sugar in 15 minutes after treatment (to make sure it is greater than 70 mg/dL). If your blood sugar is not greater than 70 mg/dL on recheck, call [phone] for further instructions. . Report your blood sugar to the short stay nurse when you get to Short Stay.  . If you are admitted to the hospital after surgery: o Your blood sugar will be checked by the staff and you will probably be given insulin after surgery (instead of oral diabetes medicines) to make sure you have good blood sugar levels. o The goal for blood sugar control after surgery is 80-180 mg/dL.                     Do not wear jewelry  Do not wear lotions, powders, colognes, or deodorant.            Men may shave face and neck.            Do not bring valuables to the hospital.            [facility] is not responsible for any belongings or valuables.  Do NOT Smoke (Tobacco/Vapping) or drink Alcohol 24 hours prior to your procedure  If you use a CPAP at night, you may bring all equipment for your overnight stay.   Contacts, glasses, dentures or bridgework may not be worn into surgery.      For patients admitted to the hospital, discharge time will be determined by your treatment team.   Patients discharged the day of surgery will not be  allowed to drive home, and someone needs to stay with them for 24 hours.    Special instructions:   [place]- Preparing For Surgery  Before surgery, you can play an important role. Because skin is not sterile, your skin needs to be as free of germs as possible. You can reduce the number of germs on your skin by washing with CHG (chlorahexidine gluconate) Soap before surgery.  CHG is an antiseptic cleaner which kills germs and bonds with the skin to continue killing germs even after washing.    Oral Hygiene is also important to reduce your risk of infection.  Remember - BRUSH YOUR TEETH THE MORNING OF SURGERY WITH YOUR REGULAR TOOTHPASTE  Please do not use if you have an allergy to CHG or antibacterial soaps. If your skin becomes reddened/irritated stop using the CHG.  Do not shave (including legs and underarms) for at least 48 hours prior to first CHG shower. It is OK to shave your face.  Please follow these instructions carefully.   1. Shower the NIGHT BEFORE SURGERY and the MORNING OF SURGERY with CHG Soap.   2. If you chose to wash your hair, wash your hair first as usual with your normal shampoo.  3. After you shampoo, rinse your hair and body thoroughly to remove the shampoo.  4. Use CHG as you would any other liquid soap. You can apply CHG directly to the skin and wash gently with a scrungie or a clean washcloth.   5. Apply the CHG Soap to your body ONLY FROM THE NECK DOWN.  Do not use on open wounds or open sores. Avoid contact with your eyes, ears, mouth and genitals (private parts). Wash Face and genitals (private parts)  with your normal soap.   6. Wash thoroughly, paying special attention to the area where your surgery will be performed.  7. Thoroughly rinse your body with warm water from the neck down.  8. DO NOT shower/wash with your normal soap after using and rinsing off the CHG Soap.  9. Pat yourself dry with a CLEAN TOWEL.  10. Wear CLEAN PAJAMAS to bed the night  before surgery, wear comfortable clothes the morning of surgery  11. Place CLEAN SHEETS on your bed the night of your first shower and DO NOT SLEEP WITH PETS.   Day of Surgery:   Do not apply any deodorants/lotions.  Please wear clean clothes to the hospital/surgery center.   Remember to brush your teeth WITH YOUR REGULAR TOOTHPASTE.   Please read over the following fact sheets that you were given.

## 2019-09-01 DIAGNOSIS — H00015 Hordeolum externum left lower eyelid: Secondary | ICD-10-CM | POA: Diagnosis not present

## 2019-09-02 ENCOUNTER — Other Ambulatory Visit: Payer: Self-pay

## 2019-09-02 ENCOUNTER — Other Ambulatory Visit (HOSPITAL_COMMUNITY)
Admission: RE | Admit: 2019-09-02 | Discharge: 2019-09-02 | Disposition: A | Discharge status: Home / Self Care | Payer: PPO | Source: Ambulatory Visit | Attending: Dentistry | Admitting: Dentistry

## 2019-09-02 ENCOUNTER — Encounter (HOSPITAL_COMMUNITY): Payer: Self-pay

## 2019-09-02 ENCOUNTER — Encounter (HOSPITAL_COMMUNITY)
Admission: RE | Admit: 2019-09-02 | Discharge: 2019-09-02 | Disposition: A | Discharge status: Home / Self Care | Payer: PPO | Source: Ambulatory Visit | Attending: Dentistry | Admitting: Dentistry

## 2019-09-02 DIAGNOSIS — M1990 Unspecified osteoarthritis, unspecified site: Secondary | ICD-10-CM | POA: Diagnosis not present

## 2019-09-02 DIAGNOSIS — Z7984 Long term (current) use of oral hypoglycemic drugs: Secondary | ICD-10-CM | POA: Diagnosis not present

## 2019-09-02 DIAGNOSIS — Z89021 Acquired absence of right finger(s): Secondary | ICD-10-CM | POA: Diagnosis not present

## 2019-09-02 DIAGNOSIS — Z01818 Encounter for other preprocedural examination: Secondary | ICD-10-CM | POA: Diagnosis not present

## 2019-09-02 DIAGNOSIS — Z9221 Personal history of antineoplastic chemotherapy: Secondary | ICD-10-CM | POA: Insufficient documentation

## 2019-09-02 DIAGNOSIS — Z20822 Contact with and (suspected) exposure to covid-19: Secondary | ICD-10-CM | POA: Insufficient documentation

## 2019-09-02 DIAGNOSIS — Z79899 Other long term (current) drug therapy: Secondary | ICD-10-CM | POA: Diagnosis not present

## 2019-09-02 DIAGNOSIS — I5032 Chronic diastolic (congestive) heart failure: Secondary | ICD-10-CM | POA: Insufficient documentation

## 2019-09-02 DIAGNOSIS — I110 Hypertensive heart disease with heart failure: Secondary | ICD-10-CM | POA: Diagnosis not present

## 2019-09-02 DIAGNOSIS — Z8572 Personal history of non-Hodgkin lymphomas: Secondary | ICD-10-CM | POA: Insufficient documentation

## 2019-09-02 DIAGNOSIS — E119 Type 2 diabetes mellitus without complications: Secondary | ICD-10-CM | POA: Diagnosis not present

## 2019-09-02 DIAGNOSIS — I080 Rheumatic disorders of both mitral and aortic valves: Secondary | ICD-10-CM | POA: Insufficient documentation

## 2019-09-02 DIAGNOSIS — Z7901 Long term (current) use of anticoagulants: Secondary | ICD-10-CM | POA: Insufficient documentation

## 2019-09-02 DIAGNOSIS — Z8711 Personal history of peptic ulcer disease: Secondary | ICD-10-CM | POA: Insufficient documentation

## 2019-09-02 DIAGNOSIS — Z87442 Personal history of urinary calculi: Secondary | ICD-10-CM | POA: Diagnosis not present

## 2019-09-02 DIAGNOSIS — I4891 Unspecified atrial fibrillation: Secondary | ICD-10-CM | POA: Insufficient documentation

## 2019-09-02 HISTORY — DX: Personal history of other medical treatment: Z92.89

## 2019-09-02 HISTORY — DX: Rheumatic mitral valve disease, unspecified: I05.9

## 2019-09-02 HISTORY — DX: Unspecified osteoarthritis, unspecified site: M19.90

## 2019-09-02 HISTORY — DX: Chronic diastolic (congestive) heart failure: I50.32

## 2019-09-02 LAB — CBC
HCT: 43.5 % (ref 39.0–52.0)
Hemoglobin: 14 g/dL (ref 13.0–17.0)
MCH: 30 pg (ref 26.0–34.0)
MCHC: 32.2 g/dL (ref 30.0–36.0)
MCV: 93.1 fL (ref 80.0–100.0)
Platelets: 184 10*3/uL (ref 150–400)
RBC: 4.67 MIL/uL (ref 4.22–5.81)
RDW: 15 % (ref 11.5–15.5)
WBC: 8.6 10*3/uL (ref 4.0–10.5)
nRBC: 0 % (ref 0.0–0.2)

## 2019-09-02 LAB — BASIC METABOLIC PANEL
Anion gap: 9 (ref 5–15)
BUN: 12 mg/dL (ref 8–23)
CO2: 26 mmol/L (ref 22–32)
Calcium: 9 mg/dL (ref 8.9–10.3)
Chloride: 101 mmol/L (ref 98–111)
Creatinine, Ser: 0.92 mg/dL (ref 0.61–1.24)
GFR calc Af Amer: >60 mL/min (ref >60)
GFR calc non Af Amer: >60 mL/min (ref >60)
Glucose, Bld: 130 mg/dL — ABNORMAL HIGH (ref 70–99)
Potassium: 4.5 mmol/L (ref 3.5–5.1)
Sodium: 136 mmol/L (ref 135–145)

## 2019-09-02 LAB — HEMOGLOBIN A1C
Hgb A1c MFr Bld: 7.1 % — ABNORMAL HIGH (ref 4.8–5.6)
Mean Plasma Glucose: 157.07 mg/dL

## 2019-09-02 LAB — GLUCOSE, CAPILLARY: Glucose-Capillary: 124 mg/dL — ABNORMAL HIGH (ref 70–99)

## 2019-09-02 NOTE — Anesthesia Preprocedure Evaluation (Addendum)
Anesthesia Evaluation  Patient identified by MRN, date of birth, ID band Patient awake    Reviewed: Allergy & Precautions, NPO status , Patient's Chart, lab work & pertinent test results, reviewed documented beta blocker date and time   Airway Mallampati: III  TM Distance: >3 FB Neck ROM: Full    Dental  (+) Poor Dentition, Missing, Chipped, Partial Upper   Pulmonary shortness of breath and with exertion,    Pulmonary exam normal breath sounds clear to auscultation       Cardiovascular hypertension, Pt. on medications and Pt. on home beta blockers +CHF  Normal cardiovascular exam+ Valvular Problems/Murmurs AS and MR  Rhythm:Irregular Rate:Normal  Echo 07/15/19 1. Left ventricular ejection fraction, by estimation, is 55%. The left ventricle has normal function. Left ventricular endocardial border not optimally defined to evaluate regional wall motion. Left ventricular diastolic parameters are indeterminate. Elevated left ventricular end-diastolic pressure. There is septal shudder with mild respiratory related septal shift. Study was not protocoled to assess for constrictive physiology.  2. Right ventricular systolic function is normal. The right ventricular size is normal. There is severely elevated pulmonary artery systolic pressure. The estimated right ventricular systolic pressure is 99991111 mmHg.  3. Left atrial size was severely dilated.  4. Right atrial size was severely dilated.  5. Calcification of the aortomitral continuity with severe aortic valve stenosis and moderate mitral valve stesnosis. Findings suggestive of radiation heart disease.  6. The mitral valve is abnormal. Mild to moderate mitral valve regurgitation. Moderate mitral stenosis. The mean mitral valve gradient is 6.0 mmHg with average heart rate of 74 bpm.  7. The aortic valve is abnormal. Aortic valve regurgitation is not visualized. Severe aortic valve stenosis.  Aortic valve mean gradient measures 44.0 mmHg.  8. Aortic dilatation noted. There is mild dilatation of the ascending aorta measuring 41 mm.  9. The inferior vena cava is dilated in size with <50% respiratory variability, suggesting right atrial pressure of 15 mmHg.   EKG 07/28/19 Atrial fibrillation, RBBB pattern  Cardiac Cath 08/12/19  The left ventricular systolic function is normal.  LV end diastolic pressure is normal.  The left ventricular ejection fraction is 55-65% by visual estimate.  There is severe aortic valve stenosis. Mean gradient 46 mm Hg.  There is mild mitral valve stenosis. Mitral valve area 2.1 cm2. Mean gradient 11 mm Hg.  Hemodynamic findings consistent with mild pulmonary hypertension.  No significant CAD.   Neuro/Psych negative neurological ROS  negative psych ROS   GI/Hepatic negative GI ROS, Neg liver ROS, Dental caries, non restorable teeth   Endo/Other  diabetes, Well Controlled, Type 2, Oral Hypoglycemic AgentsObesity  Hyperliidemia  Renal/GU negative Renal ROS  negative genitourinary   Musculoskeletal  (+) Arthritis , Osteoarthritis,  S/P amputation of right 4th and 5th fingers   Abdominal (+) + obese,   Peds  Hematology Coumadin therapy- last dose 5/28   Anesthesia Other Findings   Reproductive/Obstetrics                          Anesthesia Physical Anesthesia Plan  ASA: III  Anesthesia Plan: General   Post-op Pain Management:    Induction: Intravenous  PONV Risk Score and Plan: 2 and Ondansetron and Treatment may vary due to age or medical condition  Airway Management Planned: Nasal ETT  Additional Equipment: Arterial line  Intra-op Plan:   Post-operative Plan: Extubation in OR  Informed Consent: I have reviewed the patients History and Physical,  chart, labs and discussed the procedure including the risks, benefits and alternatives for the proposed anesthesia with the patient or authorized  representative who has indicated his/her understanding and acceptance.     Dental advisory given  Plan Discussed with: CRNA and Surgeon  Anesthesia Plan Comments: (See PAT note written by Myra Gianotti, PA-C. Has severe AS, mild-mod MR, mod MS, afib, DM2, HTN, non-Hodgkin's lymphoma (in remission).   Anesthesia Consult order does not mention request for nasotracheal tube, although I did discuss with patient that this is sometimes used; however, he reports left nares obstruction/narrowing since childhood and had intermittent epistaxis, right nares > left.  He still reports occasional blood tinged nasal mucous, but last significant nose bleed about one year ago. He denied obstructive symptoms with his right nares. Last warfarin 08/29/19. Definitive anesthesia plan on the day of surgery following evaluation. For PT/INR on the day of surgery.   Afrin 2 sprays each nostril.  )   Anesthesia Quick Evaluation

## 2019-09-02 NOTE — Progress Notes (Addendum)
Anesthesia PAT Evaluation:  Case: [id number] Date/Time: 09/04/19 0715   Procedure: MULTIPLE EXTRACTION WITH ALVEOLOPLASTY AND GROSS REMAINING TEETH (N/A )   Anesthesia type: General   Pre-op diagnosis: SEVERE AROTIC STENOSIS   Location: [facility] ROOM 08 / [facility]   Surgeons: Lenn Cal, DDS      DISCUSSION: Patient is a 79 year old male scheduled for the above procedure.  Patient with anticipated heart valve surgery for severe AS. Per dental notes, he need extractions of maxillary teeth as well as tooth numbers 20, 30, and 31 with alveoloplasty and gross debridement of remaining dentition in the operating room with general anesthesia. He has a upper partial denture.   Anesthesia consult requested to "assess ability to proceed with general anesthesia".  History includes never smoker, severe aortic stenosis, mitral valve disease (mild-moderate MR, moderate MS 07/15/19), chronic diastolic CHF (acute, [id number]), persistent afib (s/p DCCV 01/06/09), HTN, DM2, Non-Hodgkin's Lymphoma (diagnosed 04/2003, s/p chemotherapy), right finger amputations (4th, 5th). BMI is consistent with obesity. He was started on antibiotic ointment for left eye hordeolum (stye) per provider at [facility].  Patient evaluated at 09/02/19 PAT visit. He denied any acute CV/HF symptoms. Denied chest pain. He has stable DOE. Says he is a "mouth breather" who always sounds out of breath after activity. He has mild BLE edema which he feels is stable. No syncope. Lung sounds clear. Heart irregularly irregular. III/VI SEM. Mild BLE edema. Able to open mouth and move neck without difficulty.   Per Dr. Enrique Sack, last warfarin before surgery after 08/29/19 dose (which I confirmed with patient). He needs a preoperative PT/INR.   Consult order does not mention request for nasotracheal tube, although I did discuss with patient that this is sometimes used; however, he reports left nares obstruction/narrowing since childhood and had intermittent  epistaxis, right nares > left.  He still reports occasional blood tinged nasal mucous, but last significant nose bleed about one year ago. He denied obstructive symptoms with his right nares. Defer to anesthesiologist definitive anesthesia plan (ie, nasotracheal versus orotracheal ETT).     09/02/19 pre-surgical COVID-19 test negative. He is having his CT surgery evaluation with Dr. Cyndia Bent on 09/03/19.    VS: BP (!) 116/58   Pulse 72   Temp 36.5 C (Oral)   Resp 20   Ht 5' 10.5" (1.791 m)   Wt 117.9 kg   SpO2 100%   BMI 36.76 kg/m     PROVIDERS: Kathyrn Lass, MD is PCP  - Larae Grooms, MD is primary Cardiologist - Lauree Chandler, MD is Structural Heart Cardiologist (for TAVR evaluation) - Gilford Raid, MD is CT surgeon (for TAVR versus AVR evaluation). Appointment scheduled for 09/03/19.  Betsy Coder, MD is HEM-ONC. Last visit 05/06/13. He was In clinical remission of non-hodgkin's lymphoma at that time with "a good prognosis for a long-term disease-free survival." He was discharged from the oncology clinic with on-going primary care follow-up.     LABS: Labs reviewed: Acceptable for surgery. Needs PT/INR on the day of surgery.  (all labs ordered are listed, but only abnormal results are displayed)  Labs Reviewed  GLUCOSE, CAPILLARY - Abnormal; Notable for the following components:      Result Value   Glucose-Capillary 124 (*)    All other components within normal limits  BASIC METABOLIC PANEL - Abnormal; Notable for the following components:   Glucose, Bld 130 (*)    All other components within normal limits  HEMOGLOBIN A1C - Abnormal; Notable  for the following components:   Hgb A1c MFr Bld 7.1 (*)    All other components within normal limits  CBC     IMAGES: CTA chest/abd/pelvis 08/21/19 (ordered by Dr. Angelena Form): IMPRESSION: 1. Vascular findings and measurements pertinent to potential TAVR procedure, as detailed. 2. Diffuse thickening and coarse  calcification of the aortic valve, compatible with the reported history of severe symptomatic aortic stenosis. 3. Low-attenuation 1.3 cm focus in the pancreatic body. No biliary or pancreatic duct dilation. MRI abdomen without and with IV contrast recommended for further characterization. 4. Mild cardiomegaly. 5. Stable right adrenal adenoma. 6. Mild sigmoid diverticulosis. 7. Mild prostatomegaly. 8. Aortic Atherosclerosis (ICD10-I70.0).   EKG: 07/28/19 ([facility]): Afib @ 85 bpm, right BBB   CV: CT Coronary 08/21/19: IMPRESSION: 1. Trileaflet aortic valve with severe calcifications (calcium score 6709) 2. Aortic annulus measures 75mm x 95mm with perimeter 85mm and area 550 mm^2. There is moderate annular calcification adjacent to left coronary cusp, which extends into LVOT. Annular measurements suitable for delivery of 78mm Edwards-Sapient 3 valve 2. Sufficient coronary to annulus distance, measuring 23mm to left main and 29mm to RCA 3.  Optimum Fluoroscopic Angle for Delivery: LAO 7 CRA 4 4.  Coronary calcium score 2772 (90th percentile)   Carotid US 08/21/19: Summary:  Right Carotid: Velocities in the right ICA are consistent with a 1-39%  stenosis.  Left Carotid: Velocities in the left ICA are consistent with a 1-39%  stenosis.  Vertebrals: Bilateral vertebral arteries demonstrate antegrade flow.    Cardiac cath 08/12/19:  The left ventricular systolic function is normal.  LV end diastolic pressure is normal.  The left ventricular ejection fraction is 55-65% by visual estimate.  There is severe aortic valve stenosis. Mean gradient 46 mm Hg.  There is mild mitral valve stenosis. Mitral valve area 2.1 cm2. Mean gradient 11 mm Hg.  Hemodynamic findings consistent with mild pulmonary hypertension.  No significant CAD. No significant CAD.  Plan for TAVR w/u.     Echo 07/15/19: IMPRESSIONS  1. Left ventricular ejection fraction, by estimation, is 55%. The  left  ventricle has normal function. Left ventricular endocardial border not  optimally defined to evaluate regional wall motion. Left ventricular  diastolic parameters are indeterminate.  Elevated left ventricular end-diastolic pressure. There is septal shudder  with mild respiratory related septal shift. Study was not protocoled to  assess for constrictive physiology.  2. Right ventricular systolic function is normal. The right ventricular  size is normal. There is severely elevated pulmonary artery systolic  pressure. The estimated right ventricular systolic pressure is 99991111 mmHg.  3. Left atrial size was severely dilated.  4. Right atrial size was severely dilated.  5. Calcification of the aortomitral continuity with severe aortic valve  stenosis and moderate mitral valve stesnosis. Findings suggestive of  radiation heart disease.  6. The mitral valve is abnormal. Mild to moderate mitral valve  regurgitation. Moderate mitral stenosis. The mean mitral valve gradient is  6.0 mmHg with average heart rate of 74 bpm.  7. The aortic valve is abnormal. Aortic valve regurgitation is not  visualized. Severe aortic valve stenosis. Aortic valve mean gradient  measures 44.0 mmHg.  AV Area (Vmax):  0.68 cm  AV Area (Vmean):  0.64 cm  AV Area (VTI):   0.69 cm  AV Vmax:      432.00 cm/s  AV Vmean:     303.000 cm/s  AV VTI:      1.100 m  AV Peak Grad:  74.6 mmHg  AV Mean Grad:   44.0 mmHg  LVOT Vmax:     70.20 cm/s  LVOT Vmean:    46.800 cm/s  LVOT VTI:     0.182 m  LVOT/AV VTI ratio: 0.17  8. Aortic dilatation noted. There is mild dilatation of the ascending  aorta measuring 41 mm.  9. The inferior vena cava is dilated in size with <50% respiratory  variability, suggesting right atrial pressure of 15 mmHg.  - Comparison(s): A prior study was performed on 05/30/2018. Comparison made  by report only. No significant change in biventricular  function, aortic  stenosis or mitral valve stenosis.    Past Medical History:  Diagnosis Date  . 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 Aortic stenosis   . Arthritis   . Atrial fibrillation ([place])   . Cancer ([place])   . Chronic diastolic CHF (congestive heart failure) ([place])   . Diabetes mellitus    Type II  . Dysrhythmia    Afib  . History of blood transfusion    5 pints for peptic ulcer  . History of kidney stones    Passed x 2  . Hypertension   . Mitral valve disease    mild-moderate MR, moderate MS 07/15/19 echo  . 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 Ruptured disk   . Ulcer    peptic Ulcer - bleeding    Past Surgical History:  Procedure Laterality Date  . APPENDECTOMY  1967  . COLONOSCOPY    . left knee surgery    . left shoulder surgery    . RIGHT/LEFT HEART CATH AND CORONARY ANGIOGRAPHY N/A 08/12/2019   Procedure: RIGHT/LEFT HEART CATH AND CORONARY ANGIOGRAPHY;  Surgeon: Jettie Booze, MD;  Location: [facility];  Service: Cardiovascular;  Laterality: N/A;  . TONSILLECTOMY      MEDICATIONS: . acetaminophen (TYLENOL) 650 MG CR tablet  . erythromycin ophthalmic ointment  . atorvastatin (LIPITOR) 20 MG tablet  . diltiazem (CARDIZEM CD) 120 MG 24 hr capsule  . lisinopril (PRINIVIL,ZESTRIL) 10 MG tablet  . metFORMIN (GLUCOPHAGE-XR) 500 MG 24 hr tablet  . metoprolol succinate (TOPROL-XL) 100 MG 24 hr tablet  . Multiple Vitamins-Minerals (PRESERVISION AREDS 2 PO)  . ONE TOUCH ULTRA TEST test strip  . ONETOUCH DELICA LANCETS [id number] MISC  . pioglitazone (ACTOS) 15 MG tablet  . warfarin (COUMADIN) 6 MG tablet   No current facility-administered medications for this encounter.    Myra Gianotti, PA-C Surgical Short Stay/Anesthesiology [facility] Phone [phone] [facility] Phone [phone] 09/03/2019 9:26 AM

## 2019-09-02 NOTE — Progress Notes (Addendum)
PCP - Dr. Sabra Heck  Cardiologist - Dr Irish Lack  Chest x-ray - no  EKG - 07/28/2019  Stress Test - no  ECHO - 07/15/2019  Cardiac Cath - 08/12/2019  Sleep Study - no CPAP - no  LABS- CBC, BMP, PT, A1C  ASA-no  Coumandin last day 08/29/2019  ERAS-no  HA1C-7.1 - 09/02/2019 Fasting Blood Sugar - 140- 180 Checks Blood Sugar _1____ times a day  Anesthesia-  Pt denies having chest pain, sob, or fever at this time. All instructions explained to the pt, with a verbal understanding of the material. Pt agrees to go over the instructions while at home for a better understanding. Pt also instructed to self quarantine after being tested for COVID-19. The opportunity to ask questions was provided.

## 2019-09-02 NOTE — Progress Notes (Signed)
Your procedure is scheduled on Thursday, 09/04/2019.  Report to [facility] Main Entrance "A" at 05:30 A.M., and check in at the Admitting office.  Your surgery or procedure is scheduled for 7:30 AM   Call this number if you have problems the morning of surgery:  [phone]  Call [phone] if you have any questions prior to your surgery date Monday-Friday 8am-4pm   Remember:  Do not eat or drink after midnight the night before your surgery    Take these medicines the morning of surgery with A SIP OF WATER: Metoprolol succinate (Toprol-XL)   As of today, STOP taking any Aspirin (unless otherwise instructed by your surgeon) and Aspirin containing products, Aleve, Naproxen, Ibuprofen, Motrin, Advil, Goody's, BC's, all herbal medications, fish oil, and all vitamins.  Follow your surgeon's instructions on when to stop Warfarin (Coumadin).  If no instructions were given by your surgeon then you will need to call the office to get those instructions.    WHAT DO I DO ABOUT MY DIABETES MEDICATION?  Marland Kitchen Do not take oral diabetes medicines (pills) the morning of surgery. - do NOT take Metformin (Glucophage-XR) or Pioglitazone (Actos) the morning of surgery.   HOW TO MANAGE YOUR DIABETES BEFORE AND AFTER SURGERY  Why is it important to control my blood sugar before and after surgery? . Improving blood sugar levels before and after surgery helps healing and can limit problems. . A way of improving blood sugar control is eating a healthy diet by: o  Eating less sugar and carbohydrates o  Increasing activity/exercise o  Talking with your doctor about reaching your blood sugar goals . High blood sugars (greater than 180 mg/dL) can raise your risk of infections and slow your recovery, so you will need to focus on controlling your diabetes during the weeks before surgery. . Make sure that the doctor who takes care of your diabetes knows about your planned surgery including the date and  location.  How do I manage my blood sugar before surgery? . Check your blood sugar at least 4 times a day, starting 2 days before surgery, to make sure that the level is not too high or low. . Check your blood sugar the morning of your surgery when you wake up and every 2 hours until you get to the Short Stay unit. o If your blood sugar is less than 70 mg/dL, you will need to treat for low blood sugar: - Do not take insulin. - Treat a low blood sugar (less than 70 mg/dL) with  cup of clear juice (cranberry or apple), 4 glucose tablets, OR glucose gel. - Recheck blood sugar in 15 minutes after treatment (to make sure it is greater than 70 mg/dL). If your blood sugar is not greater than 70 mg/dL on recheck, call [phone] for further instructions. . Report your blood sugar to the short stay nurse when you get to Short Stay.  . If you are admitted to the hospital after surgery: o Your blood sugar will be checked by the staff and you will probably be given insulin after surgery (instead of oral diabetes medicines) to make sure you have good blood sugar levels. o The goal for blood sugar control after surgery is 80-180 mg/dL.   Special instructions:   [place]- Preparing For Surgery  Before surgery, you can play an important role. Because skin is not sterile, your skin needs to be as free of germs as possible. You can reduce the number of germs on  your skin by washing with CHG (chlorahexidine gluconate) Soap before surgery.  CHG is an antiseptic cleaner which kills germs and bonds with the skin to continue killing germs even after washing.    Oral Hygiene is also important to reduce your risk of infection.  Remember - BRUSH YOUR TEETH THE MORNING OF SURGERY WITH YOUR REGULAR TOOTHPASTE  Please do not use if you have an allergy to CHG or antibacterial soaps. If your skin becomes reddened/irritated stop using the CHG.  Do not shave (including legs and underarms) for at least 48 hours prior to  first CHG shower. It is OK to shave your face.  Please follow these instructions carefully.   1. Shower the NIGHT BEFORE SURGERY and the MORNING OF SURGERY with CHG Soap.   2. If you chose to wash your hair, wash your hair first as usual with your normal shampoo.  3. After you shampoo, wash your face and private area with the soap you use at home, then rinse your hair and body thoroughly to remove the shampoo and soap.  4. Use CHG as you would any other liquid soap. You can apply CHG directly to the skin and wash gently with a scrungie or a clean washcloth.   5. Apply the CHG Soap to your body ONLY FROM THE NECK DOWN.  Do not use on open wounds or open sores. Avoid contact with your eyes, ears, mouth and genitals (private parts).   6. Wash thoroughly, paying special attention to the area where your surgery will be performed.  7. Thoroughly rinse your body with warm water from the neck down.  8. DO NOT shower/wash with your normal soap after using and rinsing off the CHG Soap.  9. Pat yourself dry with a CLEAN TOWEL.  10. Wear CLEAN PAJAMAS to bed the night before surgery, wear comfortable clothes the morning of surgery  11. Place CLEAN SHEETS on your bed the night of your first shower and DO NOT SLEEP WITH PETS.  Day of Surgery: Shower as instructed above.  Do not wear lotions, powders, colognes, or deodorant. Please wear clean clothes to the hospital/surgery center.   Remember to brush your teeth WITH YOUR REGULAR TOOTHPASTE.             Do not wear jewelry            Men may shave face and neck.            Do not bring valuables to the hospital.            [facility] is not responsible for any belongings or valuables.  Do NOT Smoke (Tobacco/Vapping) or drink Alcohol 24 hours prior to your procedure  If you use a CPAP at night, you may bring all equipment for your overnight stay.   Contacts, glasses, dentures or bridgework may not be worn into surgery.      For patients  admitted to the hospital, discharge time will be determined by your treatment team.   Patients discharged the day of surgery will not be allowed to drive home, and someone needs to stay with them for 24 hours. Please read over the following fact sheets that you were given.

## 2019-09-03 ENCOUNTER — Encounter: Payer: Self-pay | Admitting: Physical Therapy

## 2019-09-03 ENCOUNTER — Institutional Professional Consult (permissible substitution): Payer: PPO | Admitting: Surgery

## 2019-09-03 ENCOUNTER — Encounter: Payer: Self-pay | Admitting: Surgery

## 2019-09-03 ENCOUNTER — Ambulatory Visit: Payer: PPO | Attending: Cardiovascular Disease | Admitting: Physical Therapy

## 2019-09-03 ENCOUNTER — Other Ambulatory Visit: Payer: Self-pay

## 2019-09-03 ENCOUNTER — Encounter (HOSPITAL_COMMUNITY): Payer: Self-pay | Admitting: Dentistry

## 2019-09-03 VITALS — BP 121/80 | HR 67 | Temp 97.6°F | Resp 20 | Ht 70.5 in | Wt 259.0 lb

## 2019-09-03 DIAGNOSIS — R2689 Other abnormalities of gait and mobility: Secondary | ICD-10-CM

## 2019-09-03 DIAGNOSIS — I350 Nonrheumatic aortic (valve) stenosis: Secondary | ICD-10-CM | POA: Diagnosis not present

## 2019-09-03 LAB — SARS CORONAVIRUS 2 (TAT 6-24 HRS): SARS Coronavirus 2: NEGATIVE

## 2019-09-03 MED ORDER — DEXTROSE 5 % IV SOLN
3.0000 g | INTRAVENOUS | Status: AC
  Administered 2019-09-04: 3 g via INTRAVENOUS
  Filled 2019-09-03: qty 3

## 2019-09-03 NOTE — Therapy (Signed)
[place], [place], [address] Phone: [phone]   Fax:  [phone]  Physical Therapy Evaluation  Patient Details  Name: Gregory [name] MRN: [id number] Date of Birth: 08-14-1940 Referring Provider (PT): Lauree Chandler, MD   Encounter Date: 09/03/2019  PT End of Session - 09/03/19 1302    Visit Number  1    Number of Visits  1    Date for PT Re-Evaluation  --   NA-1 time visit only   Authorization Type  [employer]    PT Start Time  1102    PT Stop Time  1144    PT Time Calculation (min)  42 min    Equipment Utilized During Treatment  Gait belt    Activity Tolerance  Patient limited by fatigue;Patient limited by pain    Behavior During Therapy  [facility] for tasks assessed/performed       Past Medical History:  Diagnosis Date  . 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 Arthritis   . Cancer ([place])   . Chronic atrial fibrillation ([place])   . Chronic diastolic CHF (congestive heart failure) ([place])   . Diabetes mellitus    Type II  . History of blood transfusion    5 pints for peptic ulcer  . History of kidney stones    Passed x 2  . Hypertension   . Mitral valve disease    mild-moderate MR, moderate MS 07/15/19 echo  . 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 Ruptured disk   . Severe aortic stenosis   . Ulcer    peptic Ulcer - bleeding    Past Surgical History:  Procedure Laterality Date  . APPENDECTOMY  1967  . COLONOSCOPY    . left knee surgery    . left shoulder surgery    . RIGHT/LEFT HEART CATH AND CORONARY ANGIOGRAPHY N/A 08/12/2019   Procedure: RIGHT/LEFT HEART CATH AND CORONARY ANGIOGRAPHY;  Surgeon: Jettie Booze, MD;  Location: [facility];  Service: Cardiovascular;  Laterality: N/A;  . TONSILLECTOMY      There were no vitals filed for this visit.   Subjective Assessment -  09/03/19 1106    Subjective  Pt. is a 79 y/o male referred to PT for pre-TAVR surgical assessment. He reports approximately 1 year history of issues with worsening fatigue on exertion with decreased activity tolerance, SOB.    Pertinent History  see PMH-history Non-Hodgkin's lymphoma, CHF, HTN, DM2, right>left knee OA, finger amputation right 4th and 5th digit    Limitations  Standing;Walking    Patient Stated Goals  Get heart better    Currently in Pain?  Yes    Pain Location  Knee    Pain Orientation  Right    Pain Descriptors / Indicators  Aching    Pain Type  Chronic pain    Pain Onset  More than a month ago    Pain Frequency  Intermittent    Aggravating Factors   standing and walking    Pain Relieving Factors  rest         [facility] PT Assessment - 09/03/19 0001      Assessment   Medical Diagnosis  Severe aortic stenosis    Referring Provider (PT)  Lauree Chandler, MD    Onset Date/Surgical Date  09/03/18   estimated per subjective report 1 year symptom history  Hand Dominance  Right      Precautions   Precautions  None      Restrictions   Weight Bearing Restrictions  No      Balance Screen   Has the patient fallen in the past 6 months  Yes    How many times?  [place] to enter    Entrance Stairs-Number of Steps  2    Entrance Stairs-Rails  None    Home Layout  One level    Home Equipment  Crutches      Prior Function   Level of Independence  Independent with community mobility without device      Cognition   Overall Cognitive Status  Within Functional Limits for tasks assessed      Posture/Postural Control   Posture/Postural Control  Postural limitations    Postural Limitations  Rounded Shoulders;Increased thoracic kyphosis      ROM / Strength   AROM / PROM / Strength  AROM;Strength      AROM   Overall AROM Comments   see knee AROM below, bilat. LE AROM otherwise grossly WFL. Bilateral shoulder flexion and abduction limited to 130 deg otherwise bilat. UE AROM grossly WFL    AROM Assessment Site  Knee    Right/Left Knee  Right;Left    Right Knee Extension  8   lacking 8 deg from neutral   Right Knee Flexion  103    Left Knee Extension  4   lacking 4 deg from neutral   Left Knee Flexion  112      Strength   Overall Strength Comments  Right grip 30 lbs., left grip 40 lbs. with grip dynamonometer    Strength Assessment Site  Shoulder;Elbow;Hip;Knee;Ankle    Right/Left Shoulder  Right;Left    Right Shoulder Flexion  5/5    Right Shoulder ABduction  4/5    Right Shoulder Internal Rotation  5/5    Right Shoulder External Rotation  5/5    Left Shoulder Flexion  5/5    Left Shoulder ABduction  4/5    Left Shoulder Internal Rotation  4+/5    Left Shoulder External Rotation  4+/5    Right/Left Elbow  Right;Left    Right Elbow Flexion  5/5    Right Elbow Extension  5/5    Left Elbow Flexion  5/5    Left Elbow Extension  5/5    Right/Left Hip  Right;Left    Right Hip Flexion  4/5    Right Hip Extension  4/5    Right Hip External Rotation   4/5    Right Hip Internal Rotation  4/5    Right Hip ABduction  4/5    Left Hip Flexion  4/5    Left Hip Extension  4/5    Left Hip External Rotation  4/5    Left Hip Internal Rotation  4/5    Left Hip ABduction  4/5    Right/Left Knee  Right;Left    Right Knee Flexion  5/5    Right Knee Extension  4+/5    Left Knee Flexion  5/5    Left Knee Extension  4+/5    Right/Left Ankle  Right;Left    Right Ankle Dorsiflexion  5/5    Right Ankle Inversion  5/5  Right Ankle Eversion  5/5    Left Ankle Dorsiflexion  5/5    Left Ankle Inversion  5/5    Left Ankle Eversion  5/5      Ambulation/Gait   Gait Comments  Pt. ambulates with wide BOS with antalgia worse on fatigue with decreased heel strike on left with tendency foot slap when fatigued, tendency to walk with  hips externally rotated with mild "steppage" pattern       [facility] Pre-Surgical Assessment - 09/03/19 0001    5 Meter Walk Test- trial 1  8 sec    5 Meter Walk Test- trial 2  8 sec.     5 Meter Walk Test- trial 3  8 sec.    5 meter walk test average  8 sec    4 Stage Balance Test tolerated for:   10 sec.    4 Stage Balance Test Position  3    comment  able to complete 10 sec each for first 3 positions but unable to perform SLS    Sit To Stand Test- trial 1  20 sec.    ADL/IADL Independent with:  Bathing;Dressing;Meal prep;Finances;Yard work    6 Minute Walk- Baseline  yes    BP (mmHg)  128/63    HR (bpm)  73    02 Sat (%RA)  92 %    Modified Borg Scale for Dyspnea  2- Mild shortness of breath    Perceived Rate of Exertion (Borg)  9- very light    6 Minute Walk Post Test  yes    BP (mmHg)  (!) 144/118   decreased to 136/90 with brief seated rest   HR (bpm)  122    02 Sat (%RA)  95 %    Modified Borg Scale for Dyspnea  3- Moderate shortness of breath or breathing difficulty    Perceived Rate of Exertion (Borg)  13- Somewhat hard    Aerobic Endurance Distance Walked  648    Endurance additional comments  1 standing rest after 2 min 40 sec lasting <10 sec                Objective measurements completed on examination: See above findings.              PT Education - 09/03/19 1301    Education Details  POC, tesing procedure    Person(s) Educated  Patient    Methods  Explanation    Comprehension  Verbalized understanding                  Plan - 09/03/19 1302    Clinical Impression Statement  see assessment in note    Personal Factors and Comorbidities  Age;Comorbidity 3+    Comorbidities  see PMH    Examination-Activity Limitations  Locomotion Level;Stand;Squat    Examination-Participation Restrictions  Community Activity;Shop;Cleaning    Stability/Clinical Decision Making  Stable/Uncomplicated    Clinical Decision Making  Low    Rehab Potential   Good    PT Frequency  One time visit    PT Duration  --   NA   PT Next Visit Plan  NA    PT Home Exercise Plan  NA       Patient demonstrated the following deficits and impairments:  Abnormal gait, Decreased endurance, Difficulty walking, Pain, Decreased strength, Decreased activity tolerance, Cardiopulmonary status limiting activity  Visit Diagnosis: Other abnormalities of gait and mobility - Plan: PT plan of care cert/re-cert  Problem List Patient Active Problem List   Diagnosis Date Noted  . Acute diastolic heart failure ([place]) 06/13/2015  . Atrial fibrillation with RVR ([place]) 06/13/2015  . Dyspnea 06/13/2015  . Type 2 diabetes mellitus with hyperlipidemia ([place]) 06/13/2015  . Severe aortic stenosis 05/25/2014  . Atrial fibrillation ([place]) 08/26/2013  . Mitral valve disorder 08/26/2013  . Mixed hyperlipidemia 08/26/2013  . Obesity 08/26/2013  . Essential hypertension, benign 08/26/2013  . Other malignant lymphomas, unspecified site, extranodal and solid organ sites 05/06/2012    Beaulah Dinning, PT, DPT 09/03/19 3:08 PM  [facility] [address] [place], [place], [address] Phone: [phone]   Fax:  [phone]  Name: Gregory [name] MRN: [id number] Date of Birth: 12-Jun-1940

## 2019-09-03 NOTE — Progress Notes (Signed)
Patient ID: Gregory [name], male   DOB: 08/06/1940, 79 y.o.   MRN: [id number]  [facility]SULTATION REPORT  Referring Provider is Jettie Booze, MD Primary Cardiologist is Larae Grooms, MD PCP is Kathyrn Lass, MD  Chief Complaint  Patient presents with  . Aortic Stenosis    Surgical eval for TAVR, review all studies/testing    HPI:  The patient is a 79 year old gentleman with a history of atrial fibrillation on Coumadin, chronic diastolic congestive heart failure mitral regurgitation, diabetes, hypertension, non-Hodgkin's lymphoma, and severe aortic stenosis who is being evaluated for aortic valve replacement.  He had an echocardiogram in 2017 that showed a mean gradient of 28 mmHg and a repeat echo in February 2020 which showed an increase in the mean gradient to 45 mmHg.  His left ventricular ejection fraction had decreased to 55 to 60% on his echocardiogram in February 2020.  His most recent echocardiogram on 07/15/2019 showed an aortic valve mean gradient of 44 mmHg.  Aortic valve area was measured at 0.69 cm and dimensionless index was 0.17.  Left ventricular ejection fraction was unchanged at 55%.  He subsequently underwent cardiac catheterization this month showing no significant coronary disease.  He has noted some exertional fatigue and shortness of breath but is limited in his activity due to his severe right knee arthritis with bone-on-bone.  He denies any chest pain or pressure.  He has had no orthopnea.  He denies peripheral edema.  He is scheduled to have some teeth removed tomorrow by Dr. Enrique Sack. He lives in [place] alone. He is a retired Administrator  Past Medical History:  Diagnosis Date  . 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 Aortic stenosis   . Arthritis   . Atrial fibrillation ([place])   . Cancer ([place])   . Chronic diastolic CHF (congestive heart  failure) ([place])   . Diabetes mellitus    Type II  . Dysrhythmia    Afib  . History of blood transfusion    5 pints for peptic ulcer  . History of kidney stones    Passed x 2  . Hypertension   . Mitral valve disease    mild-moderate MR, moderate MS 07/15/19 echo  . 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 Ruptured disk   . Ulcer    peptic Ulcer - bleeding    Past Surgical History:  Procedure Laterality Date  . APPENDECTOMY  1967  . COLONOSCOPY    . left knee surgery    . left shoulder surgery    . RIGHT/LEFT HEART CATH AND CORONARY ANGIOGRAPHY N/A 08/12/2019   Procedure: RIGHT/LEFT HEART CATH AND CORONARY ANGIOGRAPHY;  Surgeon: Jettie Booze, MD;  Location: [facility];  Service: Cardiovascular;  Laterality: N/A;  . TONSILLECTOMY      Family History  Problem Relation Age of Onset  . Hypertension Father   . Heart attack Father   . Heart attack Mother   . Cancer Mother        LUNG  . Stroke Neg Hx     Social History   Socioeconomic History  . Marital status: Single    Spouse name: Not on file  . Number of children: 0  . Years of education: Not on file  . Highest education level: Not on file  Occupational History  .  Occupation: Retired-Trucking  Tobacco Use  . Smoking status: Never Smoker  . Smokeless tobacco: Never Used  Substance and Sexual Activity  . Alcohol use: No  . Drug use: No  . Sexual activity: Never  Other Topics Concern  . Not on file  Social History Narrative  . Not on file   Social Determinants of Health   Financial Resource Strain:   . Difficulty of Paying Living Expenses:   Food Insecurity:   . Worried About Charity fundraiser in the Last Year:   . Arboriculturist in the Last Year:   Transportation Needs:   . Film/video editor (Medical):   Marland Kitchen Lack of Transportation (Non-Medical):   Physical Activity:   . Days of Exercise per Week:   . Minutes of  Exercise per Session:   Stress:   . Feeling of Stress :   Social Connections:   . Frequency of Communication with Friends and Family:   . Frequency of Social Gatherings with Friends and Family:   . Attends Religious Services:   . Active Member of Clubs or Organizations:   . Attends Archivist Meetings:   Marland Kitchen Marital Status:   Intimate Partner Violence:   . Fear of Current or Ex-Partner:   . Emotionally Abused:   Marland Kitchen Physically Abused:   . Sexually Abused:     Current Outpatient Medications  Medication Sig Dispense Refill  . acetaminophen (TYLENOL) 650 MG CR tablet Take 1,300 mg by mouth every 8 (eight) hours as needed for pain.    Marland Kitchen atorvastatin (LIPITOR) 20 MG tablet Take 20 mg by mouth daily at 6 PM.     . diltiazem (CARDIZEM CD) 120 MG 24 hr capsule Take 120 mg by mouth daily at 6 PM.    . erythromycin ophthalmic ointment Place 1 application into the left eye 3 (three) times daily. Place ointment to the lower area of the    . lisinopril (PRINIVIL,ZESTRIL) 10 MG tablet Take 10 mg by mouth daily.    . metFORMIN (GLUCOPHAGE-XR) 500 MG 24 hr tablet Take 1 tablet (500 mg total) by mouth 2 (two) times daily. (Patient taking differently: Take 1,000 mg by mouth 2 (two) times daily. )    . metoprolol succinate (TOPROL-XL) 100 MG 24 hr tablet Take 100 mg by mouth daily. Take with or immediately following a meal.    . Multiple Vitamins-Minerals (PRESERVISION AREDS 2 PO) Take 1 tablet by mouth daily.    . ONE TOUCH ULTRA TEST test strip 1 each by Other route as needed (BLOOD SUGAR).     Glory Rosebush DELICA LANCETS [id number] MISC Apply 1 each topically daily as needed (BLOOD SUGAR).     Marland Kitchen pioglitazone (ACTOS) 15 MG tablet Take 15 mg by mouth daily.     Marland Kitchen warfarin (COUMADIN) 6 MG tablet Take 3-6 mg by mouth See admin instructions. Take 0.5 tablet (3 mg) by mouth on Thursdays & Saturdays & take 1 tablet (6 mg) by mouth on Sundays, Mondays, Tuesdays, Wednesdays, & Fridays at night.     No current  facility-administered medications for this visit.   Facility-Administered Medications Ordered in Other Visits  Medication Dose Route Frequency Provider Last Rate Last Admin  . [START ON 09/04/2019] ceFAZolin (ANCEF) 3 g in dextrose 5 % 50 mL IVPB  3 g Intravenous To [facility] Lenn Cal, DDS        Allergies  Allergen Reactions  . Macrobid [Nitrofurantoin]   . Nystatin   .  Prevnar 13 [Pneumococcal 13-Val Conj Vacc]   . Bisoprolol-Hydrochlorothiazide Other (See Comments)    "Severe" arthralgias and myalgias  . Nsaids Other (See Comments)    GI upset, Peptic ulcer disease      Review of Systems:   General:  Normal appetite, + decreased energy, no weight gain, no weight loss, no fever  Cardiac:  no chest pain with exertion, no chest pain at rest, +SOB with  exertion, no resting SOB, no PND, no orthopnea, no palpitations, no arrhythmia, no atrial fibrillation, no LE edema, no dizzy spells, no syncope  Respiratory:  + exertional shortness of breath, no home oxygen, no productive cough, no dry cough, no bronchitis, no wheezing, no hemoptysis, no asthma, no pain with inspiration or cough, no sleep apnea, no CPAP at night  GI:   no difficulty swallowing, no reflux, no frequent heartburn, no hiatal hernia, no abdominal pain, no constipation, no diarrhea, no hematochezia, no hematemesis, no melena  GU:   no dysuria,  no frequency, no urinary tract infection, no hematuria, no enlarged prostate, no kidney stones, no kidney disease  Vascular:  no pain suggestive of claudication, no pain in feet, no leg cramps, no varicose veins, no DVT, no non-healing foot ulcer  Neuro:   no stroke, no TIA's, no seizures, no headaches, no temporary blindness one eye,  no slurred speech, no peripheral neuropathy, no chronic pain, no instability of gait, no memory/cognitive dysfunction  Musculoskeletal: + arthritis, + joint swelling right knee, no myalgias, + difficulty walking due to right knee, Normal mobility    Skin:   no rash, no itching, no skin infections, no pressure sores or ulcerations  Psych:   no anxiety, no depression, no nervousness, no unusual recent stress  Eyes:   no blurry vision, no floaters, no recent vision changes, does not wear glasses or contacts  ENT:   no hearing loss, + loose or painful teeth, + partial dentures Hematologic:  no easy bruising, no abnormal bleeding, no clotting disorder, no frequent epistaxis  Endocrine:  + diabetes, does check CBG's at home      Physical Exam:   BP 121/80   Pulse 67   Temp 97.6 F (36.4 C) (Skin)   Resp 20   Ht 5' 10.5" (1.791 m)   Wt 259 lb (117.5 kg)   SpO2 95% Comment: RA  BMI 36.64 kg/m   General:  Large frame,  well-appearing, looks younger than his age.  HEENT:  Unremarkable, NCAT, PERLA, EOMI, multiple missing and broken teeth in maxilla  Neck:   no JVD, no bruits, no adenopathy   Chest:   clear to auscultation, symmetrical breath sounds, no wheezes, no rhonchi   CV:   RRR, grade lll/VI crescendo/decrescendo murmur heard best at RSB,  no diastolic murmur  Abdomen:  soft, non-tender  Extremities:  warm, well-perfused, pulses palpable at ankles, no LE edema  Rectal/GU  Deferred  Neuro:   Grossly non-focal and symmetrical throughout  Skin:   Clean and dry, no rashes, no breakdown   Diagnostic Tests:  Patient Name:  KAMARII MONIGOLD Date of Exam: 07/15/2019  Medical Rec #: [id number]   Height:    70.5 in  Accession #:  [id number]  Weight:    267.0 lb  Date of Birth: 1940/07/22  BSA:     2.373 m  Patient Age:  73 years   BP:      118/70 mmHg  Patient Gender: M       HR:  54 bpm.  Exam Location: [facility]   Procedure: 2D Echo, 3D Echo, Cardiac Doppler and Color Doppler                  MODIFIED REPORT:   This report was modified by Cherlynn Kaiser MD on 07/15/2019 due to  report                   revision.  Indications:   I35.0  Aortic Stenosis    History:     Patient has prior history of Echocardiogram examinations,  most          recent 05/30/2018. Aortic Valve Disease and Mitral Valve          Disease, Arrythmias:RBBB and Atrial Fibrillation; Risk          Factors:Family History of Coronary Artery Disease,  Hypertension          and Diabetes. History of Lymphoma, Severe Aortic Stenosis          (prior Mean 66mmHG).    Sonographer:   Deliah Boston RDCS  Referring Phys: [place]  Diagnosing Phys: Cherlynn Kaiser MD     Sonographer Comments: Technically difficult study due to poor echo  windows.  IMPRESSIONS    1. Left ventricular ejection fraction, by estimation, is 55%. The left  ventricle has normal function. Left ventricular endocardial border not  optimally defined to evaluate regional wall motion. Left ventricular  diastolic parameters are indeterminate.  Elevated left ventricular end-diastolic pressure. There is septal shudder  with mild respiratory related septal shift. Study was not protocoled to  assess for constrictive physiology.  2. Right ventricular systolic function is normal. The right ventricular  size is normal. There is severely elevated pulmonary artery systolic  pressure. The estimated right ventricular systolic pressure is 99991111 mmHg.  3. Left atrial size was severely dilated.  4. Right atrial size was severely dilated.  5. Calcification of the aortomitral continuity with severe aortic valve  stenosis and moderate mitral valve stesnosis. Findings suggestive of  radiation heart disease.  6. The mitral valve is abnormal. Mild to moderate mitral valve  regurgitation. Moderate mitral stenosis. The mean mitral valve gradient is  6.0 mmHg with average heart rate of 74 bpm.  7. The aortic valve is abnormal. Aortic valve regurgitation is not  visualized. Severe aortic valve stenosis. Aortic valve mean gradient   measures 44.0 mmHg.  8. Aortic dilatation noted. There is mild dilatation of the ascending  aorta measuring 41 mm.  9. The inferior vena cava is dilated in size with <50% respiratory  variability, suggesting right atrial pressure of 15 mmHg.   Comparison(s): A prior study was performed on 05/30/2018. Comparison made  by report only. No significant change in biventricular function, aortic  stenosis or mitral valve stenosis.   FINDINGS  Left Ventricle: Left ventricular ejection fraction, by estimation, is  55%. The left ventricle has normal function. Left ventricular endocardial  border not optimally defined to evaluate regional wall motion. The left  ventricular internal cavity size was  normal in size. There is no left ventricular hypertrophy. Septal shudder  with mild respiratory related septal shift. Study was not protocoled to  assess for constrictive physiology. Left ventricular diastolic parameters  are indeterminate. Elevated left  ventricular end-diastolic pressure.   Right Ventricle: The right ventricular size is normal. No increase in  right ventricular wall thickness. Right ventricular systolic function is  normal. There is severely elevated pulmonary artery systolic pressure.  The  tricuspid regurgitant velocity is  3.41 m/s, and with an assumed right atrial pressure of 15 mmHg, the  estimated right ventricular systolic pressure is 99991111 mmHg.   Left Atrium: Left atrial size was severely dilated.   Right Atrium: Right atrial size was severely dilated.   Pericardium: There is no evidence of pericardial effusion.   Mitral Valve: The mitral valve is abnormal. There is moderate  calcification of the mitral valve leaflet(s). Moderately decreased  mobility of the mitral valve leaflets. Severe mitral annular  calcification. Mild to moderate mitral valve regurgitation.  Moderate mitral valve stenosis. MV peak gradient, 22.7 mmHg. The mean  mitral valve gradient is 6.0 mmHg  with average heart rate of 74 bpm.   Tricuspid Valve: The tricuspid valve is normal in structure. Tricuspid  valve regurgitation is mild . No evidence of tricuspid stenosis.   Aortic Valve: The aortic valve is abnormal. Aortic valve regurgitation is  not visualized. Severe aortic stenosis is present. There is severe  calcifcation of the aortic valve. Aortic valve mean gradient measures 44.0  mmHg. Aortic valve peak gradient  measures 74.6 mmHg. Aortic valve area, by VTI measures 0.69 cm.   Pulmonic Valve: The pulmonic valve was normal in structure. Pulmonic valve  regurgitation is mild. No evidence of pulmonic stenosis.   Aorta: Aortic dilatation noted. There is mild dilatation of the ascending  aorta measuring 41 mm.   Venous: The inferior vena cava is dilated in size with less than 50%  respiratory variability, suggesting right atrial pressure of 15 mmHg.   IAS/Shunts: No atrial level shunt detected by color flow Doppler.     LEFT VENTRICLE  PLAX 2D  LVIDd:     4.91 cm Diastology  LVIDs:     2.59 cm LV e' lateral:  6.85 cm/s  LV PW:     1.11 cm LV E/e' lateral: 31.0  LV IVS:    0.90 cm LV e' medial:  7.07 cm/s  LVOT diam:   2.30 cm LV E/e' medial: 30.1  LV SV:     76  LV SV Index:  32  LVOT Area:   4.15 cm     LEFT ATRIUM       Index    RIGHT ATRIUM      Index  LA diam:    5.80 cm 2.44 cm/m RA Area:   35.40 cm  LA Vol (A2C):  217.0 ml 91.46 ml/m RA Volume:  133.00 ml 56.06 ml/m  LA Vol (A4C):  159.0 ml 67.02 ml/m  LA Biplane Vol: 186.0 ml 78.40 ml/m  AORTIC VALVE  AV Area (Vmax):  0.68 cm  AV Area (Vmean):  0.64 cm  AV Area (VTI):   0.69 cm  AV Vmax:      432.00 cm/s  AV Vmean:     303.000 cm/s  AV VTI:      1.100 m  AV Peak Grad:   74.6 mmHg  AV Mean Grad:   44.0 mmHg  LVOT Vmax:     70.20 cm/s  LVOT Vmean:    46.800 cm/s  LVOT VTI:     0.182 m    LVOT/AV VTI ratio: 0.17    AORTA  Ao Root diam: 3.80 cm  Ao Asc diam: 4.10 cm   MITRAL VALVE        TRICUSPID VALVE  MV Area (PHT): 2.42 cm   TR Peak grad:  46.5 mmHg  MV Peak grad: 22.7 mmHg  TR Vmax:  341.00 cm/s  MV Mean grad: 6.0 mmHg  MV Vmax:    2.38 m/s   SHUNTS  MV Vmean:   102.0 cm/s  Systemic VTI: 0.18 m  MV PHT:    144.35 msec Systemic Diam: 2.30 cm  MV Decel Time: 319 msec  MR Peak grad: 176.4 mmHg  MR Mean grad: 113.0 mmHg  MR Vmax:   664.00 cm/s  MR Vmean:   502.0 cm/s    MV E velocity: 212.50 cm/s     Cherlynn Kaiser MD  Electronically signed by Cherlynn Kaiser MD  Signature Date/Time: 07/15/2019/3:54:59 PM     Physicians Panel Physicians Referring Physician Case Authorizing Physician  Jettie Booze, MD (Primary)       Procedures RIGHT/LEFT HEART CATH AND CORONARY ANGIOGRAPHY     Conclusion The left ventricular systolic function is normal.  LV end diastolic pressure is normal.  The left ventricular ejection fraction is 55-65% by visual estimate.  There is severe aortic valve stenosis. Mean gradient 46 mm Hg.  There is mild mitral valve stenosis. Mitral valve area 2.1 cm2. Mean gradient 11 mm Hg.  Hemodynamic findings consistent with mild pulmonary hypertension.  No significant CAD. No significant CAD. Plan for TAVR w/u.      Recommendations Discharge Date In the absence of any other complications or medical issues, we expect the patient to be ready for discharge from a cath perspective on 08/12/2019.        Indications Severe aortic stenosis [I35.0 (ICD-10-CM)]     Procedural Details Technical Details The risks, benefits, and details of the procedure were explained to the patient. The patient verbalized understanding and wanted to proceed. Informed written consent was obtained.  PROCEDURE TECHNIQUE: After Xylocaine anesthesia, a 7 French sheath was placed in the right common femoral vein.  A 7 [place] balloontipped Swan-Ganz catheter was advanced to the pulmonary artery under fluoroscopic guidance, with ultrasound guidance. Hemodynamic pressures were obtained. Oxygen saturations were obtained. After Xylocaine anesthesia, a 30F sheath was placed in the right femoral artery with a single anterior needle wall stick. Left coronary angiography was done using a Judkins L4 guide catheter. Right coronary angiography was done using a Judkins R4 guide catheter. Left heart cath was done using a JR4 catheter with straight wire.     Contrast: 65 cc  Estimated blood loss <50 mL.   During this procedure medications were administered to achieve and maintain moderate conscious sedation while the patient's heart rate, blood pressure, and oxygen saturation were continuously monitored and I was present face-to-face 100% of this time.     Medications (Filter: Administrations occurring from 08/12/19 1005 to 08/12/19 1125)  Continuous medications are totaled by the amount administered until 08/12/19 1125.  Heparin (Porcine) in NaCl 1000-0.9 UT/500ML-% SOLN (mL) Total volume: 1,000 mL  Date/Time   Rate/Dose/Volume Action  08/12/19 1016  500 mL Given  1016  500 mL Given  midazolam (VERSED) injection (mg) Total dose: 2 mg  Date/Time   Rate/Dose/Volume Action  08/12/19 1025  2 mg Given  fentaNYL (SUBLIMAZE) injection (mcg) Total dose: 25 mcg  Date/Time   Rate/Dose/Volume Action  08/12/19 1026  25 mcg Given  lidocaine (PF) (XYLOCAINE) 1 % injection (mL) Total volume: 25 mL  Date/Time   Rate/Dose/Volume Action  08/12/19 1030  25 mL Given  heparin sodium (porcine) injection (Units) Total dose: 0 Units  Date/Time   Rate/Dose/Volume Action  08/12/19 1057  3,000 Units Canceled Entry  iohexol (OMNIPAQUE) 350 MG/ML injection (mL) Total volume:  65 mL  Date/Time   Rate/Dose/Volume Action  08/12/19 1123  65 mL Given     Sedation Time Sedation Time Physician-1: 59 minutes 38 seconds         Contrast Medication Name Total Dose  iohexol (OMNIPAQUE) 350 MG/ML injection 65 mL     Radiation/Fluoro Fluoro time: 12 (min)  DAP: [id number] (mGycm2)  Cumulative Air Kerma: [id number] (mGy)     Complications Complications documented before study signed (08/12/2019 [id number] AM)  No complications were associated with this study.  Documented by Jettie Booze, MD - 08/12/2019 11:27 AM     Coronary Findings Diagnostic Dominance: Right  Left Anterior Descending  The vessel exhibits minimal luminal irregularities.   Right Coronary Artery  The vessel exhibits minimal luminal irregularities.  Intervention No interventions have been documented.          Right Heart Right Heart Pressures Hemodynamic findings consistent with mild pulmonary hypertension. Ao sat 97%, PA sat 71%, mean PA 33 mm Hg; mean PCWP 19 Hg; CO 6.5 L/min; CI 2.8 (Fick)              Wall Motion Resting               All segments of the heart are normal.               Left Heart Left Ventricle The left ventricular size is normal. The left ventricular systolic function is normal. LV end diastolic pressure is normal. The left ventricular ejection fraction is 55-65% by visual estimate. No regional wall motion abnormalities.   Mitral Valve There is mild mitral valve stenosis.   Aortic Valve There is severe aortic valve stenosis.     Coronary Diagrams Diagnostic Dominance: Right  &&&&&  Intervention      Implants  No implant documentation for this case.      [facility] Images Link to Procedure Log  Show images for CARDIAC CATHETERIZATION Procedure Log     Images on Long Term Storage   Show images for [place]         Hemo Data   Most Recent Value  Fick Cardiac Output 6.57 L/min  Fick Cardiac Output Index 2.81 (L/min)/BSA  Thermal Cardiac Output 2.55 L/min  Thermal Cardiac Output Index 1.09 (L/min)/BSA  Aortic Mean Gradient 45.68 mmHg  Aortic Peak Gradient 65 mmHg  Aortic Valve Area  1.05  Aortic Value Area Index 0.45 cm2/BSA  Mitral Mean Gradient 11.44 mmHg  Mitral Peak Gradient 9 mmHg  Mitral Valve Area Index 0.67 cm2/BSA  RA A Wave -99 mmHg  RA V Wave 9 mmHg  RA Mean 8 mmHg  RV Systolic Pressure 66 mmHg  RV Diastolic Pressure 0 mmHg  RV EDP 7 mmHg  PA Systolic Pressure 65 mmHg  PA Diastolic Pressure 16 mmHg  PA Mean 33 mmHg  PW A Wave -99 mmHg  PW V Wave 29 mmHg  PW Mean 20 mmHg  AO Systolic Pressure [id number] mmHg  AO Diastolic Pressure 51 mmHg  AO Mean 75 mmHg  LV Systolic Pressure [id number] mmHg  LV Diastolic Pressure 0 mmHg  LV EDP 10 mmHg  AOp Systolic Pressure 123XX123 mmHg  AOp Diastolic Pressure 53 mmHg  AOp Mean Pressure 76 mmHg  LVp Systolic Pressure 0000000 mmHg  LVp Diastolic Pressure 1 mmHg  LVp EDP Pressure 11 mmHg  QP/QS 1  TPVR Index 11.76 HRUI  TSVR Index 26.73 HRUI  PVR SVR Ratio 0.21  TPVR/TSVR Ratio 0.44  ADDENDUM REPORT: 08/23/2019 15:30  ADDENDUM: 33 tear old male with severe aortic stenosis being evaluated for a TAVR procedure.  EXAM: Cardiac TAVR CT  TECHNIQUE: The patient was scanned on a [employer]. A 120 kV retrospective scan was triggered in the descending thoracic aorta at 111 HU's. Gantry rotation speed was 250 msecs and collimation was .6 mm. No beta blockade or nitro were given. The 3D data set was reconstructed in 5% intervals of the R-R cycle. Systolic and diastolic phases were analyzed on a dedicated work station using MPR, MIP and VRT modes. The patient received 80 cc of contrast.  FINDINGS: Aortic Root:  Aortic valve: Trileaflet  Aortic valve calcium score: 6709  Aortic annulus:  Diameter: 82mm x 41mm  Perimeter: 8mm  Area: 550 mm^2  Calcifications: Moderate calcification adjacent to left coronary cusp. Mild calcification adjacent to noncoronary cusp.  Coronary height: Min Left - 13mm, Max Left - 9mm; Min Right - 62mm  Sinotubular height: Left cusp -55mm ; Right cusp - 39mm;  Noncoronary cusp - 33mm  LVOT (as measured 3 mm below the annulus):  Diameter: 45mm x 12mm  Area: 523 mm^2  Calcifications: Moderate calcification beneath left coronary cusp  Aortic sinus width: Left cusp - 2mm; Right cusp - 89mm; Noncoronary cusp - 77mm  Sinotubular junction width: 34mm x 74mm  Optimum Fluoroscopic Angle for Delivery: LAO 7 CRA 4  Cardiac:  Right atrium: Moderate dilatation  Right ventricle: Moderate dilatation  Pulmonary arteries: Dilated, main PA measures 95mm  Pulmonary veins: Normal configuration  Left atrium: Severe dilatation  Left ventricle: Normal size  Pericardium: Normal thickness  Coronary arteries: Calcium score 2772 (90th percentile)  IMPRESSION: 1. Trileaflet aortic valve with severe calcifications (calcium score 6709)  2. Aortic annulus measures 2mm x 71mm with perimeter 49mm and area 550 mm^2. There is moderate annular calcification adjacent to left coronary cusp, which extends into LVOT. Annular measurements suitable for delivery of 17mm Edwards-Sapient 3 valve  2. Sufficient coronary to annulus distance, measuring 71mm to left main and 22mm to RCA  3.  Optimum Fluoroscopic Angle for Delivery: LAO 7 CRA 4  4.  Coronary calcium score 2772 (90th percentile)   Electronically Signed   By: Oswaldo Milian MD   On: 08/23/2019 15:30   Addended by Donato Heinz, MD on 08/23/2019 3:35 PM    Study Result  EXAM: OVER-READ INTERPRETATION  CT CHEST  The following report is an over-read performed by radiologist Dr. Salvatore Marvel of [facility], [place] on 08/21/2019. This over-read does not include interpretation of cardiac or coronary anatomy or pathology. The coronary CT interpretation by the cardiologist is attached.  COMPARISON:  04/17/2003 chest CT.  FINDINGS: Please see the separate concurrent chest CT angiogram report for details.  IMPRESSION: Please see the separate  concurrent chest CT angiogram report for details.  Electronically Signed: By: Ilona Sorrel M.D. On: 08/21/2019 11:33       CLINICAL DATA:  Severe symptomatic aortic stenosis. Pre-TAVR evaluation.  EXAM: CT ANGIOGRAPHY CHEST, ABDOMEN AND PELVIS  TECHNIQUE: Multidetector CT imaging through the chest, abdomen and pelvis was performed using the standard protocol during bolus administration of intravenous contrast. Multiplanar reconstructed images and MIPs were obtained and reviewed to evaluate the vascular anatomy.  CONTRAST:  172mL OMNIPAQUE IOHEXOL 350 MG/ML SOLN  COMPARISON:  04/17/2003 chest CT. 08/20/2018 CT abdomen/pelvis.  FINDINGS: CTA CHEST FINDINGS  Cardiovascular: Mild cardiomegaly. Diffuse thickening and coarse calcification of the aortic valve. No significant pericardial effusion/thickening.  Three-vessel coronary atherosclerosis. Atherosclerotic nonaneurysmal thoracic aorta. Top-normal caliber main pulmonary artery (3.4 cm diameter). No central pulmonary emboli.  Mediastinum/Nodes: No discrete thyroid nodules. Unremarkable esophagus. No pathologically enlarged axillary, mediastinal or hilar lymph nodes.  Lungs/Pleura: No pneumothorax. No pleural effusion. No acute consolidative airspace disease, lung masses or significant pulmonary nodules.  Musculoskeletal: No aggressive appearing focal osseous lesions. Marked thoracic spondylosis.  CTA ABDOMEN AND PELVIS FINDINGS  Hepatobiliary: Normal liver with no liver mass. Normal gallbladder with no radiopaque cholelithiasis. No biliary ductal dilatation.  Pancreas: Low-attenuation 1.3 cm focus in the pancreatic body (series 15/image 116). No additional potential pancreatic lesions. No pancreatic duct dilation.  Spleen: Normal size. No mass.  Adrenals/Urinary Tract: Right adrenal 1.1 cm nodule with density 49 HU, stable since 04/17/2003 CT, compatible with a benign adenoma. No left adrenal  nodules. No hydronephrosis. No contour deforming renal masses. Normal bladder.  Stomach/Bowel: Normal non-distended stomach. Normal caliber small bowel with no small bowel wall thickening. Appendectomy. Mild sigmoid diverticulosis, with no large bowel wall thickening or significant pericolonic fat stranding.  Vascular/Lymphatic: Atherosclerotic nonaneurysmal abdominal aorta. No pathologically enlarged lymph nodes in the abdomen or pelvis.  Reproductive: Mild prostatomegaly.  Other: No pneumoperitoneum, ascites or focal fluid collection.  Musculoskeletal: No aggressive appearing focal osseous lesions. Marked lumbar spondylosis.  VASCULAR MEASUREMENTS PERTINENT TO TAVR:  AORTA:  Minimal Aortic Diameter-14.1 x 13.2 mm  Severity of Aortic Calcification-moderate  RIGHT PELVIS:  Right Common Iliac Artery -  Minimal Diameter-10.9 x 10.7 mm  Tortuosity-mild  Calcification-mild  Right External Iliac Artery -  Minimal Diameter-8.4 x 8.3 mm  Tortuosity-moderate  Calcification-mild  Right Common Femoral Artery -  Minimal Diameter-8.4 x 7.8 mm  Tortuosity-mild  Calcification-mild  LEFT PELVIS:  Left Common Iliac Artery -  Minimal Diameter-11.4 x 9.6 mm  Tortuosity-mild to moderate  Calcification-mild  Left External Iliac Artery -  Minimal Diameter-9.7 x 8.4 mm  Tortuosity-mild  Calcification-mild  Left Common Femoral Artery -  Minimal Diameter-9.8 x 9.5 mm  Tortuosity-mild  Calcification-mild  Review of the MIP images confirms the above findings.  IMPRESSION: 1. Vascular findings and measurements pertinent to potential TAVR procedure, as detailed. 2. Diffuse thickening and coarse calcification of the aortic valve, compatible with the reported history of severe symptomatic aortic stenosis. 3. Low-attenuation 1.3 cm focus in the pancreatic body. No biliary or pancreatic duct dilation. MRI abdomen without and  with IV contrast recommended for further characterization. 4. Mild cardiomegaly. 5. Stable right adrenal adenoma. 6. Mild sigmoid diverticulosis. 7. Mild prostatomegaly. 8. Aortic Atherosclerosis (ICD10-I70.0).   Electronically Signed   By: Ilona Sorrel M.D.   On: 08/21/2019 12:33    STS Risk Score: Risk of Mortality: 1.282% Renal Failure: 1.925% Permanent Stroke: 1.012% Prolonged Ventilation: 6.057% DSW Infection: 0.131% Reoperation: 3.059% Morbidity or Mortality: 10.783% Short Length of Stay: 38.595% Long Length of Stay: 4.535%  Impression:  This 79 year old gentleman has stage D, severe, symptomatic aortic stenosis with [name] class II symptoms of exertional fatigue and shortness of breath consistent with chronic diastolic congestive heart failure.  I have personally reviewed his 2D echocardiogram, cardiac catheterization, and CTA studies.  Echocardiogram shows a severely calcified aortic valve with thickened leaflets and restricted mobility.  Aortic valve mean gradient is 44 mmHg consistent with severe aortic stenosis.  There is mild dilatation of the ascending aorta at 41 mm.  There is moderate calcification of the mitral valve leaflets with severe mitral annular calcification.  There is mild to moderate mitral regurgitation  and moderate mitral valve stenosis with a mitral valve peak gradient of 22.7 mmHg and a mean gradient of 6 mmHg.  Cardiac catheterization shows no significant coronary disease.  The mean gradient across aortic valve was measured at 46 mmHg.  I agree that aortic valve replacement is indicated in this patient for relief of his symptoms and to prevent progressive left ventricular deterioration.  Given his age and comorbid risk factors I think transcatheter aortic valve replacement would be the best option for him.  His gated cardiac CTA shows anatomy suitable for transcatheter aortic valve replacement using a SAPIEN 3 valve.  His  abdominal and pelvic CTA shows adequate pelvic vascular anatomy to allow transfemoral insertion.  The patient was counseled at length regarding treatment alternatives for management of severe symptomatic aortic stenosis. The risks and benefits of surgical intervention has been discussed in detail. Long-term prognosis with medical therapy was discussed. Alternative approaches such as conventional surgical aortic valve replacement, transcatheter aortic valve replacement, and palliative medical therapy were compared and contrasted at length. This discussion was placed in the context of the patient's own specific clinical presentation and past medical history. All of his questions have been addressed.   Following the decision to proceed with transcatheter aortic valve replacement, a discussion was held regarding what types of management strategies would be attempted intraoperatively in the event of life-threatening complications, including whether or not the patient would be considered a candidate for the use of cardiopulmonary bypass and/or conversion to open sternotomy for attempted surgical intervention. The patient is aware of the fact that transient use of cardiopulmonary bypass may be necessary.  Given his low surgical risk I think he would be a candidate for emergent sternotomy to manage any intraoperative complications.  The patient has been advised of a variety of complications that might develop including but not limited to risks of death, stroke, paravalvular leak, aortic dissection or other major vascular complications, aortic annulus rupture, device embolization, cardiac rupture or perforation, mitral regurgitation, acute myocardial infarction, arrhythmia, heart block or bradycardia requiring permanent pacemaker placement, congestive heart failure, respiratory failure, renal failure, pneumonia, infection, other late complications related to structural valve deterioration or migration, or other  complications that might ultimately cause a temporary or permanent loss of functional independence or other long term morbidity. The patient provides full informed consent for the procedure as described and all questions were answered.     Plan:  He is scheduled to have dental extractions tomorrow and will tentatively be scheduled for transfemoral TAVR on Tuesday, 09/16/2019.   I spent 60 minutes performing this consultation and > 50% of this time was spent face to face counseling and coordinating the care of this patient's severe symptomatic aortic stenosis.   Gaye Pollack, MD 09/03/2019

## 2019-09-03 NOTE — Therapy (Signed)
[place], [place], [address] Phone: [phone]   Fax:  [phone]  Physical Therapy Evaluation  Patient Details  Name: Gregory [name] MRN: [id number] Date of Birth: [birth year] Referring Provider (PT): Lauree Chandler, MD   Encounter Date: 09/03/2019  PT End of Session - 09/03/19 1302    Visit Number  1    Number of Visits  1    Date for PT Re-Evaluation  --   NA-1 time visit only   Authorization Type  [employer] Medicare    PT Start Time  1102    PT Stop Time  1144    PT Time Calculation (min)  42 min    Equipment Utilized During Treatment  Gait belt    Activity Tolerance  Patient limited by fatigue;Patient limited by pain    Behavior During Therapy  [facility] for tasks assessed/performed       Past Medical History:  Diagnosis Date  . 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 Aortic stenosis   . Arthritis   . Atrial fibrillation ([place])   . Cancer ([place])   . Chronic diastolic CHF (congestive heart failure) ([place])   . Diabetes mellitus    Type II  . Dysrhythmia    Afib  . History of blood transfusion    5 pints for peptic ulcer  . History of kidney stones    Passed x 2  . Hypertension   . Mitral valve disease    mild-moderate MR, moderate MS 07/15/19 echo  . 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 Ruptured disk   . Ulcer    peptic Ulcer - bleeding    Past Surgical History:  Procedure Laterality Date  . APPENDECTOMY  1967  . COLONOSCOPY    . left knee surgery    . left shoulder surgery    . RIGHT/LEFT HEART CATH AND CORONARY ANGIOGRAPHY N/A 08/12/2019   Procedure: RIGHT/LEFT HEART CATH AND CORONARY ANGIOGRAPHY;  Surgeon: Jettie Booze, MD;  Location: [facility];  Service: Cardiovascular;  Laterality: N/A;  . TONSILLECTOMY      There were no vitals filed for this visit.   Subjective  Assessment - 09/03/19 1106    Subjective  Pt. is a 79 y/o male referred to PT for pre-TAVR surgical assessment. He reports approximately 1 year history of issues with worsening fatigue on exertion with decreased activity tolerance, SOB.    Pertinent History  see PMH-history Non-Hodgkin's lymphoma, CHF, HTN, DM2, right>left knee OA, finger amputation right 4th and 5th digit    Limitations  Standing;Walking    Patient Stated Goals  Get heart better    Currently in Pain?  Yes    Pain Location  Knee    Pain Orientation  Right    Pain Descriptors / Indicators  Aching    Pain Type  Chronic pain    Pain Onset  More than a month ago    Pain Frequency  Intermittent    Aggravating Factors   standing and walking    Pain Relieving Factors  rest         [facility] PT Assessment - 09/03/19 0001      Assessment   Medical Diagnosis  Severe aortic stenosis    Referring Provider (PT)  Lauree Chandler, MD    Onset Date/Surgical Date  09/03/18   estimated per subjective  report 1 year symptom history   Hand Dominance  Right      Precautions   Precautions  None      Restrictions   Weight Bearing Restrictions  No      Balance Screen   Has the patient fallen in the past 6 months  No      [place] residence    Living Arrangements  Alone    Type of [place] to enter    Entrance Stairs-Number of Steps  2    Entrance Stairs-Rails  None    Home Layout  One level    Home Equipment  Crutches      Prior Function   Level of Independence  Independent with community mobility without device      Cognition   Overall Cognitive Status  Within Functional Limits for tasks assessed      Posture/Postural Control   Posture/Postural Control  Postural limitations    Postural Limitations  Rounded Shoulders;Increased thoracic kyphosis      ROM / Strength   AROM / PROM / Strength  AROM;Strength      AROM   Overall AROM Comments  see knee AROM  below, bilat. LE AROM otherwise grossly WFL. Bilateral shoulder flexion and abduction limited to 130 deg otherwise bilat. UE AROM grossly WFL    AROM Assessment Site  Knee    Right/Left Knee  Right;Left    Right Knee Extension  8   lacking 8 deg from neutral   Right Knee Flexion  103    Left Knee Extension  4   lacking 4 deg from neutral   Left Knee Flexion  112      Strength   Overall Strength Comments  Right grip 30 lbs., left grip 40 lbs. with grip dynamonometer    Strength Assessment Site  Shoulder;Elbow;Hip;Knee;Ankle    Right/Left Shoulder  Right;Left    Right Shoulder Flexion  5/5    Right Shoulder ABduction  4/5    Right Shoulder Internal Rotation  5/5    Right Shoulder External Rotation  5/5    Left Shoulder Flexion  5/5    Left Shoulder ABduction  4/5    Left Shoulder Internal Rotation  4+/5    Left Shoulder External Rotation  4+/5    Right/Left Elbow  Right;Left    Right Elbow Flexion  5/5    Right Elbow Extension  5/5    Left Elbow Flexion  5/5    Left Elbow Extension  5/5    Right/Left Hip  Right;Left    Right Hip Flexion  4/5    Right Hip Extension  4/5    Right Hip External Rotation   4/5    Right Hip Internal Rotation  4/5    Right Hip ABduction  4/5    Left Hip Flexion  4/5    Left Hip Extension  4/5    Left Hip External Rotation  4/5    Left Hip Internal Rotation  4/5    Left Hip ABduction  4/5    Right/Left Knee  Right;Left    Right Knee Flexion  5/5    Right Knee Extension  4+/5    Left Knee Flexion  5/5    Left Knee Extension  4+/5    Right/Left Ankle  Right;Left    Right Ankle Dorsiflexion  5/5    Right Ankle Inversion  5/5  Right Ankle Eversion  5/5    Left Ankle Dorsiflexion  5/5    Left Ankle Inversion  5/5    Left Ankle Eversion  5/5      Ambulation/Gait   Gait Comments  Pt. ambulates with wide BOS with antalgia worse on fatigue with decreased heel strike on left with tendency foot slap when fatigued, tendency to walk with hips  externally rotated with mild "steppage" pattern       [facility] Pre-Surgical Assessment - 09/03/19 0001    5 Meter Walk Test- trial 1  8 sec    5 Meter Walk Test- trial 2  8 sec.     5 Meter Walk Test- trial 3  8 sec.    5 meter walk test average  8 sec    4 Stage Balance Test tolerated for:   10 sec.    4 Stage Balance Test Position  3    comment  able to complete 10 sec each for first 3 positions but unable to perform SLS    Sit To Stand Test- trial 1  20 sec.    ADL/IADL Independent with:  Bathing;Dressing;Meal prep;Finances;Yard work    6 Minute Walk- Baseline  yes    BP (mmHg)  128/63    HR (bpm)  73    02 Sat (%RA)  92 %    Modified Borg Scale for Dyspnea  2- Mild shortness of breath    Perceived Rate of Exertion (Borg)  9- very light    6 Minute Walk Post Test  yes    BP (mmHg)  (!) 144/118   decreased to 136/90 with brief seated rest   HR (bpm)  122    02 Sat (%RA)  95 %    Modified Borg Scale for Dyspnea  3- Moderate shortness of breath or breathing difficulty    Perceived Rate of Exertion (Borg)  13- Somewhat hard    Aerobic Endurance Distance Walked  648    Endurance additional comments  1 standing rest after 2 min 40 sec lasting <10 sec                Objective measurements completed on examination: See above findings.              PT Education - 09/03/19 1301    Education Details  POC, tesing procedure    Person(s) Educated  Patient    Methods  Explanation    Comprehension  Verbalized understanding                  Plan - 09/03/19 1302    Clinical Impression Statement  see assessment in note    Personal Factors and Comorbidities  Age;Comorbidity 3+    Comorbidities  see PMH    Examination-Activity Limitations  Locomotion Level;Stand;Squat    Examination-Participation Restrictions  Community Activity;Shop;Cleaning    Stability/Clinical Decision Making  Stable/Uncomplicated    Clinical Decision Making  Low    Rehab Potential  Good     PT Frequency  One time visit    PT Duration  --   NA   PT Next Visit Plan  NA    PT Home Exercise Plan  NA       Patient demonstrated the following deficits and impairments:  Abnormal gait, Decreased endurance, Difficulty walking, Pain, Decreased strength, Decreased activity tolerance, Cardiopulmonary status limiting activity  Visit Diagnosis: Other abnormalities of gait and mobility       Clinical Impression Statement: Pt is  a 79 yo male presenting to OP PT for evaluation prior to possible TAVR surgery due to severe aortic stenosis. Pt reports onset of fatigue and shortness of breath approximately 12 months ago. Symptoms are limiting his walking tolerance and endurance. Pt presents with limited knee and shoulder ROM and decreased LE and left>right shoulder strength, decreased SLS balance but low fall risk per 4 stage balance test, decreased walking speed and limited aerobic endurance per 6 minute walk test. Pt ambulated 310 feet in 2 min 40 sec before requesting a standing rest beak lasting 15 seconds. At time of rest, patient's HR was 103 bpm and O2 was 96% on room air. Pt reported 2/10 shortness of breath on modified scale for dyspnea. Pt able to resume after rest and ambulate an additional 338 feet. Pt ambulated a total of 648 feet in 6 minute walk. RPE increased significantly with 6 minute walk test. Based on the Short Physical Performance Battery, patient has a frailty rating of 8/12 with </= 5/12 considered frail.          Problem List Patient Active Problem List   Diagnosis Date Noted  . Acute diastolic heart failure ([place]) 06/13/2015  . Atrial fibrillation with RVR ([place]) 06/13/2015  . Dyspnea 06/13/2015  . Type 2 diabetes mellitus with hyperlipidemia ([place]) 06/13/2015  . Severe aortic stenosis 05/25/2014  . Atrial fibrillation ([place]) 08/26/2013  . Mitral valve disorder 08/26/2013  . Mixed hyperlipidemia 08/26/2013  . Obesity 08/26/2013  . Essential hypertension, benign  08/26/2013  . Other malignant lymphomas, unspecified site, extranodal and solid organ sites 05/06/2012    Beaulah Dinning, PT, DPT 09/03/19 1:18 PM  [place] [facility] [address] [place], [place], [address] Phone: [phone]   Fax:  [phone]  Name: Gregory [name] MRN: [id number] Date of Birth: 1940/08/09

## 2019-09-04 ENCOUNTER — Encounter (HOSPITAL_COMMUNITY)
Admission: RE | Disposition: A | Discharge status: Home / Self Care | Payer: Self-pay | Source: Home / Self Care | Attending: Dentistry

## 2019-09-04 ENCOUNTER — Ambulatory Visit (HOSPITAL_COMMUNITY): Payer: PPO | Admitting: Vascular Surgery

## 2019-09-04 ENCOUNTER — Other Ambulatory Visit: Payer: Self-pay

## 2019-09-04 ENCOUNTER — Ambulatory Visit (HOSPITAL_COMMUNITY)
Admission: RE | Admit: 2019-09-04 | Discharge: 2019-09-04 | Disposition: A | Discharge status: Home / Self Care | Payer: PPO | Attending: Dentistry | Admitting: Dentistry

## 2019-09-04 ENCOUNTER — Encounter: Payer: Self-pay | Admitting: Physician Assistant

## 2019-09-04 ENCOUNTER — Other Ambulatory Visit: Payer: Self-pay | Admitting: Physician Assistant

## 2019-09-04 ENCOUNTER — Encounter (HOSPITAL_COMMUNITY): Payer: Self-pay | Admitting: Dentistry

## 2019-09-04 DIAGNOSIS — I350 Nonrheumatic aortic (valve) stenosis: Secondary | ICD-10-CM | POA: Diagnosis not present

## 2019-09-04 DIAGNOSIS — I4891 Unspecified atrial fibrillation: Secondary | ICD-10-CM | POA: Insufficient documentation

## 2019-09-04 DIAGNOSIS — Z7901 Long term (current) use of anticoagulants: Secondary | ICD-10-CM | POA: Insufficient documentation

## 2019-09-04 DIAGNOSIS — K045 Chronic apical periodontitis: Secondary | ICD-10-CM | POA: Insufficient documentation

## 2019-09-04 DIAGNOSIS — Z8572 Personal history of non-Hodgkin lymphomas: Secondary | ICD-10-CM | POA: Diagnosis not present

## 2019-09-04 DIAGNOSIS — I110 Hypertensive heart disease with heart failure: Secondary | ICD-10-CM | POA: Diagnosis not present

## 2019-09-04 DIAGNOSIS — K083 Retained dental root: Secondary | ICD-10-CM | POA: Diagnosis not present

## 2019-09-04 DIAGNOSIS — Z7984 Long term (current) use of oral hypoglycemic drugs: Secondary | ICD-10-CM | POA: Diagnosis not present

## 2019-09-04 DIAGNOSIS — K0530 Chronic periodontitis, unspecified: Secondary | ICD-10-CM | POA: Diagnosis not present

## 2019-09-04 DIAGNOSIS — K036 Deposits [accretions] on teeth: Secondary | ICD-10-CM | POA: Diagnosis not present

## 2019-09-04 DIAGNOSIS — E119 Type 2 diabetes mellitus without complications: Secondary | ICD-10-CM | POA: Insufficient documentation

## 2019-09-04 DIAGNOSIS — Z79899 Other long term (current) drug therapy: Secondary | ICD-10-CM | POA: Insufficient documentation

## 2019-09-04 DIAGNOSIS — M1711 Unilateral primary osteoarthritis, right knee: Secondary | ICD-10-CM | POA: Insufficient documentation

## 2019-09-04 DIAGNOSIS — K029 Dental caries, unspecified: Secondary | ICD-10-CM | POA: Diagnosis not present

## 2019-09-04 DIAGNOSIS — K0889 Other specified disorders of teeth and supporting structures: Secondary | ICD-10-CM | POA: Diagnosis not present

## 2019-09-04 DIAGNOSIS — I5032 Chronic diastolic (congestive) heart failure: Secondary | ICD-10-CM | POA: Diagnosis not present

## 2019-09-04 HISTORY — PX: MULTIPLE EXTRACTIONS WITH ALVEOLOPLASTY: SHX5342

## 2019-09-04 LAB — PROTIME-INR
INR: 1.2 (ref 0.8–1.2)
Prothrombin Time: 14.4 seconds (ref 11.4–15.2)

## 2019-09-04 LAB — GLUCOSE, CAPILLARY
Glucose-Capillary: 159 mg/dL — ABNORMAL HIGH (ref 70–99)
Glucose-Capillary: 158 mg/dL — ABNORMAL HIGH (ref 70–99)

## 2019-09-04 SURGERY — MULTIPLE EXTRACTION WITH ALVEOLOPLASTY
Anesthesia: General

## 2019-09-04 MED ORDER — AMINOCAPROIC ACID SOLUTION 5% (50 MG/ML)
10.0000 mL | ORAL | Status: DC
  Filled 2019-09-04: qty 100

## 2019-09-04 MED ORDER — HYDROCODONE-ACETAMINOPHEN 5-325 MG PO TABS
1.0000 | ORAL_TABLET | Freq: Four times a day (QID) | ORAL | Status: DC | PRN
Start: 2019-09-04 — End: 2019-09-04

## 2019-09-04 MED ORDER — ROCURONIUM BROMIDE 10 MG/ML (PF) SYRINGE
PREFILLED_SYRINGE | INTRAVENOUS | Status: DC | PRN
  Administered 2019-09-04: 50 mg via INTRAVENOUS

## 2019-09-04 MED ORDER — ONDANSETRON HCL 4 MG/2ML IJ SOLN
INTRAMUSCULAR | Status: DC | PRN
  Administered 2019-09-04: 4 mg via INTRAVENOUS

## 2019-09-04 MED ORDER — ORAL CARE MOUTH RINSE: 15.0000 mL | Freq: Once | OROMUCOSAL | Status: AC

## 2019-09-04 MED ORDER — DEXAMETHASONE SODIUM PHOSPHATE 10 MG/ML IJ SOLN
INTRAMUSCULAR | Status: DC | PRN
  Administered 2019-09-04: 4 mg via INTRAVENOUS

## 2019-09-04 MED ORDER — LIDOCAINE-EPINEPHRINE 2 %-1:100000 IJ SOLN
INTRAMUSCULAR | Status: AC
  Filled 2019-09-04: qty 10.2

## 2019-09-04 MED ORDER — OXYMETAZOLINE HCL 0.05 % NA SOLN
2.0000 | Freq: Once | NASAL | Status: AC
  Administered 2019-09-04: 2 via NASAL

## 2019-09-04 MED ORDER — BUPIVACAINE-EPINEPHRINE 0.5% -1:200000 IJ SOLN
INTRAMUSCULAR | Status: DC | PRN
  Administered 2019-09-04 (×2): 1.8 mL

## 2019-09-04 MED ORDER — PHENYLEPHRINE 40 MCG/ML (10ML) SYRINGE FOR IV PUSH (FOR BLOOD PRESSURE SUPPORT)
PREFILLED_SYRINGE | INTRAVENOUS | Status: AC
  Filled 2019-09-04: qty 10

## 2019-09-04 MED ORDER — PROPOFOL 10 MG/ML IV BOLUS
INTRAVENOUS | Status: AC
  Filled 2019-09-04: qty 20

## 2019-09-04 MED ORDER — LIDOCAINE-EPINEPHRINE 2 %-1:100000 IJ SOLN
INTRAMUSCULAR | Status: DC | PRN
  Administered 2019-09-04 (×6): 1.7 mL

## 2019-09-04 MED ORDER — FENTANYL CITRATE (PF) 100 MCG/2ML IJ SOLN
INTRAMUSCULAR | Status: DC | PRN
  Administered 2019-09-04: 150 ug via INTRAVENOUS
  Administered 2019-09-04: 50 ug via INTRAVENOUS

## 2019-09-04 MED ORDER — OXYMETAZOLINE HCL 0.05 % NA SOLN
NASAL | Status: AC
  Filled 2019-09-04: qty 30

## 2019-09-04 MED ORDER — THROMBIN 5000 UNITS EX SOLR
CUTANEOUS | Status: AC
  Filled 2019-09-04: qty 5000

## 2019-09-04 MED ORDER — LIDOCAINE 2% (20 MG/ML) 5 ML SYRINGE
INTRAMUSCULAR | Status: DC | PRN
  Administered 2019-09-04: 80 mg via INTRAVENOUS

## 2019-09-04 MED ORDER — THROMBIN 5000 UNITS EX SOLR
CUTANEOUS | Status: DC | PRN
  Administered 2019-09-04: 5000 [IU] via TOPICAL

## 2019-09-04 MED ORDER — DEXAMETHASONE SODIUM PHOSPHATE 10 MG/ML IJ SOLN
INTRAMUSCULAR | Status: AC
  Filled 2019-09-04: qty 1

## 2019-09-04 MED ORDER — ONDANSETRON HCL 4 MG/2ML IJ SOLN
INTRAMUSCULAR | Status: AC
  Filled 2019-09-04: qty 2

## 2019-09-04 MED ORDER — LACTATED RINGERS IV SOLN: INTRAVENOUS | Status: DC | PRN

## 2019-09-04 MED ORDER — PHENYLEPHRINE HCL-NACL 10-0.9 MG/250ML-% IV SOLN
INTRAVENOUS | Status: DC | PRN
  Administered 2019-09-04: 20 ug/min via INTRAVENOUS

## 2019-09-04 MED ORDER — 0.9 % SODIUM CHLORIDE (POUR BTL) OPTIME
TOPICAL | Status: DC | PRN
  Administered 2019-09-04: 1000 mL

## 2019-09-04 MED ORDER — BUPIVACAINE-EPINEPHRINE (PF) 0.5% -1:200000 IJ SOLN
INTRAMUSCULAR | Status: AC
  Filled 2019-09-04: qty 3.6

## 2019-09-04 MED ORDER — STERILE WATER FOR IRRIGATION IR SOLN
Status: DC | PRN
  Administered 2019-09-04: 1000 mL

## 2019-09-04 MED ORDER — FENTANYL CITRATE (PF) 250 MCG/5ML IJ SOLN
INTRAMUSCULAR | Status: AC
  Filled 2019-09-04: qty 5

## 2019-09-04 MED ORDER — LACTATED RINGERS IV SOLN: INTRAVENOUS | Status: DC

## 2019-09-04 MED ORDER — FENTANYL CITRATE (PF) 100 MCG/2ML IJ SOLN: 25.0000 ug | INTRAMUSCULAR | Status: DC | PRN

## 2019-09-04 MED ORDER — AMINOCAPROIC ACID SOLUTION 5% (50 MG/ML)
ORAL | Status: DC | PRN
  Administered 2019-09-04: 10 mL via ORAL

## 2019-09-04 MED ORDER — CHLORHEXIDINE GLUCONATE 0.12 % MT SOLN
15.0000 mL | Freq: Once | OROMUCOSAL | Status: AC
  Administered 2019-09-04: 15 mL via OROMUCOSAL
  Filled 2019-09-04: qty 15

## 2019-09-04 MED ORDER — MIDAZOLAM HCL 2 MG/2ML IJ SOLN
INTRAMUSCULAR | Status: AC
  Filled 2019-09-04: qty 2

## 2019-09-04 MED ORDER — ONDANSETRON HCL 4 MG/2ML IJ SOLN: 4.0000 mg | Freq: Once | INTRAMUSCULAR | Status: DC | PRN

## 2019-09-04 MED ORDER — PROPOFOL 10 MG/ML IV BOLUS
INTRAVENOUS | Status: DC | PRN
  Administered 2019-09-04: 140 mg via INTRAVENOUS

## 2019-09-04 MED ORDER — HYDROCODONE-ACETAMINOPHEN 5-325 MG PO TABS: 1.0000 | ORAL_TABLET | ORAL | 0 refills | Status: DC | PRN

## 2019-09-04 MED ORDER — SUGAMMADEX SODIUM 200 MG/2ML IV SOLN
INTRAVENOUS | Status: DC | PRN
  Administered 2019-09-04: 200 mg via INTRAVENOUS

## 2019-09-04 MED ORDER — HEMOSTATIC AGENTS (NO CHARGE) OPTIME
TOPICAL | Status: DC | PRN
  Administered 2019-09-04: 1 via TOPICAL

## 2019-09-04 SURGICAL SUPPLY — 33 items
ALCOHOL 70% 16 OZ (MISCELLANEOUS) ×2 IMPLANT
ATTRACTOMAT 16X20 MAGNETIC DRP (DRAPES) ×2 IMPLANT
BLADE SURG 15 STRL LF DISP TIS (BLADE) ×2 IMPLANT
BLADE SURG 15 STRL SS (BLADE) ×4
COVER SURGICAL LIGHT HANDLE (MISCELLANEOUS) ×2 IMPLANT
GAUZE 4X4 16PLY RFD (DISPOSABLE) ×3 IMPLANT
GAUZE PACKING FOLDED 2  STR (GAUZE/BANDAGES/DRESSINGS) ×2
GAUZE PACKING FOLDED 2 STR (GAUZE/BANDAGES/DRESSINGS) ×1 IMPLANT
GLOVE BIO SURGEON STRL SZ 6.5 (GLOVE) ×2 IMPLANT
GLOVE SURG ORTHO 8.0 STRL STRW (GLOVE) ×2 IMPLANT
GOWN STRL REUS W/ TWL LRG LVL3 (GOWN DISPOSABLE) ×1 IMPLANT
GOWN STRL REUS W/TWL 2XL LVL3 (GOWN DISPOSABLE) ×2 IMPLANT
GOWN STRL REUS W/TWL LRG LVL3 (GOWN DISPOSABLE) ×2
KIT BASIN OR (CUSTOM PROCEDURE TRAY) ×2 IMPLANT
KIT TURNOVER KIT B (KITS) ×2 IMPLANT
MANIFOLD NEPTUNE II (INSTRUMENTS) ×2 IMPLANT
NDL BLUNT 16X1.5 OR ONLY (NEEDLE) IMPLANT
NDL DENTAL 27 LONG (NEEDLE) IMPLANT
NEEDLE BLUNT 16X1.5 OR ONLY (NEEDLE) IMPLANT
NEEDLE DENTAL 27 LONG (NEEDLE) IMPLANT
NS IRRIG 1000ML POUR BTL (IV SOLUTION) ×2 IMPLANT
PACK EENT II TURBAN DRAPE (CUSTOM PROCEDURE TRAY) ×2 IMPLANT
PAD ARMBOARD 7.5X6 YLW CONV (MISCELLANEOUS) ×2 IMPLANT
SPONGE SURGIFOAM ABS GEL 100 (HEMOSTASIS) ×1 IMPLANT
SUCTION FRAZIER HANDLE 10FR (MISCELLANEOUS) ×2
SUCTION TUBE FRAZIER 10FR DISP (MISCELLANEOUS) ×1 IMPLANT
SUT CHROMIC 3 0 PS 2 (SUTURE) ×6 IMPLANT
SYR 50ML SLIP (SYRINGE) ×2 IMPLANT
TOWEL GREEN STERILE (TOWEL DISPOSABLE) ×2 IMPLANT
TUBE CONNECTING 12X1/4 (SUCTIONS) ×2 IMPLANT
WATER STERILE IRR 1000ML POUR (IV SOLUTION) ×2 IMPLANT
WATER TABLETS ICX (MISCELLANEOUS) ×2 IMPLANT
YANKAUER SUCT BULB TIP NO VENT (SUCTIONS) ×2 IMPLANT

## 2019-09-04 NOTE — Progress Notes (Signed)
PRE-OPERATIVE NOTE:  09/04/2019 KIANDRE ANDRY [id number]  VITALS: BP 116/71   Pulse (!) 56   Resp 20   Ht 5' 10.5" (1.791 m)   Wt 117.5 kg   SpO2 98%   BMI 36.64 kg/m   Lab Results  Component Value Date   WBC 8.6 09/02/2019   HGB 14.0 09/02/2019   HCT 43.5 09/02/2019   MCV 93.1 09/02/2019   PLT 184 09/02/2019   BMET    Component Value Date/Time   NA 136 09/02/2019 1052   NA 140 07/28/2019 1508   K 4.5 09/02/2019 1052   CL 101 09/02/2019 1052   CO2 26 09/02/2019 1052   GLUCOSE 130 (H) 09/02/2019 1052   BUN 12 09/02/2019 1052   BUN 20 07/28/2019 1508   CREATININE 0.92 09/02/2019 1052   CALCIUM 9.0 09/02/2019 1052   GFRNONAA >60 09/02/2019 1052   GFRAA >60 09/02/2019 1052    Lab Results  Component Value Date   INR 1.2 09/04/2019   INR 1.2 08/12/2019   INR 2.3 (H) 07/28/2019   No results found for: PTT   [name] presents for multiple dental extractions with alveoloplasty and gross debridement remaining dentition in the operating room with general anesthesia.    SUBJECTIVE: The patient denies any acute medical or dental changes and agrees to proceed with treatment as planned.  EXAM: No sign of acute dental changes.  ASSESSMENT: Patient is affected by chronic apical periodontitis, retained root segments, dental caries, chronic periodontitis, loose teeth, and accretions.  PLAN: Patient agrees to proceed with treatment as planned in the operating room as previously discussed and accepts the risks, benefits, and complications of the proposed treatment. Patient is aware of the risk for bleeding, bruising, swelling, infection, pain, nerve damage, soft tissue damage, damage to adjacent teeth, sinus involvement, root tip fracture, mandible fracture, and the risks of complications associated with the anesthesia. Patient also is aware of the potential for other complications up to and including death due to his overall cardiovascular and respiratory compromise.      Lenn Cal, DDS

## 2019-09-04 NOTE — Discharge Instructions (Signed)

## 2019-09-04 NOTE — Anesthesia Procedure Notes (Signed)
Procedure Name: Intubation Date/Time: 09/04/2019 7:51 AM Performed by: Inda Coke, CRNA Pre-anesthesia Checklist: Patient identified, Emergency Drugs available, Suction available and Patient being monitored Patient Re-evaluated:Patient Re-evaluated prior to induction Oxygen Delivery Method: Circle System Utilized Preoxygenation: Pre-oxygenation with 100% oxygen Induction Type: IV induction Ventilation: Mask ventilation without difficulty and Oral airway inserted - appropriate to patient size Laryngoscope Size: Mac and 4 Grade View: Grade I Tube type: Oral Tube size: 7.5 mm Number of attempts: 1 Airway Equipment and Method: Stylet and Oral airway Placement Confirmation: ETT inserted through vocal cords under direct vision,  positive ETCO2 and breath sounds checked- equal and bilateral Secured at: 22 cm Tube secured with: Tape Dental Injury: Teeth and Oropharynx as per pre-operative assessment

## 2019-09-04 NOTE — Op Note (Signed)
OPERATIVE REPORT  Patient:            Gregory [name] Date of Birth:  04-30-1940 MRN:                [id number]   DATE OF PROCEDURE:  09/04/2019  PREOPERATIVE DIAGNOSES: 1.  Severe aortic stenosis 2.  Preheart valve surgery dental protocol 3.  Chronic apical periodontitis 4.  Retained root segments 5.  Dental caries 6.  Chronic periodontitis 7.  Loose teeth 8.  Accretions  POSTOPERATIVE DIAGNOSES: 1.  Severe aortic stenosis 2.  Preheart valve surgery dental protocol 3.  Chronic apical periodontitis 4.  Retained root segments 5.  Dental caries 6.  Chronic periodontitis 7.  Loose teeth 8.  Accretions  OPERATIONS: 1. Multiple extraction of tooth numbers 2, 7, 8, 9, 11, 13, 14, 20, 30, and 31 2. 3 Quadrants of alveoloplasty 3. Gross debridement of remaining dentition   SURGEON: Lenn Cal, DDS  ASSISTANT: Molli Posey (dental assistant)  ANESTHESIA: General anesthesia via oral endotracheal tube.  MEDICATIONS: 1. Ancef 3 g IV prior to invasive dental procedures. 2. Local anesthesia with a total utilization of 6 carpules each containing 34 mg of lidocaine with 0.017 mg of epinephrine as well as 2 carpules each containing 9 mg of bupivacaine with 0.009 mg of epinephrine.  SPECIMENS: There are 10 teeth that were discarded.  DRAINS: None  CULTURES: None  COMPLICATIONS: None  ESTIMATED BLOOD LOSS: 100 mLs.  INTRAVENOUS FLUIDS: 500 mLs of Lactated ringers solution.  INDICATIONS: The patient was recently diagnosed with severe aortic stenosis.  A medically necessary dental consultation was then requested to evaluate poor dentition prior to heart valve surgery.  The patient was examined and treatment planned for multiple extractions with alveoloplasty and gross debridement of remaining dentition in the operating room with  general anesthesia.  This treatment plan was formulated to decrease the risks and complications associated with dental infection from  affecting the patient's systemic health and the anticipated heart valve surgery.  OPERATIVE FINDINGS: Patient was examined operating room number 8.  The teeth were identified for extraction. The patient was noted be affected by chronic apical periodontitis, retained root segments, dental caries, chronic periodontitis, loose teeth, and accretions.   DESCRIPTION OF PROCEDURE: Patient was brought to the main operating room number 8. Patient was then placed in the supine position on the operating table. General anesthesia was then induced per the anesthesia team. The patient was then prepped and draped in the usual manner for dental medicine procedure. A timeout was performed. The patient was identified and procedures were verified. A throat pack was placed at this time. The oral cavity was then thoroughly examined with the findings noted above. The patient was then ready for dental medicine procedure as follows:  Local anesthesia was then administered sequentially with a total utilization of 6 carpules each containing 34 mg of lidocaine with 0.017 mg of epinephrine as well as 2 carpules  each containing 9 mg bupivacaine with 0.009 mg of epinephrine.  The Maxillary left and right quadrants first approached. Anesthesia was then delivered utilizing infiltration with lidocaine with epinephrine. A #15 blade incision was then made from the maxillary left tuberosity and extended to the maxillary right tuberosity.  A  surgical flap was then carefully reflected. The maxillary teeth were then subluxated with a series of straight elevators. Tooth numbers 2, 7, 8, 9, 13, 14, and the coronal aspect of tooth #11 were then removed with a 150 forceps leaving  the retained root in the area #11.  A surgical handpiece and bur copious amount sterile water were then used to remove buccal and interseptal bone around the retained root.  The root was then elevated out with a root to pick without further complication. Alveoloplasty  was then performed utilizing a ronguers and bone file to help achieve primary closure. The surgical site was then irrigated with copious amounts of sterile saline. The tissues were approximated and trimmed appropriately.  A piece of Surgifoam impregnated with topical thrombin was then placed in each extraction socket appropriately.  The maxillary right surgical site was then closed from the maxillary right tuberosity and extended the mesial #8 utilizing 3-0 Chromic Gut suture in a continuous erupted suture technique x1.  The maxillary left surgical site was then closed from the maxillary left tuberosity and extended to the mesial of #9 utilizing 3-0 Chromic Gut suture in a continuous running suture technique x1.    At this point in time, the mandibular quadrants were approached. The patient was given bilateral inferior alveolar nerve blocks and long buccal nerve blocks utilizing the bupivacaine with epinephrine. Further infiltration was then achieved utilizing the lidocaine with epinephrine. A 15 blade incision was then made from the distal of number 32 and extended to the distal of #28.  A surgical flap was then carefully reflected.  Retained roots in the area of tooth numbers 30 and 31 were then removed with a rongeurs without complication.  Alveoloplasty was performed realizing rongeurs and bone file to assist in achieving primary closure.  The surgical sites were irrigated with copious amounts sterile saline.  The tissues were approximated and trimmed appropriately.  The surgical site was then closed with a distal #32 and extended to the distal #28 utilizing 3-0 Chromic Gut suture in a continuous erupted suture technique x1.    At this point in time, tooth #20 was approached.  It was elevated out with a Cryer's elevator without complication.  The socket was curetted and compressed appropriately.  Surgical site was irrigated with copious amounts sterile saline.  A piece of Surgifoam impregnated with topical  thrombin was placed in the extraction socket.  Surgical site was then closed from the distal of #20 and extended to the mesial #21 utilizing 3-0 Chromic Gut suture in a continuous erupted suture technique x1.  At this point in time, the remaining dentition was approached.  A sonic scaler was used to remove accretions.  A series of hand curettes were used to further remove accretions.  The sonic scaler was then again used to refine the removal of accretions.  This completed the gross debridement procedure.  At this point in time, the entire mouth was irrigated with copious amounts of sterile saline. The patient was examined for complications, seeing none, the dental medicine procedure was deemed to be complete. The throat pack was removed at this time. An oral airway was then placed at the request of the anesthesia team. A series of 4 x 4 gauze moistened with Amicar 5% rinse were placed in the mouth to aid hemostasis. The patient was then handed over to the anesthesia team for final disposition. After an appropriate amount of time, the patient was extubated and taken to the postanesthsia care unit in good condition. All counts were correct for the dental medicine procedure.  The patient is to continue Amicar 5% rinses postoperatively.  Patient is to rinse with 10 mils every hour for the next 10 hours in a swish and spit  manner.  Patient is to restart his warfarin therapy this evening if there is no significant oral bleeding present.    Lenn Cal, DDS.

## 2019-09-04 NOTE — H&P (Signed)
09/04/2019  Patient:            Gregory [name] Date of Birth:  1940/06/04 MRN:                [id number]   BP 116/71   Pulse (!) 56   Resp 20   Ht 5' 10.5" (1.791 m)   Wt 117.5 kg   SpO2 98%   BMI 36.64 kg/m    Gregory [name] is a 79 year old male with severe aortic stenosis with anticipated aortic valve replacement.  Patient was seen as part of a preheart valve surgery dental protocol examination.  Patient now presents for multiple dental extractions with alveoloplasty and gross debridement of remaining dentition in the operating room with general anesthesia.  Patient denies any acute medical or dental changes.  Please see the note from Dr. Cyndia Bent dated 09/03/2019 to use as the H&P for the dental operating room procedure.  Lenn Cal, DDS    [facility]     CARDIOTHORACIC SURGERY CONSULTATION REPORT     Referring Provider is Jettie Booze, MD  Primary Cardiologist is Larae Grooms, MD  PCP is Kathyrn Lass, MD          Chief Complaint    Patient presents with    .   Aortic Stenosis            Surgical eval for TAVR, review all studies/testing          HPI:     The patient is a 79 year old gentleman with a history of atrial fibrillation on Coumadin, chronic diastolic congestive heart failure mitral regurgitation, diabetes, hypertension, non-Hodgkin's lymphoma, and severe aortic stenosis who is being evaluated for aortic valve replacement.  He had an echocardiogram in 2017 that showed a mean gradient of 28 mmHg and a repeat echo in February 2020 which showed an increase in the mean gradient to 45 mmHg.  His left ventricular ejection fraction had decreased to 55 to 60% on his echocardiogram in February 2020.  His most recent echocardiogram on 07/15/2019 showed an aortic valve mean gradient of 44 mmHg.  Aortic valve area was measured at 0.69 cm and dimensionless index was 0.17.  Left ventricular ejection fraction was  unchanged at 55%.  He subsequently underwent cardiac catheterization this month showing no significant coronary disease.     He has noted some exertional fatigue and shortness of breath but is limited in his activity due to his severe right knee arthritis with bone-on-bone.  He denies any chest pain or pressure.  He has had no orthopnea.  He denies peripheral edema.  He is scheduled to have some teeth removed tomorrow by Dr. Enrique Sack. He lives in [place] alone. He is a retired Administrator          Past Medical History:    Diagnosis   Date    .   Amputation finger            Distal 4th & 5th digits of right hand    .   Aortic stenosis        .   Arthritis        .   Atrial fibrillation ([place])        .   Cancer ([place])        .   Chronic diastolic CHF (congestive heart failure) ([place])        .   Diabetes mellitus  Type II    .   Dysrhythmia            Afib    .   History of blood transfusion            5 pints for peptic ulcer    .   History of kidney stones            Passed x 2    .   Hypertension        .   Mitral valve disease            mild-moderate MR, moderate MS 07/15/19 echo    .   Other malignant lymphomas, unspecified site, extranodal and solid organ sites            Non Hodkins Lymothopathy    .   Ruptured disk        .   Ulcer            peptic Ulcer - bleeding                Past Surgical History:    Procedure   Laterality   Date    .   APPENDECTOMY       1967    .   COLONOSCOPY            .   left knee surgery            .   left shoulder surgery            .   RIGHT/LEFT HEART CATH AND CORONARY ANGIOGRAPHY   N/A   08/12/2019        Procedure: RIGHT/LEFT HEART CATH AND CORONARY ANGIOGRAPHY;  Surgeon: Jettie Booze, MD;  Location: [facility];  Service:  Cardiovascular;  Laterality: N/A;    .   TONSILLECTOMY                        Family History    Problem   Relation   Age of Onset    .   Hypertension   Father        .   Heart attack   Father        .   Heart attack   Mother        .   Cancer   Mother                LUNG    .   Stroke   Neg Hx               Social History             Socioeconomic History    .   Marital status:   Single            Spouse name:   Not on file    .   Number of children:   0    .   Years of education:   Not on file    .   Highest education level:   Not on file    Occupational History    .   Occupation:   Retired-Trucking    Tobacco Use    .   Smoking status:   Never Smoker    .   Smokeless tobacco:   Never Used    Substance and Sexual Activity    .   Alcohol use:   No    .  Drug use:   No    .   Sexual activity:   Never    Other Topics   Concern    .   Not on file    Social History Narrative    .   Not on file        Social Determinants of Health           Financial Resource Strain:     .   Difficulty of Paying Living Expenses:     Food Insecurity:     .   Worried About Charity fundraiser in the Last Year:     .   Arboriculturist in the Last Year:     Transportation Needs:     .   Film/video editor (Medical):     Marland Kitchen   Lack of Transportation (Non-Medical):     Physical Activity:     .   Days of Exercise per Week:     .   Minutes of Exercise per Session:     Stress:     .   Feeling of Stress :     Social Connections:     .   Frequency of Communication with Friends and Family:     .   Frequency of Social Gatherings with Friends and Family:     .   Attends Religious Services:     .   Active Member of Clubs or Organizations:     .   Attends Theatre manager Meetings:     Marland Kitchen   Marital Status:     Intimate Partner Violence:     .   Fear of Current or Ex-Partner:     .   Emotionally Abused:     Marland Kitchen   Physically Abused:     .   Sexually Abused:                  Current Outpatient Medications    Medication   Sig   Dispense   Refill    .   acetaminophen (TYLENOL) 650 MG CR tablet   Take 1,300 mg by mouth every 8 (eight) hours as needed for pain.            Marland Kitchen   atorvastatin (LIPITOR) 20 MG tablet   Take 20 mg by mouth daily at 6 PM.             .   diltiazem (CARDIZEM CD) 120 MG 24 hr capsule   Take 120 mg by mouth daily at 6 PM.            .   erythromycin ophthalmic ointment   Place 1 application into the left eye 3 (three) times daily. Place ointment to the lower area of the            .   lisinopril (PRINIVIL,ZESTRIL) 10 MG tablet   Take 10 mg by mouth daily.            .   metFORMIN (GLUCOPHAGE-XR) 500 MG 24 hr tablet   Take 1 tablet (500 mg total) by mouth 2 (two) times daily. (Patient taking differently: Take 1,000 mg by mouth 2 (two) times daily. )            .   metoprolol succinate (TOPROL-XL) 100 MG 24 hr tablet   Take 100 mg by mouth daily. Take with or immediately following a meal.            .  Multiple Vitamins-Minerals (PRESERVISION AREDS 2 PO)   Take 1 tablet by mouth daily.            .   ONE TOUCH ULTRA TEST test strip   1 each by Other route as needed (BLOOD SUGAR).             Glory Rosebush DELICA LANCETS [id number] MISC   Apply 1 each topically daily as needed (BLOOD SUGAR).             Marland Kitchen   pioglitazone (ACTOS) 15 MG tablet   Take 15 mg by mouth daily.             Marland Kitchen   warfarin (COUMADIN) 6 MG tablet   Take 3-6 mg by mouth See admin instructions. Take 0.5 tablet (3 mg) by mouth on Thursdays & Saturdays & take 1 tablet (6 mg) by mouth on Sundays, Mondays, Tuesdays, Wednesdays, &  Fridays at night.                No current facility-administered medications for this visit.                 Facility-Administered Medications Ordered in Other Visits    Medication   Dose   Route   Frequency   Provider   Last Rate   Last Admin    .   [START ON 09/04/2019] ceFAZolin (ANCEF) 3 g in dextrose 5 % 50 mL IVPB    3 g   Intravenous   To [facility]   Lenn Cal, DDS                        Allergies    Allergen   Reactions    .   Macrobid [Nitrofurantoin]        .   Nystatin        .   Prevnar 13 [Pneumococcal 13-Val Conj Vacc]        .   Bisoprolol-Hydrochlorothiazide   Other (See Comments)            "Severe" arthralgias and myalgias    .   Nsaids   Other (See Comments)            GI upset, Peptic ulcer disease                Review of Systems:                 General:                      Normal appetite, + decreased energy, no weight gain, no weight loss, no fever              Cardiac:                       no chest pain with exertion, no chest pain at rest, +SOB with  exertion, no resting SOB, no PND, no orthopnea, no palpitations, no arrhythmia, no atrial fibrillation, no LE edema, no dizzy spells, no syncope              Respiratory:                 + exertional shortness of breath, no home oxygen, no productive cough, no dry cough, no bronchitis, no wheezing, no hemoptysis, no asthma, no pain with inspiration or cough, no sleep apnea, no CPAP at night  GI:                               no difficulty swallowing, no reflux, no frequent heartburn, no hiatal hernia, no abdominal pain, no constipation, no diarrhea, no hematochezia, no hematemesis, no melena              GU:                              no dysuria,  no frequency, no urinary tract infection, no hematuria, no enlarged prostate, no kidney stones, no kidney disease              Vascular:                      no pain suggestive of claudication, no pain in feet, no leg cramps, no varicose veins, no DVT, no non-healing foot ulcer              Neuro:                         no stroke, no TIA's, no seizures, no headaches, no temporary blindness one eye,  no slurred speech, no peripheral neuropathy, no chronic pain, no instability of gait, no memory/cognitive dysfunction              Musculoskeletal:         + arthritis, + joint swelling right knee, no myalgias, + difficulty walking due to right knee, Normal mobility               Skin:                            no rash, no itching, no skin infections, no pressure sores or ulcerations              Psych:                         no anxiety, no depression, no nervousness, no unusual recent stress              Eyes:                           no blurry vision, no floaters, no recent vision changes, does not wear glasses or contacts              ENT:                            no hearing loss, + loose or painful teeth, + partial dentures  Hematologic:                        no easy bruising, no abnormal bleeding, no clotting disorder, no frequent epistaxis              Endocrine:                   + diabetes, does check CBG's at home                                 Physical  Exam:                 BP 121/80   Pulse 67   Temp 97.6 F (36.4 C) (Skin)   Resp 20   Ht 5' 10.5" (1.791 m)   Wt 259 lb (117.5 kg)   SpO2 95% Comment: RA  BMI 36.64 kg/m               General:                      Large frame,  well-appearing, looks younger than his age.              HEENT:                       Unremarkable, NCAT, PERLA, EOMI, multiple missing and broken teeth in maxilla              Neck:                           no JVD, no bruits, no adenopathy               Chest:                          clear to auscultation, symmetrical breath sounds, no wheezes, no rhonchi               CV:                              RRR, grade lll/VI  crescendo/decrescendo murmur heard best at RSB,  no diastolic murmur              Abdomen:                    soft, non-tender              Extremities:                 warm, well-perfused, pulses palpable at ankles, no LE edema              Rectal/GU                   Deferred              Neuro:                         Grossly non-focal and symmetrical throughout              Skin:                            Clean and dry, no rashes, no breakdown        Diagnostic Tests:     Patient Name:   Gregory [name] Date of Exam: 07/15/2019  Medical Rec #:  [id number]     Height:       70.5 in  Accession #:    [id number]    Weight:       267.0 lb  Date of Birth:  02/01/1941    BSA:          2.373 m  Patient Age:    55 years      BP:  118/70 mmHg  Patient Gender: M             HR:           54 bpm.  [facility]   Procedure: 2D Echo, 3D Echo, Cardiac Doppler and Color Doppler                                  MODIFIED REPORT:     This report was modified by Cherlynn Kaiser MD on 07/15/2019 due to  report                                     revision.   Indications:     I35.0 Aortic Stenosis     History:         Patient has prior history of Echocardiogram examinations,  most                   recent 05/30/2018. Aortic Valve Disease and Mitral Valve                   Disease, Arrythmias:RBBB and Atrial Fibrillation; Risk                   Factors:Family History of Coronary Artery Disease,  Hypertension                   and Diabetes. History of Lymphoma, Severe Aortic Stenosis                   (prior Mean 24mmHG).     Sonographer:     Deliah Boston RDCS  Referring Phys:  [place]  Diagnosing Phys: Cherlynn Kaiser MD      Sonographer Comments: Technically difficult study due to poor echo  windows.  IMPRESSIONS     1. Left ventricular ejection fraction, by estimation, is 55%. The left  ventricle has normal function. Left ventricular  endocardial border not  optimally defined to evaluate regional wall motion. Left ventricular  diastolic parameters are indeterminate.  Elevated left ventricular end-diastolic pressure. There is septal shudder  with mild respiratory related septal shift. Study was not protocoled to  assess for constrictive physiology.   2. Right ventricular systolic function is normal. The right ventricular  size is normal. There is severely elevated pulmonary artery systolic  pressure. The estimated right ventricular systolic pressure is 99991111 mmHg.   3. Left atrial size was severely dilated.   4. Right atrial size was severely dilated.   5. Calcification of the aortomitral continuity with severe aortic valve  stenosis and moderate mitral valve stesnosis. Findings suggestive of  radiation heart disease.   6. The mitral valve is abnormal. Mild to moderate mitral valve  regurgitation. Moderate mitral stenosis. The mean mitral valve gradient is  6.0 mmHg with average heart rate of 74 bpm.   7. The aortic valve is abnormal. Aortic valve regurgitation is not  visualized. Severe aortic valve stenosis. Aortic valve mean gradient  measures 44.0 mmHg.   8. Aortic dilatation noted. There is mild dilatation of the ascending  aorta measuring 41 mm.   9. The inferior vena cava is dilated in size with <50% respiratory  variability, suggesting right atrial pressure of 15 mmHg.   Comparison(s): A prior study was performed on 05/30/2018. Comparison made  by  report only. No significant change in biventricular function, aortic  stenosis or mitral valve stenosis.   FINDINGS   Left Ventricle: Left ventricular ejection fraction, by estimation, is  55%. The left ventricle has normal function. Left ventricular endocardial  border not optimally defined to evaluate regional wall motion. The left  ventricular internal cavity size was  normal in size. There is no left ventricular hypertrophy. Septal shudder  with mild  respiratory related septal shift. Study was not protocoled to  assess for constrictive physiology. Left ventricular diastolic parameters  are indeterminate. Elevated left  ventricular end-diastolic pressure.   Right Ventricle: The right ventricular size is normal. No increase in  right ventricular wall thickness. Right ventricular systolic function is  normal. There is severely elevated pulmonary artery systolic pressure. The  tricuspid regurgitant velocity is  3.41 m/s, and with an assumed right atrial pressure of 15 mmHg, the  estimated right ventricular systolic pressure is 99991111 mmHg.   Left Atrium: Left atrial size was severely dilated.   Right Atrium: Right atrial size was severely dilated.   Pericardium: There is no evidence of pericardial effusion.   Mitral Valve: The mitral valve is abnormal. There is moderate  calcification of the mitral valve leaflet(s). Moderately decreased  mobility of the mitral valve leaflets. Severe mitral annular  calcification. Mild to moderate mitral valve regurgitation.  Moderate mitral valve stenosis. MV peak gradient, 22.7 mmHg. The mean  mitral valve gradient is 6.0 mmHg with average heart rate of 74 bpm.   Tricuspid Valve: The tricuspid valve is normal in structure. Tricuspid  valve regurgitation is mild . No evidence of tricuspid stenosis.   Aortic Valve: The aortic valve is abnormal. Aortic valve regurgitation is  not visualized. Severe aortic stenosis is present. There is severe  calcifcation of the aortic valve. Aortic valve mean gradient measures 44.0  mmHg. Aortic valve peak gradient  measures 74.6 mmHg. Aortic valve area, by VTI measures 0.69 cm.   Pulmonic Valve: The pulmonic valve was normal in structure. Pulmonic valve  regurgitation is mild. No evidence of pulmonic stenosis.   Aorta: Aortic dilatation noted. There is mild dilatation of the ascending  aorta measuring 41 mm.   Venous: The inferior vena cava is dilated in size  with less than 50%  respiratory variability, suggesting right atrial pressure of 15 mmHg.   IAS/Shunts: No atrial level shunt detected by color flow Doppler.      LEFT VENTRICLE  PLAX 2D  LVIDd:         4.91 cm  Diastology  LVIDs:         2.59 cm  LV e' lateral:   6.85 cm/s  LV PW:         1.11 cm  LV E/e' lateral: 31.0  LV IVS:        0.90 cm  LV e' medial:    7.07 cm/s  LVOT diam:     2.30 cm  LV E/e' medial:  30.1  LV SV:         76  LV SV Index:   32  LVOT Area:     4.15 cm      LEFT ATRIUM              Index       RIGHT ATRIUM           Index  LA diam:        5.80 cm  2.44 cm/m  RA Area:  35.40 cm  LA Vol (A2C):   217.0 ml 91.46 ml/m RA Volume:   133.00 ml 56.06 ml/m  LA Vol (A4C):   159.0 ml 67.02 ml/m  LA Biplane Vol: 186.0 ml 78.40 ml/m   AORTIC VALVE  AV Area (Vmax):    0.68 cm  AV Area (Vmean):   0.64 cm  AV Area (VTI):     0.69 cm  AV Vmax:           432.00 cm/s  AV Vmean:          303.000 cm/s  AV VTI:            1.100 m  AV Peak Grad:      74.6 mmHg  AV Mean Grad:      44.0 mmHg  LVOT Vmax:         70.20 cm/s  LVOT Vmean:        46.800 cm/s  LVOT VTI:          0.182 m  LVOT/AV VTI ratio: 0.17     AORTA  Ao Root diam: 3.80 cm  Ao Asc diam:  4.10 cm   MITRAL VALVE                TRICUSPID VALVE  MV Area (PHT): 2.42 cm     TR Peak grad:   46.5 mmHg  MV Peak grad:  22.7 mmHg    TR Vmax:        341.00 cm/s  MV Mean grad:  6.0 mmHg  MV Vmax:       2.38 m/s     SHUNTS  MV Vmean:      102.0 cm/s   Systemic VTI:  0.18 m  MV PHT:        144.35 msec  Systemic Diam: 2.30 cm  MV Decel Time: 319 msec  MR Peak grad: 176.4 mmHg  MR Mean grad: 113.0 mmHg  MR Vmax:      664.00 cm/s  MR Vmean:     502.0 cm/s     MV E velocity: 212.50 cm/s      Cherlynn Kaiser MD  Electronically signed by Cherlynn Kaiser MD  Signature Date/Time: 07/15/2019/3:54:59 PM         Physicians   Panel Physicians   Referring Physician   Case Authorizing Physician     Jettie Booze, MD (Primary)                     Procedures   RIGHT/LEFT HEART CATH AND CORONARY ANGIOGRAPHY             Conclusion   .The left ventricular systolic function is normal.    .LV end diastolic pressure is normal.    .The left ventricular ejection fraction is 55-65% by visual estimate.    .There is severe aortic valve stenosis. Mean gradient 46 mm Hg.    .There is mild mitral valve stenosis. Mitral valve area 2.1 cm2. Mean gradient 11 mm Hg.    Marland KitchenHemodynamic findings consistent with mild pulmonary hypertension.    .No significant CAD.   No significant CAD. Plan for TAVR w/u.              Recommendations   Discharge Date   In the absence of any other complications or medical issues, we expect the patient to be ready for discharge from a cath perspective on 08/12/2019.  Indications   Severe aortic stenosis [I35.0 (ICD-10-CM)]             Procedural Details   Technical Details   The risks, benefits, and details of the procedure were explained to the patient. The patient verbalized understanding and wanted to proceed. Informed written consent was obtained.  PROCEDURE TECHNIQUE: After Xylocaine anesthesia, a 7 French sheath was placed in the right common femoral vein. A 7 [place] balloontipped Swan-Ganz catheter was advanced to the pulmonary artery under fluoroscopic guidance, with ultrasound guidance. Hemodynamic pressures were obtained. Oxygen saturations were obtained. After Xylocaine anesthesia, a 20F sheath was placed in the right femoral artery with a single anterior needle wall stick. Left coronary angiography was done using a Judkins L4 guide catheter. Right coronary angiography was done using a Judkins R4 guide catheter. Left heart cath was done using a JR4 catheter with straight wire.     Contrast: 65 cc  Estimated blood loss <50 mL.   During this procedure medications  were administered to achieve and maintain moderate conscious sedation while the patient's heart rate, blood pressure, and oxygen saturation were continuously monitored and I was present face-to-face 100% of this time.             Medications  (Filter: Administrations occurring from 08/12/19 1005 to 08/12/19 1125)    Continuous medications are totaled by the amount administered until 08/12/19 1125.    Heparin (Porcine) in NaCl 1000-0.9 UT/500ML-% SOLN (mL)  Total volume: 1,000 mL    Date/Time        Rate/Dose/Volume   Action    08/12/19 1016       500 mL   Given    1016       500 mL   Given    midazolam (VERSED) injection (mg)  Total dose: 2 mg    Date/Time        Rate/Dose/Volume   Action    08/12/19 1025       2 mg   Given    fentaNYL (SUBLIMAZE) injection (mcg)  Total dose: 25 mcg    Date/Time        Rate/Dose/Volume   Action    08/12/19 1026       25 mcg   Given    lidocaine (PF) (XYLOCAINE) 1 % injection (mL)  Total volume: 25 mL    Date/Time        Rate/Dose/Volume   Action    08/12/19 1030       25 mL   Given    heparin sodium (porcine) injection (Units)  Total dose: 0 Units    Date/Time        Rate/Dose/Volume   Action    08/12/19 1057       3,000 Units   Canceled Entry    iohexol (OMNIPAQUE) 350 MG/ML injection (mL)  Total volume: 65 mL    Date/Time        Rate/Dose/Volume   Action    08/12/19 1123       65 mL   Given             Sedation Time   Sedation Time Physician-1: 59 minutes 38 seconds                      Contrast   Medication Name   Total Dose    iohexol (OMNIPAQUE) 350 MG/ML injection   65 mL  Radiation/Fluoro   Fluoro time: 12 (min)   DAP: [id number] (mGycm2)   Cumulative Air Kerma: 513 (mGy)             Complications   Complications documented before study  signed (08/12/2019 99991111 AM)    No complications were associated with this study.    Documented by Jettie Booze, MD - 08/12/2019 11:27 AM             Coronary Findings  Diagnostic  Dominance: Right    Left Anterior Descending    The vessel exhibits minimal luminal irregularities.        Right Coronary Artery    The vessel exhibits minimal luminal irregularities.    Intervention  No interventions have been documented.                           Right Heart   Right Heart Pressures   Hemodynamic findings consistent with mild pulmonary hypertension. Ao sat 97%, PA sat 71%, mean PA 33 mm Hg; mean PCWP 19 Hg; CO 6.5 L/min; CI 2.8 (Fick)                                        Wall Motion          Resting                untitled image            untitled image                All segments of the heart are normal.                              Left Heart   Left Ventricle   The left ventricular size is normal. The left ventricular systolic function is normal. LV end diastolic pressure is normal. The left ventricular ejection fraction is 55-65% by visual estimate. No regional wall motion abnormalities.        Mitral Valve   There is mild mitral valve stenosis.        Aortic Valve   There is severe aortic valve stenosis.             Coronary Diagrams  Diagnostic  Dominance: Right   &&&&&   Intervention                 Implants    No implant documentation for this case.                 Syngo Images   Link to Procedure Log    Show images for CARDIAC CATHETERIZATION   Procedure Log             Images on Long Term Storage    Show images for Jafari, Morrical                      Hemo Data        Most Recent Value    Fick Cardiac Output   6.57 L/min    Fick Cardiac Output Index   2.81  (L/min)/BSA    Thermal Cardiac Output   2.55 L/min    Thermal Cardiac Output Index   1.09 (L/min)/BSA    Aortic Mean Gradient   45.68 mmHg  Aortic Peak Gradient   65 mmHg    Aortic Valve Area   1.05    Aortic Value Area Index   0.45 cm2/BSA    Mitral Mean Gradient   11.44 mmHg    Mitral Peak Gradient   9 mmHg    Mitral Valve Area Index   0.67 cm2/BSA    RA A Wave   -99 mmHg    RA V Wave   9 mmHg    RA Mean   8 mmHg    RV Systolic Pressure   66 mmHg    RV Diastolic Pressure   0 mmHg    RV EDP   7 mmHg    PA Systolic Pressure   65 mmHg    PA Diastolic Pressure   16 mmHg    PA Mean   33 mmHg    PW A Wave   -99 mmHg    PW V Wave   29 mmHg    PW Mean   20 mmHg    AO Systolic Pressure   [id number] mmHg    AO Diastolic Pressure   51 mmHg    AO Mean   75 mmHg    LV Systolic Pressure   [id number] mmHg    LV Diastolic Pressure   0 mmHg    LV EDP   10 mmHg    AOp Systolic Pressure   [id number] mmHg    AOp Diastolic Pressure   53 mmHg    AOp Mean Pressure   76 mmHg    LVp Systolic Pressure   0000000 mmHg    LVp Diastolic Pressure   1 mmHg    LVp EDP Pressure   11 mmHg    QP/QS   1    TPVR Index   11.76 HRUI    TSVR Index   26.73 HRUI    PVR SVR Ratio   0.21    TPVR/TSVR Ratio   0.44              ADDENDUM REPORT: 08/23/2019 15:30     ADDENDUM:  1 tear old male with severe aortic stenosis being evaluated for a  TAVR procedure.     EXAM:  Cardiac TAVR CT     TECHNIQUE:  The patient was scanned on a [employer]. A 120 kV  retrospective scan was triggered in the descending thoracic aorta at  111 HU's. Gantry rotation speed was 250 msecs and collimation was .6  mm. No beta blockade or nitro were given. The 3D data set was  reconstructed in 5% intervals of the R-R cycle. Systolic and  diastolic phases were analyzed on  a dedicated work station using  MPR, MIP and VRT modes. The patient received 80 cc of contrast.     FINDINGS:  Aortic Root:     Aortic valve: Trileaflet     Aortic valve calcium score: 6709     Aortic annulus:     Diameter: 104mm x 22mm     Perimeter: 46mm     Area: 550 mm^2     Calcifications: Moderate calcification adjacent to left coronary  cusp. Mild calcification adjacent to noncoronary cusp.     Coronary height: Min Left - 82mm, Max Left - 50mm; Min Right - 105mm     Sinotubular height: Left cusp -38mm ; Right cusp - 44mm; Noncoronary  cusp - 41mm     LVOT (as measured 3 mm below the annulus):     Diameter: 37mm x 59mm  Area: 523 mm^2     Calcifications: Moderate calcification beneath left coronary cusp     Aortic sinus width: Left cusp - 37mm; Right cusp - 37mm; Noncoronary  cusp - 11mm     Sinotubular junction width: 43mm x 56mm     Optimum Fluoroscopic Angle for Delivery: LAO 7 CRA 4     Cardiac:     Right atrium: Moderate dilatation     Right ventricle: Moderate dilatation     Pulmonary arteries: Dilated, main PA measures 63mm     Pulmonary veins: Normal configuration     Left atrium: Severe dilatation     Left ventricle: Normal size     Pericardium: Normal thickness     Coronary arteries: Calcium score 2772 (90th percentile)     IMPRESSION:  1. Trileaflet aortic valve with severe calcifications (calcium score  6709)     2. Aortic annulus measures 21mm x 63mm with perimeter 73mm and area  550 mm^2. There is moderate annular calcification adjacent to left  coronary cusp, which extends into LVOT. Annular measurements  suitable for delivery of 76mm Edwards-Sapient 3 valve     2. Sufficient coronary to annulus distance, measuring 66mm to left  main and 20mm to RCA     3.  Optimum Fluoroscopic Angle for Delivery: LAO 7 CRA 4     4.  Coronary calcium score 2772 (90th  percentile)        Electronically Signed    By: Oswaldo Milian MD    On: 08/23/2019 15:30       Addended by Donato Heinz, MD on 08/23/2019  3:35 PM       Study Result       EXAM:  OVER-READ INTERPRETATION  CT CHEST     The following report is an over-read performed by radiologist Dr.  Salvatore Marvel of [facility], [place] on 08/21/2019. This over-read  does not include interpretation of cardiac or coronary anatomy or  pathology. The coronary CT interpretation by the cardiologist is  attached.     COMPARISON:  04/17/2003 chest CT.     FINDINGS:  Please see the separate concurrent chest CT angiogram report for  details.     IMPRESSION:  Please see the separate concurrent chest CT angiogram report for  details.     Electronically Signed:  By: Ilona Sorrel M.D.  On: 08/21/2019 11:33                CLINICAL DATA:  Severe symptomatic aortic stenosis. Pre-TAVR  evaluation.     EXAM:  CT ANGIOGRAPHY CHEST, ABDOMEN AND PELVIS     TECHNIQUE:  Multidetector CT imaging through the chest, abdomen and pelvis was  performed using the standard protocol during bolus administration of  intravenous contrast. Multiplanar reconstructed images and MIPs were  obtained and reviewed to evaluate the vascular anatomy.     CONTRAST:  115mL OMNIPAQUE IOHEXOL 350 MG/ML SOLN     COMPARISON:  04/17/2003 chest CT. 08/20/2018 CT abdomen/pelvis.     FINDINGS:  CTA CHEST FINDINGS     Cardiovascular: Mild cardiomegaly. Diffuse thickening and coarse  calcification of the aortic valve. No significant pericardial  effusion/thickening. Three-vessel coronary atherosclerosis.  Atherosclerotic nonaneurysmal thoracic aorta. Top-normal caliber  main pulmonary artery (3.4 cm diameter). No central pulmonary  emboli.     Mediastinum/Nodes: No discrete thyroid nodules. Unremarkable  esophagus. No pathologically  enlarged axillary, mediastinal or hilar  lymph nodes.     Lungs/Pleura: No pneumothorax. No  pleural effusion. No acute  consolidative airspace disease, lung masses or significant pulmonary  nodules.     Musculoskeletal: No aggressive appearing focal osseous lesions.  Marked thoracic spondylosis.     CTA ABDOMEN AND PELVIS FINDINGS     Hepatobiliary: Normal liver with no liver mass. Normal gallbladder  with no radiopaque cholelithiasis. No biliary ductal dilatation.     Pancreas: Low-attenuation 1.3 cm focus in the pancreatic body  (series 15/image 116). No additional potential pancreatic lesions.  No pancreatic duct dilation.     Spleen: Normal size. No mass.     Adrenals/Urinary Tract: Right adrenal 1.1 cm nodule with density 49  HU, stable since 04/17/2003 CT, compatible with a benign adenoma. No  left adrenal nodules. No hydronephrosis. No contour deforming renal  masses. Normal bladder.     Stomach/Bowel: Normal non-distended stomach. Normal caliber small  bowel with no small bowel wall thickening. Appendectomy. Mild  sigmoid diverticulosis, with no large bowel wall thickening or  significant pericolonic fat stranding.     Vascular/Lymphatic: Atherosclerotic nonaneurysmal abdominal aorta.  No pathologically enlarged lymph nodes in the abdomen or pelvis.     Reproductive: Mild prostatomegaly.     Other: No pneumoperitoneum, ascites or focal fluid collection.     Musculoskeletal: No aggressive appearing focal osseous lesions.  Marked lumbar spondylosis.     VASCULAR MEASUREMENTS PERTINENT TO TAVR:     AORTA:     Minimal Aortic Diameter-14.1 x 13.2 mm     Severity of Aortic Calcification-moderate     RIGHT PELVIS:     Right Common Iliac Artery -     Minimal Diameter-10.9 x 10.7 mm     Tortuosity-mild     Calcification-mild     Right External Iliac Artery -     Minimal Diameter-8.4 x 8.3 mm      Tortuosity-moderate     Calcification-mild     Right Common Femoral Artery -     Minimal Diameter-8.4 x 7.8 mm     Tortuosity-mild     Calcification-mild     LEFT PELVIS:     Left Common Iliac Artery -     Minimal Diameter-11.4 x 9.6 mm     Tortuosity-mild to moderate     Calcification-mild     Left External Iliac Artery -     Minimal Diameter-9.7 x 8.4 mm     Tortuosity-mild     Calcification-mild     Left Common Femoral Artery -     Minimal Diameter-9.8 x 9.5 mm     Tortuosity-mild     Calcification-mild     Review of the MIP images confirms the above findings.     IMPRESSION:  1. Vascular findings and measurements pertinent to potential TAVR  procedure, as detailed.  2. Diffuse thickening and coarse calcification of the aortic valve,  compatible with the reported history of severe symptomatic aortic  stenosis.  3. Low-attenuation 1.3 cm focus in the pancreatic body. No biliary  or pancreatic duct dilation. MRI abdomen without and with IV  contrast recommended for further characterization.  4. Mild cardiomegaly.  5. Stable right adrenal adenoma.  6. Mild sigmoid diverticulosis.  7. Mild prostatomegaly.  8. Aortic Atherosclerosis (ICD10-I70.0).        Electronically Signed    By: Ilona Sorrel M.D.    On: 08/21/2019 12:33           STS Risk Score:  Risk of Mortality:  1.282%  Renal Failure:  1.925%  Permanent Stroke:  1.012%  Prolonged Ventilation:  6.057%  DSW Infection:  0.131%  Reoperation:  3.059%  Morbidity or Mortality:  10.783%  Short Length of Stay:  38.595%  Long Length of Stay:  4.535%     Impression:     This 79 year old gentleman has stage D, severe, symptomatic aortic stenosis with [name] class II symptoms of exertional fatigue and shortness of breath consistent with chronic diastolic congestive heart failure.  I  have personally reviewed his 2D echocardiogram, cardiac catheterization, and CTA studies.  Echocardiogram shows a severely calcified aortic valve with thickened leaflets and restricted mobility.  Aortic valve mean gradient is 44 mmHg consistent with severe aortic stenosis.  There is mild dilatation of the ascending aorta at 41 mm.  There is moderate calcification of the mitral valve leaflets with severe mitral annular calcification.  There is mild to moderate mitral regurgitation and moderate mitral valve stenosis with a mitral valve peak gradient of 22.7 mmHg and a mean gradient of 6 mmHg.  Cardiac catheterization shows no significant coronary disease.  The mean gradient across aortic valve was measured at 46 mmHg.  I agree that aortic valve replacement is indicated in this patient for relief of his symptoms and to prevent progressive left ventricular deterioration.  Given his age and comorbid risk factors I think transcatheter aortic valve replacement would be the best option for him.  His gated cardiac CTA shows anatomy suitable for transcatheter aortic valve replacement using a SAPIEN 3 valve.  His abdominal and pelvic CTA shows adequate pelvic vascular anatomy to allow transfemoral insertion.     The patient was counseled at length regarding treatment alternatives for management of severe symptomatic aortic stenosis. The risks and benefits of surgical intervention has been discussed in detail. Long-term prognosis with medical therapy was discussed. Alternative approaches such as conventional surgical aortic valve replacement, transcatheter aortic valve replacement, and palliative medical therapy were compared and contrasted at length. This discussion was placed in the context of the patient's own specific clinical presentation and past medical history. All of his questions have been addressed.      Following the decision to proceed with transcatheter aortic valve replacement, a discussion was held  regarding what types of management strategies would be attempted intraoperatively in the event of life-threatening complications, including whether or not the patient would be considered a candidate for the use of cardiopulmonary bypass and/or conversion to open sternotomy for attempted surgical intervention. The patient is aware of the fact that transient use of cardiopulmonary bypass may be necessary.  Given his low surgical risk I think he would be a candidate for emergent sternotomy to manage any intraoperative complications.  The patient has been advised of a variety of complications that might develop including but not limited to risks of death, stroke, paravalvular leak, aortic dissection or other major vascular complications, aortic annulus rupture, device embolization, cardiac rupture or perforation, mitral regurgitation, acute myocardial infarction, arrhythmia, heart block or bradycardia requiring permanent pacemaker placement, congestive heart failure, respiratory failure, renal failure, pneumonia, infection, other late complications related to structural valve deterioration or migration, or other complications that might ultimately cause a temporary or permanent loss of functional independence or other long term morbidity. The patient provides full informed consent for the procedure as described and all questions were answered.            Plan:     He is scheduled to have dental extractions tomorrow and will tentatively be scheduled for transfemoral TAVR on  Tuesday, 09/16/2019.        I spent 60 minutes performing this consultation and > 50% of this time was spent face to face counseling and coordinating the care of this patient's severe symptomatic aortic stenosis.        Gaye Pollack, MD  09/03/2019                     Electronically signed by Gaye Pollack, MD at 09/03/2019  9:59 PM

## 2019-09-04 NOTE — Anesthesia Procedure Notes (Signed)
Arterial Line Insertion Start/End6/06/2019 7:10 AM, 09/04/2019 7:15 AM Performed by: Inda Coke, CRNA, CRNA  Preanesthetic checklist: patient identified, IV checked, site marked, risks and benefits discussed, surgical consent, monitors and equipment checked, pre-op evaluation, timeout performed and anesthesia consent Lidocaine 1% used for infiltration Left, radial was placed Catheter size: 20 G Hand hygiene performed  and maximum sterile barriers used  Allen's test indicative of satisfactory collateral circulation Attempts: 1 Procedure performed without using ultrasound guided technique. Following insertion, dressing applied and Biopatch. Post procedure assessment: normal  Patient tolerated the procedure well with no immediate complications.

## 2019-09-04 NOTE — Transfer of Care (Signed)
Immediate Anesthesia Transfer of Care Note  Patient: Gregory [name]  Procedure(s) Performed: Extraction of tooth #'s 2,7,8,9,11,13,14 20,30, and 31 with alveoloplatsty and gross debridement of remaining teeth (N/A )  Patient Location: PACU  Anesthesia Type:General  Level of Consciousness: awake and alert   Airway & Oxygen Therapy: Patient Spontanous Breathing and Patient connected to face mask oxygen  Post-op Assessment: Report given to RN and Post -op Vital signs reviewed and stable  Post vital signs: Reviewed and stable  Last Vitals:  Vitals Value Taken Time  BP 104/50 09/04/19 0927  Temp    Pulse 114 09/04/19 0928  Resp 20 09/04/19 0928  SpO2 99 % 09/04/19 0928  Vitals shown include unvalidated device data.  Last Pain:  Vitals:   09/04/19 0645  PainSc: 0-No pain         Complications: No apparent anesthesia complications

## 2019-09-04 NOTE — Anesthesia Postprocedure Evaluation (Signed)
Anesthesia Post Note  Patient: DAMEK BRAZILE  Procedure(s) Performed: Extraction of tooth #'s 2,7,8,9,11,13,14 20,30, and 31 with alveoloplatsty and gross debridement of remaining teeth (N/A )     Patient location during evaluation: PACU Anesthesia Type: General Level of consciousness: awake and alert and oriented Pain management: pain level controlled Vital Signs Assessment: post-procedure vital signs reviewed and stable Respiratory status: spontaneous breathing, nonlabored ventilation and respiratory function stable Cardiovascular status: blood pressure returned to baseline and stable Postop Assessment: no apparent nausea or vomiting Anesthetic complications: no    Last Vitals:  Vitals:   09/04/19 0945 09/04/19 1000  BP: (!) 90/56 (!) 101/37  Pulse: 63 (!) 56  Resp: 19 (!) 24  Temp:    SpO2: 93% 95%    Last Pain:  Vitals:   09/04/19 1000  PainSc: 0-No pain                 Meliya Mcconahy A.

## 2019-09-09 ENCOUNTER — Other Ambulatory Visit: Payer: Self-pay

## 2019-09-09 DIAGNOSIS — I350 Nonrheumatic aortic (valve) stenosis: Secondary | ICD-10-CM

## 2019-09-11 NOTE — Pre-Procedure Instructions (Signed)
Your procedure is scheduled on Tuesday September 16, 2019 from 09:00 AM- 11:00 AM.  Report to [facility] Main Entrance "A" at 07:00 A.M., and check in at the Admitting office.  Call this number if you have problems the morning of surgery:  [phone]  Call [phone] if you have any questions prior to your surgery date Monday-Friday 8am-4pm.    Remember:  Do not eat or drink after midnight the night before your surgery.   Medication Instructions:      *Please continue taking all of your current medications through the day before surgery.  *Stop warfarin (COUMADIN) on 09/11/19. *Stop metFORMIN (GLUCOPHAGE-XR) on 09/14/19.   *DO NOT TAKE ANY medications on the morning of surgery.   HOW TO MANAGE YOUR DIABETES BEFORE AND AFTER SURGERY  Why is it important to control my blood sugar before and after surgery? . Improving blood sugar levels before and after surgery helps healing and can limit problems. . A way of improving blood sugar control is eating a healthy diet by: o  Eating less sugar and carbohydrates o  Increasing activity/exercise o  Talking with your doctor about reaching your blood sugar goals . High blood sugars (greater than 180 mg/dL) can raise your risk of infections and slow your recovery, so you will need to focus on controlling your diabetes during the weeks before surgery. . Make sure that the doctor who takes care of your diabetes knows about your planned surgery including the date and location.  How do I manage my blood sugar before surgery? . Check your blood sugar at least 4 times a day, starting 2 days before surgery, to make sure that the level is not too high or low. . Check your blood sugar the morning of your surgery when you wake up and every 2 hours until you get to the Short Stay unit. o If your blood sugar is less than 70 mg/dL, you will need to treat for low blood sugar: - Do not take insulin. - Treat a low blood sugar (less than 70 mg/dL) with  cup  of clear juice (cranberry or apple), 4 glucose tablets, OR glucose gel. - Recheck blood sugar in 15 minutes after treatment (to make sure it is greater than 70 mg/dL). If your blood sugar is not greater than 70 mg/dL on recheck, call [phone] for further instructions. . Report your blood sugar to the short stay nurse when you get to Short Stay.  . If you are admitted to the hospital after surgery: o Your blood sugar will be checked by the staff and you will probably be given insulin after surgery (instead of oral diabetes medicines) to make sure you have good blood sugar levels. o The goal for blood sugar control after surgery is 80-180 mg/dL.           The Morning of Surgery:            Do not wear jewelry.            Do not wear lotions, powders, colognes, or deodorant.            Men may shave face and neck.            Do not bring valuables to the hospital.            [facility] is not responsible for any belongings or valuables.  Do NOT Smoke (Tobacco/Vapping) or drink Alcohol 24 hours prior to your procedure.  If you use a  CPAP at night, you may bring all equipment for your overnight stay.   Contacts, glasses, dentures or bridgework may not be worn into surgery.      For patients admitted to the hospital, discharge time will be determined by your treatment team.   Patients discharged the day of surgery will not be allowed to drive home, and someone needs to stay with them for 24 hours.    Special instructions:   [place]- Preparing For Surgery  Before surgery, you can play an important role. Because skin is not sterile, your skin needs to be as free of germs as possible. You can reduce the number of germs on your skin by washing with CHG (chlorahexidine gluconate) Soap before surgery.  CHG is an antiseptic cleaner which kills germs and bonds with the skin to continue killing germs even after washing.    Oral Hygiene is also important to reduce your risk of infection.   Remember - BRUSH YOUR TEETH THE MORNING OF SURGERY WITH YOUR REGULAR TOOTHPASTE.  Please do not use if you have an allergy to CHG or antibacterial soaps. If your skin becomes reddened/irritated stop using the CHG.  Do not shave (including legs and underarms) for at least 48 hours prior to first CHG shower. It is OK to shave your face.  Please follow these instructions carefully.   1. Shower the NIGHT BEFORE SURGERY and the MORNING OF SURGERY with CHG Soap.   2. If you chose to wash your hair, wash your hair first as usual with your normal shampoo.  3. After you shampoo, rinse your hair and body thoroughly to remove the shampoo.  4. Use CHG as you would any other liquid soap. You can apply CHG directly to the skin and wash gently with a scrungie or a clean washcloth.   5. Apply the CHG Soap to your body ONLY FROM THE NECK DOWN.  Do not use on open wounds or open sores. Avoid contact with your eyes, ears, mouth and genitals (private parts). Wash Face and genitals (private parts)  with your normal soap.   6. Wash thoroughly, paying special attention to the area where your surgery will be performed.  7. Thoroughly rinse your body with warm water from the neck down.  8. DO NOT shower/wash with your normal soap after using and rinsing off the CHG Soap.  9. Pat yourself dry with a CLEAN TOWEL.  10. Wear CLEAN PAJAMAS to bed the night before surgery, wear comfortable clothes the morning of surgery  11. Place CLEAN SHEETS on your bed the night of your first shower and DO NOT SLEEP WITH PETS.   Day of Surgery: Shower with CHG Soap.  Do not apply any deodorants/lotions.  Please wear clean clothes to the hospital/surgery center.   Remember to brush your teeth WITH YOUR REGULAR TOOTHPASTE.   Please read over the following fact sheets that you were given.

## 2019-09-12 ENCOUNTER — Other Ambulatory Visit (HOSPITAL_COMMUNITY)
Admission: RE | Admit: 2019-09-12 | Discharge: 2019-09-12 | Disposition: A | Discharge status: Home / Self Care | Payer: PPO | Source: Ambulatory Visit | Attending: Cardiovascular Disease | Admitting: Cardiovascular Disease

## 2019-09-12 ENCOUNTER — Ambulatory Visit (HOSPITAL_COMMUNITY)
Admission: RE | Admit: 2019-09-12 | Discharge: 2019-09-12 | Disposition: A | Discharge status: Home / Self Care | Payer: PPO | Source: Ambulatory Visit | Attending: Cardiovascular Disease | Admitting: Cardiovascular Disease

## 2019-09-12 ENCOUNTER — Encounter (HOSPITAL_COMMUNITY): Payer: Self-pay

## 2019-09-12 ENCOUNTER — Other Ambulatory Visit: Payer: Self-pay

## 2019-09-12 ENCOUNTER — Encounter (HOSPITAL_COMMUNITY)
Admission: RE | Admit: 2019-09-12 | Discharge: 2019-09-12 | Disposition: A | Discharge status: Home / Self Care | Payer: PPO | Source: Ambulatory Visit | Attending: Cardiovascular Disease | Admitting: Cardiovascular Disease

## 2019-09-12 DIAGNOSIS — Z01818 Encounter for other preprocedural examination: Secondary | ICD-10-CM | POA: Diagnosis not present

## 2019-09-12 DIAGNOSIS — Z20822 Contact with and (suspected) exposure to covid-19: Secondary | ICD-10-CM | POA: Diagnosis not present

## 2019-09-12 DIAGNOSIS — I350 Nonrheumatic aortic (valve) stenosis: Secondary | ICD-10-CM

## 2019-09-12 DIAGNOSIS — I700 Atherosclerosis of aorta: Secondary | ICD-10-CM | POA: Diagnosis not present

## 2019-09-12 DIAGNOSIS — I4510 Unspecified right bundle-branch block: Secondary | ICD-10-CM | POA: Diagnosis not present

## 2019-09-12 DIAGNOSIS — R9431 Abnormal electrocardiogram [ECG] [EKG]: Secondary | ICD-10-CM | POA: Insufficient documentation

## 2019-09-12 DIAGNOSIS — I509 Heart failure, unspecified: Secondary | ICD-10-CM | POA: Diagnosis not present

## 2019-09-12 DIAGNOSIS — C8590 Non-Hodgkin lymphoma, unspecified, unspecified site: Secondary | ICD-10-CM | POA: Diagnosis not present

## 2019-09-12 DIAGNOSIS — I4891 Unspecified atrial fibrillation: Secondary | ICD-10-CM | POA: Diagnosis not present

## 2019-09-12 LAB — URINALYSIS, ROUTINE W REFLEX MICROSCOPIC
Bacteria, UA: NONE SEEN
Bilirubin Urine: NEGATIVE
Glucose, UA: NEGATIVE mg/dL
Hgb urine dipstick: NEGATIVE
Ketones, ur: NEGATIVE mg/dL
Leukocytes,Ua: NEGATIVE
Nitrite: NEGATIVE
Protein, ur: 30 mg/dL — AB
Specific Gravity, Urine: 1.006 (ref 1.005–1.030)
pH: 7 (ref 5.0–8.0)

## 2019-09-12 LAB — BLOOD GAS, ARTERIAL
Acid-base deficit: 1.3 mmol/L (ref 0.0–2.0)
Bicarbonate: 22.5 mmol/L (ref 20.0–28.0)
FIO2: 21
O2 Saturation: 98.6 %
Patient temperature: 37
pCO2 arterial: 35.2 mmHg (ref 32.0–48.0)
pH, Arterial: 7.421 (ref 7.350–7.450)
pO2, Arterial: 127 mmHg — ABNORMAL HIGH (ref 83.0–108.0)

## 2019-09-12 LAB — CBC
HCT: 40.7 % (ref 39.0–52.0)
Hemoglobin: 13.1 g/dL (ref 13.0–17.0)
MCH: 29.3 pg (ref 26.0–34.0)
MCHC: 32.2 g/dL (ref 30.0–36.0)
MCV: 91.1 fL (ref 80.0–100.0)
Platelets: 199 10*3/uL (ref 150–400)
RBC: 4.47 MIL/uL (ref 4.22–5.81)
RDW: 14.6 % (ref 11.5–15.5)
WBC: 9.8 10*3/uL (ref 4.0–10.5)
nRBC: 0 % (ref 0.0–0.2)

## 2019-09-12 LAB — COMPREHENSIVE METABOLIC PANEL
ALT: 24 U/L (ref 0–44)
AST: 29 U/L (ref 15–41)
Albumin: 3.9 g/dL (ref 3.5–5.0)
Alkaline Phosphatase: 78 U/L (ref 38–126)
Anion gap: 11 (ref 5–15)
BUN: 11 mg/dL (ref 8–23)
CO2: 19 mmol/L — ABNORMAL LOW (ref 22–32)
Calcium: 8.9 mg/dL (ref 8.9–10.3)
Chloride: 104 mmol/L (ref 98–111)
Creatinine, Ser: 0.98 mg/dL (ref 0.61–1.24)
GFR calc Af Amer: >60 mL/min (ref >60)
GFR calc non Af Amer: >60 mL/min (ref >60)
Glucose, Bld: 136 mg/dL — ABNORMAL HIGH (ref 70–99)
Potassium: 4.7 mmol/L (ref 3.5–5.1)
Sodium: 134 mmol/L — ABNORMAL LOW (ref 135–145)
Total Bilirubin: 1 mg/dL (ref 0.3–1.2)
Total Protein: 6.7 g/dL (ref 6.5–8.1)

## 2019-09-12 LAB — ABO/RH: ABO/RH(D): O NEG

## 2019-09-12 LAB — TYPE AND SCREEN
ABO/RH(D): O NEG
Antibody Screen: NEGATIVE

## 2019-09-12 LAB — GLUCOSE, CAPILLARY: Glucose-Capillary: 131 mg/dL — ABNORMAL HIGH (ref 70–99)

## 2019-09-12 LAB — HEMOGLOBIN A1C
Hgb A1c MFr Bld: 7.2 % — ABNORMAL HIGH (ref 4.8–5.6)
Mean Plasma Glucose: 159.94 mg/dL

## 2019-09-12 LAB — SURGICAL PCR SCREEN
MRSA, PCR: NEGATIVE
Staphylococcus aureus: NEGATIVE

## 2019-09-12 LAB — SARS CORONAVIRUS 2 (TAT 6-24 HRS): SARS Coronavirus 2: NEGATIVE

## 2019-09-12 LAB — BRAIN NATRIURETIC PEPTIDE: B Natriuretic Peptide: 295.1 pg/mL — ABNORMAL HIGH (ref 0.0–100.0)

## 2019-09-12 NOTE — Progress Notes (Signed)
PCP:  Kathyrn Lass, MD Cardiologist:  Larae Grooms, MD  EKG:  09/12/19 CXR:  09/12/19 ECHO:  07/15/19 Stress Test: 10/26/06 Cardiac Cath: 08/12/19  Fasting Blood Sugar-  130-160 Checks Blood Sugar_1__ times a day  Covid 09/12/19  Anesthesia Review:  Yes, cardiac history.  Last dose coumadin 09/11/19.  Patient denies shortness of breath, fever, cough, and chest pain at PAT appointment.  Patient verbalized understanding of instructions provided today at the PAT appointment.  Patient asked to review instructions at home and day of surgery.

## 2019-09-15 ENCOUNTER — Ambulatory Visit (HOSPITAL_COMMUNITY): Payer: Dental | Admitting: Dentistry

## 2019-09-15 ENCOUNTER — Other Ambulatory Visit: Payer: Self-pay

## 2019-09-15 ENCOUNTER — Encounter (HOSPITAL_COMMUNITY): Payer: Self-pay | Admitting: Dentistry

## 2019-09-15 VITALS — BP 125/64 | HR 61 | Temp 98.2°F

## 2019-09-15 DIAGNOSIS — K08199 Complete loss of teeth due to other specified cause, unspecified class: Secondary | ICD-10-CM

## 2019-09-15 DIAGNOSIS — I350 Nonrheumatic aortic (valve) stenosis: Secondary | ICD-10-CM

## 2019-09-15 DIAGNOSIS — Z01818 Encounter for other preprocedural examination: Secondary | ICD-10-CM

## 2019-09-15 MED ORDER — SODIUM CHLORIDE 0.9 % IV SOLN
INTRAVENOUS | Status: DC
  Filled 2019-09-15: qty 30

## 2019-09-15 MED ORDER — VANCOMYCIN HCL 1500 MG/300ML IV SOLN
1500.0000 mg | INTRAVENOUS | Status: AC
  Administered 2019-09-16: 1500 mg via INTRAVENOUS
  Filled 2019-09-15 (×2): qty 300

## 2019-09-15 MED ORDER — SODIUM CHLORIDE 0.9 % IV SOLN
1.5000 g | INTRAVENOUS | Status: AC
  Administered 2019-09-16: 1.5 g via INTRAVENOUS
  Filled 2019-09-15: qty 1.5

## 2019-09-15 MED ORDER — DEXMEDETOMIDINE HCL IN NACL 400 MCG/100ML IV SOLN
0.1000 ug/kg/h | INTRAVENOUS | Status: AC
  Administered 2019-09-16: 1 ug/kg/h via INTRAVENOUS
  Administered 2019-09-16: 114.6 ug via INTRAVENOUS
  Filled 2019-09-15: qty 100

## 2019-09-15 MED ORDER — MAGNESIUM SULFATE 50 % IJ SOLN
40.0000 meq | INTRAMUSCULAR | Status: DC
  Filled 2019-09-15: qty 9.85

## 2019-09-15 MED ORDER — POTASSIUM CHLORIDE 2 MEQ/ML IV SOLN
80.0000 meq | INTRAVENOUS | Status: DC
  Filled 2019-09-15: qty 40

## 2019-09-15 MED ORDER — NOREPINEPHRINE 4 MG/250ML-% IV SOLN
0.0000 ug/min | INTRAVENOUS | Status: AC
  Administered 2019-09-16: 2 ug/min via INTRAVENOUS
  Filled 2019-09-15: qty 250

## 2019-09-15 NOTE — Progress Notes (Signed)
POST OPERATIVE NOTE:  09/15/2019 Gregory [name] [id number]  COVID 19 SCREENING: The patient does not symptoms concerning for COVID-19 infection (Including fever, chills, cough, or new SHORTNESS OF BREATH).    VITALS: BP 125/64 (BP Location: Right Arm)   Pulse 61   Temp 98.2 F (36.8 C)   LABS:  Lab Results  Component Value Date   WBC 9.8 09/12/2019   HGB 13.1 09/12/2019   HCT 40.7 09/12/2019   MCV 91.1 09/12/2019   PLT 199 09/12/2019   BMET    Component Value Date/Time   NA 134 (L) 09/12/2019 1000   NA 140 07/28/2019 1508   K 4.7 09/12/2019 1000   CL 104 09/12/2019 1000   CO2 19 (L) 09/12/2019 1000   GLUCOSE 136 (H) 09/12/2019 1000   BUN 11 09/12/2019 1000   BUN 20 07/28/2019 1508   CREATININE 0.98 09/12/2019 1000   CALCIUM 8.9 09/12/2019 1000   GFRNONAA >60 09/12/2019 1000   GFRAA >60 09/12/2019 1000    Lab Results  Component Value Date   INR 1.2 09/04/2019   INR 1.2 08/12/2019   INR 2.3 (H) 07/28/2019   No results found for: PTT   Gregory [name] is status post extraction of multiple teeth with alveoloplasty in the operating room with general anesthesia on 09/04/19.  SUBJECTIVE: Patient with minimal discomfort.  Patient indicates that he does have significant and for oral and extraoral bruising.  Patient does have stitches that remain.  Patient has not had to use more than 2 tablets of pain medication.  Patient has been using a soft diet.  EXAM: There is no sign of infection, heme, or ooze.  Sutures are loosely intact.  Patient is healing in by generalized primary closure with several areas healing in by secondary intention.  Patient appears to have some delayed healing most likely secondary to his diabetes.  PROCEDURE: The patient was given a chlorhexidine gluconate rinse for 30 seconds. Sutures were then removed without complication. Patient tolerated the procedure well.  ASSESSMENT: Post operative course is consistent with dental procedures performed in  the operating room and general anesthesia Loss of teeth due to extraction Questionable delayed healing secondary to diabetes mellitus.   PLAN: 1.  Continue salt water rinses as needed to aid healing. 2.  Continue to advance diet as tolerated.  Patient indicated that he tried Glucerna but did not like the taste.  Patient was given alternative sources of protein to include scrambled eggs, peanut butter, Greek yogurt, and canned chicken once he has healed more.  Patient expresses understanding. 3.  Patient is to return to clinic for evaluation of healing in 1 month.  Patient will then follow-up with a dentist of his choice for fabrication of upper complete denture and lower partial denture as indicated after adequate healing and once medically stable from anticipated heart valve surgery. 4.  Patient is currently cleared for TAVR procedure tomorrow.   Lenn Cal, DDS

## 2019-09-15 NOTE — Progress Notes (Signed)
Anesthesia Chart Review:  Case: [id number] Date/Time: 09/16/19 0900   Procedures:      TRANSCATHETER AORTIC VALVE REPLACEMENT, TRANSFEMORAL (N/A )     TRANSESOPHAGEAL ECHOCARDIOGRAM (TEE) (N/A )   Anesthesia type: General   Pre-op diagnosis: aortic stenosis   Location: [facility] CATH [facility] 6 / [place] [facility]   Providers: Sherren Mocha, MD    - Gilford Raid, MD is CT surgeon  DISCUSSION:Patient is a 79 year old male scheduled for the above procedure. He is s/p multiple teeth extractions on 09/04/19.   History includes never smoker, severe aortic stenosis, mitral valve disease (mild-moderate MR, moderate MS 07/15/19), chronic diastolic CHF (acute, 0/1751), persistent afib (s/p DCCV 01/06/09), HTN, DM2, Non-Hodgkin's Lymphoma (diagnosed 04/2003, s/p chemotherapy), right finger amputations (4th, 5th). BMI is consistent with obesity.   At his PAT visit earlier this month, he reported left nares obstruction/narrowing since childhood and had intermittent epistaxis, right nares > left.  He reported ongoing issues with occasional blood tinged nasal mucous, but last significant nose bleed about one year ago. He denied obstructive symptoms with his right nares.   Last warfarin 09/11/19. He is for PT/PTT on the day of surgery.  09/12/2019 presurgical COVID-19 test negative. Anesthesia team to evaluate on the day of surgery.    VS: BP 135/67, HR 74, O2 sat 98%   PROVIDERS: Kathyrn Lass, MD is PCP  - Larae Grooms, MD is primary Cardiologist - Lauree Chandler, MD is Structural Heart Cardiologist (for TAVR evaluation) - Betsy Coder, MD is HEM-ONC. Last visit 05/06/13. He was In clinical remission of non-hodgkin's lymphoma at that time with "a good prognosis for a long-term disease-free survival." He was discharged from the oncology clinic with on-going primary care follow-up.     LABS: 09/12/19 PAT lab results included: Lab Results  Component Value Date   WBC 9.8 09/12/2019   HGB 13.1  09/12/2019   HCT 40.7 09/12/2019   PLT 199 09/12/2019   GLUCOSE 136 (H) 09/12/2019   ALT 24 09/12/2019   AST 29 09/12/2019   NA 134 (L) 09/12/2019   K 4.7 09/12/2019   CL 104 09/12/2019   CREATININE 0.98 09/12/2019   BUN 11 09/12/2019   CO2 19 (L) 09/12/2019   INR 1.2 09/04/2019   HGBA1C 7.2 (H) 09/12/2019      IMAGES: CXR 09/1119: FINDINGS: Enlargement of cardiac silhouette. Mediastinal contours and pulmonary vascularity normal. Atherosclerotic calcification aorta. Lungs clear. No pulmonary infiltrate, pleural effusion or pneumothorax. Slight interstitial prominence seen on previous exam resolved. Minimal endplate spur formation thoracic spine. IMPRESSION: Enlargement of cardiac silhouette. No acute abnormalities. Aortic Atherosclerosis (ICD10-I70.0).  CTA chest/abd/pelvis 08/21/19 (ordered by Dr. Angelena Form): IMPRESSION: 1. Vascular findings and measurements pertinent to potential TAVR procedure, as detailed. 2. Diffuse thickening and coarse calcification of the aortic valve, compatible with the reported history of severe symptomatic aortic stenosis. 3. Low-attenuation 1.3 cm focus in the pancreatic body. No biliary or pancreatic duct dilation. MRI abdomen without and with IV contrast recommended for further characterization. 4. Mild cardiomegaly. 5. Stable right adrenal adenoma. 6. Mild sigmoid diverticulosis. 7. Mild prostatomegaly. 8. Aortic Atherosclerosis (ICD10-I70.0).   EKG: 09/12/19: Atrial fibrillation at 77 bpm Right bundle branch block T wave abnormality, consider inferior ischemia Abnormal ECG Since last tracing inferior TWI Confirmed by Buford Dresser [phone]) on 09/13/2019 4:21:24 PM   CV: CT Coronary 08/21/19: IMPRESSION: 1. Trileaflet aortic valve with severe calcifications (calcium score 6709) 2. Aortic annulus measures 80mm x 62mm with perimeter 31mm and  area 550 mm^2. There is moderate annular calcification adjacent to  left coronary cusp, which extends into LVOT. Annular measurements suitable for delivery of 45mm Edwards-Sapient 3 valve 2. Sufficient coronary to annulus distance, measuring 60mm to left main and 38mm to RCA 3. Optimum Fluoroscopic Angle for Delivery: LAO 7 CRA 4 4. Coronary calcium score 2772 (90th percentile)   Carotid US 08/21/19: Summary:  Right Carotid: Velocities in the right ICA are consistent with a 1-39%  stenosis.  Left Carotid: Velocities in the left ICA are consistent with a 1-39%  stenosis.  Vertebrals: Bilateral vertebral arteries demonstrate antegrade flow.    Cardiac cath 08/12/19:  The left ventricular systolic function is normal.  LV end diastolic pressure is normal.  The left ventricular ejection fraction is 55-65% by visual estimate.  There is severe aortic valve stenosis. Mean gradient 46 mm Hg.  There is mild mitral valve stenosis. Mitral valve area 2.1 cm2. Mean gradient 11 mm Hg.  Hemodynamic findings consistent with mild pulmonary hypertension.  No significant CAD. No significant CAD. Plan for TAVR w/u.    Echo 07/15/19: IMPRESSIONS  1. Left ventricular ejection fraction, by estimation, is 55%. The left  ventricle has normal function. Left ventricular endocardial border not  optimally defined to evaluate regional wall motion. Left ventricular  diastolic parameters are indeterminate.  Elevated left ventricular end-diastolic pressure. There is septal shudder  with mild respiratory related septal shift. Study was not protocoled to  assess for constrictive physiology.  2. Right ventricular systolic function is normal. The right ventricular  size is normal. There is severely elevated pulmonary artery systolic  pressure. The estimated right ventricular systolic pressure is 34.7 mmHg.  3. Left atrial size was severely dilated.  4. Right atrial size was severely dilated.  5. Calcification of the aortomitral continuity with severe aortic  valve  stenosis and moderate mitral valve stesnosis. Findings suggestive of  radiation heart disease.  6. The mitral valve is abnormal. Mild to moderate mitral valve  regurgitation. Moderate mitral stenosis. The mean mitral valve gradient is  6.0 mmHg with average heart rate of 74 bpm.  7. The aortic valve is abnormal. Aortic valve regurgitation is not  visualized. Severe aortic valve stenosis. Aortic valve mean gradient  measures 44.0 mmHg.  AV Area (Vmax):  0.68 cm  AV Area (Vmean):  0.64 cm  AV Area (VTI):   0.69 cm  AV Vmax:      432.00 cm/s  AV Vmean:     303.000 cm/s  AV VTI:      1.100 m  AV Peak Grad:   74.6 mmHg  AV Mean Grad:   44.0 mmHg  LVOT Vmax:     70.20 cm/s  LVOT Vmean:    46.800 cm/s  LVOT VTI:     0.182 m  LVOT/AV VTI ratio: 0.17  8. Aortic dilatation noted. There is mild dilatation of the ascending  aorta measuring 41 mm.  9. The inferior vena cava is dilated in size with <50% respiratory  variability, suggesting right atrial pressure of 15 mmHg.  - Comparison(s): A prior study was performed on 05/30/2018. Comparison made  by report only. No significant change in biventricular function, aortic  stenosis or mitral valve stenosis.    Past Medical History:  Diagnosis Date  . 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 Arthritis   . Cancer ([place])   . Chronic atrial fibrillation ([place])   . Chronic diastolic CHF (congestive  heart failure) ([place])   . Diabetes mellitus    Type II  . History of blood transfusion    5 pints for peptic ulcer  . History of kidney stones    Passed x 2  . Hypertension   . Mitral valve disease    mild-moderate MR, moderate MS 07/15/19 echo  . 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 Ruptured disk   . Severe aortic stenosis   . Ulcer    peptic Ulcer - bleeding    Past Surgical History:  Procedure  Laterality Date  . APPENDECTOMY  1967  . COLONOSCOPY    . left knee surgery    . left shoulder surgery    . MULTIPLE EXTRACTIONS WITH ALVEOLOPLASTY N/A 09/04/2019   Procedure: Extraction of tooth #'s 2,7,8,9,11,13,14 20,30, and 31 with alveoloplatsty and gross debridement of remaining teeth;  Surgeon: Lenn Cal, DDS;  Location: [facility];  Service: Oral Surgery;  Laterality: N/A;  . RIGHT/LEFT HEART CATH AND CORONARY ANGIOGRAPHY N/A 08/12/2019   Procedure: RIGHT/LEFT HEART CATH AND CORONARY ANGIOGRAPHY;  Surgeon: Jettie Booze, MD;  Location: [facility];  Service: Cardiovascular;  Laterality: N/A;  . TONSILLECTOMY      MEDICATIONS: . [START ON 09/16/2019] cefUROXime (ZINACEF) 1.5 g in sodium chloride 0.9 % 100 mL IVPB  . [START ON 09/16/2019] dexmedetomidine (PRECEDEX) 400 MCG/100ML (4 mcg/mL) infusion  . [START ON 09/16/2019] heparin 30,000 units/NS 1000 mL solution for CELLSAVER  . [START ON 09/16/2019] magnesium sulfate (IV Push/IM) injection 40 mEq  . [START ON 09/16/2019] norepinephrine (LEVOPHED) 4mg  in 256mL premix infusion  . [START ON 09/16/2019] potassium chloride injection 80 mEq  . [START ON 09/16/2019] vancomycin (VANCOREADY) IVPB 1500 mg/300 mL   . acetaminophen (TYLENOL) 650 MG CR tablet  . atorvastatin (LIPITOR) 20 MG tablet  . diltiazem (CARDIZEM CD) 120 MG 24 hr capsule  . erythromycin ophthalmic ointment  . HYDROcodone-acetaminophen (NORCO) 5-325 MG tablet  . lisinopril (PRINIVIL,ZESTRIL) 10 MG tablet  . metFORMIN (GLUCOPHAGE-XR) 500 MG 24 hr tablet  . metoprolol succinate (TOPROL-XL) 100 MG 24 hr tablet  . Multiple Vitamins-Minerals (PRESERVISION AREDS 2 PO)  . ONE TOUCH ULTRA TEST test strip  . ONETOUCH DELICA LANCETS 16X MISC  . pioglitazone (ACTOS) 15 MG tablet  . warfarin (COUMADIN) 6 MG tablet    Myra Gianotti, PA-C Surgical Short Stay/Anesthesiology [facility] Phone [phone] [facility] Phone [phone] 09/15/2019 10:36 AM

## 2019-09-15 NOTE — Anesthesia Preprocedure Evaluation (Addendum)
Anesthesia Evaluation  Patient identified by MRN, date of birth, ID band Patient awake    Reviewed: Allergy & Precautions, NPO status , Patient's Chart, lab work & pertinent test results  History of Anesthesia Complications Negative for: history of anesthetic complications  Airway Mallampati: I  TM Distance: >3 FB Neck ROM: Full    Dental  (+) Edentulous Upper, Dental Advisory Given   Pulmonary neg pulmonary ROS,  09/12/2019 SARS coronavirus NEG   breath sounds clear to auscultation       Cardiovascular hypertension, Pt. on medications (-) angina(-) CAD + dysrhythmias Atrial Fibrillation + Valvular Problems/Murmurs (severe AS, mild-mod MR with mod MS) MR  Rhythm:Regular Rate:Normal + Systolic murmurs and + Diastolic murmurs 12/9240 ECHO; EF 55%, severe AS with Aortic valve mean gradient 44.0 mmHg, Severe mitral annular  Calcification, Mild to moderate mitral valve regurgitation.  Moderate mitral valve stenosis. MV peak gradient, 22.7 mmHg. The mean mitral valve gradient is 6.0 mmHg with average heart rate of 74 bpm.    Neuro/Psych negative neurological ROS     GI/Hepatic negative GI ROS, Neg liver ROS,   Endo/Other  diabetes (glu 129), Oral Hypoglycemic AgentsMorbid obesity  Renal/GU negative Renal ROS     Musculoskeletal   Abdominal (+) + obese,   Peds  Hematology Coumadin Non-Hodgkin's lymphoma   Anesthesia Other Findings   Reproductive/Obstetrics                           Anesthesia Physical Anesthesia Plan  ASA: III  Anesthesia Plan: MAC   Post-op Pain Management:    Induction:   PONV Risk Score and Plan: 1 and Treatment may vary due to age or medical condition  Airway Management Planned: Natural Airway and Simple Face Mask  Additional Equipment: Arterial line  Intra-op Plan:   Post-operative Plan:   Informed Consent: I have reviewed the patients History and Physical, chart,  labs and discussed the procedure including the risks, benefits and alternatives for the proposed anesthesia with the patient or authorized representative who has indicated his/her understanding and acceptance.     Dental advisory given  Plan Discussed with: CRNA and Surgeon  Anesthesia Plan Comments: (PAT note written 09/15/2019 by Myra Gianotti, PA-C. )     Anesthesia Quick Evaluation

## 2019-09-15 NOTE — H&P (Signed)
[address]       [place] [address]             [id number]      Cardiothoracic Surgery Admission History and Physical   Referring Provider is Jettie Booze, MD  Primary Cardiologist is Larae Grooms, MD  PCP is Kathyrn Lass, MD      Chief Complaint  Patient presents with  . Aortic Stenosis       HPI:  The patient is a 79 year old gentleman with a history of atrial fibrillation on Coumadin, chronic diastolic congestive heart failure mitral regurgitation, diabetes, hypertension, non-Hodgkin's lymphoma, and severe aortic stenosis who is being evaluated for aortic valve replacement. He had an echocardiogram in 2017 that showed a mean gradient of 28 mmHg and a repeat echo in February 2020 which showed an increase in the mean gradient to 45 mmHg. His left ventricular ejection fraction had decreased to 55 to 60% on his echocardiogram in February 2020. His most recent echocardiogram on 07/15/2019 showed an aortic valve mean gradient of 44 mmHg. Aortic valve area was measured at 0.69 cm and dimensionless index was 0.17. Left ventricular ejection fraction was unchanged at 55%. He subsequently underwent cardiac catheterization this month showing no significant coronary disease.  He has noted some exertional fatigue and shortness of breath but is limited in his activity due to his severe right knee arthritis with bone-on-bone. He denies any chest pain or pressure. He has had no orthopnea. He denies peripheral edema. He underwent extraction of multiple teeth by Dr. Enrique Sack on 09/04/19 and did well. He had a follow up visit today and is cleared for TAVR. He lives alone in [place]. He is a retired Administrator.       Past Medical History:  Diagnosis Date  . 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 Aortic stenosis   . Arthritis   . Atrial fibrillation ([place])   . Cancer ([place])   . Chronic diastolic CHF (congestive heart failure) ([place])   .  Diabetes mellitus    Type II  . Dysrhythmia    Afib  . History of blood transfusion    5 pints for peptic ulcer  . History of kidney stones    Passed x 2  . Hypertension   . Mitral valve disease    mild-moderate MR, moderate MS 07/15/19 echo  . 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 Ruptured disk   . Ulcer    peptic Ulcer - bleeding        Past Surgical History:  Procedure Laterality Date  . APPENDECTOMY  1967  . COLONOSCOPY    . left knee surgery    . left shoulder surgery    . RIGHT/LEFT HEART CATH AND CORONARY ANGIOGRAPHY N/A 08/12/2019   Procedure: RIGHT/LEFT HEART CATH AND CORONARY ANGIOGRAPHY; Surgeon: Jettie Booze, MD; Location: [facility]; Service: Cardiovascular; Laterality: N/A;  . TONSILLECTOMY          Family History  Problem Relation Age of Onset  . Hypertension Father   . Heart attack Father   . Heart attack Mother   . Cancer Mother    LUNG  . Stroke Neg Hx    Social History        Socioeconomic History  . Marital status: Single    Spouse name: Not on file  .  Number of children: 0  . Years of education: Not on file  . Highest education level: Not on file  Occupational History  . Occupation: Retired-Trucking  Tobacco Use  . Smoking status: Never Smoker  . Smokeless tobacco: Never Used  Substance and Sexual Activity  . Alcohol use: No  . Drug use: No  . Sexual activity: Never  Other Topics Concern  . Not on file  Social History Narrative  . Not on file   Social Determinants of Health      Financial Resource Strain:   . Difficulty of Paying Living Expenses:   Food Insecurity:   . Worried About Charity fundraiser in the Last Year:   . Arboriculturist in the Last Year:   Transportation Needs:   . Film/video editor (Medical):   Marland Kitchen Lack of Transportation (Non-Medical):   Physical Activity:   . Days of Exercise per Week:   . Minutes of Exercise per  Session:   Stress:   . Feeling of Stress :   Social Connections:   . Frequency of Communication with Friends and Family:   . Frequency of Social Gatherings with Friends and Family:   . Attends Religious Services:   . Active Member of Clubs or Organizations:   . Attends Archivist Meetings:   Marland Kitchen Marital Status:   Intimate Partner Violence:   . Fear of Current or Ex-Partner:   . Emotionally Abused:   Marland Kitchen Physically Abused:   . Sexually Abused:          Current Outpatient Medications  Medication Sig Dispense Refill  . acetaminophen (TYLENOL) 650 MG CR tablet Take 1,300 mg by mouth every 8 (eight) hours as needed for pain.    Marland Kitchen atorvastatin (LIPITOR) 20 MG tablet Take 20 mg by mouth daily at 6 PM.     . diltiazem (CARDIZEM CD) 120 MG 24 hr capsule Take 120 mg by mouth daily at 6 PM.    . erythromycin ophthalmic ointment Place 1 application into the left eye 3 (three) times daily. Place ointment to the lower area of the    . lisinopril (PRINIVIL,ZESTRIL) 10 MG tablet Take 10 mg by mouth daily.    . metFORMIN (GLUCOPHAGE-XR) 500 MG 24 hr tablet Take 1 tablet (500 mg total) by mouth 2 (two) times daily. (Patient taking differently: Take 1,000 mg by mouth 2 (two) times daily. )    . metoprolol succinate (TOPROL-XL) 100 MG 24 hr tablet Take 100 mg by mouth daily. Take with or immediately following a meal.    . Multiple Vitamins-Minerals (PRESERVISION AREDS 2 PO) Take 1 tablet by mouth daily.    . ONE TOUCH ULTRA TEST test strip 1 each by Other route as needed (BLOOD SUGAR).     Glory Rosebush DELICA LANCETS 28B MISC Apply 1 each topically daily as needed (BLOOD SUGAR).     Marland Kitchen pioglitazone (ACTOS) 15 MG tablet Take 15 mg by mouth daily.     Marland Kitchen warfarin (COUMADIN) 6 MG tablet Take 3-6 mg by mouth See admin instructions. Take 0.5 tablet (3 mg) by mouth on Thursdays & Saturdays & take 1 tablet (6 mg) by mouth on Sundays, Mondays, Tuesdays, Wednesdays, & Fridays at night.     No current  facility-administered medications for this visit.            Facility-Administered Medications Ordered in Other Visits  Medication Dose Route Frequency Provider Last Rate Last Admin  . [START ON 09/04/2019]  ceFAZolin (ANCEF) 3 g in dextrose 5 % 50 mL IVPB 3 g Intravenous To [facility] Lenn Cal, DDS          Allergies  Allergen Reactions  . Macrobid [Nitrofurantoin]   . Nystatin   . Prevnar 13 [Pneumococcal 13-Val Conj Vacc]   . Bisoprolol-Hydrochlorothiazide Other (See Comments)    "Severe" arthralgias and myalgias  . Nsaids Other (See Comments)    GI upset, Peptic ulcer disease   Review of Systems:   General: Normal appetite, + decreased energy, no weight gain, no weight loss, no fever  Cardiac: no chest pain with exertion, no chest pain at rest, +SOB with exertion, no resting SOB, no PND, no orthopnea, no palpitations, no arrhythmia, no atrial fibrillation, no LE edema, no dizzy spells, no syncope  Respiratory: + exertional shortness of breath, no home oxygen, no productive cough, no dry cough, no bronchitis, no wheezing, no hemoptysis, no asthma, no pain with inspiration or cough, no sleep apnea, no CPAP at night  GI: no difficulty swallowing, no reflux, no frequent heartburn, no hiatal hernia, no abdominal pain, no constipation, no diarrhea, no hematochezia, no hematemesis, no melena  GU: no dysuria, no frequency, no urinary tract infection, no hematuria, no enlarged prostate, no kidney stones, no kidney disease  Vascular: no pain suggestive of claudication, no pain in feet, no leg cramps, no varicose veins, no DVT, no non-healing foot ulcer  Neuro: no stroke, no TIA's, no seizures, no headaches, no temporary blindness one eye, no slurred speech, no peripheral neuropathy, no chronic pain, no instability of gait, no memory/cognitive dysfunction  Musculoskeletal: + arthritis, + joint swelling right knee, no myalgias, + difficulty walking due to right knee, Normal mobility  Skin: no  rash, no itching, no skin infections, no pressure sores or ulcerations  Psych: no anxiety, no depression, no nervousness, no unusual recent stress  Eyes: no blurry vision, no floaters, no recent vision changes, does not wear glasses or contacts  ENT: no hearing loss, + loose or painful teeth, + partial dentures Hematologic: no easy bruising, no abnormal bleeding, no clotting disorder, no frequent epistaxis  Endocrine: + diabetes, does check CBG's at home    Physical Exam:   BP 121/80  Pulse 67  Temp 97.6 F (36.4 C) (Skin)  Resp 20  Ht 5' 10.5" (1.791 m)  Wt 259 lb (117.5 kg)  SpO2 95% Comment: RA  BMI 36.64 kg/m  General: Large frame, well-appearing, looks younger than his age.  HEENT: Unremarkable, NCAT, PERLA, EOMI, multiple missing and broken teeth in maxilla  Neck: no JVD, no bruits, no adenopathy  Chest: clear to auscultation, symmetrical breath sounds, no wheezes, no rhonchi  CV: RRR, grade lll/VI crescendo/decrescendo murmur heard best at RSB, no diastolic murmur  Abdomen: soft, non-tender  Extremities: warm, well-perfused, pulses palpable at ankles, no LE edema  Rectal/GU Deferred  Neuro: Grossly non-focal and symmetrical throughout  Skin: Clean and dry, no rashes, no breakdown    Diagnostic Tests:   Patient Name: JUEL BELLEROSE Date of Exam: 07/15/2019  Medical Rec #: [id number] Height: 70.5 in  Accession #: [id number] Weight: 267.0 lb  Date of Birth: 12/01/1940 BSA: 2.373 m  Patient Age: 75 years BP: 118/70 mmHg  Patient Gender: M HR: 54 bpm.  Exam Location: [address]   Procedure: 2D Echo, 3D Echo, Cardiac Doppler and Color Doppler   MODIFIED REPORT:  This report was modified by Cherlynn Kaiser MD on 07/15/2019 due to  report  revision.  Indications: I35.0 Aortic Stenosis   History: Patient has prior history of Echocardiogram examinations,  most  recent 05/30/2018. Aortic Valve Disease and Mitral Valve  Disease, Arrythmias:RBBB and Atrial Fibrillation;  Risk  Factors:Family History of Coronary Artery Disease,  Hypertension  and Diabetes. History of Lymphoma, Severe Aortic Stenosis  (prior Mean 40mmHG).   Sonographer: Deliah Boston RDCS  Referring Phys: [place]  Diagnosing Phys: Cherlynn Kaiser MD    Sonographer Comments: Technically difficult study due to poor echo  windows.  IMPRESSIONS    1. Left ventricular ejection fraction, by estimation, is 55%. The left  ventricle has normal function. Left ventricular endocardial border not  optimally defined to evaluate regional wall motion. Left ventricular  diastolic parameters are indeterminate.  Elevated left ventricular end-diastolic pressure. There is septal shudder  with mild respiratory related septal shift. Study was not protocoled to  assess for constrictive physiology.  2. Right ventricular systolic function is normal. The right ventricular  size is normal. There is severely elevated pulmonary artery systolic  pressure. The estimated right ventricular systolic pressure is 74.2 mmHg.  3. Left atrial size was severely dilated.  4. Right atrial size was severely dilated.  5. Calcification of the aortomitral continuity with severe aortic valve  stenosis and moderate mitral valve stesnosis. Findings suggestive of  radiation heart disease.  6. The mitral valve is abnormal. Mild to moderate mitral valve  regurgitation. Moderate mitral stenosis. The mean mitral valve gradient is  6.0 mmHg with average heart rate of 74 bpm.  7. The aortic valve is abnormal. Aortic valve regurgitation is not  visualized. Severe aortic valve stenosis. Aortic valve mean gradient  measures 44.0 mmHg.  8. Aortic dilatation noted. There is mild dilatation of the ascending  aorta measuring 41 mm.  9. The inferior vena cava is dilated in size with <50% respiratory  variability, suggesting right atrial pressure of 15 mmHg.   Comparison(s): A prior study was performed on 05/30/2018.  Comparison made  by report only. No significant change in biventricular function, aortic  stenosis or mitral valve stenosis.   FINDINGS  Left Ventricle: Left ventricular ejection fraction, by estimation, is  55%. The left ventricle has normal function. Left ventricular endocardial  border not optimally defined to evaluate regional wall motion. The left  ventricular internal cavity size was  normal in size. There is no left ventricular hypertrophy. Septal shudder  with mild respiratory related septal shift. Study was not protocoled to  assess for constrictive physiology. Left ventricular diastolic parameters  are indeterminate. Elevated left  ventricular end-diastolic pressure.   Right Ventricle: The right ventricular size is normal. No increase in  right ventricular wall thickness. Right ventricular systolic function is  normal. There is severely elevated pulmonary artery systolic pressure. The  tricuspid regurgitant velocity is  3.41 m/s, and with an assumed right atrial pressure of 15 mmHg, the  estimated right ventricular systolic pressure is 59.5 mmHg.   Left Atrium: Left atrial size was severely dilated.   Right Atrium: Right atrial size was severely dilated.   Pericardium: There is no evidence of pericardial effusion.   Mitral Valve: The mitral valve is abnormal. There is moderate  calcification of the mitral valve leaflet(s). Moderately decreased  mobility of the mitral valve leaflets. Severe mitral annular  calcification. Mild to moderate mitral valve regurgitation.  Moderate mitral valve stenosis. MV peak gradient, 22.7 mmHg. The mean  mitral valve gradient is 6.0 mmHg with average heart rate of 74 bpm.  Tricuspid Valve: The tricuspid valve is normal in structure. Tricuspid  valve regurgitation is mild . No evidence of tricuspid stenosis.   Aortic Valve: The aortic valve is abnormal. Aortic valve regurgitation is  not visualized. Severe aortic stenosis is present.  There is severe  calcifcation of the aortic valve. Aortic valve mean gradient measures 44.0  mmHg. Aortic valve peak gradient  measures 74.6 mmHg. Aortic valve area, by VTI measures 0.69 cm.   Pulmonic Valve: The pulmonic valve was normal in structure. Pulmonic valve  regurgitation is mild. No evidence of pulmonic stenosis.   Aorta: Aortic dilatation noted. There is mild dilatation of the ascending  aorta measuring 41 mm.   Venous: The inferior vena cava is dilated in size with less than 50%  respiratory variability, suggesting right atrial pressure of 15 mmHg.   IAS/Shunts: No atrial level shunt detected by color flow Doppler.    LEFT VENTRICLE  PLAX 2D  LVIDd: 4.91 cm Diastology  LVIDs: 2.59 cm LV e' lateral: 6.85 cm/s  LV PW: 1.11 cm LV E/e' lateral: 31.0  LV IVS: 0.90 cm LV e' medial: 7.07 cm/s  LVOT diam: 2.30 cm LV E/e' medial: 30.1  LV SV: 76  LV SV Index: 32  LVOT Area: 4.15 cm    LEFT ATRIUM Index RIGHT ATRIUM Index  LA diam: 5.80 cm 2.44 cm/m RA Area: 35.40 cm  LA Vol (A2C): 217.0 ml 91.46 ml/m RA Volume: 133.00 ml 56.06 ml/m  LA Vol (A4C): 159.0 ml 67.02 ml/m  LA Biplane Vol: 186.0 ml 78.40 ml/m  AORTIC VALVE  AV Area (Vmax): 0.68 cm  AV Area (Vmean): 0.64 cm  AV Area (VTI): 0.69 cm  AV Vmax: 432.00 cm/s  AV Vmean: 303.000 cm/s  AV VTI: 1.100 m  AV Peak Grad: 74.6 mmHg  AV Mean Grad: 44.0 mmHg  LVOT Vmax: 70.20 cm/s  LVOT Vmean: 46.800 cm/s  LVOT VTI: 0.182 m  LVOT/AV VTI ratio: 0.17   AORTA  Ao Root diam: 3.80 cm  Ao Asc diam: 4.10 cm   MITRAL VALVE TRICUSPID VALVE  MV Area (PHT): 2.42 cm TR Peak grad: 46.5 mmHg  MV Peak grad: 22.7 mmHg TR Vmax: 341.00 cm/s  MV Mean grad: 6.0 mmHg  MV Vmax: 2.38 m/s SHUNTS  MV Vmean: 102.0 cm/s Systemic VTI: 0.18 m  MV PHT: 144.35 msec Systemic Diam: 2.30 cm  MV Decel Time: 319 msec  MR Peak grad: 176.4 mmHg  MR Mean grad: 113.0 mmHg  MR Vmax: 664.00 cm/s  MR Vmean: 502.0 cm/s   MV E velocity:  212.50 cm/s    Cherlynn Kaiser MD  Electronically signed by Cherlynn Kaiser MD  Signature Date/Time: 07/15/2019/3:54:59 PM     Panel Physicians Referring Physician Case Authorizing Physician  Jettie Booze, MD (Primary)       Procedures  RIGHT/LEFT HEART CATH AND CORONARY ANGIOGRAPHY     Conclusion  The left ventricular systolic function is normal.  LV end diastolic pressure is normal.  The left ventricular ejection fraction is 55-65% by visual estimate.  There is severe aortic valve stenosis. Mean gradient 46 mm Hg.  There is mild mitral valve stenosis. Mitral valve area 2.1 cm2. Mean gradient 11 mm Hg.  Hemodynamic findings consistent with mild pulmonary hypertension.  No significant CAD. No significant CAD. Plan for TAVR w/u.      Recommendations  Discharge Date In the absence of any other complications or medical issues, we expect the patient to be ready for discharge from  a cath perspective on 08/12/2019.        Indications  Severe aortic stenosis [I35.0 (ICD-10-CM)]     Procedural Details  Technical Details The risks, benefits, and details of the procedure were explained to the patient. The patient verbalized understanding and wanted to proceed. Informed written consent was obtained.  PROCEDURE TECHNIQUE: After Xylocaine anesthesia, a 7 French sheath was placed in the right common femoral vein. A 7 [place] balloontipped Swan-Ganz catheter was advanced to the pulmonary artery under fluoroscopic guidance, with ultrasound guidance. Hemodynamic pressures were obtained. Oxygen saturations were obtained. After Xylocaine anesthesia, a 40F sheath was placed in the right femoral artery with a single anterior needle wall stick. Left coronary angiography was done using a Judkins L4 guide catheter. Right coronary angiography was done using a Judkins R4 guide catheter. Left heart cath was done using a JR4 catheter with straight wire.     Contrast: 65 cc  Estimated blood  loss <50 mL.   During this procedure medications were administered to achieve and maintain moderate conscious sedation while the patient's heart rate, blood pressure, and oxygen saturation were continuously monitored and I was present face-to-face 100% of this time.     Medications  (Filter: Administrations occurring from 08/12/19 1005 to 08/12/19 1125)  Continuous medications are totaled by the amount administered until 08/12/19 1125.  Heparin (Porcine) in NaCl 1000-0.9 UT/500ML-% SOLN (mL)  Total volume: 1,000 mL  Date/Time   Rate/Dose/Volume Action  08/12/19 1016  500 mL Given  1016  500 mL Given  midazolam (VERSED) injection (mg)  Total dose: 2 mg  Date/Time   Rate/Dose/Volume Action  08/12/19 1025  2 mg Given  fentaNYL (SUBLIMAZE) injection (mcg)  Total dose: 25 mcg  Date/Time   Rate/Dose/Volume Action  08/12/19 1026  25 mcg Given  lidocaine (PF) (XYLOCAINE) 1 % injection (mL)  Total volume: 25 mL  Date/Time   Rate/Dose/Volume Action  08/12/19 1030  25 mL Given  heparin sodium (porcine) injection (Units)  Total dose: 0 Units  Date/Time   Rate/Dose/Volume Action  08/12/19 1057  3,000 Units Canceled Entry  iohexol (OMNIPAQUE) 350 MG/ML injection (mL)  Total volume: 65 mL  Date/Time   Rate/Dose/Volume Action  08/12/19 1123  65 mL Given     Sedation Time  Sedation Time Physician-1: 59 minutes 38 seconds        Contrast  Medication Name Total Dose  iohexol (OMNIPAQUE) 350 MG/ML injection 65 mL     Radiation/Fluoro  Fluoro time: 12 (min)  DAP: 67591 (mGycm2)  Cumulative Air Kerma: 638 (mGy)     Complications  Complications documented before study signed (08/12/2019 46:65 AM)  No complications were associated with this study.  Documented by Jettie Booze, MD - 08/12/2019 11:27 AM     Coronary Findings  Diagnostic  Dominance: Right  Left Anterior Descending  The vessel exhibits minimal luminal irregularities.   Right Coronary Artery  The vessel  exhibits minimal luminal irregularities.  Intervention  No interventions have been documented.          Right Heart  Right Heart Pressures Hemodynamic findings consistent with mild pulmonary hypertension. Ao sat 97%, PA sat 71%, mean PA 33 mm Hg; mean PCWP 19 Hg; CO 6.5 L/min; CI 2.8 ([place])              Wall Motion         Resting               All segments of  the heart are normal.                      Left Heart  Left Ventricle The left ventricular size is normal. The left ventricular systolic function is normal. LV end diastolic pressure is normal. The left ventricular ejection fraction is 55-65% by visual estimate. No regional wall motion abnormalities.   Mitral Valve There is mild mitral valve stenosis.   Aortic Valve There is severe aortic valve stenosis.     Coronary Diagrams  Diagnostic  Dominance: Right  &&&&&  Intervention       Implants  No implant documentation for this case.      Syngo Images Link to Procedure Log  Show images for CARDIAC CATHETERIZATION Procedure Log     Images on Long Term Storage   Show images for [place]            Hemo Data    Most Recent Value  Fick Cardiac Output 6.57 L/min  Fick Cardiac Output Index 2.81 (L/min)/BSA  Thermal Cardiac Output 2.55 L/min  Thermal Cardiac Output Index 1.09 (L/min)/BSA  Aortic Mean Gradient 45.68 mmHg  Aortic Peak Gradient 65 mmHg  Aortic Valve Area 1.05  Aortic Value Area Index 0.45 cm2/BSA  Mitral Mean Gradient 11.44 mmHg  Mitral Peak Gradient 9 mmHg  Mitral Valve Area Index 0.67 cm2/BSA  RA A Wave -99 mmHg  RA V Wave 9 mmHg  RA Mean 8 mmHg  RV Systolic Pressure 66 mmHg  RV Diastolic Pressure 0 mmHg  RV EDP 7 mmHg  PA Systolic Pressure 65 mmHg  PA Diastolic Pressure 16 mmHg  PA Mean 33 mmHg  PW A Wave -99 mmHg  PW V Wave 29 mmHg  PW Mean 20 mmHg  AO Systolic Pressure 810 mmHg  AO Diastolic Pressure 51 mmHg  AO Mean 75 mmHg  LV Systolic Pressure 175 mmHg  LV  Diastolic Pressure 0 mmHg  LV EDP 10 mmHg  AOp Systolic Pressure 102 mmHg  AOp Diastolic Pressure 53 mmHg  AOp Mean Pressure 76 mmHg  LVp Systolic Pressure 585 mmHg  LVp Diastolic Pressure 1 mmHg  LVp EDP Pressure 11 mmHg  QP/QS 1  TPVR Index 11.76 HRUI  TSVR Index 26.73 HRUI  PVR SVR Ratio 0.21  TPVR/TSVR Ratio 0.44     ADDENDUM REPORT: 08/23/2019 15:30  ADDENDUM:  36 tear old male with severe aortic stenosis being evaluated for a  TAVR procedure.  EXAM:  Cardiac TAVR CT  TECHNIQUE:  The patient was scanned on a [employer]. A 120 kV  retrospective scan was triggered in the descending thoracic aorta at  111 HU's. Gantry rotation speed was 250 msecs and collimation was .6  mm. No beta blockade or nitro were given. The 3D data set was  reconstructed in 5% intervals of the R-R cycle. Systolic and  diastolic phases were analyzed on a dedicated work station using  MPR, MIP and VRT modes. The patient received 80 cc of contrast.  FINDINGS:  Aortic Root:  Aortic valve: Trileaflet  Aortic valve calcium score: 6709  Aortic annulus:  Diameter: 48mm x 74mm  Perimeter: 47mm  Area: 550 mm^2  Calcifications: Moderate calcification adjacent to left coronary  cusp. Mild calcification adjacent to noncoronary cusp.  Coronary height: Min Left - 64mm, Max Left - 3mm; Min Right - 73mm  Sinotubular height: Left cusp -88mm ; Right cusp - 58mm; Noncoronary  cusp - 85mm  LVOT (as measured 3  mm below the annulus):  Diameter: 27mm x 85mm  Area: 523 mm^2  Calcifications: Moderate calcification beneath left coronary cusp  Aortic sinus width: Left cusp - 37mm; Right cusp - 74mm; Noncoronary  cusp - 58mm  Sinotubular junction width: 94mm x 41mm  Optimum Fluoroscopic Angle for Delivery: LAO 7 CRA 4  Cardiac:  Right atrium: Moderate dilatation  Right ventricle: Moderate dilatation  Pulmonary arteries: Dilated, main PA measures 4mm  Pulmonary veins: Normal configuration  Left  atrium: Severe dilatation  Left ventricle: Normal size  Pericardium: Normal thickness  Coronary arteries: Calcium score 2772 (90th percentile)  IMPRESSION:  1. Trileaflet aortic valve with severe calcifications (calcium score  6709)  2. Aortic annulus measures 78mm x 83mm with perimeter 93mm and area  550 mm^2. There is moderate annular calcification adjacent to left  coronary cusp, which extends into LVOT. Annular measurements  suitable for delivery of 38mm Edwards-Sapient 3 valve  2. Sufficient coronary to annulus distance, measuring 13mm to left  main and 9mm to RCA  3. Optimum Fluoroscopic Angle for Delivery: LAO 7 CRA 4  4. Coronary calcium score 2772 (90th percentile)  Electronically Signed  By: Oswaldo Milian MD  On: 08/23/2019 15:30   Addended by Donato Heinz, MD on 08/23/2019 3:35 PM  Study Result   EXAM:  OVER-READ INTERPRETATION CT CHEST  The following report is an over-read performed by radiologist Dr.  Salvatore Marvel of [facility], [place] on 08/21/2019. This over-read  does not include interpretation of cardiac or coronary anatomy or  pathology. The coronary CT interpretation by the cardiologist is  attached.  COMPARISON: 04/17/2003 chest CT.  FINDINGS:  Please see the separate concurrent chest CT angiogram report for  details.  IMPRESSION:  Please see the separate concurrent chest CT angiogram report for  details.  Electronically Signed:  By: Ilona Sorrel M.D.  On: 08/21/2019 11:33   CLINICAL DATA: Severe symptomatic aortic stenosis. Pre-TAVR  evaluation.  EXAM:  CT ANGIOGRAPHY CHEST, ABDOMEN AND PELVIS  TECHNIQUE:  Multidetector CT imaging through the chest, abdomen and pelvis was  performed using the standard protocol during bolus administration of  intravenous contrast. Multiplanar reconstructed images and MIPs were  obtained and reviewed to evaluate the vascular anatomy.  CONTRAST: 168mL OMNIPAQUE IOHEXOL 350 MG/ML SOLN  COMPARISON:  04/17/2003 chest CT. 08/20/2018 CT abdomen/pelvis.  FINDINGS:  CTA CHEST FINDINGS  Cardiovascular: Mild cardiomegaly. Diffuse thickening and coarse  calcification of the aortic valve. No significant pericardial  effusion/thickening. Three-vessel coronary atherosclerosis.  Atherosclerotic nonaneurysmal thoracic aorta. Top-normal caliber  main pulmonary artery (3.4 cm diameter). No central pulmonary  emboli.  Mediastinum/Nodes: No discrete thyroid nodules. Unremarkable  esophagus. No pathologically enlarged axillary, mediastinal or hilar  lymph nodes.  Lungs/Pleura: No pneumothorax. No pleural effusion. No acute  consolidative airspace disease, lung masses or significant pulmonary  nodules.  Musculoskeletal: No aggressive appearing focal osseous lesions.  Marked thoracic spondylosis.  CTA ABDOMEN AND PELVIS FINDINGS  Hepatobiliary: Normal liver with no liver mass. Normal gallbladder  with no radiopaque cholelithiasis. No biliary ductal dilatation.  Pancreas: Low-attenuation 1.3 cm focus in the pancreatic body  (series 15/image 116). No additional potential pancreatic lesions.  No pancreatic duct dilation.  Spleen: Normal size. No mass.  Adrenals/Urinary Tract: Right adrenal 1.1 cm nodule with density 49  HU, stable since 04/17/2003 CT, compatible with a benign adenoma. No  left adrenal nodules. No hydronephrosis. No contour deforming renal  masses. Normal bladder.  Stomach/Bowel: Normal non-distended stomach. Normal caliber small  bowel with no small bowel wall thickening. Appendectomy. Mild  sigmoid diverticulosis, with no large bowel wall thickening or  significant pericolonic fat stranding.  Vascular/Lymphatic: Atherosclerotic nonaneurysmal abdominal aorta.  No pathologically enlarged lymph nodes in the abdomen or pelvis.  Reproductive: Mild prostatomegaly.  Other: No pneumoperitoneum, ascites or focal fluid collection.  Musculoskeletal: No aggressive appearing focal osseous  lesions.  Marked lumbar spondylosis.  VASCULAR MEASUREMENTS PERTINENT TO TAVR:  AORTA:  Minimal Aortic Diameter-14.1 x 13.2 mm  Severity of Aortic Calcification-moderate  RIGHT PELVIS:  Right Common Iliac Artery -  Minimal Diameter-10.9 x 10.7 mm  Tortuosity-mild  Calcification-mild  Right External Iliac Artery -  Minimal Diameter-8.4 x 8.3 mm  Tortuosity-moderate  Calcification-mild  Right Common Femoral Artery -  Minimal Diameter-8.4 x 7.8 mm  Tortuosity-mild  Calcification-mild  LEFT PELVIS:  Left Common Iliac Artery -  Minimal Diameter-11.4 x 9.6 mm  Tortuosity-mild to moderate  Calcification-mild  Left External Iliac Artery -  Minimal Diameter-9.7 x 8.4 mm  Tortuosity-mild  Calcification-mild  Left Common Femoral Artery -  Minimal Diameter-9.8 x 9.5 mm  Tortuosity-mild  Calcification-mild  Review of the MIP images confirms the above findings.  IMPRESSION:  1. Vascular findings and measurements pertinent to potential TAVR  procedure, as detailed.  2. Diffuse thickening and coarse calcification of the aortic valve,  compatible with the reported history of severe symptomatic aortic  stenosis.  3. Low-attenuation 1.3 cm focus in the pancreatic body. No biliary  or pancreatic duct dilation. MRI abdomen without and with IV  contrast recommended for further characterization.  4. Mild cardiomegaly.  5. Stable right adrenal adenoma.  6. Mild sigmoid diverticulosis.  7. Mild prostatomegaly.  8. Aortic Atherosclerosis (ICD10-I70.0).  Electronically Signed  By: Ilona Sorrel M.D.  On: 08/21/2019 12:33    STS Risk Score:  Risk of Mortality:  1.282%  Renal Failure:  1.925%  Permanent Stroke:  1.012%  Prolonged Ventilation:  6.057%  DSW Infection:  0.131%  Reoperation:  3.059%  Morbidity or Mortality:  10.783%  Short Length of Stay:  38.595%  Long Length of Stay:  4.535%    Impression:  This 79 year old gentleman has stage D, severe, symptomatic aortic  stenosis with [name] class II symptoms of exertional fatigue and shortness of breath consistent with chronic diastolic congestive heart failure. I have personally reviewed his 2D echocardiogram, cardiac catheterization, and CTA studies. Echocardiogram shows a severely calcified aortic valve with thickened leaflets and restricted mobility. Aortic valve mean gradient is 44 mmHg consistent with severe aortic stenosis. There is mild dilatation of the ascending aorta at 41 mm. There is moderate calcification of the mitral valve leaflets with severe mitral annular calcification. There is mild to moderate mitral regurgitation and moderate mitral valve stenosis with a mitral valve peak gradient of 22.7 mmHg and a mean gradient of 6 mmHg. Cardiac catheterization shows no significant coronary disease. The mean gradient across aortic valve was measured at 46 mmHg. I agree that aortic valve replacement is indicated in this patient for relief of his symptoms and to prevent progressive left ventricular deterioration. Given his age and comorbid risk factors I think transcatheter aortic valve replacement would be the best option for him. His gated cardiac CTA shows anatomy suitable for transcatheter aortic valve replacement using a SAPIEN 3 valve. His abdominal and pelvic CTA shows adequate pelvic vascular anatomy to allow transfemoral insertion.  The patient was counseled at length regarding treatment alternatives for management of severe symptomatic aortic stenosis. The  risks and benefits of surgical intervention has been discussed in detail. Long-term prognosis with medical therapy was discussed. Alternative approaches such as conventional surgical aortic valve replacement, transcatheter aortic valve replacement, and palliative medical therapy were compared and contrasted at length. This discussion was placed in the context of the patient's own specific clinical presentation and past medical history. All of  his questions have been addressed.  Following the decision to proceed with transcatheter aortic valve replacement, a discussion was held regarding what types of management strategies would be attempted intraoperatively in the event of life-threatening complications, including whether or not the patient would be considered a candidate for the use of cardiopulmonary bypass and/or conversion to open sternotomy for attempted surgical intervention. The patient is aware of the fact that transient use of cardiopulmonary bypass may be necessary. Given his low surgical risk I think he would be a candidate for emergent sternotomy to manage any intraoperative complications. The patient has been advised of a variety of complications that might develop including but not limited to risks of death, stroke, paravalvular leak, aortic dissection or other major vascular complications, aortic annulus rupture, device embolization, cardiac rupture or perforation, mitral regurgitation, acute myocardial infarction, arrhythmia, heart block or bradycardia requiring permanent pacemaker placement, congestive heart failure, respiratory failure, renal failure, pneumonia, infection, other late complications related to structural valve deterioration or migration, or other complications that might ultimately cause a temporary or permanent loss of functional independence or other long term morbidity. The patient provides full informed consent for the procedure as described and all questions were answered.   Plan:   Transfemoral TAVR on 09/16/2019.   Gaye Pollack, MD

## 2019-09-15 NOTE — Patient Instructions (Signed)
PLAN: 1.  Continue salt water rinses as needed to aid healing. 2.  Continue to advance diet as tolerated.  Patient indicated that he tried Glucerna but did not like the taste.  Patient was given alternative sources of protein to include scrambled eggs, peanut butter, Greek yogurt, and canned chicken once he has healed more.  Patient expresses understanding. 3.  Patient is to return to clinic for evaluation of healing in 1 month.  Patient will then follow-up with a dentist of his choice for fabrication of upper complete denture and lower partial denture as indicated after adequate healing and once medically stable from anticipated heart valve surgery. 4.  Patient is currently cleared for TAVR procedure tomorrow.   Lenn Cal, DDS

## 2019-09-16 ENCOUNTER — Encounter (HOSPITAL_COMMUNITY)
Admission: RE | Disposition: A | Discharge status: Home / Self Care | Payer: Self-pay | Source: Home / Self Care | Attending: Surgery

## 2019-09-16 ENCOUNTER — Inpatient Hospital Stay (HOSPITAL_COMMUNITY): Payer: PPO | Admitting: Physician Assistant

## 2019-09-16 ENCOUNTER — Inpatient Hospital Stay (HOSPITAL_COMMUNITY)
Admission: RE | Admit: 2019-09-16 | Discharge: 2019-09-19 | DRG: 266 | Disposition: A | Discharge status: Home / Self Care | Payer: PPO | Attending: Surgery | Admitting: Surgery

## 2019-09-16 ENCOUNTER — Encounter (HOSPITAL_COMMUNITY): Payer: Self-pay | Admitting: Cardiovascular Disease

## 2019-09-16 ENCOUNTER — Inpatient Hospital Stay (HOSPITAL_COMMUNITY): Payer: PPO | Admitting: Vascular Surgery

## 2019-09-16 ENCOUNTER — Inpatient Hospital Stay (HOSPITAL_COMMUNITY): Payer: PPO

## 2019-09-16 ENCOUNTER — Other Ambulatory Visit: Payer: Self-pay | Admitting: Physician Assistant

## 2019-09-16 ENCOUNTER — Other Ambulatory Visit: Payer: Self-pay

## 2019-09-16 DIAGNOSIS — J90 Pleural effusion, not elsewhere classified: Secondary | ICD-10-CM | POA: Diagnosis not present

## 2019-09-16 DIAGNOSIS — Z7901 Long term (current) use of anticoagulants: Secondary | ICD-10-CM

## 2019-09-16 DIAGNOSIS — Z87442 Personal history of urinary calculi: Secondary | ICD-10-CM

## 2019-09-16 DIAGNOSIS — E782 Mixed hyperlipidemia: Secondary | ICD-10-CM | POA: Diagnosis present

## 2019-09-16 DIAGNOSIS — E669 Obesity, unspecified: Secondary | ICD-10-CM | POA: Diagnosis not present

## 2019-09-16 DIAGNOSIS — Z8711 Personal history of peptic ulcer disease: Secondary | ICD-10-CM

## 2019-09-16 DIAGNOSIS — I080 Rheumatic disorders of both mitral and aortic valves: Secondary | ICD-10-CM | POA: Diagnosis not present

## 2019-09-16 DIAGNOSIS — I350 Nonrheumatic aortic (valve) stenosis: Secondary | ICD-10-CM | POA: Diagnosis not present

## 2019-09-16 DIAGNOSIS — Z8249 Family history of ischemic heart disease and other diseases of the circulatory system: Secondary | ICD-10-CM | POA: Diagnosis not present

## 2019-09-16 DIAGNOSIS — Z881 Allergy status to other antibiotic agents status: Secondary | ICD-10-CM

## 2019-09-16 DIAGNOSIS — Z952 Presence of prosthetic heart valve: Secondary | ICD-10-CM

## 2019-09-16 DIAGNOSIS — C8590 Non-Hodgkin lymphoma, unspecified, unspecified site: Secondary | ICD-10-CM | POA: Diagnosis present

## 2019-09-16 DIAGNOSIS — I871 Compression of vein: Secondary | ICD-10-CM | POA: Diagnosis present

## 2019-09-16 DIAGNOSIS — E119 Type 2 diabetes mellitus without complications: Secondary | ICD-10-CM

## 2019-09-16 DIAGNOSIS — I77810 Thoracic aortic ectasia: Secondary | ICD-10-CM | POA: Diagnosis present

## 2019-09-16 DIAGNOSIS — Z801 Family history of malignant neoplasm of trachea, bronchus and lung: Secondary | ICD-10-CM

## 2019-09-16 DIAGNOSIS — Z6836 Body mass index (BMI) 36.0-36.9, adult: Secondary | ICD-10-CM

## 2019-09-16 DIAGNOSIS — Z888 Allergy status to other drugs, medicaments and biological substances status: Secondary | ICD-10-CM

## 2019-09-16 DIAGNOSIS — I5033 Acute on chronic diastolic (congestive) heart failure: Secondary | ICD-10-CM | POA: Diagnosis present

## 2019-09-16 DIAGNOSIS — I272 Pulmonary hypertension, unspecified: Secondary | ICD-10-CM | POA: Diagnosis present

## 2019-09-16 DIAGNOSIS — I110 Hypertensive heart disease with heart failure: Secondary | ICD-10-CM | POA: Diagnosis present

## 2019-09-16 DIAGNOSIS — Z89021 Acquired absence of right finger(s): Secondary | ICD-10-CM

## 2019-09-16 DIAGNOSIS — I482 Chronic atrial fibrillation, unspecified: Secondary | ICD-10-CM | POA: Diagnosis present

## 2019-09-16 DIAGNOSIS — R001 Bradycardia, unspecified: Secondary | ICD-10-CM | POA: Diagnosis present

## 2019-09-16 DIAGNOSIS — I442 Atrioventricular block, complete: Secondary | ICD-10-CM | POA: Diagnosis not present

## 2019-09-16 DIAGNOSIS — I10 Essential (primary) hypertension: Secondary | ICD-10-CM | POA: Diagnosis present

## 2019-09-16 DIAGNOSIS — T8203XA Leakage of heart valve prosthesis, initial encounter: Secondary | ICD-10-CM | POA: Diagnosis not present

## 2019-09-16 DIAGNOSIS — Z79899 Other long term (current) drug therapy: Secondary | ICD-10-CM | POA: Diagnosis not present

## 2019-09-16 DIAGNOSIS — I4821 Permanent atrial fibrillation: Secondary | ICD-10-CM | POA: Diagnosis not present

## 2019-09-16 DIAGNOSIS — J9811 Atelectasis: Secondary | ICD-10-CM | POA: Diagnosis not present

## 2019-09-16 DIAGNOSIS — Z7984 Long term (current) use of oral hypoglycemic drugs: Secondary | ICD-10-CM | POA: Diagnosis not present

## 2019-09-16 DIAGNOSIS — Z887 Allergy status to serum and vaccine status: Secondary | ICD-10-CM

## 2019-09-16 DIAGNOSIS — Z954 Presence of other heart-valve replacement: Secondary | ICD-10-CM | POA: Diagnosis not present

## 2019-09-16 DIAGNOSIS — I5031 Acute diastolic (congestive) heart failure: Secondary | ICD-10-CM | POA: Diagnosis present

## 2019-09-16 DIAGNOSIS — Z006 Encounter for examination for normal comparison and control in clinical research program: Secondary | ICD-10-CM | POA: Diagnosis not present

## 2019-09-16 DIAGNOSIS — Z8572 Personal history of non-Hodgkin lymphomas: Secondary | ICD-10-CM

## 2019-09-16 DIAGNOSIS — Z833 Family history of diabetes mellitus: Secondary | ICD-10-CM

## 2019-09-16 DIAGNOSIS — Z9221 Personal history of antineoplastic chemotherapy: Secondary | ICD-10-CM

## 2019-09-16 DIAGNOSIS — I059 Rheumatic mitral valve disease, unspecified: Secondary | ICD-10-CM | POA: Diagnosis present

## 2019-09-16 DIAGNOSIS — M1711 Unilateral primary osteoarthritis, right knee: Secondary | ICD-10-CM | POA: Diagnosis present

## 2019-09-16 DIAGNOSIS — I4891 Unspecified atrial fibrillation: Secondary | ICD-10-CM | POA: Diagnosis present

## 2019-09-16 DIAGNOSIS — Z950 Presence of cardiac pacemaker: Secondary | ICD-10-CM | POA: Diagnosis not present

## 2019-09-16 DIAGNOSIS — Z95818 Presence of other cardiac implants and grafts: Secondary | ICD-10-CM

## 2019-09-16 DIAGNOSIS — I517 Cardiomegaly: Secondary | ICD-10-CM | POA: Diagnosis not present

## 2019-09-16 HISTORY — PX: TEE WITHOUT CARDIOVERSION: SHX5443

## 2019-09-16 HISTORY — DX: Type 2 diabetes mellitus without complications: E11.9

## 2019-09-16 HISTORY — PX: TRANSCATHETER AORTIC VALVE REPLACEMENT, TRANSFEMORAL: SHX6400

## 2019-09-16 HISTORY — DX: Presence of prosthetic heart valve: Z95.2

## 2019-09-16 LAB — POCT I-STAT, CHEM 8
BUN: 15 mg/dL (ref 8–23)
BUN: 14 mg/dL (ref 8–23)
BUN: 14 mg/dL (ref 8–23)
Calcium, Ion: 1.18 mmol/L (ref 1.15–1.40)
Calcium, Ion: 1.24 mmol/L (ref 1.15–1.40)
Calcium, Ion: 1.22 mmol/L (ref 1.15–1.40)
Chloride: 100 mmol/L (ref 98–111)
Chloride: 99 mmol/L (ref 98–111)
Chloride: 100 mmol/L (ref 98–111)
Creatinine, Ser: 1.3 mg/dL — ABNORMAL HIGH (ref 0.61–1.24)
Creatinine, Ser: 1.2 mg/dL (ref 0.61–1.24)
Creatinine, Ser: 1.2 mg/dL (ref 0.61–1.24)
Glucose, Bld: 147 mg/dL — ABNORMAL HIGH (ref 70–99)
Glucose, Bld: 161 mg/dL — ABNORMAL HIGH (ref 70–99)
Glucose, Bld: 163 mg/dL — ABNORMAL HIGH (ref 70–99)
HCT: 36 % — ABNORMAL LOW (ref 39.0–52.0)
HCT: 34 % — ABNORMAL LOW (ref 39.0–52.0)
HCT: 33 % — ABNORMAL LOW (ref 39.0–52.0)
Hemoglobin: 12.2 g/dL — ABNORMAL LOW (ref 13.0–17.0)
Hemoglobin: 11.6 g/dL — ABNORMAL LOW (ref 13.0–17.0)
Hemoglobin: 11.2 g/dL — ABNORMAL LOW (ref 13.0–17.0)
Potassium: 4.4 mmol/L (ref 3.5–5.1)
Potassium: 4.5 mmol/L (ref 3.5–5.1)
Potassium: 4.6 mmol/L (ref 3.5–5.1)
Sodium: 137 mmol/L (ref 135–145)
Sodium: 137 mmol/L (ref 135–145)
Sodium: 137 mmol/L (ref 135–145)
TCO2: 24 mmol/L (ref 22–32)
TCO2: 23 mmol/L (ref 22–32)
TCO2: 25 mmol/L (ref 22–32)

## 2019-09-16 LAB — POCT ACTIVATED CLOTTING TIME
Activated Clotting Time: 142 seconds
Activated Clotting Time: 268 seconds
Activated Clotting Time: 125 seconds

## 2019-09-16 LAB — GLUCOSE, CAPILLARY
Glucose-Capillary: 129 mg/dL — ABNORMAL HIGH (ref 70–99)
Glucose-Capillary: 110 mg/dL — ABNORMAL HIGH (ref 70–99)
Glucose-Capillary: 123 mg/dL — ABNORMAL HIGH (ref 70–99)

## 2019-09-16 LAB — PROTIME-INR
INR: 1.3 — ABNORMAL HIGH (ref 0.8–1.2)
Prothrombin Time: 15.4 seconds — ABNORMAL HIGH (ref 11.4–15.2)

## 2019-09-16 LAB — APTT: aPTT: 29 seconds (ref 24–36)

## 2019-09-16 SURGERY — IMPLANTATION, AORTIC VALVE, TRANSCATHETER, FEMORAL APPROACH
Anesthesia: Monitor Anesthesia Care

## 2019-09-16 MED ORDER — IOHEXOL 350 MG/ML SOLN
INTRAVENOUS | Status: DC | PRN
  Administered 2019-09-16: 40 mL

## 2019-09-16 MED ORDER — INSULIN ASPART 100 UNIT/ML ~~LOC~~ SOLN
0.0000 [IU] | Freq: Three times a day (TID) | SUBCUTANEOUS | Status: DC
  Administered 2019-09-17: 4 [IU] via SUBCUTANEOUS
  Administered 2019-09-18: 2 [IU] via SUBCUTANEOUS
  Administered 2019-09-18: 12 [IU] via SUBCUTANEOUS
  Administered 2019-09-18 – 2019-09-19 (×2): 2 [IU] via SUBCUTANEOUS
  Administered 2019-09-19: 12 [IU] via SUBCUTANEOUS

## 2019-09-16 MED ORDER — HEPARIN (PORCINE) IN NACL 1000-0.9 UT/500ML-% IV SOLN
INTRAVENOUS | Status: AC
  Filled 2019-09-16: qty 500

## 2019-09-16 MED ORDER — CHLORHEXIDINE GLUCONATE 0.12 % MT SOLN
15.0000 mL | Freq: Once | OROMUCOSAL | Status: AC
  Administered 2019-09-16 – 2019-09-17 (×2): 15 mL via OROMUCOSAL
  Filled 2019-09-16 (×2): qty 15

## 2019-09-16 MED ORDER — SODIUM CHLORIDE 0.9 % IV SOLN: 250.0000 mL | INTRAVENOUS | Status: DC | PRN

## 2019-09-16 MED ORDER — SODIUM CHLORIDE 0.9% FLUSH: 3.0000 mL | INTRAVENOUS | Status: DC | PRN

## 2019-09-16 MED ORDER — SODIUM CHLORIDE 0.9 % IV SOLN: INTRAVENOUS | Status: DC

## 2019-09-16 MED ORDER — SODIUM CHLORIDE 0.9% FLUSH
3.0000 mL | Freq: Two times a day (BID) | INTRAVENOUS | Status: DC
  Administered 2019-09-16 – 2019-09-19 (×6): 3 mL via INTRAVENOUS

## 2019-09-16 MED ORDER — CHLORHEXIDINE GLUCONATE 4 % EX LIQD
60.0000 mL | Freq: Once | CUTANEOUS | Status: DC
  Filled 2019-09-16: qty 60

## 2019-09-16 MED ORDER — IOHEXOL 350 MG/ML SOLN
INTRAVENOUS | Status: AC
  Filled 2019-09-16: qty 1

## 2019-09-16 MED ORDER — SODIUM CHLORIDE 0.9 % IV SOLN
1.5000 g | Freq: Two times a day (BID) | INTRAVENOUS | Status: DC
  Administered 2019-09-16 – 2019-09-17 (×2): 1.5 g via INTRAVENOUS
  Filled 2019-09-16 (×4): qty 1.5

## 2019-09-16 MED ORDER — MORPHINE SULFATE (PF) 2 MG/ML IV SOLN: 1.0000 mg | INTRAVENOUS | Status: DC | PRN

## 2019-09-16 MED ORDER — NITROGLYCERIN IN D5W 200-5 MCG/ML-% IV SOLN: 0.0000 ug/min | INTRAVENOUS | Status: DC

## 2019-09-16 MED ORDER — HEPARIN (PORCINE) IN NACL 1000-0.9 UT/500ML-% IV SOLN
INTRAVENOUS | Status: DC | PRN
  Administered 2019-09-16 (×3): 500 mL

## 2019-09-16 MED ORDER — OXYCODONE HCL 5 MG PO TABS: 5.0000 mg | ORAL_TABLET | ORAL | Status: DC | PRN

## 2019-09-16 MED ORDER — TRAMADOL HCL 50 MG PO TABS: 50.0000 mg | ORAL_TABLET | ORAL | Status: DC | PRN

## 2019-09-16 MED ORDER — VANCOMYCIN HCL IN DEXTROSE 1-5 GM/200ML-% IV SOLN
1000.0000 mg | Freq: Once | INTRAVENOUS | Status: AC
  Administered 2019-09-16: 1000 mg via INTRAVENOUS
  Filled 2019-09-16: qty 200

## 2019-09-16 MED ORDER — NITROGLYCERIN IN D5W 200-5 MCG/ML-% IV SOLN: INTRAVENOUS | Status: DC | PRN

## 2019-09-16 MED ORDER — ACETAMINOPHEN 650 MG RE SUPP: 650.0000 mg | Freq: Four times a day (QID) | RECTAL | Status: DC | PRN

## 2019-09-16 MED ORDER — SODIUM CHLORIDE 0.9 % IV SOLN: INTRAVENOUS | Status: AC

## 2019-09-16 MED ORDER — PHENYLEPHRINE HCL-NACL 20-0.9 MG/250ML-% IV SOLN: 0.0000 ug/min | INTRAVENOUS | Status: DC

## 2019-09-16 MED ORDER — LIDOCAINE HCL (PF) 1 % IJ SOLN
INTRAMUSCULAR | Status: AC
  Filled 2019-09-16: qty 30

## 2019-09-16 MED ORDER — PROTAMINE SULFATE 10 MG/ML IV SOLN
INTRAVENOUS | Status: DC | PRN
Start: 2019-09-16 — End: 2019-09-16
  Administered 2019-09-16: 120 mg via INTRAVENOUS

## 2019-09-16 MED ORDER — ONDANSETRON HCL 4 MG/2ML IJ SOLN: 4.0000 mg | Freq: Four times a day (QID) | INTRAMUSCULAR | Status: DC | PRN

## 2019-09-16 MED ORDER — LIDOCAINE HCL (PF) 1 % IJ SOLN
INTRAMUSCULAR | Status: DC | PRN
  Administered 2019-09-16: 30 mL

## 2019-09-16 MED ORDER — ACETAMINOPHEN 325 MG PO TABS: 650.0000 mg | ORAL_TABLET | Freq: Four times a day (QID) | ORAL | Status: DC | PRN

## 2019-09-16 MED ORDER — ATORVASTATIN CALCIUM 10 MG PO TABS
20.0000 mg | ORAL_TABLET | Freq: Every day | ORAL | Status: DC
  Administered 2019-09-16 – 2019-09-18 (×2): 20 mg via ORAL
  Filled 2019-09-16 (×2): qty 2

## 2019-09-16 MED ORDER — PHENYLEPHRINE HCL-NACL 10-0.9 MG/250ML-% IV SOLN
0.0000 ug/min | INTRAVENOUS | Status: DC
  Administered 2019-09-16: 2 ug/min via INTRAVENOUS
  Administered 2019-09-17: 12 ug/min via INTRAVENOUS
  Filled 2019-09-16: qty 250

## 2019-09-16 MED ORDER — ASPIRIN 81 MG PO CHEW
81.0000 mg | CHEWABLE_TABLET | Freq: Every day | ORAL | Status: DC
  Administered 2019-09-17 – 2019-09-19 (×3): 81 mg via ORAL
  Filled 2019-09-16 (×3): qty 1

## 2019-09-16 MED ORDER — HEPARIN SODIUM (PORCINE) 1000 UNIT/ML IJ SOLN
INTRAMUSCULAR | Status: DC | PRN
Start: 2019-09-16 — End: 2019-09-16
  Administered 2019-09-16: 12000 [IU] via INTRAVENOUS

## 2019-09-16 MED ORDER — LACTATED RINGERS IV SOLN: INTRAVENOUS | Status: DC

## 2019-09-16 MED ORDER — CHLORHEXIDINE GLUCONATE 4 % EX LIQD
30.0000 mL | CUTANEOUS | Status: DC
  Filled 2019-09-16: qty 30

## 2019-09-16 MED ORDER — CHLORHEXIDINE GLUCONATE CLOTH 2 % EX PADS
6.0000 | MEDICATED_PAD | Freq: Every day | CUTANEOUS | Status: DC
  Administered 2019-09-17: 6 via TOPICAL

## 2019-09-16 SURGICAL SUPPLY — 41 items
ADH SKN CLS APL DERMABOND .7 (GAUZE/BANDAGES/DRESSINGS) ×1
BAG SNAP BAND KOVER 36X36 (MISCELLANEOUS) ×6 IMPLANT
BLANKET WARM UNDERBOD FULL ACC (MISCELLANEOUS) ×2 IMPLANT
CABLE ADAPT PACING TEMP 12FT (ADAPTER) ×1 IMPLANT
CATH 29 EDWARDS DELIVERY SYS (CATHETERS) ×2 IMPLANT
CATH DIAG 6FR PIGTAIL ANGLED (CATHETERS) ×2 IMPLANT
CATH INFINITI 6F AL2 (CATHETERS) ×1 IMPLANT
CATH S G BIP PACING (CATHETERS) IMPLANT
CATH TEMPO TEMP PACE LEAD (CATHETERS) IMPLANT
CATHETER TEMPO TEMP PACE LEAD (CATHETERS) ×2
CLOSURE MYNX CONTROL 6F/7F (Vascular Products) ×2 IMPLANT
CRIMPER (MISCELLANEOUS) ×1 IMPLANT
DERMABOND ADVANCED (GAUZE/BANDAGES/DRESSINGS) ×1
DERMABOND ADVANCED .7 DNX12 (GAUZE/BANDAGES/DRESSINGS) IMPLANT
DEVICE CLOSURE PERCLS PRGLD 6F (VASCULAR PRODUCTS) IMPLANT
DEVICE INFLATION ATRION QL38 (MISCELLANEOUS) ×2 IMPLANT
ELECT DEFIB PAD ADLT CADENCE (PAD) ×1 IMPLANT
GUIDEWIRE SAFE TJ AMPLATZ EXST (WIRE) ×1 IMPLANT
KIT HEART LEFT (KITS) ×2 IMPLANT
KIT MICROPUNCTURE NIT STIFF (SHEATH) ×2 IMPLANT
PACK CARDIAC CATHETERIZATION (CUSTOM PROCEDURE TRAY) ×2 IMPLANT
PERCLOSE PROGLIDE 6F (VASCULAR PRODUCTS) ×4
SHEATH 16X36 EDWARDS (SHEATH) ×2 IMPLANT
SHEATH BRITE TIP 7FR 35CM (SHEATH) ×2 IMPLANT
SHEATH PINNACLE 6F 10CM (SHEATH) ×2 IMPLANT
SHEATH PINNACLE 8F 10CM (SHEATH) ×1 IMPLANT
SHEATH PROBE COVER 6X72 (BAG) ×1 IMPLANT
SHIELD RADPAD SCOOP 12X17 (MISCELLANEOUS) ×2 IMPLANT
SLEEVE REPOSITIONING LENGTH 30 (MISCELLANEOUS) ×1 IMPLANT
STOPCOCK MORSE 400PSI 3WAY (MISCELLANEOUS) ×4 IMPLANT
SYR MEDRAD MARK V 150ML (SYRINGE) ×2 IMPLANT
TRANSDUCER W/STOPCOCK (MISCELLANEOUS) ×4 IMPLANT
TUBE CONN 8.8X1320 FR HP M-F (CONNECTOR) ×2 IMPLANT
TUBING ART PRESS 72  MALE/FEM (TUBING) ×2
TUBING ART PRESS 72 MALE/FEM (TUBING) IMPLANT
TUBING CIL FLEX 10 FLL-RA (TUBING) ×1 IMPLANT
VALVE HEART TRANSCATH SZ3 29MM (Valve) ×1 IMPLANT
WIRE AMPLATZ SS-J .035X180CM (WIRE) ×2 IMPLANT
WIRE EMERALD 3MM-J .035X150CM (WIRE) ×1 IMPLANT
WIRE EMERALD 3MM-J .035X260CM (WIRE) ×1 IMPLANT
WIRE EMERALD ST .035X260CM (WIRE) ×2 IMPLANT

## 2019-09-16 NOTE — Anesthesia Procedure Notes (Signed)
Arterial Line Insertion Start/End6/15/2021 8:20 AM, 09/16/2019 8:30 AM Performed by: Amadeo Garnet, CRNA, CRNA  Patient location: Pre-op. Preanesthetic checklist: patient identified, IV checked, site marked, risks and benefits discussed, surgical consent, monitors and equipment checked, pre-op evaluation, timeout performed and anesthesia consent Lidocaine 1% used for infiltration Left, radial was placed Catheter size: 20 G Hand hygiene performed  and maximum sterile barriers used   Attempts: 1 Procedure performed without using ultrasound guided technique. Following insertion, Biopatch and dressing applied. Post procedure assessment: normal  Patient tolerated the procedure well with no immediate complications.

## 2019-09-16 NOTE — Progress Notes (Signed)
*  PRELIMINARY RESULTS* Echocardiogram 2D Echocardiogram has been performed.  Gregory [name] 09/16/2019, 11:29 AM

## 2019-09-16 NOTE — Op Note (Addendum)
[facility]   MULTIDISCIPLINARY HEART VALVE TEAM   TAVR OPERATIVE NOTE   Date of Procedure:  09/16/2019  Preoperative Diagnosis: Severe Aortic Stenosis   Postoperative Diagnosis: Same   Procedure:    Transcatheter Aortic Valve Replacement - Percutaneous  Transfemoral Approach  Edwards Sapien 3 THV (size 29 mm, model # 9600TFX, serial # [id number])   Co-Surgeons:  Gaye Pollack, MD and Sherren Mocha, MD  Anesthesiologist:  Midge Minium, MD  Echocardiographer:  Sanda Klein, MD  Pre-operative Echo Findings:  Severe aortic stenosis  Normal left ventricular systolic function  Post-operative Echo Findings:  Mild paravalvular leak  Normal left ventricular systolic function  BRIEF CLINICAL NOTE AND INDICATIONS FOR SURGERY 79 year old gentleman who is demonstrated to suffer from severe, symptomatic stage D1 aortic stenosis.  The patient has permanent atrial fibrillation on warfarin, chronic diastolic heart failure, moderate mitral regurgitation, diabetes, and non-Hodgkin's lymphoma.  He has been shown to have progressive and now severe aortic stenosis with associated symptoms.   During the course of the patient's preoperative work up they have been evaluated comprehensively by a multidisciplinary team of specialists coordinated through the [facility] in the [facility].  They have been demonstrated to suffer from symptomatic severe aortic stenosis as noted above. The patient has been counseled extensively as to the relative risks and benefits of all options for the treatment of severe aortic stenosis including long term medical therapy, conventional surgery for aortic valve replacement, and transcatheter aortic valve replacement.  The patient has been independently evaluated in formal cardiac surgical consultation by Dr Cyndia Bent, who deemed the patient appropriate for TAVR. Based upon review of all of the  patient's preoperative diagnostic tests they are felt to be candidate for transcatheter aortic valve replacement using the transfemoral approach as an alternative to conventional surgery.    Following the decision to proceed with transcatheter aortic valve replacement, a discussion has been held regarding what types of management strategies would be attempted intraoperatively in the event of life-threatening complications, including whether or not the patient would be considered a candidate for the use of cardiopulmonary bypass and/or conversion to open sternotomy for attempted surgical intervention.  The patient has been advised of a variety of complications that might develop peculiar to this approach including but not limited to risks of death, stroke, paravalvular leak, aortic dissection or other major vascular complications, aortic annulus rupture, device embolization, cardiac rupture or perforation, acute myocardial infarction, arrhythmia, heart block or bradycardia requiring permanent pacemaker placement, congestive heart failure, respiratory failure, renal failure, pneumonia, infection, other late complications related to structural valve deterioration or migration, or other complications that might ultimately cause a temporary or permanent loss of functional independence or other long term morbidity.  The patient provides full informed consent for the procedure as described and all questions were answered preoperatively.  DETAILS OF THE OPERATIVE PROCEDURE  PREPARATION:   The patient is brought to the operating room on the above mentioned date and central monitoring was established by the anesthesia team including placement of a central venous catheter and radial arterial line. The patient is placed in the supine position on the operating table.  Intravenous antibiotics are administered. The patient is monitored closely throughout the procedure under conscious sedation.  Baseline transthoracic  echocardiogram is performed. The patient's chest, abdomen, both groins, and both lower extremities are prepared and draped in a sterile manner. A time out procedure is performed.   PERIPHERAL ACCESS:  Using ultrasound guidance, a femoral arterial access is obtained with placement of 6 Fr sheaths on the  side.  A pigtail diagnostic catheter was passed through the femoral arterial sheath under fluoroscopic guidance into the aortic root. Aortic root angiography was performed in order to determine the optimal angiographic angle for valve deployment.  A right internal jugular sheath was placed under ultrasound guidance using micropuncture technique.  A Tempo pacing wire is inserted into the RV apex using normal technique and the nitinol fixation wire is deployed.  Capture is less than 0.8 mA.  TRANSFEMORAL ACCESS:  A micropuncture technique is used to access the right femoral artery under fluoroscopic and ultrasound guidance.  2 Perclose devices are deployed at 10' and 2' positions to 'PreClose' the femoral artery. An 8 French sheath is placed and then an Amplatz Superstiff wire is advanced through the sheath. This is changed out for a 16 French transfemoral E-Sheath after progressively dilating over the Superstiff wire.  An AL-2 catheter was used to direct a straight-tip exchange length wire across the native aortic valve into the left ventricle. This was exchanged out for a pigtail catheter and position was confirmed in the LV apex. Simultaneous LV and Ao pressures were recorded.  The pigtail catheter was exchanged for an Amplatz Extra-stiff wire in the LV apex.    BALLOON AORTIC VALVULOPLASTY:  Not performed  TRANSCATHETER HEART VALVE DEPLOYMENT:  An Edwards Sapien 3 transcatheter heart valve (size 29 mm) was prepared and crimped per manufacturer's guidelines, and the proper orientation of the valve is confirmed on the [employer] delivery system. The valve was advanced through the introducer  sheath using normal technique until in an appropriate position in the abdominal aorta beyond the sheath tip. The balloon was then retracted and using the fine-tuning wheel was centered on the valve. The valve was then advanced across the aortic arch using appropriate flexion of the catheter. The valve was carefully positioned across the aortic valve annulus. The Commander catheter was retracted using normal technique. Once final position of the valve has been confirmed by angiographic assessment, the valve is deployed while temporarily holding ventilation and during rapid ventricular pacing to maintain systolic blood pressure < 50 mmHg and pulse pressure < 10 mmHg. The balloon inflation is held for >3 seconds after reaching full deployment volume. Once the balloon has fully deflated the balloon is retracted into the ascending aorta and valve function is assessed using echocardiography. The patient's hemodynamic recovery following valve deployment is good. He required pacing with no underlying ventricular rhythm post-valve deployment.   The deployment balloon and guidewire are both removed. Echo demostrated acceptable post-procedural gradients, stable mitral valve function, and mild aortic insufficiency.    PROCEDURE COMPLETION:  The sheath was removed and femoral artery closure is performed using the 2 previously deployed Perclose devices.  Protamine is administered once femoral arterial repair was complete. The site is clear with no evidence of bleeding or hematoma after the sutures are tightened. The pigtail catheters are removed. Mynx closure is used for contralateral femoral arterial hemostasis for the 6 Fr sheath.  The temporary pacing wire was left in place at the completion of the procedure and is used for pacing the patient who has no underlying rhythm.  The patient tolerated the procedure well and is transported to the surgical intensive care in stable condition. There were no immediate intraoperative  complications. All sponge instrument and needle counts are verified correct at completion of the operation.   The patient received  a total of 40 mL of intravenous contrast during the procedure.  Sherren Mocha, MD 09/16/2019 12:21 PM

## 2019-09-16 NOTE — Anesthesia Procedure Notes (Signed)
Procedure Name: MAC Date/Time: 09/16/2019 10:10 AM Performed by: Amadeo Garnet, CRNA Pre-anesthesia Checklist: Patient identified, Emergency Drugs available, Suction available and Patient being monitored Patient Re-evaluated:Patient Re-evaluated prior to induction Oxygen Delivery Method: Simple face mask Preoxygenation: Pre-oxygenation with 100% oxygen Induction Type: IV induction Placement Confirmation: positive ETCO2 Dental Injury: Teeth and Oropharynx as per pre-operative assessment

## 2019-09-16 NOTE — Progress Notes (Signed)
Patient ID: Gregory [name], male   DOB: [birth year], 79 y.o.   MRN: [id number]  TCTS Evening Rounds:   Hemodynamically stable  VVI pacing at 60.  Awake and alert. Groin sites look good.  Urine output good     CBC    Component Value Date/Time   WBC 9.8 09/12/2019 1000   RBC 4.47 09/12/2019 1000   HGB 11.2 (L) 09/16/2019 1130   HGB 13.4 07/28/2019 1508   HCT 33.0 (L) 09/16/2019 1130   HCT 40.9 07/28/2019 1508   PLT 199 09/12/2019 1000   PLT 203 07/28/2019 1508   MCV 91.1 09/12/2019 1000   MCV 90 07/28/2019 1508   MCH 29.3 09/12/2019 1000   MCHC 32.2 09/12/2019 1000   RDW 14.6 09/12/2019 1000   RDW 14.2 07/28/2019 1508     BMET    Component Value Date/Time   NA 137 09/16/2019 1130   NA 140 07/28/2019 1508   K 4.6 09/16/2019 1130   CL 100 09/16/2019 1130   CO2 19 (L) 09/12/2019 1000   GLUCOSE 163 (H) 09/16/2019 1130   BUN 14 09/16/2019 1130   BUN 20 07/28/2019 1508   CREATININE 1.20 09/16/2019 1130   CALCIUM 8.9 09/12/2019 1000   GFRNONAA >60 09/12/2019 1000   GFRAA >60 09/12/2019 1000     A/P:  Stable postop course. Continue current plans. Will need permanent pacer.

## 2019-09-16 NOTE — Progress Notes (Signed)
  [facility] VALVE TEAM  Patient doing well s/p TAVR. He is hemodynamically stable. Very sedated still. Groin sites stable. ECG with paced rhythm. Pt has no underlying intrinsic rhythm and will require PPM. EP has been consulted. Right IJ temp wire in place and working normally.   Angelena Form PA-C  [facility]  Pager [phone]

## 2019-09-16 NOTE — Transfer of Care (Signed)
Immediate Anesthesia Transfer of Care Note  Patient: Gregory [name]  Procedure(s) Performed: TRANSCATHETER AORTIC VALVE REPLACEMENT, TRANSFEMORAL (N/A ) TRANSESOPHAGEAL ECHOCARDIOGRAM (TEE) (N/A )  Patient Location: SICU  Anesthesia Type:MAC  Level of Consciousness: awake, alert  and oriented  Airway & Oxygen Therapy: Patient Spontanous Breathing and Patient connected to face mask oxygen  Post-op Assessment: Report given to RN, Post -op Vital signs reviewed and stable and Patient moving all extremities  Post vital signs: Reviewed and stable  Last Vitals:  Vitals Value Taken Time  BP    Temp    Pulse    Resp    SpO2      Last Pain:  Vitals:   09/16/19 0803  TempSrc:   PainSc: 0-No pain         Complications: No complications documented.

## 2019-09-16 NOTE — Op Note (Addendum)
[facility]   MULTIDISCIPLINARY HEART VALVE TEAM   TAVR OPERATIVE NOTE   Date of Procedure:  09/16/2019  Preoperative Diagnosis: Severe Aortic Stenosis   Postoperative Diagnosis: Same   Procedure:    Transcatheter Aortic Valve Replacement - Percutaneous Right Transfemoral Approach  Edwards Sapien 3 THV (size 29 mm, model # 9600TFX, serial # [id number])   Co-Surgeons:  Gaye Pollack, MD and Sherren Mocha, MD     Anesthesiologist:  Annye Asa, MD  Echocardiographer:  Jerilynn Mages. Croitoru, MD  Pre-operative Echo Findings:  Severe aortic stenosis  Normal left ventricular systolic function  Post-operative Echo Findings:  Mild paravalvular leak  Normal left ventricular systolic function   BRIEF CLINICAL NOTE AND INDICATIONS FOR SURGERY  This 79 year old gentleman has stage D, severe, symptomatic aortic stenosis with [name] class II symptoms of exertional fatigue and shortness of breath consistent with chronic diastolic congestive heart failure. I have personally reviewed his 2D echocardiogram, cardiac catheterization, and CTA studies. Echocardiogram shows a severely calcified aortic valve with thickened leaflets and restricted mobility. Aortic valve mean gradient is 44 mmHg consistent with severe aortic stenosis. There is mild dilatation of the ascending aorta at 41 mm. There is moderate calcification of the mitral valve leaflets with severe mitral annular calcification. There is mild to moderate mitral regurgitation and moderate mitral valve stenosis with a mitral valve peak gradient of 22.7 mmHg and a mean gradient of 6 mmHg. Cardiac catheterization shows no significant coronary disease. The mean gradient across aortic valve was measured at 46 mmHg. I agree that aortic valve replacement is indicated in this patient for relief of his symptoms and to prevent progressive left ventricular deterioration. Given his age and comorbid risk factors I think  transcatheter aortic valve replacement would be the best option for him. His gated cardiac CTA shows anatomy suitable for transcatheter aortic valve replacement using a SAPIEN 3 valve. His abdominal and pelvic CTA shows adequate pelvic vascular anatomy to allow transfemoral insertion.  The patient was counseled at length regarding treatment alternatives for management of severe symptomatic aortic stenosis. The risks and benefits of surgical intervention has been discussed in detail. Long-term prognosis with medical therapy was discussed. Alternative approaches such as conventional surgical aortic valve replacement, transcatheter aortic valve replacement, and palliative medical therapy were compared and contrasted at length. This discussion was placed in the context of the patient's own specific clinical presentation and past medical history. All of his questions have been addressed.  Following the decision to proceed with transcatheter aortic valve replacement, a discussion was held regarding what types of management strategies would be attempted intraoperatively in the event of life-threatening complications, including whether or not the patient would be considered a candidate for the use of cardiopulmonary bypass and/or conversion to open sternotomy for attempted surgical intervention. The patient is aware of the fact that transient use of cardiopulmonary bypass may be necessary. Given his low surgical risk I think he would be a candidate for emergent sternotomy to manage any intraoperative complications. The patient has been advised of a variety of complications that might develop including but not limited to risks of death, stroke, paravalvular leak, aortic dissection or other major vascular complications, aortic annulus rupture, device embolization, cardiac rupture or perforation, mitral regurgitation, acute myocardial infarction, arrhythmia, heart block or bradycardia requiring permanent pacemaker placement,  congestive heart failure, respiratory failure, renal failure, pneumonia, infection, other late complications related to structural valve deterioration or migration, or other complications that might ultimately cause  a temporary or permanent loss of functional independence or other long term morbidity. The patient provides full informed consent for the procedure as described and all questions were answered.     DETAILS OF THE OPERATIVE PROCEDURE  PREPARATION:    The patient was brought to the operating room on the above mentioned date and appropriate monitoring was established by the anesthesia team. The patient was placed in the supine position on the operating table.  Intravenous antibiotics were administered. The patient was monitored closely throughout the procedure under conscious sedation.  Baseline transthoracic echocardiogram was performed. The patient's abdomen and both groins were prepped and draped in a sterile manner. A time out procedure was performed.   PERIPHERAL ACCESS:    A right internal jugular sheath was placed using ultrasound guidance. A Tempo transvenous pacemaker catheter was passed through the sheath under fluoroscopic guidance into the right ventriclar apex and the nitinol fixation wire deployed.  The pacemaker was tested to ensure stable lead placement and pacemaker capture.   Using the modified Seldinger technique, femoral arterial  access was obtained with placement of a 6 Fr sheath on the left side.  A pigtail diagnostic catheter was passed through the arterial sheath under fluoroscopic guidance into the aortic root. Aortic root angiography was performed in order to determine the optimal angiographic angle for valve deployment.   TRANSFEMORAL ACCESS:   Percutaneous transfemoral access and sheath placement was performed using ultrasound guidance.  The right common femoral artery was cannulated using a micropuncture needle and appropriate location was verified using  hand injection angiogram.  A pair of Abbott Perclose percutaneous closure devices were placed and a 6 French sheath replaced into the femoral artery.  The patient was heparinized systemically and ACT verified > 250 seconds.    A 16 Fr transfemoral E-sheath was introduced into the right common femoral artery after progressively dilating over an Amplatz superstiff wire. An AL-2 catheter was used to direct a straight-tip exchange length wire across the native aortic valve into the left ventricle. This was exchanged out for a pigtail catheter and position was confirmed in the LV apex. Simultaneous LV and Ao pressures were recorded.  The pigtail catheter was exchanged for an Amplatz Extra-stiff wire in the LV apex.    BALLOON AORTIC VALVULOPLASTY:   Not performed  TRANSCATHETER HEART VALVE DEPLOYMENT:   An Edwards Sapien 3 transcatheter heart valve (size 29 mm, model #9600TFX, serial #[id number]) was prepared and crimped per manufacturer's guidelines, and the proper orientation of the valve is confirmed on the [employer] delivery system. The valve was advanced through the introducer sheath using normal technique until in an appropriate position in the abdominal aorta beyond the sheath tip. The balloon was then retracted and using the fine-tuning wheel was centered on the valve. The valve was then advanced across the aortic arch using appropriate flexion of the catheter. The valve was carefully positioned across the aortic valve annulus. The Commander catheter was retracted using normal technique. Once final position of the valve has been confirmed by angiographic assessment, the valve is deployed while temporarily holding ventilation and during rapid ventricular pacing to maintain systolic blood pressure < 50 mmHg and pulse pressure < 10 mmHg. The balloon inflation is held for >3 seconds after reaching full deployment volume. Once the balloon has fully deflated the balloon is retracted into the ascending  aorta and valve function is assessed using echocardiography. There is felt to be mild paravalvular leak and no central aortic insufficiency.  The  patient's hemodynamic recovery following valve deployment is good.  The deployment balloon and guidewire are both removed. There was no underlying ventricular rhythm after valve deployment and he required ventricular pacing. The pacing threshold was checked and was less than 1 mv.   PROCEDURE COMPLETION:   The sheath was removed and femoral artery closure performed.  Protamine was administered once femoral arterial repair was complete. The pigtail catheter and femoral sheath were removed with manual pressure used for hemostasis.  A Mynx femoral closure device was utilized following removal of the diagnostic sheath in the left femoral artery. The right IJ pacing catheter remained in place and the sheath was sewn in.  The patient tolerated the procedure well and is transported to the cath lab recovery area in stable condition. There were no immediate intraoperative complications. All sponge instrument and needle counts are verified correct at completion of the operation.   No blood products were administered during the operation.  The patient received a total of 40 mL of intravenous contrast during the procedure.   Gaye Pollack, MD 09/16/2019

## 2019-09-16 NOTE — Anesthesia Postprocedure Evaluation (Signed)
Anesthesia Post Note  Patient: [name]  Procedure(s) Performed: TRANSCATHETER AORTIC VALVE REPLACEMENT, TRANSFEMORAL (N/A ) TRANSESOPHAGEAL ECHOCARDIOGRAM (TEE) (N/A )     Patient location during evaluation: SICU Anesthesia Type: MAC Post-procedure mental status: sleeping. Pain management: pain level controlled Vital Signs Assessment: post-procedure vital signs reviewed and stable Respiratory status: spontaneous breathing, nonlabored ventilation, respiratory function stable and patient connected to nasal cannula oxygen Cardiovascular status: stable and blood pressure returned to baseline (still pacing) Postop Assessment: no apparent nausea or vomiting Anesthetic complications: no   No complications documented.  Last Vitals:  Vitals:   09/16/19 1251 09/16/19 1300  BP: (!) 114/57 (!) 110/52  Pulse:  60  Resp:  (!) 22  Temp:    SpO2:  96%    Last Pain:  Vitals:   09/16/19 1251  TempSrc:   PainSc: Asleep                 Harwood Nall,E. Mabrey Howland

## 2019-09-17 ENCOUNTER — Inpatient Hospital Stay (HOSPITAL_COMMUNITY)
Admission: RE | Disposition: A | Discharge status: Home / Self Care | Payer: Self-pay | Source: Home / Self Care | Attending: Surgery

## 2019-09-17 ENCOUNTER — Encounter (HOSPITAL_COMMUNITY): Payer: Self-pay | Admitting: Cardiovascular Disease

## 2019-09-17 ENCOUNTER — Inpatient Hospital Stay (HOSPITAL_COMMUNITY): Payer: PPO

## 2019-09-17 DIAGNOSIS — I350 Nonrheumatic aortic (valve) stenosis: Secondary | ICD-10-CM

## 2019-09-17 DIAGNOSIS — I442 Atrioventricular block, complete: Secondary | ICD-10-CM

## 2019-09-17 DIAGNOSIS — Z954 Presence of other heart-valve replacement: Secondary | ICD-10-CM

## 2019-09-17 DIAGNOSIS — Z952 Presence of prosthetic heart valve: Secondary | ICD-10-CM

## 2019-09-17 HISTORY — PX: PACEMAKER IMPLANT: EP1218

## 2019-09-17 LAB — ECHOCARDIOGRAM COMPLETE
Height: 70.5 in
Weight: 3952 oz

## 2019-09-17 LAB — BASIC METABOLIC PANEL
Anion gap: 10 (ref 5–15)
BUN: 10 mg/dL (ref 8–23)
CO2: 21 mmol/L — ABNORMAL LOW (ref 22–32)
Calcium: 8.4 mg/dL — ABNORMAL LOW (ref 8.9–10.3)
Chloride: 104 mmol/L (ref 98–111)
Creatinine, Ser: 0.98 mg/dL (ref 0.61–1.24)
GFR calc Af Amer: >60 mL/min (ref >60)
GFR calc non Af Amer: >60 mL/min (ref >60)
Glucose, Bld: 122 mg/dL — ABNORMAL HIGH (ref 70–99)
Potassium: 4.4 mmol/L (ref 3.5–5.1)
Sodium: 135 mmol/L (ref 135–145)

## 2019-09-17 LAB — POCT I-STAT, CHEM 8
BUN: 14 mg/dL (ref 8–23)
Calcium, Ion: 1.18 mmol/L (ref 1.15–1.40)
Chloride: 101 mmol/L (ref 98–111)
Creatinine, Ser: 1.1 mg/dL (ref 0.61–1.24)
Glucose, Bld: 149 mg/dL — ABNORMAL HIGH (ref 70–99)
HCT: 35 % — ABNORMAL LOW (ref 39.0–52.0)
Hemoglobin: 11.9 g/dL — ABNORMAL LOW (ref 13.0–17.0)
Potassium: 4.7 mmol/L (ref 3.5–5.1)
Sodium: 137 mmol/L (ref 135–145)
TCO2: 22 mmol/L (ref 22–32)

## 2019-09-17 LAB — GLUCOSE, CAPILLARY
Glucose-Capillary: 102 mg/dL — ABNORMAL HIGH (ref 70–99)
Glucose-Capillary: 104 mg/dL — ABNORMAL HIGH (ref 70–99)
Glucose-Capillary: 112 mg/dL — ABNORMAL HIGH (ref 70–99)
Glucose-Capillary: 101 mg/dL — ABNORMAL HIGH (ref 70–99)
Glucose-Capillary: 176 mg/dL — ABNORMAL HIGH (ref 70–99)

## 2019-09-17 LAB — CBC
HCT: 37.6 % — ABNORMAL LOW (ref 39.0–52.0)
Hemoglobin: 12.1 g/dL — ABNORMAL LOW (ref 13.0–17.0)
MCH: 29.4 pg (ref 26.0–34.0)
MCHC: 32.2 g/dL (ref 30.0–36.0)
MCV: 91.5 fL (ref 80.0–100.0)
Platelets: 156 10*3/uL (ref 150–400)
RBC: 4.11 MIL/uL — ABNORMAL LOW (ref 4.22–5.81)
RDW: 14.7 % (ref 11.5–15.5)
WBC: 11.1 10*3/uL — ABNORMAL HIGH (ref 4.0–10.5)
nRBC: 0 % (ref 0.0–0.2)

## 2019-09-17 LAB — MAGNESIUM: Magnesium: 1.6 mg/dL — ABNORMAL LOW (ref 1.7–2.4)

## 2019-09-17 SURGERY — PACEMAKER IMPLANT

## 2019-09-17 MED ORDER — KETOROLAC TROMETHAMINE 0.5 % OP SOLN
1.0000 [drp] | Freq: Three times a day (TID) | OPHTHALMIC | Status: AC | PRN
  Administered 2019-09-17: 1 [drp] via OPHTHALMIC
  Filled 2019-09-17: qty 5

## 2019-09-17 MED ORDER — FENTANYL CITRATE (PF) 100 MCG/2ML IJ SOLN
INTRAMUSCULAR | Status: AC
  Filled 2019-09-17: qty 2

## 2019-09-17 MED ORDER — SODIUM CHLORIDE 0.9 % IV SOLN
INTRAVENOUS | Status: AC
  Filled 2019-09-17: qty 2

## 2019-09-17 MED ORDER — CEFAZOLIN SODIUM-DEXTROSE 2-4 GM/100ML-% IV SOLN
2.0000 g | INTRAVENOUS | Status: AC
  Administered 2019-09-17: 2 g via INTRAVENOUS
  Filled 2019-09-17: qty 100

## 2019-09-17 MED ORDER — ACETAMINOPHEN 325 MG PO TABS: 325.0000 mg | ORAL_TABLET | ORAL | Status: DC | PRN

## 2019-09-17 MED ORDER — HEPARIN (PORCINE) IN NACL 1000-0.9 UT/500ML-% IV SOLN
INTRAVENOUS | Status: AC
  Filled 2019-09-17: qty 500

## 2019-09-17 MED ORDER — SODIUM CHLORIDE 0.9 % IV SOLN
80.0000 mg | INTRAVENOUS | Status: AC
  Administered 2019-09-17: 80 mg
  Filled 2019-09-17: qty 2

## 2019-09-17 MED ORDER — CHLORHEXIDINE GLUCONATE 4 % EX LIQD: 4.0000 "application " | Freq: Once | CUTANEOUS | Status: AC

## 2019-09-17 MED ORDER — SODIUM CHLORIDE 0.9% FLUSH: 3.0000 mL | INTRAVENOUS | Status: DC | PRN

## 2019-09-17 MED ORDER — MIDAZOLAM HCL 5 MG/5ML IJ SOLN
INTRAMUSCULAR | Status: AC
  Filled 2019-09-17: qty 5

## 2019-09-17 MED ORDER — SODIUM CHLORIDE 0.9 % IV SOLN
1.5000 g | Freq: Two times a day (BID) | INTRAVENOUS | Status: AC
  Administered 2019-09-17 – 2019-09-18 (×2): 1.5 g via INTRAVENOUS
  Filled 2019-09-17 (×2): qty 1.5

## 2019-09-17 MED ORDER — MIDAZOLAM HCL 5 MG/5ML IJ SOLN
INTRAMUSCULAR | Status: DC | PRN
  Administered 2019-09-17 (×3): 1 mg via INTRAVENOUS

## 2019-09-17 MED ORDER — IOHEXOL 350 MG/ML SOLN
INTRAVENOUS | Status: DC | PRN
  Administered 2019-09-17: 15 mL via INTRAVENOUS
  Administered 2019-09-17: 20 mL via INTRAVENOUS

## 2019-09-17 MED ORDER — HEPARIN (PORCINE) IN NACL 1000-0.9 UT/500ML-% IV SOLN
INTRAVENOUS | Status: DC | PRN
  Administered 2019-09-17: 500 mL

## 2019-09-17 MED ORDER — SODIUM CHLORIDE 0.9 % IV SOLN: 250.0000 mL | INTRAVENOUS | Status: DC

## 2019-09-17 MED ORDER — SODIUM CHLORIDE 0.9 % IV SOLN: INTRAVENOUS | Status: DC | PRN

## 2019-09-17 MED ORDER — LIDOCAINE HCL 1 % IJ SOLN
INTRAMUSCULAR | Status: AC
  Filled 2019-09-17: qty 60

## 2019-09-17 MED ORDER — FENTANYL CITRATE (PF) 100 MCG/2ML IJ SOLN
INTRAMUSCULAR | Status: DC | PRN
  Administered 2019-09-17 (×3): 25 ug via INTRAVENOUS

## 2019-09-17 MED ORDER — CEFAZOLIN SODIUM-DEXTROSE 2-4 GM/100ML-% IV SOLN
INTRAVENOUS | Status: AC
  Filled 2019-09-17: qty 100

## 2019-09-17 MED ORDER — SODIUM CHLORIDE 0.9% FLUSH: 10.0000 mL | INTRAVENOUS | Status: DC | PRN

## 2019-09-17 MED ORDER — SODIUM CHLORIDE 0.9 % IV SOLN: INTRAVENOUS | Status: DC

## 2019-09-17 MED ORDER — SODIUM CHLORIDE 0.9% FLUSH
3.0000 mL | Freq: Two times a day (BID) | INTRAVENOUS | Status: DC
  Administered 2019-09-17 – 2019-09-19 (×4): 3 mL via INTRAVENOUS

## 2019-09-17 MED ORDER — LIDOCAINE HCL (PF) 1 % IJ SOLN
INTRAMUSCULAR | Status: DC | PRN
  Administered 2019-09-17: 50 mL

## 2019-09-17 MED ORDER — ONDANSETRON HCL 4 MG/2ML IJ SOLN: 4.0000 mg | Freq: Four times a day (QID) | INTRAMUSCULAR | Status: DC | PRN

## 2019-09-17 MED ORDER — BSS IO SOLN
15.0000 mL | Freq: Once | INTRAOCULAR | Status: AC
  Administered 2019-09-17: 15 mL
  Filled 2019-09-17: qty 15

## 2019-09-17 MED ORDER — CEFAZOLIN SODIUM-DEXTROSE 1-4 GM/50ML-% IV SOLN
1.0000 g | Freq: Four times a day (QID) | INTRAVENOUS | Status: DC
Start: 2019-09-17 — End: 2019-09-17

## 2019-09-17 SURGICAL SUPPLY — 9 items
CABLE SURGICAL S-101-97-12 (CABLE) ×2 IMPLANT
IPG PACE AZUR XT SR MRI W1SR01 (Pacemaker) IMPLANT
KIT MICROPUNCTURE NIT STIFF (SHEATH) ×1 IMPLANT
LEAD CAPSURE NOVUS 5076-58CM (Lead) ×1 IMPLANT
PACE AZURE XT SR MRI W1SR01 (Pacemaker) ×2 IMPLANT
PAD PRO RADIOLUCENT 2001M-C (PAD) ×2 IMPLANT
SHEATH 7FR PRELUDE SNAP 13 (SHEATH) ×3 IMPLANT
TRAY PACEMAKER INSERTION (PACKS) ×2 IMPLANT
WIRE HI TORQ VERSACORE-J 145CM (WIRE) ×1 IMPLANT

## 2019-09-17 NOTE — Addendum Note (Signed)
Addendum  created 09/17/19 1357 by Lillia Abed, MD   Order list changed

## 2019-09-17 NOTE — Consult Note (Addendum)
Cardiology Consultation:   Patient ID: Gregory [name] MRN: [id number]; DOB: 1940/04/28  Admit date: 09/16/2019 Date of Consult: 09/17/2019  Primary Care Provider: Kathyrn Lass, MD [facility] Cardiologist: Larae Grooms, MD  [facility] Electrophysiologist:  None    Patient Profile:   Gregory [name] is a 79 y.o. male with a hx of permanent AFib, RBBB, chronic CHF (diastolic), DM, HTN,  non-Hodgkin's lymphoma, PUD, and VHD w/mod MR/MS and severe AS  who is being seen today for the evaluation of post-op CHB at the request of Dr. Burt Knack.  History of Present Illness:   Gregory [name] was admitted yesterday to under go TAVR procedure. Pre-op he had known RBBB, inter-op with deployment of his AV he had CHB, remains with temp wire pacing.  EP was asked to see, in d/w Dr. Burt Knack, he does not expect conduction to return.  LABS K+ 4.4 Mag 1.6 BUN/Creat 10/0.98 WBC 11.1 H/H 12/37 Plts 156  Yesterday INR was 1.3 (warfarin not yet resumed)  He feels well this AM, no CP or SOB.  He is having some irritation of his R eye post procedure (RN reports anesthesia Birtha Hatler come to evaluate). He tells me he was told he may need a pacemaker.  Home medicines include  diltiazem 120mg  daily (last dose was 09/15/19 about 2200) Toprol 100mg  daily (last dose 09/15/19 about 1000)  No nodal blocking agents here   Past Medical History:  Diagnosis Date   Amputation finger    Distal 4th & 5th digits of right hand   Arthritis    Chronic atrial fibrillation (HCC)    Chronic diastolic CHF (congestive heart failure) ([place])    Diabetes mellitus type 2, noninsulin dependent ([place])    History of blood transfusion    5 pints for peptic ulcer   History of kidney stones    Passed x 2   Hypertension    Mitral valve disease    mild-moderate MR, moderate MS 07/15/19 echo   Other malignant lymphomas, unspecified site, extranodal and solid organ sites    Non Hodkins Lymothopathy   Ruptured disk    S/P TAVR  (transcatheter aortic valve replacement) 09/16/2019   s/p TAVR with a 29 mm Edwards Sapien 3 THV via the TF approach by Dr. Burt Knack and Dr. Cyndia Bent   Severe aortic stenosis    Ulcer    peptic Ulcer - bleeding    Past Surgical History:  Procedure Laterality Date   APPENDECTOMY  1967   COLONOSCOPY     left knee surgery     left shoulder surgery     MULTIPLE EXTRACTIONS WITH ALVEOLOPLASTY N/A 09/04/2019   Procedure: Extraction of tooth #'s 2,7,8,9,11,13,14 20,30, and 31 with alveoloplatsty and gross debridement of remaining teeth;  Surgeon: Lenn Cal, DDS;  Location: [place];  Service: Oral Surgery;  Laterality: N/A;   RIGHT/LEFT HEART CATH AND CORONARY ANGIOGRAPHY N/A 08/12/2019   Procedure: RIGHT/LEFT HEART CATH AND CORONARY ANGIOGRAPHY;  Surgeon: Jettie Booze, MD;  Location: [facility];  Service: Cardiovascular;  Laterality: N/A;   TEE WITHOUT CARDIOVERSION N/A 09/16/2019   Procedure: TRANSESOPHAGEAL ECHOCARDIOGRAM (TEE);  Surgeon: Sherren Mocha, MD;  Location: [place] [facility];  Service: Open Heart Surgery;  Laterality: N/A;   TONSILLECTOMY     TRANSCATHETER AORTIC VALVE REPLACEMENT, TRANSFEMORAL N/A 09/16/2019   Procedure: TRANSCATHETER AORTIC VALVE REPLACEMENT, TRANSFEMORAL;  Surgeon: Sherren Mocha, MD;  Location: [place] [facility];  Service: Open Heart Surgery;  Laterality: N/A;  Home Medications:  Prior to Admission medications   Medication Sig Start Date End Date Taking? Authorizing Provider  atorvastatin (LIPITOR) 20 MG tablet Take 20 mg by mouth daily at 6 PM.  04/22/12  Yes [provider]  diltiazem (CARDIZEM CD) 120 MG 24 hr capsule Take 120 mg by mouth daily at 6 PM. 06/03/15  Yes [provider]  erythromycin ophthalmic ointment Place 1 application into the left eye 3 (three) times daily. Place ointment to the lower area of the   Yes [provider]  lisinopril (PRINIVIL,ZESTRIL) 10 MG tablet Take 10 mg by mouth daily.  04/08/18  Yes [provider]  metoprolol succinate (TOPROL-XL) 100 MG 24 hr tablet Take 100 mg by mouth daily. Take with or immediately following a meal.   Yes [provider]  Multiple Vitamins-Minerals (PRESERVISION AREDS 2 PO) Take 1 tablet by mouth daily.   Yes [provider]  ONE TOUCH ULTRA TEST test strip 1 each by Other route as needed (BLOOD SUGAR).  07/14/15  Yes [provider]  Jonetta Speak LANCETS 54U MISC Apply 1 each topically daily as needed (BLOOD SUGAR).  07/14/15  Yes [provider]  acetaminophen (TYLENOL) 650 MG CR tablet Take 1,300 mg by mouth every 8 (eight) hours as needed for pain.    [provider]  HYDROcodone-acetaminophen (NORCO) 5-325 MG tablet Take 1 tablet by mouth every 4 (four) hours as needed for moderate pain or severe pain. 09/04/19   Lenn Cal, DDS  metFORMIN (GLUCOPHAGE-XR) 500 MG 24 hr tablet Take 1 tablet (500 mg total) by mouth 2 (two) times daily. Patient taking differently: Take 1,000 mg by mouth 2 (two) times daily.  08/14/19   Jettie Booze, MD  pioglitazone (ACTOS) 15 MG tablet Take 15 mg by mouth daily.     [provider]  warfarin (COUMADIN) 6 MG tablet Take 3-6 mg by mouth See admin instructions. Take 0.5 tablet (3 mg) by mouth on Thursdays & Saturdays & take 1 tablet (6 mg) by mouth on Sundays, Mondays, Tuesdays, Wednesdays, & Fridays at night.    [provider]    Inpatient Medications: Scheduled Meds:  aspirin  81 mg Oral Daily   atorvastatin  20 mg Oral q1800   Chlorhexidine Gluconate Cloth  6 each Topical Daily   insulin aspart  0-24 Units Subcutaneous TID AC & HS   sodium chloride flush  3 mL Intravenous Q12H   Continuous Infusions:  sodium chloride 10 mL/hr at 09/17/19 0700   cefUROXime (ZINACEF)  IV Stopped (09/17/19 0436)   nitroGLYCERIN Stopped (09/16/19 1246)   phenylephrine (NEO-SYNEPHRINE) Adult infusion 20 mcg/min (09/17/19 0600)   PRN  Meds: sodium chloride, acetaminophen **OR** acetaminophen, morphine injection, ondansetron (ZOFRAN) IV, oxyCODONE, sodium chloride flush, sodium chloride flush, traMADol  Allergies:    Allergies  Allergen Reactions   Macrobid [Nitrofurantoin]    Nystatin    Prevnar 13 [Pneumococcal 13-Val Conj Vacc]    Bisoprolol-Hydrochlorothiazide Other (See Comments)    "Severe" arthralgias and myalgias   Nsaids Other (See Comments)    GI upset, Peptic ulcer disease    Social History:   Social History   Socioeconomic History   Marital status: Single    Spouse name: Not on file   Number of children: 0   Years of education: Not on file   Highest education level: Not on file  Occupational History   Occupation: Retired-Trucking  Tobacco Use   Smoking status: Never Smoker  Smokeless tobacco: Never Used  Vaping Use   Vaping Use: Never used  Substance and Sexual Activity   Alcohol use: No   Drug use: No   Sexual activity: Never  Other Topics Concern   Not on file  Social History Narrative   Not on file   Social Determinants of Health   Financial Resource Strain:    Difficulty of Paying Living Expenses:   Food Insecurity:    Worried About Charity fundraiser in the Last Year:    Arboriculturist in the Last Year:   Transportation Needs:    Film/video editor (Medical):    Lack of Transportation (Non-Medical):   Physical Activity:    Days of Exercise per Week:    Minutes of Exercise per Session:   Stress:    Feeling of Stress :   Social Connections:    Frequency of Communication with Friends and Family:    Frequency of Social Gatherings with Friends and Family:    Attends Religious Services:    Active Member of Clubs or Organizations:    Attends Music therapist:    Marital Status:   Intimate Partner Violence:    Fear of Current or Ex-Partner:    Emotionally Abused:    Physically Abused:    Sexually Abused:     Family History:   Family History  Problem  Relation Age of Onset   Hypertension Father    Heart attack Father    Heart attack Mother    Cancer Mother        LUNG   Stroke Neg Hx      ROS:  Please see the history of present illness.  All other ROS reviewed and negative.     Physical Exam/Data:   Vitals:   09/17/19 0845 09/17/19 0900 09/17/19 0915 09/17/19 0930  BP: (!) 118/41 (!) 129/46 (!) 124/42 (!) 125/43  Pulse: 60 60 60 60  Resp: (!) 29 14 (!) 25 (!) 29  Temp:      TempSrc:      SpO2: 94% 95% 94% 93%  Weight:      Height:        Intake/Output Summary (Last 24 hours) at 09/17/2019 1023 Last data filed at 09/17/2019 0600 Gross per 24 hour  Intake 1852 ml  Output 805 ml  Net 1047 ml   Last 3 Weights 09/16/2019 09/12/2019 09/04/2019  Weight (lbs) 247 lb 252 lb 9 oz 259 lb 0.7 oz  Weight (kg) 112.038 kg 114.562 kg 117.5 kg     Body mass index is 34.94 kg/m.  General:  Well nourished, well developed, in no acute distress HEENT: normal Lymph: no adenopathy Neck: no JVD, R IJ temp wire Endocrine:  No thryomegaly Vascular: No carotid bruits Cardiac:  RRR; sift SM, no gallops or rubs Lungs: CTA b/l, no wheezing, rhonchi or rales  Abd: soft, nontender, obese Ext: no edema, chronic looking skin changes Musculoskeletal:  No deformities, BUE and BLE strength normal and equal Skin: warm and dry  Neuro:  CNs 2-12 intact, no focal abnormalities noted Psych:  Normal affect   EKG:  The EKG was personally reviewed and demonstrates:   09/16/2019 V paced 09/12/2019 AFib 77bpm, RBBB  Telemetry:  Telemetry was personally reviewed and demonstrates:   V paced, occ PVCs/intrinsic beats?  Relevant CV Studies:   09/16/2019: TEE                  Normal left ventricular systolic  function, Estimated LV                   ejection fraction 55-60%. Normal regional wall motion.                   Trileaflet aortic valve with severe calcific stenosis.  Trivial                   aortic regurgitation.                   Peak aortic  valve gradient 81 mm Hg, mean gradient 56 mm  Hg,                   dimensionless obstructive index 0.13, estimated aortic  valve                   area 0.5 cm.                   Severe degenerative mitral valve changes with mitral  stenosis                   (not quantified) and mild-moderate mitral insufficiency.                   No pericardial effusion.                      POST-PROCEDURE FINDINGS:                      Normal left ventricular systolic function, Estimated LV                   ejection fraction 55-60%. Normal regional wall motion.                   Well seated aortic stent-valve TAVR. There is mild  perivalvular                   leak at the level of the native left-noncoronary  commissure.                   Peak aortic valve gradient 4 mm Hg, mean gradient 2 mm  Hg,                   dimensionless obstructive index 0.59, estimated aortic  valve                   area 3.0 cm.                   Severe degenerative mitral valve changes with mitral  stenosis                   (not quantified) and mild-moderate mitral insufficiency                   (unchanged).                   No pericardial effusion.   IMPRESSIONS  1. Left ventricular ejection fraction, by estimation, is 55 to 60%. The  left ventricle has normal function. The left ventricle has no regional  wall motion abnormalities. There is mild concentric left ventricular  hypertrophy. Left ventricular diastolic  function could not be evaluated.   2. Right ventricular systolic function is normal. The right ventricular  size is normal.   3. Left atrial size was mild  to moderately dilated.   4. Mild to moderate mitral valve regurgitation.   5. Aortic valve regurgitation is mild. There is a 29 mm Edwards Edwards  Sapien prosthetic (TAVR) valve present in the aortic position. Procedure  Date: 09/16/2019. Echo findings are consistent with perivalvular leak of  the aortic prosthesis.   FINDINGS   Left  Ventricle: Left ventricular ejection fraction, by estimation, is 55  to 60%. The left ventricle has normal function. The left ventricle has no  regional wall motion abnormalities. There is mild concentric left  ventricular hypertrophy. Left  ventricular diastolic function could not be evaluated due to atrial  fibrillation.   Right Ventricle: The right ventricular size is normal. No increase in  right ventricular wall thickness. Right ventricular systolic function is  normal.   Left Atrium: Left atrial size was mild to moderately dilated.   Right Atrium: Right atrial size was not well visualized.   Pericardium: There is no evidence of pericardial effusion.   Mitral Valve: There is severe thickening of the mitral valve leaflet(s).  There is moderate calcification of the mitral valve leaflet(s). Moderately  decreased mobility of the mitral valve leaflets. Moderate to severe mitral  annular calcification. Mild to  moderate mitral valve regurgitation, with centrally-directed jet.   Tricuspid Valve: The tricuspid valve is not well visualized. Tricuspid  valve regurgitation is not demonstrated.   Aortic Valve: Aortic valve regurgitation is mild. Aortic valve mean  gradient measures 29.0 mmHg. Aortic valve peak gradient measures 41.0  mmHg. Aortic valve area, by VTI measures 0.87 cm. There is a 29 mm  Edwards Edwards Sapien prosthetic, stented  (TAVR) valve present in the aortic position. Procedure Date: 09/16/2019.   Pulmonic Valve: The pulmonic valve was grossly normal. Pulmonic valve  regurgitation is trivial.   Aorta: The aortic root is normal in size and structure.   IAS/Shunts: The interatrial septum was not assessed.     08/12/2019: LHC The left ventricular systolic function is normal. LV end diastolic pressure is normal. The left ventricular ejection fraction is 55-65% by visual estimate. There is severe aortic valve stenosis. Mean gradient 46 mm Hg. There is mild mitral  valve stenosis. Mitral valve area 2.1 cm2. Mean gradient 11 mm Hg. Hemodynamic findings consistent with mild pulmonary hypertension. No significant CAD.   No significant CAD.  Plan for TAVR w/u.     Laboratory Data:  High Sensitivity Troponin:  No results for input(s): TROPONINIHS in the last 720 hours.   Chemistry Recent Labs  Lab 09/12/19 1000 09/16/19 1013 09/16/19 1130 09/16/19 1246 09/17/19 0458  NA 134*   < > 137 137 135  K 4.7   < > 4.6 4.7 4.4  CL 104   < > 100 101 104  CO2 19*  --   --   --  21*  GLUCOSE 136*   < > 163* 149* 122*  BUN 11   < > 14 14 10   CREATININE 0.98   < > 1.20 1.10 0.98  CALCIUM 8.9  --   --   --  8.4*  GFRNONAA >60  --   --   --  >60  GFRAA >60  --   --   --  >60  ANIONGAP 11  --   --   --  10   < > = values in this interval not displayed.    Recent Labs  Lab 09/12/19 1000  PROT 6.7  ALBUMIN 3.9  AST 29  ALT 24  ALKPHOS 78  BILITOT 1.0   Hematology Recent Labs  Lab 09/12/19 1000 09/16/19 1013 09/16/19 1130 09/16/19 1246 09/17/19 0458  WBC 9.8  --   --   --  11.1*  RBC 4.47  --   --   --  4.11*  HGB 13.1   < > 11.2* 11.9* 12.1*  HCT 40.7   < > 33.0* 35.0* 37.6*  MCV 91.1  --   --   --  91.5  MCH 29.3  --   --   --  29.4  MCHC 32.2  --   --   --  32.2  RDW 14.6  --   --   --  14.7  PLT 199  --   --   --  156   < > = values in this interval not displayed.   BNP Recent Labs  Lab 09/12/19 1000  BNP 295.1*    DDimer No results for input(s): DDIMER in the last 168 hours.   Radiology/Studies:   DG Chest Port 1 View Result Date: 09/16/2019 CLINICAL DATA:  Postop TAVR today. EXAM: PORTABLE CHEST 1 VIEW COMPARISON:  Radiographs 09/12/2019 and 06/12/2015.  CT 08/21/2019. FINDINGS: 1238 hours. Right IJ temporary pacer extends to the right ventricular apex. There is stable cardiomegaly post interval TAVR. There is vascular congestion with mild bibasilar atelectasis. No overt pulmonary edema, confluent airspace opacity,  pneumothorax or significant pleural effusion. The bones appear unchanged. IMPRESSION: Interval TAVR with mild vascular congestion and bibasilar atelectasis. No evidence of pneumothorax. Electronically Signed   By: Richardean Sale M.D.   On: 09/16/2019 12:54   { Assessment and Plan:   1. Post TAVR CHB     Baseline conduction system disease with RBBB     He remains pacer dependent this AM to 40bpm      He was on toprol and dilt at home Toprol has washed out Dilt Gregory [name] be washed out by this afternoon Despite medicines, given baseline RBBB and his AVR, likely that he Zandra Lajeunesse not regain conduction  I have discussed PPM implant with the patient, the procedure as well as risks and benefits.  He is agreeable to proceed  I have placed him on the procedure schedule for this afternoon Dr. Curt Bears Veleka Djordjevic see him later today   2. AFib     By Dr. Hassell Done note, rate control strategy, EKGs reviewed to 2019 are all AF     CHA2DS2Vasc is 5, on warfarin at home, not yet resumed here     For questions or updates, please contact [place] Please consult [email or web address] for contact info under    Signed, Baldwin Jamaica, PA-C  09/17/2019 10:23 AM  I have seen and examined this patient with Tommye Standard.  Agree with above, note added to reflect my findings.  On exam, RRR, no murmurs, lungs clear.  Patient admitted for TAVR implant.  Postop has had complete heart block.  Plan for pacemaker implant today.  Jase Himmelberger M. Tariah Transue MD 09/17/2019 3:26 PM

## 2019-09-17 NOTE — Progress Notes (Signed)
Pt arrived from PACU to 4E room 13 after permanent pacemaker placement w/Dr. Bartle/Cooper.  Telemetry monitor applied and CCMD notified.  CHG bath and skin assessment completed. Pt oriented to unit and room to include call light and phone.  Will continue to monitor.

## 2019-09-17 NOTE — Progress Notes (Signed)
  Echocardiogram 2D Echocardiogram has been performed.  Gregory [name] 09/17/2019, 9:23 AM

## 2019-09-17 NOTE — Progress Notes (Signed)
1 Day Post-Op Procedure(s) (LRB): TRANSCATHETER AORTIC VALVE REPLACEMENT, TRANSFEMORAL (N/A) TRANSESOPHAGEAL ECHOCARDIOGRAM (TEE) (N/A) Subjective: No complaints  Objective: Vital signs in last 24 hours: Temp:  [97.4 F (36.3 C)-98.7 F (37.1 C)] 98.5 F (36.9 C) (06/16 0640) Pulse Rate:  [0-295] 60 (06/16 0700) Cardiac Rhythm: Ventricular paced (06/16 0400) Resp:  [0-51] 25 (06/16 0700) BP: (97-124)/(35-63) 124/46 (06/16 0700) SpO2:  [0 %-99 %] 94 % (06/16 0700) Arterial Line BP: (110-164)/(33-46) 140/36 (06/16 0700)  Hemodynamic parameters for last 24 hours:    Intake/Output from previous day: 06/15 0701 - 06/16 0700 In: 2252 [P.O.:180; I.V.:1273.2; IV Piggyback:798.8] Out: 805 [Urine:800; Blood:5] Intake/Output this shift: No intake/output data recorded.  General appearance: alert and cooperative Neurologic: intact Heart: regular rate and rhythm, S1, S2 normal, no murmur Lungs: clear to auscultation bilaterally Extremities: extremities normal, no edema Wound: groin sites ok  Lab Results: Recent Labs    09/16/19 1246 09/17/19 0458  WBC  --  11.1*  HGB 11.9* 12.1*  HCT 35.0* 37.6*  PLT  --  156   BMET:  Recent Labs    09/16/19 1246 09/17/19 0458  NA 137 135  K 4.7 4.4  CL 101 104  CO2  --  21*  GLUCOSE 149* 122*  BUN 14 10  CREATININE 1.10 0.98  CALCIUM  --  8.4*    PT/INR:  Recent Labs    09/16/19 0832  LABPROT 15.4*  INR 1.3*   ABG    Component Value Date/Time   PHART 7.421 09/12/2019 0955   HCO3 22.5 09/12/2019 0955   TCO2 22 09/16/2019 1246   ACIDBASEDEF 1.3 09/12/2019 0955   O2SAT 98.6 09/12/2019 0955   CBG (last 3)  Recent Labs    09/16/19 1555 09/16/19 2122 09/17/19 0640  GLUCAP 110* 123* 102*    Assessment/Plan: S/P Procedure(s) (LRB): TRANSCATHETER AORTIC VALVE REPLACEMENT, TRANSFEMORAL (N/A) TRANSESOPHAGEAL ECHOCARDIOGRAM (TEE) (N/A)  POD 1  Hemodynamically stable on neo. Wean for MAP 60 by cuff. DC arterial  line.  Postop complete heart block: his rhythm preop was chronic atrial fib with RBBB. Pacer turned down to 30 and only paced beats. Will need PPM. EP to see this morning.  2D echo today.     LOS: 1 day    Gaye Pollack 09/17/2019

## 2019-09-17 NOTE — H&P (Signed)
Gregory [name] has presented today for surgery, with the diagnosis of complete AV block.  The various methods of treatment have been discussed with the patient and family. After consideration of risks, benefits and other options for treatment, the patient has consented to  Procedure(s): Pacemaker implant as a surgical intervention .  Risks include but not limited to bleeding, tamponade, infection, pneumothorax, among others. The patient's history has been reviewed, patient examined, no change in status, stable for surgery.  I have reviewed the patient's chart and labs.  Questions were answered to the patient's satisfaction.    Youssouf Shipley Curt Bears, MD 09/17/2019 3:28 PM

## 2019-09-17 NOTE — Progress Notes (Signed)
Spoke with CRNA about patient's left eye irritation after TAVR yesterday. Awaiting orders.

## 2019-09-18 ENCOUNTER — Inpatient Hospital Stay (HOSPITAL_COMMUNITY): Payer: PPO

## 2019-09-18 ENCOUNTER — Encounter (HOSPITAL_COMMUNITY): Payer: Self-pay | Admitting: Cardiology

## 2019-09-18 LAB — BASIC METABOLIC PANEL
Anion gap: 10 (ref 5–15)
BUN: 13 mg/dL (ref 8–23)
CO2: 21 mmol/L — ABNORMAL LOW (ref 22–32)
Calcium: 8.2 mg/dL — ABNORMAL LOW (ref 8.9–10.3)
Chloride: 105 mmol/L (ref 98–111)
Creatinine, Ser: 1.02 mg/dL (ref 0.61–1.24)
GFR calc Af Amer: >60 mL/min (ref >60)
GFR calc non Af Amer: >60 mL/min (ref >60)
Glucose, Bld: 103 mg/dL — ABNORMAL HIGH (ref 70–99)
Potassium: 4.1 mmol/L (ref 3.5–5.1)
Sodium: 136 mmol/L (ref 135–145)

## 2019-09-18 LAB — CBC
HCT: 35.2 % — ABNORMAL LOW (ref 39.0–52.0)
Hemoglobin: 11.5 g/dL — ABNORMAL LOW (ref 13.0–17.0)
MCH: 29.6 pg (ref 26.0–34.0)
MCHC: 32.7 g/dL (ref 30.0–36.0)
MCV: 90.5 fL (ref 80.0–100.0)
Platelets: 128 10*3/uL — ABNORMAL LOW (ref 150–400)
RBC: 3.89 MIL/uL — ABNORMAL LOW (ref 4.22–5.81)
RDW: 14.8 % (ref 11.5–15.5)
WBC: 9.6 10*3/uL (ref 4.0–10.5)
nRBC: 0 % (ref 0.0–0.2)

## 2019-09-18 LAB — GLUCOSE, CAPILLARY
Glucose-Capillary: 94 mg/dL (ref 70–99)
Glucose-Capillary: 256 mg/dL — ABNORMAL HIGH (ref 70–99)
Glucose-Capillary: 124 mg/dL — ABNORMAL HIGH (ref 70–99)
Glucose-Capillary: 122 mg/dL — ABNORMAL HIGH (ref 70–99)

## 2019-09-18 MED ORDER — OXYCODONE HCL 5 MG PO TABS: 5.0000 mg | ORAL_TABLET | ORAL | Status: DC | PRN

## 2019-09-18 MED ORDER — ADULT MULTIVITAMIN W/MINERALS CH
1.0000 | ORAL_TABLET | Freq: Every day | ORAL | Status: DC
  Administered 2019-09-18 – 2019-09-19 (×2): 1 via ORAL
  Filled 2019-09-18 (×2): qty 1

## 2019-09-18 MED ORDER — WARFARIN SODIUM 5 MG PO TABS
6.0000 mg | ORAL_TABLET | Freq: Once | ORAL | Status: AC
  Administered 2019-09-18: 6 mg via ORAL
  Filled 2019-09-18: qty 1

## 2019-09-18 MED ORDER — WARFARIN - PHARMACIST DOSING INPATIENT: Freq: Every day | Status: DC

## 2019-09-18 MED ORDER — TRAMADOL HCL 50 MG PO TABS: 50.0000 mg | ORAL_TABLET | ORAL | Status: DC | PRN

## 2019-09-18 MED ORDER — ENSURE ENLIVE PO LIQD: 237.0000 mL | Freq: Two times a day (BID) | ORAL | Status: DC

## 2019-09-18 MED FILL — Lidocaine HCl Local Inj 1%: INTRAMUSCULAR | Qty: 60 | Status: AC

## 2019-09-18 NOTE — Progress Notes (Addendum)
Electrophysiology Rounding Note  Patient Name: Gregory [name] Date of Encounter: 09/18/2019  Primary Cardiologist: Larae Grooms, MD Electrophysiologist: Dr. Curt Bears   Subjective   The patient is doing well today. 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  Inpatient Medications    Scheduled Meds:  aspirin  81 mg Oral Daily   atorvastatin  20 mg Oral q1800   Chlorhexidine Gluconate Cloth  6 each Topical Daily   insulin aspart  0-24 Units Subcutaneous TID AC & HS   sodium chloride flush  3 mL Intravenous Q12H   sodium chloride flush  3 mL Intravenous Q12H   Continuous Infusions:  sodium chloride 10 mL/hr at 09/17/19 0700   PRN Meds: sodium chloride, acetaminophen **OR** acetaminophen, acetaminophen, ketorolac, ondansetron (ZOFRAN) IV, oxyCODONE, sodium chloride flush, sodium chloride flush, traMADol   Vital Signs    Vitals:   09/18/19 0000 09/18/19 0105 09/18/19 0400 09/18/19 0800  BP: 109/67 (!) 105/41 (!) 128/39 (!) 110/46  Pulse: 62 (!) 59 60 (!) 59  Resp: 17 20 20 10   Temp: 98.2 F (36.8 C) 98 F (36.7 C) 98.2 F (36.8 C) 98 F (36.7 C)  TempSrc: Oral Oral Oral Oral  SpO2: 98% 98% 98% 99%  Weight:      Height:        Intake/Output Summary (Last 24 hours) at 09/18/2019 0953 Last data filed at 09/18/2019 0400 Gross per 24 hour  Intake 498.82 ml  Output --  Net 498.82 ml   Filed Weights   09/16/19 0739  Weight: 112 kg    Physical Exam    GEN- The patient is well appearing, alert and oriented x 3 today.   Head- normocephalic, atraumatic Eyes-  Sclera clear, conjunctiva pink Ears- hearing intact Oropharynx- clear Neck- supple Lungs- Clear to ausculation bilaterally, normal work of breathing Heart- Regular rate and rhythm, no murmurs, rubs or gallops GI- soft, NT, ND, + BS Extremities- no clubbing, cyanosis, or edema Skin- no rash or lesion Psych- euthymic mood, full affect Neuro- strength and sensation are  intact  Labs    CBC Recent Labs    09/17/19 0458 09/18/19 0241  WBC 11.1* 9.6  HGB 12.1* 11.5*  HCT 37.6* 35.2*  MCV 91.5 90.5  PLT 156 053*   Basic Metabolic Panel Recent Labs    09/17/19 0458 09/18/19 0241  NA 135 136  K 4.4 4.1  CL 104 105  CO2 21* 21*  GLUCOSE 122* 103*  BUN 10 13  CREATININE 0.98 1.02  CALCIUM 8.4* 8.2*  MG 1.6*  --    Liver Function Tests No results for input(s): AST, ALT, ALKPHOS, BILITOT, PROT, ALBUMIN in the last 72 hours. No results for input(s): LIPASE, AMYLASE in the last 72 hours. Cardiac Enzymes No results for input(s): CKTOTAL, CKMB, CKMBINDEX, TROPONINI in the last 72 hours.   Telemetry    AF with underlying V pacing in 60s (personally reviewed)  Radiology    DG Chest 2 View  Result Date: 09/18/2019 CLINICAL DATA:  Check pacemaker placement EXAM: CHEST - 2 VIEW COMPARISON:  09/16/2019 FINDINGS: Cardiac shadow is enlarged but stable. Aortic calcifications are again seen. New right-sided pacemaker is noted. No pneumothorax is seen. The lungs are clear. No bony abnormality is noted. Changes of prior TAVR are seen. IMPRESSION: No pneumothorax following pacemaker placement. Electronically Signed   By: Inez Catalina M.D.   On: 09/18/2019 08:50   EP PPM/ICD IMPLANT  Result Date: 09/17/2019 SURGEON:  Edwen Mclester  Meredith Leeds, MD   PREPROCEDURE DIAGNOSIS:  Complete heart block   POSTPROCEDURE DIAGNOSIS:  Complete heart block    PROCEDURES:  1. Left and right upper extremity venography.  2. Pacemaker implantation.   INTRODUCTION: Gregory [name] is a 79 y.o. male  with a history of bradycardia who presents today for pacemaker implantation.  The patient had a TAVR implanted and had complete heart block post procedure.  He thus presents today for pacemaker implant. DESCRIPTION OF PROCEDURE:  Informed written consent was obtained, and  the patient was brought to the electrophysiology lab in a fasting state.  The patient required no sedation for the  procedure today.  The patients left chest was prepped and draped in the usual sterile fashion by the EP lab staff. The skin overlying the left deltopectoral region was infiltrated with lidocaine for local analgesia.  A 4-cm incision was made over the left deltopectoral region.  A left subcutaneous pacemaker pocket was fashioned using a combination of sharp and blunt dissection. Electrocautery was required to assure hemostasis.  Access was attempted on the left axillary vein.  A wire was passed through the left axillary vein, but would not pass down the subclavian.  A venogram was thus performed which showed obstruction of the left subclavian vein.  The left-sided pacemaker pocket was irrigated with copious gentamicin solution.  The pocket was closed in 2 layers with 2-0 Vicryl suture in the subcutaneous and 3-0 Vicryl suture in the subcuticular layer.  Steri-Strips and a sterile dressing was applied. Attention was paid thus to the right side for pacemaker implant.  The patient's chest was prepped and draped in a sterile fashion.  Skin overlying the right deltopectoral region was infiltrated with lidocaine.  A right subcutaneous pacemaker pocket was fashioned with a combination of sharp and blunt dissection.  Electrocautery was used to ensure hemostasis. Right upper Extremity Venography: A venogram of the right upper extremity was performed, which revealed a large right cephalic vein, which emptied into a large left subclavian vein.  The right axillary vein was moderate in size.  RV Lead Placement: The left axillary vein was cannulated.  Through the left axillary vein, a Medtronic model 5076 (serial number [id number]) ventricular lead were advanced with fluoroscopic visualization into the right ventricular apex.  Initial right ventricular lead R-waves measured 11 paced mV with an impedance of 1129 ohms and a threshold of 0.9 V at 0.5 msec.  Both leads were secured to the pectoralis fascia using #2-0 silk over the  suture sleeves. Device Placement:  The leads were then connected to a Medtronic Azure XT DR MRI SureScan (serial number [id number] pacemaker.  The pocket was irrigated with copious gentamicin solution.  The pacemaker was then placed into the pocket.  The pocket was then closed in 3 layers with 2.0 Vicryl suture for the subcutaneous and 3.0 Vicryl suture subcuticular layers.  Steri-Strips and a sterile dressing were then applied. EBL<37ml. There were no early apparent complications.   CONCLUSIONS:  1. Successful implantation of a Medtronic Azure XT DR MRI SureScan dual-chamber pacemaker for symptomatic bradycardia  2. No early apparent complications.       Gregory [name] Meredith Leeds, MD 09/17/2019 5:41 PM   DG Chest Port 1 View  Result Date: 09/16/2019 CLINICAL DATA:  Postop TAVR today. EXAM: PORTABLE CHEST 1 VIEW COMPARISON:  Radiographs 09/12/2019 and 06/12/2015.  CT 08/21/2019. FINDINGS: 1238 hours. Right IJ temporary pacer extends to the right ventricular apex. There is stable cardiomegaly post interval  TAVR. There is vascular congestion with mild bibasilar atelectasis. No overt pulmonary edema, confluent airspace opacity, pneumothorax or significant pleural effusion. The bones appear unchanged. IMPRESSION: Interval TAVR with mild vascular congestion and bibasilar atelectasis. No evidence of pneumothorax. Electronically Signed   By: Richardean Sale M.D.   On: 09/16/2019 12:54   ECHOCARDIOGRAM COMPLETE  Result Date: 09/17/2019    ECHOCARDIOGRAM REPORT   Patient Name:   Gregory [name] Date of Exam: 09/17/2019 Medical Rec #:  [id number]     Height:       70.5 in Accession #:    [id number]    Weight:       247.0 lb Date of Birth:  1940/09/05    BSA:          2.295 m Patient Age:    34 years      BP:           118/41 mmHg Patient Gender: M             HR:           60 bpm. Exam Location:  Inpatient Procedure: Cardiac Doppler, Color Doppler and Limited Echo Indications:    TAVR evaluation  History:        Patient has  prior history of Echocardiogram examinations, most                 recent 09/16/2019. CHF, Mitral Valve Disease and Aortic Valve                 Disease, Arrythmias:Atrial Fibrillation; Risk Factors:Diabetes                 and Hypertension.                 Aortic Valve: 29 mm Edwards Sapien prosthetic, stented (TAVR)                 valve is present in the aortic position. Procedure Date:                 09/16/2019.  Sonographer:    Dustin Flock Referring Phys: [id number] Brewer  1. Left ventricular ejection fraction, by estimation, is 55 to 60%. The left ventricle has normal function. The left ventricle has no regional wall motion abnormalities. There is mild concentric left ventricular hypertrophy. Left ventricular diastolic function could not be evaluated.  2. Right ventricular systolic function is normal. The right ventricular size is normal. There is moderately elevated pulmonary artery systolic pressure. The estimated right ventricular systolic pressure is 44.8 mmHg.  3. Left atrial size was mild to moderately dilated.  4. The mitral valve is degenerative. Moderate mitral valve regurgitation. Moderate mitral stenosis. The mean mitral valve gradient is 7.0 mmHg with average heart rate of 60 bpm.  5. The aortic valve has been repaired/replaced. Aortic valve regurgitation is trivial. There is a 29 mm Edwards Sapien prosthetic (TAVR) valve present in the aortic position. Procedure Date: 09/16/2019. Trivial perivalvular leak at the posterior aspect of the aortic prosthesis.  6. The inferior vena cava is dilated in size with <50% respiratory variability, suggesting right atrial pressure of 15 mmHg. Comparison(s): No significant change from prior study. Prior images reviewed side by side. FINDINGS  Left Ventricle: Left ventricular ejection fraction, by estimation, is 55 to 60%. The left ventricle has normal function. The left ventricle has no regional wall motion abnormalities. The left  ventricular internal cavity size was normal in size. There is  mild concentric left ventricular hypertrophy. Left ventricular diastolic function could not be evaluated due to mitral stenosis. Left ventricular diastolic function could not be evaluated. Right Ventricle: The right ventricular size is normal. No increase in right ventricular wall thickness. Right ventricular systolic function is normal. There is moderately elevated pulmonary artery systolic pressure. The tricuspid regurgitant velocity is 3.14 m/s, and with an assumed right atrial pressure of 15 mmHg, the estimated right ventricular systolic pressure is 02.7 mmHg. Left Atrium: Left atrial size was mild to moderately dilated. Right Atrium: Right atrial size was normal in size. Pericardium: There is no evidence of pericardial effusion. Mitral Valve: The mitral valve is degenerative in appearance. There is severe thickening of the mitral valve leaflet(s). There is mild calcification of the mitral valve leaflet(s). Moderate mitral annular calcification. Moderate mitral valve regurgitation, with centrally-directed jet. Moderate mitral valve stenosis. MV peak gradient, 20.4 mmHg. The mean mitral valve gradient is 7.0 mmHg with average heart rate of 60 bpm. Tricuspid Valve: The tricuspid valve is normal in structure. Tricuspid valve regurgitation is trivial. Aortic Valve: The aortic valve has been repaired/replaced. Aortic valve regurgitation is trivial. Aortic valve mean gradient measures 7.0 mmHg. Aortic valve peak gradient measures 11.1 mmHg. Aortic valve area, by VTI measures 3.23 cm. There is a 29 mm Edwards Sapien prosthetic, stented (TAVR) valve present in the aortic position. Procedure Date: 09/16/2019. Pulmonic Valve: The pulmonic valve was not well visualized. Pulmonic valve regurgitation is not visualized. Aorta: The aortic root is normal in size and structure. Venous: The inferior vena cava is dilated in size with less than 50% respiratory  variability, suggesting right atrial pressure of 15 mmHg. IAS/Shunts: No atrial level shunt detected by color flow Doppler.  LEFT VENTRICLE PLAX 2D LVIDd:         5.40 cm LVIDs:         3.60 cm LV PW:         1.20 cm LV IVS:        1.10 cm LVOT diam:     2.50 cm LV SV:         128 LV SV Index:   56 LVOT Area:     4.91 cm  AORTIC VALVE AV Area (Vmax):    3.13 cm AV Area (Vmean):   3.05 cm AV Area (VTI):     3.23 cm AV Vmax:           166.50 cm/s AV Vmean:          124.000 cm/s AV VTI:            0.395 m AV Peak Grad:      11.1 mmHg AV Mean Grad:      7.0 mmHg LVOT Vmax:         106.00 cm/s LVOT Vmean:        77.100 cm/s LVOT VTI:          0.260 m LVOT/AV VTI ratio: 0.66 MITRAL VALVE              TRICUSPID VALVE MV Area (PHT): 1.83 cm   TR Peak grad:   39.4 mmHg MV Peak grad:  20.4 mmHg  TR Vmax:        314.00 cm/s MV Mean grad:  7.0 mmHg MV Vmax:       2.26 m/s   SHUNTS MV Vmean:      109.0 cm/s Systemic VTI:  0.26 m  Systemic Diam: 2.50 cm Sanda Klein MD Electronically signed by Sanda Klein MD Signature Date/Time: 09/17/2019/12:42:51 PM    Final    ECHOCARDIOGRAM LIMITED  Result Date: 09/16/2019    ECHOCARDIOGRAM LIMITED REPORT   Patient Name:   Gregory [name] Cail Date of Exam: 09/16/2019 Medical Rec #:  [id number]     Height:       70.5 in Accession #:    [id number]    Weight:       247.0 lb Date of Birth:  09/24/1940    BSA:          2.295 m Patient Age:    44 years      BP:           107/42 mmHg Patient Gender: M             HR:           62 bpm. Exam Location:  Inpatient Procedure: Limited Echo, Cardiac Doppler and Color Doppler Indications:     Aortic stenosis  History:         Patient has prior history of Echocardiogram examinations, most                  recent 07/15/2019. Mitral Stenosis and Aortic Valve Disease;                  Risk Factors:Diabetes and Hypertension.                  Aortic Valve: 29 mm Edwards Edwards Sapien prosthetic, stented                  (TAVR) valve  is present in the aortic position. Procedure Date:                  09/16/2019.  Sonographer:     Dustin Flock Referring Phys:  [id number] Woodfin Ganja THOMPSON Diagnosing Phys: Sanda Klein MD                   PREOPERATIVE FINDINGS:                   Normal left ventricular systolic function, Estimated LV                  ejection fraction 55-60%. Normal regional wall motion.                  Trileaflet aortic valve with severe calcific stenosis. Trivial                  aortic regurgitation.                  Peak aortic valve gradient 81 mm Hg, mean gradient 56 mm Hg,                  dimensionless obstructive index 0.13, estimated aortic valve                  area 0.5 cm.                  Severe degenerative mitral valve changes with mitral stenosis                  (not quantified) and mild-moderate mitral insufficiency.                  No pericardial effusion.  POST-PROCEDURE FINDINGS:                   Normal left ventricular systolic function, Estimated LV                  ejection fraction 55-60%. Normal regional wall motion.                  Well seated aortic stent-valve TAVR. There is mild perivalvular                  leak at the level of the native left-noncoronary commissure.                  Peak aortic valve gradient 4 mm Hg, mean gradient 2 mm Hg,                  dimensionless obstructive index 0.59, estimated aortic valve                  area 3.0 cm.                  Severe degenerative mitral valve changes with mitral stenosis                  (not quantified) and mild-moderate mitral insufficiency                  (unchanged).                  No pericardial effusion. IMPRESSIONS  1. Left ventricular ejection fraction, by estimation, is 55 to 60%. The left ventricle has normal function. The left ventricle has no regional wall motion abnormalities. There is mild concentric left ventricular hypertrophy. Left ventricular diastolic function could not be evaluated.  2. Right  ventricular systolic function is normal. The right ventricular size is normal.  3. Left atrial size was mild to moderately dilated.  4. Mild to moderate mitral valve regurgitation.  5. Aortic valve regurgitation is mild. There is a 29 mm Edwards Edwards Sapien prosthetic (TAVR) valve present in the aortic position. Procedure Date: 09/16/2019. Echo findings are consistent with perivalvular leak of the aortic prosthesis. FINDINGS  Left Ventricle: Left ventricular ejection fraction, by estimation, is 55 to 60%. The left ventricle has normal function. The left ventricle has no regional wall motion abnormalities. There is mild concentric left ventricular hypertrophy. Left ventricular diastolic function could not be evaluated due to atrial fibrillation. Right Ventricle: The right ventricular size is normal. No increase in right ventricular wall thickness. Right ventricular systolic function is normal. Left Atrium: Left atrial size was mild to moderately dilated. Right Atrium: Right atrial size was not well visualized. Pericardium: There is no evidence of pericardial effusion. Mitral Valve: There is severe thickening of the mitral valve leaflet(s). There is moderate calcification of the mitral valve leaflet(s). Moderately decreased mobility of the mitral valve leaflets. Moderate to severe mitral annular calcification. Mild to moderate mitral valve regurgitation, with centrally-directed jet. Tricuspid Valve: The tricuspid valve is not well visualized. Tricuspid valve regurgitation is not demonstrated. Aortic Valve: Aortic valve regurgitation is mild. Aortic valve mean gradient measures 29.0 mmHg. Aortic valve peak gradient measures 41.0 mmHg. Aortic valve area, by VTI measures 0.87 cm. There is a 29 mm Edwards Edwards Sapien prosthetic, stented (TAVR) valve present in the aortic position. Procedure Date: 09/16/2019. Pulmonic Valve: The pulmonic valve was grossly normal. Pulmonic valve regurgitation is trivial. Aorta: The  aortic root is normal in size  and structure. IAS/Shunts: The interatrial septum was not assessed.  LEFT VENTRICLE PLAX 2D LVOT diam:     2.20 cm LV SV:         63 LV SV Index:   27 LVOT Area:     3.80 cm  AORTIC VALVE AV Area (Vmax):    0.81 cm AV Area (Vmean):   0.87 cm AV Area (VTI):     0.87 cm AV Vmax:           320.00 cm/s AV Vmean:          215.200 cm/s AV VTI:            0.730 m AV Peak Grad:      41.0 mmHg AV Mean Grad:      29.0 mmHg LVOT Vmax:         68.50 cm/s LVOT Vmean:        49.350 cm/s LVOT VTI:          0.166 m LVOT/AV VTI ratio: 0.23  SHUNTS Systemic VTI:  0.17 m Systemic Diam: 2.20 cm Sanda Klein MD Electronically signed by Sanda Klein MD Signature Date/Time: 09/16/2019/12:41:53 PM    Final    Structural Heart Procedure  Result Date: 09/16/2019 See surgical note for result.   Patient Profile     Gregory [name] is a 79 y.o. male with a hx of permanent AFib, RBBB, chronic CHF (diastolic), DM, HTN,  non-Hodgkin's lymphoma, PUD, and VHD w/mod MR/MS and severe AS  who is being seen today for the evaluation of post-op CHB at the request of Dr. Burt Knack.  Assessment & Plan    1. Post TAVR CHB Now s/p MDT SINGLE chamber PPM.  Left sided occlusion -> Right sided device placed after left sided attempt. Pacing appropriately overnight. Usual follow up has been made. Can resume BB and dilt as needed.  Martez Weiand remove outer bandage before discharge (Today vs tomorrow)   2. AFib He is OK to resume coumadin from EP perspective with CHA2DS2VASC of at least 5. His INR is 1.3 today.      EP to see as needed while remains here. Ok to remove outer bandage of R PPM site if leaves today. If discharges tomorrow, I Naoki Migliaccio come back by and remove.   For questions or updates, please contact [place] Please consult [email or web address] for contact info under Cardiology/STEMI.  Signed, Shirley Friar, PA-C  09/18/2019, 9:53 AM   I have seen and examined this patient with Oda Kilts.   Agree with above, note added to reflect my findings.  On exam, RRR, no murmurs, lungs clear.  S/p Medtronic single chamber pacemaker for complete AV block. CXR and interrogation without issue. Kennethia Lynes arrange follow up in device clinic.   Chantella Creech M. Katherine Tout MD 09/18/2019 2:04 PM

## 2019-09-18 NOTE — Progress Notes (Signed)
CARDIAC REHAB PHASE I   PRE:  Rate/Rhythm: 60 paced  BP:  Sitting: 120/81      SaO2: 95 RA  MODE:  Ambulation: 300 ft   POST:  Rate/Rhythm: 73 paced  BP:  Sitting: 153/57    SaO2: 93 RA  Pt assisted to EOB, then ambulated 365ft in hallway assist of one with gait belt and front wheel walker. Pt took several short standing rest breaks due to distraction of talking. Pt returned to recliner. Chair alarm on. Encouraged continued ambulation. Will continue to follow.  [id number] Rufina Falco, RN BSN 09/18/2019 11:37 AM

## 2019-09-18 NOTE — Progress Notes (Signed)
Inpatient Diabetes Program Recommendations  AACE/ADA: New Consensus Statement on Inpatient Glycemic Control (2015)  Target Ranges:  Prepandial:   less than 140 mg/dL      Peak postprandial:   less than 180 mg/dL (1-2 hours)      Critically ill patients:  140 - 180 mg/dL   Lab Results  Component Value Date   GLUCAP 256 (H) 09/18/2019   HGBA1C 7.2 (H) 09/12/2019    Review of Glycemic Control Results for Gregory [name], Gregory [name] (MRN [id number]) as of 09/18/2019 14:44  Ref. Range 09/17/2019 14:08 09/17/2019 18:48 09/17/2019 21:16 09/18/2019 06:09 09/18/2019 11:49  Glucose-Capillary Latest Ref Range: 70 - 99 mg/dL 112 (H) 101 (H) 176 (H) 94 256 (H)   Diabetes history:  DM2  Outpatient Diabetes medications: Metformin 500 mg bid Actos 15 mg daily  Current orders for Inpatient glycemic control:  Novolog 0-24 units tid   Inpatient Diabetes Program Recommendations:    Please continue changing to carb modified diet  Will continue to follow while inpatient.  Thank you, Reche Dixon, RN, BSN Diabetes Coordinator Inpatient Diabetes Program [phone] (team pager from 8a-5p)

## 2019-09-18 NOTE — Progress Notes (Signed)
Initial Nutrition Assessment  DOCUMENTATION CODES:   Not applicable  INTERVENTION:   Downgrade diet to DYS 3   Add Ensure Enlive po BID, each supplement provides 350 kcal and 20 grams of protein  MVI daily   NUTRITION DIAGNOSIS:   Increased nutrient needs related to post-op healing as evidenced by estimated needs.  GOAL:   Patient will meet greater than or equal to 90% of their needs  MONITOR:   PO intake, Supplement acceptance, Weight trends, Labs, I & O's, Skin  REASON FOR ASSESSMENT:   Consult Assessment of nutrition requirement/status  ASSESSMENT:   Patient with PMH significant for CHF, DM, HTN, non-Hodgkin's lymphoma, severe aortic stenosis, and is s/p extraction of multiple teeth with alveoloplasty. Presents this admission for AVR.   6/15- s/p TAVR 6/16- s/p PPM  Unable to obtain history from pt as he was out of the room. Dicussed case with RN. Pt has bruising from dental extraction and has a hard time chewing regular texture meat. Consumed pancakes and eggs this morning but was unable to chew sausage. Last meal completion charted as 80%. Downgrade diet to DYS3 and provide Ensure to maximize kcal and protein.  Records show pt weighed 122.3 kg on 05/23/2018 and 112 kg this admission. Unable to determine dry wt loss vs fluid fluctuation given history of CHF.   Plan d/c tomorrow.   Medications: SS novolog Labs: CBG 94-256  Diet Order:   Diet Order            DIET DYS 3 Room service appropriate? Yes; Fluid consistency: Thin  Diet effective now                 EDUCATION NEEDS:   Not appropriate for education at this time  Skin:  Skin Assessment: Skin Integrity Issues: Skin Integrity Issues:: Incisions Incisions: bilateral legs, chest  Last BM:  PTA  Height:   Ht Readings from Last 1 Encounters:  09/16/19 5' 10.5" (1.791 m)    Weight:   Wt Readings from Last 1 Encounters:  09/16/19 112 kg    BMI:  Body mass index is 34.94  kg/m.  Estimated Nutritional Needs:   Kcal:  2200-2400 kcal  Protein:  110-125 grams  Fluid:  >/= 2.2 L/day  Mariana Single RD, LDN Clinical Nutrition Pager listed in [place]

## 2019-09-18 NOTE — Discharge Summary (Signed)
[place] VALVE TEAM  Discharge Summary    Patient ID: Gregory [name] MRN: [id number]; DOB: 11/22/1940  Admit date: 09/16/2019 Discharge date: 09/19/2019  Primary Care Provider: Kathyrn Lass, MD  Primary Cardiologist: Larae Grooms, MD / Dr. Burt Knack & Dr. Cyndia Bent (TAVR)  Discharge Diagnoses    Principal Problem:   S/P TAVR (transcatheter aortic valve replacement) Active Problems:   Non Hodgkin's lymphoma ([place])   Mixed hyperlipidemia   Obesity   Essential hypertension, benign   Severe aortic stenosis   Mitral valve disease   Hypertension   Chronic atrial fibrillation (HCC)   Diabetes mellitus type 2, noninsulin dependent (HCC)   Acute on chronic diastolic heart failure (HCC)   Allergies Allergies  Allergen Reactions  . Macrobid [Nitrofurantoin]   . Nystatin   . Prevnar 13 [Pneumococcal 13-Val Conj Vacc]   . Bisoprolol-Hydrochlorothiazide Other (See Comments)    "Severe" arthralgias and myalgias  . Nsaids Other (See Comments)    GI upset, Peptic ulcer disease    Diagnostic Studies/Procedures    TAVR OPERATIVE NOTE   Date of Procedure:                09/16/2019  Preoperative Diagnosis:      Severe Aortic Stenosis   Postoperative Diagnosis:    Same   Procedure:        Transcatheter Aortic Valve Replacement - Percutaneous Right Transfemoral Approach             Edwards Sapien 3 THV (size 29 mm, model # 9600TFX, serial # [id number])              Co-Surgeons:                        Gaye Pollack, MD and Sherren Mocha, MD     Anesthesiologist:                  Annye Asa, MD  Echocardiographer:              Bertrum Sol, MD  Pre-operative Echo Findings: ? Severe aortic stenosis ? Normal left ventricular systolic function  Post-operative Echo Findings: ? Mild paravalvular leak ? Normal left ventricular systolic function  _____________   Echo 09/16/19 IMPRESSIONS  1. Left ventricular  ejection fraction, by estimation, is 55 to 60%. The  left ventricle has normal function. The left ventricle has no regional  wall motion abnormalities. There is mild concentric left ventricular  hypertrophy. Left ventricular diastolic function could not be evaluated.  2. Right ventricular systolic function is normal. The right ventricular  size is normal. There is moderately elevated pulmonary artery systolic  pressure. The estimated right ventricular systolic pressure is 35.5 mmHg.  3. Left atrial size was mild to moderately dilated.  4. The mitral valve is degenerative. Moderate mitral valve regurgitation.  Moderate mitral stenosis. The mean mitral valve gradient is 7.0 mmHg with average heart rate of 60 bpm.  5. The aortic valve has been repaired/replaced. Aortic valve  regurgitation is trivial. There is a 29 mm Edwards Sapien prosthetic  (TAVR) valve present in the aortic position. Procedure Date: 09/16/2019.  Trivial perivalvular leak at the posterior aspect  of the aortic prosthesis.  6. The inferior vena cava is dilated in size with <50% respiratory  variability, suggesting right atrial pressure of 15 mmHg.  Comparison(s): No significant change from prior study. Prior images  reviewed side by side.   ________________  09/17/19 PACEMAKER IMPLANT  Conclusion SURGEON:  Will Meredith Leeds, MD     PREPROCEDURE DIAGNOSIS:  Complete heart block    POSTPROCEDURE DIAGNOSIS:  Complete heart block     PROCEDURES:   1. Left and right upper extremity venography.   2. Pacemaker implantation.    History of Present Illness     Gregory [name] is a 79 y.o. male with a history of persistant atrial fibrillation on coumadin, RBBB,chronic diastolic CHF, mitral regurgitation, Non Hodgkins lymphoma, DM, HTN, severe MAC with mod MS/MR and severe aortic stenosis who [facility] to [facility]19 for planned TAVR.   Most recent echo April 2021 with LVEF=55%, mild MR, mild to moderate mitral  stenosis and severe aortic stenosis with mean gradient 44 mmHg, peak gradient 74 mmHg, AVA 0.69 cm2, dimensionless index 0.17. Cardiac cath May 2021 with no significant CAD. He underwent dental extractions on 09/04/19.  The patient has been evaluated by the multidisciplinary valve team and felt to have severe, symptomatic aortic stenosis and to be a suitable candidate for TAVR, which was set up for 09/16/19.   Hospital Course     Consultants: EP   Severe AS: s/p successful TAVR with a 29 mm Edwards Sapien 3 THV via the TF approach on 09/16/19. Post operative echo showed EF 55-60%, normally functioning TAVR with a mean gradient of 11.1 mm Hg and trivial perivalvular leak at the posterior aspect of the aortic prosthesis  Groin sites are stable. He developed CHB after TAVR requiring PPM. He was resumed on coumadin 6/17 with the addition of a baby aspirin which will be continued x 72month.  CHB: pt had underlying afib with RBBB. Post TAVR he developed CHB that never resolved. His temp wire was left in place and he underwent PPM with a Medtronic Azure XT DR MRI SureScan single-chamber pacemaker on 09/17/19 by Dr. CCurt Bears Of note, the pt had left sided subclavian occlusion and required PM implantation on the right side.   Persistent afib: underlying afib, now Vpaced. Started back on coumadin 6/17. He has a coumadin clinic apt on 10/24 @ PCP office.   Acute on chronic diastolic CHF: as evidenced by an elevated BNP on pre admission lab work and mild CHF on CXR. This has been treated with TAVR.   S/p dental extractions: pt was noted to have poorly healing wounds at dental extraction site. Nutrition consult obtained to provide education on appropriate foods to eat  _____________  Discharge Vitals Blood pressure (!) 122/50, pulse 66, temperature 98.5 F (36.9 C), temperature source Oral, resp. rate 20, height 5' 10.5" (1.791 m), weight 115.7 kg, SpO2 94 %.  Filed Weights   09/16/19 0739 09/19/19 0545    Weight: 112 kg 115.7 kg    General appearance: alert and cooperative Neurologic: intact Heart: regular rate and rhythm, S1, S2 normal, no murmur, click, rub or gallop Lungs: clear to auscultation bilaterally Extremities: extremities normal, atraumatic, no cyanosis or edema Wound: pacer site ok. small amount of bloody drainage on left chest dressing but no hematoma. groin sites ok.   Labs & Radiologic Studies    CBC Recent Labs    09/17/19 0458 09/18/19 0241  WBC 11.1* 9.6  HGB 12.1* 11.5*  HCT 37.6* 35.2*  MCV 91.5 90.5  PLT 156 1127   Basic Metabolic Panel Recent Labs    09/17/19 0458 09/18/19 0241  NA 135 136  K 4.4 4.1  CL 104 105  CO2 21* 21*  GLUCOSE 122* 103*  BUN  10 13  CREATININE 0.98 1.02  CALCIUM 8.4* 8.2*  MG 1.6*  --    Liver Function Tests No results for input(s): AST, ALT, ALKPHOS, BILITOT, PROT, ALBUMIN in the last 72 hours. No results for input(s): LIPASE, AMYLASE in the last 72 hours. Cardiac Enzymes No results for input(s): CKTOTAL, CKMB, CKMBINDEX, TROPONINI in the last 72 hours. BNP Invalid input(s): POCBNP D-Dimer No results for input(s): DDIMER in the last 72 hours. Hemoglobin A1C No results for input(s): HGBA1C in the last 72 hours. Fasting Lipid Panel No results for input(s): CHOL, HDL, LDLCALC, TRIG, CHOLHDL, LDLDIRECT in the last 72 hours. Thyroid Function Tests No results for input(s): TSH, T4TOTAL, T3FREE, THYROIDAB in the last 72 hours.  Invalid input(s): FREET3 _____________  DG Chest 2 View  Result Date: 09/18/2019 CLINICAL DATA:  Check pacemaker placement EXAM: CHEST - 2 VIEW COMPARISON:  09/16/2019 FINDINGS: Cardiac shadow is enlarged but stable. Aortic calcifications are again seen. New right-sided pacemaker is noted. No pneumothorax is seen. The lungs are clear. No bony abnormality is noted. Changes of prior TAVR are seen. IMPRESSION: No pneumothorax following pacemaker placement. Electronically Signed   By: Inez Catalina  M.D.   On: 09/18/2019 08:50   DG Chest 2 View  Result Date: 09/12/2019 CLINICAL DATA:  Preoperative evaluation for TAVR, history type II at diabetes mellitus, atrial fibrillation, CHF, hypertension, lymphoma EXAM: CHEST - 2 VIEW COMPARISON:  06/12/2015 FINDINGS: Enlargement of cardiac silhouette. Mediastinal contours and pulmonary vascularity normal. Atherosclerotic calcification aorta. Lungs clear. No pulmonary infiltrate, pleural effusion or pneumothorax. Slight interstitial prominence seen on previous exam resolved. Minimal endplate spur formation thoracic spine. IMPRESSION: Enlargement of cardiac silhouette. No acute abnormalities. Aortic Atherosclerosis (ICD10-I70.0). Electronically Signed   By: Lavonia Dana M.D.   On: 09/12/2019 10:10   EP PPM/ICD IMPLANT  Result Date: 09/17/2019 SURGEON:  Will Meredith Leeds, MD   PREPROCEDURE DIAGNOSIS:  Complete heart block   POSTPROCEDURE DIAGNOSIS:  Complete heart block    PROCEDURES:  1. Left and right upper extremity venography.  2. Pacemaker implantation.   INTRODUCTION: KENNIS WISSMANN is a 79 y.o. male  with a history of bradycardia who presents today for pacemaker implantation.  The patient had a TAVR implanted and had complete heart block post procedure.  He thus presents today for pacemaker implant. DESCRIPTION OF PROCEDURE:  Informed written consent was obtained, and  the patient was brought to the electrophysiology lab in a fasting state.  The patient required no sedation for the procedure today.  The patients left chest was prepped and draped in the usual sterile fashion by the EP lab staff. The skin overlying the left deltopectoral region was infiltrated with lidocaine for local analgesia.  A 4-cm incision was made over the left deltopectoral region.  A left subcutaneous pacemaker pocket was fashioned using a combination of sharp and blunt dissection. Electrocautery was required to assure hemostasis.  Access was attempted on the left axillary vein.  A  wire was passed through the left axillary vein, but would not pass down the subclavian.  A venogram was thus performed which showed obstruction of the left subclavian vein.  The left-sided pacemaker pocket was irrigated with copious gentamicin solution.  The pocket was closed in 2 layers with 2-0 Vicryl suture in the subcutaneous and 3-0 Vicryl suture in the subcuticular layer.  Steri-Strips and a sterile dressing was applied. Attention was paid thus to the right side for pacemaker implant.  The patient's chest was prepped and draped in  a sterile fashion.  Skin overlying the right deltopectoral region was infiltrated with lidocaine.  A right subcutaneous pacemaker pocket was fashioned with a combination of sharp and blunt dissection.  Electrocautery was used to ensure hemostasis. Right upper Extremity Venography: A venogram of the right upper extremity was performed, which revealed a large right cephalic vein, which emptied into a large left subclavian vein.  The right axillary vein was moderate in size.  RV Lead Placement: The left axillary vein was cannulated.  Through the left axillary vein, a Medtronic model 5076 (serial number [id number]) ventricular lead were advanced with fluoroscopic visualization into the right ventricular apex.  Initial right ventricular lead R-waves measured 11 paced mV with an impedance of 1129 ohms and a threshold of 0.9 V at 0.5 msec.  Both leads were secured to the pectoralis fascia using #2-0 silk over the suture sleeves. Device Placement:  The leads were then connected to a Medtronic Azure XT DR MRI SureScan (serial number [id number] pacemaker.  The pocket was irrigated with copious gentamicin solution.  The pacemaker was then placed into the pocket.  The pocket was then closed in 3 layers with 2.0 Vicryl suture for the subcutaneous and 3.0 Vicryl suture subcuticular layers.  Steri-Strips and a sterile dressing were then applied. EBL<48m. There were no early apparent  complications.   CONCLUSIONS:  1. Successful implantation of a Medtronic Azure XT DR MRI SureScan dual-chamber pacemaker for symptomatic bradycardia  2. No early apparent complications.       Will MMeredith Leeds MD 09/17/2019 5:41 PM   CT CORONARY MORPH W/CTA COR W/SCORE W/CA W/CM &/OR WO/CM  Addendum Date: 08/23/2019   ADDENDUM REPORT: 08/23/2019 15:30 ADDENDUM: 89tear old male with severe aortic stenosis being evaluated for a TAVR procedure. EXAM: Cardiac TAVR CT TECHNIQUE: The patient was scanned on a [employer] A 120 kV retrospective scan was triggered in the descending thoracic aorta at 111 HU's. Gantry rotation speed was 250 msecs and collimation was .6 mm. No beta blockade or nitro were given. The 3D data set was reconstructed in 5% intervals of the R-R cycle. Systolic and diastolic phases were analyzed on a dedicated work station using MPR, MIP and VRT modes. The patient received 80 cc of contrast. FINDINGS: Aortic Root: Aortic valve: Trileaflet Aortic valve calcium score: 6709 Aortic annulus: Diameter: 321mx 2376merimeter: 11m46mea: 550 mm^2 Calcifications: Moderate calcification adjacent to left coronary cusp. Mild calcification adjacent to noncoronary cusp. Coronary height: Min Left - 18mm33mx Left - 24mm;56m Right - 18mm S57mubular height: Left cusp -26mm ; 77mt cusp - 26mm; No80monary cusp - 24mm LVOT49m measured 3 mm below the annulus): Diameter: 31mm x 51m81mea: 81mmm^2 Calcifications: Moderate calcification beneath left coronary cusp Aortic sinus width: Left cusp - 37mm; Right 40m - 35mm; Noncoro4m cusp - 35mm Sinotubul50munction width: 32mm x 32mm Opt42m Flu60mcopic Angle for Delivery: LAO 7 CRA 4 Cardiac: Right atrium: Moderate dilatation Right ventricle: Moderate dilatation Pulmonary arteries: Dilated, main PA measures 31mm Pulmonary ve57m Normal configuration Left atrium: Severe dilatation Left ventricle: Normal size Pericardium: Normal thickness Coronary  arteries: Calcium score 2772 (90th percentile) IMPRESSION: 1. Trileaflet aortic valve with severe calcifications (calcium score 6709) 2. Aortic annulus measures 30mm x 23mm with p65meter49mm and area 550 mm100mThere is moderate annular calcification adjacent to left coronary cusp, which extends into LVOT. Annular measurements suitable for delivery of 29mm Edwards-Sapient 44mlve 2. Sufficient coronary to annulus distance, measuring  33m to left main and 13mto RCA 3.  Optimum Fluoroscopic Angle for Delivery: LAO 7 CRA 4 4.  Coronary calcium score 2772 (90th percentile) Electronically Signed   By: ChOswaldo MilianD   On: 08/23/2019 15:30   Result Date: 08/23/2019 EXAM: OVER-READ INTERPRETATION  CT CHEST The following report is an over-read performed by radiologist Dr. [facility]Salvatore Marvelf [facility] PACrescent Valleyn 08/21/2019. This over-read does not include interpretation of cardiac or coronary anatomy or pathology. The coronary CT interpretation by the cardiologist is attached. COMPARISON:  04/17/2003 chest CT. FINDINGS: Please see the separate concurrent chest CT angiogram report for details. IMPRESSION: Please see the separate concurrent chest CT angiogram report for details. Electronically Signed: By: JaIlona Sorrel.D. On: 08/21/2019 11:33   DG Chest Port 1 View  Result Date: 09/16/2019 CLINICAL DATA:  Postop TAVR today. EXAM: PORTABLE CHEST 1 VIEW COMPARISON:  Radiographs 09/12/2019 and 06/12/2015.  CT 08/21/2019. FINDINGS: 1238 hours. Right IJ temporary pacer extends to the right ventricular apex. There is stable cardiomegaly post interval TAVR. There is vascular congestion with mild bibasilar atelectasis. No overt pulmonary edema, confluent airspace opacity, pneumothorax or significant pleural effusion. The bones appear unchanged. IMPRESSION: Interval TAVR with mild vascular congestion and bibasilar atelectasis. No evidence of pneumothorax. Electronically Signed   By: WiRichardean Sale.D.   On:  09/16/2019 12:54   CT ANGIO CHEST AORTA W/CM & OR WO/CM  Result Date: 08/21/2019 CLINICAL DATA:  Severe symptomatic aortic stenosis. Pre-TAVR evaluation. EXAM: CT ANGIOGRAPHY CHEST, ABDOMEN AND PELVIS TECHNIQUE: Multidetector CT imaging through the chest, abdomen and pelvis was performed using the standard protocol during bolus administration of intravenous contrast. Multiplanar reconstructed images and MIPs were obtained and reviewed to evaluate the vascular anatomy. CONTRAST:  10032mMNIPAQUE IOHEXOL 350 MG/ML SOLN COMPARISON:  04/17/2003 chest CT. 08/20/2018 CT abdomen/pelvis. FINDINGS: CTA CHEST FINDINGS Cardiovascular: Mild cardiomegaly. Diffuse thickening and coarse calcification of the aortic valve. No significant pericardial effusion/thickening. Three-vessel coronary atherosclerosis. Atherosclerotic nonaneurysmal thoracic aorta. Top-normal caliber main pulmonary artery (3.4 cm diameter). No central pulmonary emboli. Mediastinum/Nodes: No discrete thyroid nodules. Unremarkable esophagus. No pathologically enlarged axillary, mediastinal or hilar lymph nodes. Lungs/Pleura: No pneumothorax. No pleural effusion. No acute consolidative airspace disease, lung masses or significant pulmonary nodules. Musculoskeletal: No aggressive appearing focal osseous lesions. Marked thoracic spondylosis. CTA ABDOMEN AND PELVIS FINDINGS Hepatobiliary: Normal liver with no liver mass. Normal gallbladder with no radiopaque cholelithiasis. No biliary ductal dilatation. Pancreas: Low-attenuation 1.3 cm focus in the pancreatic body (series 15/image 116). No additional potential pancreatic lesions. No pancreatic duct dilation. Spleen: Normal size. No mass. Adrenals/Urinary Tract: Right adrenal 1.1 cm nodule with density 49 HU, stable since 04/17/2003 CT, compatible with a benign adenoma. No left adrenal nodules. No hydronephrosis. No contour deforming renal masses. Normal bladder. Stomach/Bowel: Normal non-distended stomach. Normal  caliber small bowel with no small bowel wall thickening. Appendectomy. Mild sigmoid diverticulosis, with no large bowel wall thickening or significant pericolonic fat stranding. Vascular/Lymphatic: Atherosclerotic nonaneurysmal abdominal aorta. No pathologically enlarged lymph nodes in the abdomen or pelvis. Reproductive: Mild prostatomegaly. Other: No pneumoperitoneum, ascites or focal fluid collection. Musculoskeletal: No aggressive appearing focal osseous lesions. Marked lumbar spondylosis. VASCULAR MEASUREMENTS PERTINENT TO TAVR: AORTA: Minimal Aortic Diameter-14.1 x 13.2 mm Severity of Aortic Calcification-moderate RIGHT PELVIS: Right Common Iliac Artery - Minimal Diameter-10.9 x 10.7 mm Tortuosity-mild Calcification-mild Right External Iliac Artery - Minimal Diameter-8.4 x 8.3 mm Tortuosity-moderate Calcification-mild Right Common Femoral Artery - Minimal Diameter-8.4 x 7.8 mm Tortuosity-mild Calcification-mild  LEFT PELVIS: Left Common Iliac Artery - Minimal Diameter-11.4 x 9.6 mm Tortuosity-mild to moderate Calcification-mild Left External Iliac Artery - Minimal Diameter-9.7 x 8.4 mm Tortuosity-mild Calcification-mild Left Common Femoral Artery - Minimal Diameter-9.8 x 9.5 mm Tortuosity-mild Calcification-mild Review of the MIP images confirms the above findings. IMPRESSION: 1. Vascular findings and measurements pertinent to potential TAVR procedure, as detailed. 2. Diffuse thickening and coarse calcification of the aortic valve, compatible with the reported history of severe symptomatic aortic stenosis. 3. Low-attenuation 1.3 cm focus in the pancreatic body. No biliary or pancreatic duct dilation. MRI abdomen without and with IV contrast recommended for further characterization. 4. Mild cardiomegaly. 5. Stable right adrenal adenoma. 6. Mild sigmoid diverticulosis. 7. Mild prostatomegaly. 8. Aortic Atherosclerosis (ICD10-I70.0). Electronically Signed   By: Ilona Sorrel M.D.   On: 08/21/2019 12:33    ECHOCARDIOGRAM COMPLETE  Result Date: 09/17/2019    ECHOCARDIOGRAM REPORT   Patient Name:   Gregory [name] Date of Exam: 09/17/2019 Medical Rec #:  [id number]     Height:       70.5 in Accession #:    [id number]    Weight:       247.0 lb Date of Birth:  [birth year]    BSA:          2.295 m Patient Age:    79 years      BP:           118/41 mmHg Patient Gender: M             HR:           60 bpm. Exam Location:  Inpatient Procedure: Cardiac Doppler, Color Doppler and Limited Echo Indications:    TAVR evaluation  History:        Patient has prior history of Echocardiogram examinations, most                 recent 09/16/2019. CHF, Mitral Valve Disease and Aortic Valve                 Disease, Arrythmias:Atrial Fibrillation; Risk Factors:Diabetes                 and Hypertension.                 Aortic Valve: 29 mm Edwards Sapien prosthetic, stented (TAVR)                 valve is present in the aortic position. Procedure Date:                 09/16/2019.  Sonographer:    Dustin Flock Referring Phys: [id number] [place]  1. Left ventricular ejection fraction, by estimation, is 55 to 60%. The left ventricle has normal function. The left ventricle has no regional wall motion abnormalities. There is mild concentric left ventricular hypertrophy. Left ventricular diastolic function could not be evaluated.  2. Right ventricular systolic function is normal. The right ventricular size is normal. There is moderately elevated pulmonary artery systolic pressure. The estimated right ventricular systolic pressure is 53.2 mmHg.  3. Left atrial size was mild to moderately dilated.  4. The mitral valve is degenerative. Moderate mitral valve regurgitation. Moderate mitral stenosis. The mean mitral valve gradient is 7.0 mmHg with average heart rate of 60 bpm.  5. The aortic valve has been repaired/replaced. Aortic valve regurgitation is trivial. There is a 29 mm Edwards Sapien prosthetic (TAVR) valve present in  the aortic position. Procedure Date:  09/16/2019. Trivial perivalvular leak at the posterior aspect of the aortic prosthesis.  6. The inferior vena cava is dilated in size with <50% respiratory variability, suggesting right atrial pressure of 15 mmHg. Comparison(s): No significant change from prior study. Prior images reviewed side by side. FINDINGS  Left Ventricle: Left ventricular ejection fraction, by estimation, is 55 to 60%. The left ventricle has normal function. The left ventricle has no regional wall motion abnormalities. The left ventricular internal cavity size was normal in size. There is  mild concentric left ventricular hypertrophy. Left ventricular diastolic function could not be evaluated due to mitral stenosis. Left ventricular diastolic function could not be evaluated. Right Ventricle: The right ventricular size is normal. No increase in right ventricular wall thickness. Right ventricular systolic function is normal. There is moderately elevated pulmonary artery systolic pressure. The tricuspid regurgitant velocity is 3.14 m/s, and with an assumed right atrial pressure of 15 mmHg, the estimated right ventricular systolic pressure is 27.7 mmHg. Left Atrium: Left atrial size was mild to moderately dilated. Right Atrium: Right atrial size was normal in size. Pericardium: There is no evidence of pericardial effusion. Mitral Valve: The mitral valve is degenerative in appearance. There is severe thickening of the mitral valve leaflet(s). There is mild calcification of the mitral valve leaflet(s). Moderate mitral annular calcification. Moderate mitral valve regurgitation, with centrally-directed jet. Moderate mitral valve stenosis. MV peak gradient, 20.4 mmHg. The mean mitral valve gradient is 7.0 mmHg with average heart rate of 60 bpm. Tricuspid Valve: The tricuspid valve is normal in structure. Tricuspid valve regurgitation is trivial. Aortic Valve: The aortic valve has been repaired/replaced. Aortic  valve regurgitation is trivial. Aortic valve mean gradient measures 7.0 mmHg. Aortic valve peak gradient measures 11.1 mmHg. Aortic valve area, by VTI measures 3.23 cm. There is a 29 mm Edwards Sapien prosthetic, stented (TAVR) valve present in the aortic position. Procedure Date: 09/16/2019. Pulmonic Valve: The pulmonic valve was not well visualized. Pulmonic valve regurgitation is not visualized. Aorta: The aortic root is normal in size and structure. Venous: The inferior vena cava is dilated in size with less than 50% respiratory variability, suggesting right atrial pressure of 15 mmHg. IAS/Shunts: No atrial level shunt detected by color flow Doppler.  LEFT VENTRICLE PLAX 2D LVIDd:         5.40 cm LVIDs:         3.60 cm LV PW:         1.20 cm LV IVS:        1.10 cm LVOT diam:     2.50 cm LV SV:         128 LV SV Index:   56 LVOT Area:     4.91 cm  AORTIC VALVE AV Area (Vmax):    3.13 cm AV Area (Vmean):   3.05 cm AV Area (VTI):     3.23 cm AV Vmax:           166.50 cm/s AV Vmean:          124.000 cm/s AV VTI:            0.395 m AV Peak Grad:      11.1 mmHg AV Mean Grad:      7.0 mmHg LVOT Vmax:         106.00 cm/s LVOT Vmean:        77.100 cm/s LVOT VTI:          0.260 m LVOT/AV VTI ratio: 0.66 MITRAL VALVE  TRICUSPID VALVE MV Area (PHT): 1.83 cm   TR Peak grad:   39.4 mmHg MV Peak grad:  20.4 mmHg  TR Vmax:        314.00 cm/s MV Mean grad:  7.0 mmHg MV Vmax:       2.26 m/s   SHUNTS MV Vmean:      109.0 cm/s Systemic VTI:  0.26 m                           Systemic Diam: 2.50 cm Sanda Klein MD Electronically signed by Sanda Klein MD Signature Date/Time: 09/17/2019/12:42:51 PM    Final    VAS US CAROTID  Result Date: 08/21/2019 Carotid Arterial Duplex Study Indications:       Pre-TAVR. Risk Factors:      Hypertension. Limitations        Today's exam was limited due to the body habitus of the                    patient and the patient's respiratory variation. Comparison Study:  No prior  studies. Performing Technologist: Oliver Hum RVT  Examination Guidelines: A complete evaluation includes B-mode imaging, spectral Doppler, color Doppler, and power Doppler as needed of all accessible portions of each vessel. Bilateral testing is considered an integral part of a complete examination. Limited examinations for reoccurring indications may be performed as noted.  Right Carotid Findings: +----------+--------+--------+--------+------------------+--------+           PSV cm/sEDV cm/sStenosisPlaque DescriptionComments +----------+--------+--------+--------+------------------+--------+ CCA Prox  52      9                                          +----------+--------+--------+--------+------------------+--------+ CCA Distal42      7               calcific                   +----------+--------+--------+--------+------------------+--------+ ICA Prox  52      14              calcific                   +----------+--------+--------+--------+------------------+--------+ ICA Distal50      11                                tortuous +----------+--------+--------+--------+------------------+--------+ ECA       55      4                                          +----------+--------+--------+--------+------------------+--------+ +----------+--------+-------+--------+-------------------+           PSV cm/sEDV cmsDescribeArm Pressure (mmHG) +----------+--------+-------+--------+-------------------+ [id number]                                         +----------+--------+-------+--------+-------------------+ +---------+--------+--+--------+-+---------+ VertebralPSV cm/s44EDV cm/s8Antegrade +---------+--------+--+--------+-+---------+  Left Carotid Findings: +----------+--------+--------+--------+-----------------------+--------+           PSV cm/sEDV cm/sStenosisPlaque Description     Comments  +----------+--------+--------+--------+-----------------------+--------+ CCA Prox  79      13  smooth and heterogenoustortuous +----------+--------+--------+--------+-----------------------+--------+ CCA Distal58      11              calcific                        +----------+--------+--------+--------+-----------------------+--------+ ICA Prox  56      12              calcific                        +----------+--------+--------+--------+-----------------------+--------+ ICA Distal75      21                                     tortuous +----------+--------+--------+--------+-----------------------+--------+ ECA       55      6                                               +----------+--------+--------+--------+-----------------------+--------+ +----------+--------+--------+--------+-------------------+           PSV cm/sEDV cm/sDescribeArm Pressure (mmHG) +----------+--------+--------+--------+-------------------+ [id number]                                          +----------+--------+--------+--------+-------------------+ +---------+--------+--+--------+-+---------+ VertebralPSV cm/s38EDV cm/s9Antegrade +---------+--------+--+--------+-+---------+   Summary: Right Carotid: Velocities in the right ICA are consistent with a 1-39% stenosis. Left Carotid: Velocities in the left ICA are consistent with a 1-39% stenosis. Vertebrals: Bilateral vertebral arteries demonstrate antegrade flow. *See table(s) above for measurements and observations.  Electronically signed by Monica Martinez MD on 08/21/2019 at 5:27:52 PM.    Final    ECHOCARDIOGRAM LIMITED  Result Date: 09/16/2019    ECHOCARDIOGRAM LIMITED REPORT   Patient Name:   Gregory [name] Date of Exam: 09/16/2019 Medical Rec #:  [id number]     Height:       70.5 in Accession #:    [id number]    Weight:       247.0 lb Date of Birth:  1940/07/24    BSA:          2.295 m Patient Age:    3 years       BP:           107/42 mmHg Patient Gender: M             HR:           62 bpm. Exam Location:  Inpatient Procedure: Limited Echo, Cardiac Doppler and Color Doppler Indications:     Aortic stenosis  History:         Patient has prior history of Echocardiogram examinations, most                  recent 07/15/2019. Mitral Stenosis and Aortic Valve Disease;                  Risk Factors:Diabetes and Hypertension.                  Aortic Valve: 29 mm Edwards Edwards Sapien prosthetic, stented                  (TAVR) valve is present in the aortic  position. Procedure Date:                  09/16/2019.  Sonographer:     Dustin Flock Referring Phys:  [id number] Woodfin Ganja Randon Somera Diagnosing Phys: Sanda Klein MD                   PREOPERATIVE FINDINGS:                   Normal left ventricular systolic function, Estimated LV                  ejection fraction 55-60%. Normal regional wall motion.                  Trileaflet aortic valve with severe calcific stenosis. Trivial                  aortic regurgitation.                  Peak aortic valve gradient 81 mm Hg, mean gradient 56 mm Hg,                  dimensionless obstructive index 0.13, estimated aortic valve                  area 0.5 cm.                  Severe degenerative mitral valve changes with mitral stenosis                  (not quantified) and mild-moderate mitral insufficiency.                  No pericardial effusion.                   POST-PROCEDURE FINDINGS:                   Normal left ventricular systolic function, Estimated LV                  ejection fraction 55-60%. Normal regional wall motion.                  Well seated aortic stent-valve TAVR. There is mild perivalvular                  leak at the level of the native left-noncoronary commissure.                  Peak aortic valve gradient 4 mm Hg, mean gradient 2 mm Hg,                  dimensionless obstructive index 0.59, estimated aortic valve                  area 3.0 cm.                   Severe degenerative mitral valve changes with mitral stenosis                  (not quantified) and mild-moderate mitral insufficiency                  (unchanged).                  No pericardial effusion. IMPRESSIONS  1. Left ventricular ejection fraction, by estimation, is 55 to 60%. The left ventricle has normal function. The left ventricle has no  regional wall motion abnormalities. There is mild concentric left ventricular hypertrophy. Left ventricular diastolic function could not be evaluated.  2. Right ventricular systolic function is normal. The right ventricular size is normal.  3. Left atrial size was mild to moderately dilated.  4. Mild to moderate mitral valve regurgitation.  5. Aortic valve regurgitation is mild. There is a 29 mm Edwards Edwards Sapien prosthetic (TAVR) valve present in the aortic position. Procedure Date: 09/16/2019. Echo findings are consistent with perivalvular leak of the aortic prosthesis. FINDINGS  Left Ventricle: Left ventricular ejection fraction, by estimation, is 55 to 60%. The left ventricle has normal function. The left ventricle has no regional wall motion abnormalities. There is mild concentric left ventricular hypertrophy. Left ventricular diastolic function could not be evaluated due to atrial fibrillation. Right Ventricle: The right ventricular size is normal. No increase in right ventricular wall thickness. Right ventricular systolic function is normal. Left Atrium: Left atrial size was mild to moderately dilated. Right Atrium: Right atrial size was not well visualized. Pericardium: There is no evidence of pericardial effusion. Mitral Valve: There is severe thickening of the mitral valve leaflet(s). There is moderate calcification of the mitral valve leaflet(s). Moderately decreased mobility of the mitral valve leaflets. Moderate to severe mitral annular calcification. Mild to moderate mitral valve regurgitation, with centrally-directed jet. Tricuspid Valve: The  tricuspid valve is not well visualized. Tricuspid valve regurgitation is not demonstrated. Aortic Valve: Aortic valve regurgitation is mild. Aortic valve mean gradient measures 29.0 mmHg. Aortic valve peak gradient measures 41.0 mmHg. Aortic valve area, by VTI measures 0.87 cm. There is a 29 mm Edwards Edwards Sapien prosthetic, stented (TAVR) valve present in the aortic position. Procedure Date: 09/16/2019. Pulmonic Valve: The pulmonic valve was grossly normal. Pulmonic valve regurgitation is trivial. Aorta: The aortic root is normal in size and structure. IAS/Shunts: The interatrial septum was not assessed.  LEFT VENTRICLE PLAX 2D LVOT diam:     2.20 cm LV SV:         63 LV SV Index:   27 LVOT Area:     3.80 cm  AORTIC VALVE AV Area (Vmax):    0.81 cm AV Area (Vmean):   0.87 cm AV Area (VTI):     0.87 cm AV Vmax:           320.00 cm/s AV Vmean:          215.200 cm/s AV VTI:            0.730 m AV Peak Grad:      41.0 mmHg AV Mean Grad:      29.0 mmHg LVOT Vmax:         68.50 cm/s LVOT Vmean:        49.350 cm/s LVOT VTI:          0.166 m LVOT/AV VTI ratio: 0.23  SHUNTS Systemic VTI:  0.17 m Systemic Diam: 2.20 cm Sanda Klein MD Electronically signed by Sanda Klein MD Signature Date/Time: 09/16/2019/12:41:53 PM    Final    Structural Heart Procedure  Result Date: 09/16/2019 See surgical note for result.  CT Angio Abd/Pel w/ and/or w/o  Result Date: 08/21/2019 CLINICAL DATA:  Severe symptomatic aortic stenosis. Pre-TAVR evaluation. EXAM: CT ANGIOGRAPHY CHEST, ABDOMEN AND PELVIS TECHNIQUE: Multidetector CT imaging through the chest, abdomen and pelvis was performed using the standard protocol during bolus administration of intravenous contrast. Multiplanar reconstructed images and MIPs were obtained and reviewed to evaluate the vascular anatomy. CONTRAST:  159m OMNIPAQUE IOHEXOL  350 MG/ML SOLN COMPARISON:  04/17/2003 chest CT. 08/20/2018 CT abdomen/pelvis. FINDINGS: CTA CHEST FINDINGS Cardiovascular:  Mild cardiomegaly. Diffuse thickening and coarse calcification of the aortic valve. No significant pericardial effusion/thickening. Three-vessel coronary atherosclerosis. Atherosclerotic nonaneurysmal thoracic aorta. Top-normal caliber main pulmonary artery (3.4 cm diameter). No central pulmonary emboli. Mediastinum/Nodes: No discrete thyroid nodules. Unremarkable esophagus. No pathologically enlarged axillary, mediastinal or hilar lymph nodes. Lungs/Pleura: No pneumothorax. No pleural effusion. No acute consolidative airspace disease, lung masses or significant pulmonary nodules. Musculoskeletal: No aggressive appearing focal osseous lesions. Marked thoracic spondylosis. CTA ABDOMEN AND PELVIS FINDINGS Hepatobiliary: Normal liver with no liver mass. Normal gallbladder with no radiopaque cholelithiasis. No biliary ductal dilatation. Pancreas: Low-attenuation 1.3 cm focus in the pancreatic body (series 15/image 116). No additional potential pancreatic lesions. No pancreatic duct dilation. Spleen: Normal size. No mass. Adrenals/Urinary Tract: Right adrenal 1.1 cm nodule with density 49 HU, stable since 04/17/2003 CT, compatible with a benign adenoma. No left adrenal nodules. No hydronephrosis. No contour deforming renal masses. Normal bladder. Stomach/Bowel: Normal non-distended stomach. Normal caliber small bowel with no small bowel wall thickening. Appendectomy. Mild sigmoid diverticulosis, with no large bowel wall thickening or significant pericolonic fat stranding. Vascular/Lymphatic: Atherosclerotic nonaneurysmal abdominal aorta. No pathologically enlarged lymph nodes in the abdomen or pelvis. Reproductive: Mild prostatomegaly. Other: No pneumoperitoneum, ascites or focal fluid collection. Musculoskeletal: No aggressive appearing focal osseous lesions. Marked lumbar spondylosis. VASCULAR MEASUREMENTS PERTINENT TO TAVR: AORTA: Minimal Aortic Diameter-14.1 x 13.2 mm Severity of Aortic Calcification-moderate RIGHT  PELVIS: Right Common Iliac Artery - Minimal Diameter-10.9 x 10.7 mm Tortuosity-mild Calcification-mild Right External Iliac Artery - Minimal Diameter-8.4 x 8.3 mm Tortuosity-moderate Calcification-mild Right Common Femoral Artery - Minimal Diameter-8.4 x 7.8 mm Tortuosity-mild Calcification-mild LEFT PELVIS: Left Common Iliac Artery - Minimal Diameter-11.4 x 9.6 mm Tortuosity-mild to moderate Calcification-mild Left External Iliac Artery - Minimal Diameter-9.7 x 8.4 mm Tortuosity-mild Calcification-mild Left Common Femoral Artery - Minimal Diameter-9.8 x 9.5 mm Tortuosity-mild Calcification-mild Review of the MIP images confirms the above findings. IMPRESSION: 1. Vascular findings and measurements pertinent to potential TAVR procedure, as detailed. 2. Diffuse thickening and coarse calcification of the aortic valve, compatible with the reported history of severe symptomatic aortic stenosis. 3. Low-attenuation 1.3 cm focus in the pancreatic body. No biliary or pancreatic duct dilation. MRI abdomen without and with IV contrast recommended for further characterization. 4. Mild cardiomegaly. 5. Stable right adrenal adenoma. 6. Mild sigmoid diverticulosis. 7. Mild prostatomegaly. 8. Aortic Atherosclerosis (ICD10-I70.0). Electronically Signed   By: Ilona Sorrel M.D.   On: 08/21/2019 12:33   Disposition   Pt is being discharged home today in good condition.  Follow-up Plans & Appointments     Follow-up Information    Eileen Stanford, PA-C. Go on 10/02/2019.   Specialties: Cardiology, Radiology Why: @ 1:30pm, please arrive at least 10 minutes early.  Contact information: [address] [place] [id number] [phone]        Constance Haw, MD Follow up on 12/25/2019.   Specialty: Cardiology Why: at 2 pm for 3 month post pacemaker visit Contact information: [address] [place] [address] [place] Follow up on 10/02/2019.   Why: at 1130 am for post pacemaker wound check Contact information: [place] [address] [phone]       Kathyrn Lass, MD. Go on 09/25/2019.   Specialty: Family Medicine Why: @  10:15 am for your coumadin check  Contact information: [place] [address] [phone]                Discharge Medications   Allergies as of 09/19/2019      Reactions   Macrobid [nitrofurantoin]    Nystatin    Prevnar 13 [pneumococcal 13-val Conj Vacc]    Bisoprolol-hydrochlorothiazide Other (See Comments)   "Severe" arthralgias and myalgias   Nsaids Other (See Comments)   GI upset, Peptic ulcer disease      Medication List    TAKE these medications   acetaminophen 650 MG CR tablet Commonly known as: TYLENOL Take 1,300 mg by mouth every 8 (eight) hours as needed for pain.   aspirin 81 MG chewable tablet Chew 1 tablet (81 mg total) by mouth daily. Start taking on: September 20, 2019   atorvastatin 20 MG tablet Commonly known as: LIPITOR Take 20 mg by mouth daily at 6 PM.   Coumadin 6 MG tablet Generic drug: warfarin Take 3-6 mg by mouth See admin instructions. Take 0.5 tablet (3 mg) by mouth on Thursdays & Saturdays & take 1 tablet (6 mg) by mouth on Sundays, Mondays, Tuesdays, Wednesdays, & Fridays at night.   diltiazem 120 MG 24 hr capsule Commonly known as: CARDIZEM CD Take 120 mg by mouth daily at 6 PM.   erythromycin ophthalmic ointment Place 1 application into the left eye 3 (three) times daily. Place ointment to the lower area of the   HYDROcodone-acetaminophen 5-325 MG tablet Commonly known as: Norco Take 1 tablet by mouth every 4 (four) hours as needed for moderate pain or severe pain.   lisinopril 10 MG tablet Commonly known as: ZESTRIL Take 10 mg by mouth daily.   metFORMIN 500 MG 24 hr tablet Commonly known as: GLUCOPHAGE-XR Take 1 tablet (500 mg  total) by mouth 2 (two) times daily. What changed: how much to take   metoprolol succinate 100 MG 24 hr tablet Commonly known as: TOPROL-XL Take 100 mg by mouth daily. Take with or immediately following a meal.   ONE TOUCH ULTRA TEST test strip Generic drug: glucose blood 1 each by Other route as needed (BLOOD SUGAR).   OneTouch Delica Lancets 46E Misc Apply 1 each topically daily as needed (BLOOD SUGAR).   pioglitazone 15 MG tablet Commonly known as: ACTOS Take 15 mg by mouth daily.   PRESERVISION AREDS 2 PO Take 1 tablet by mouth daily.            Durable Medical Equipment  (From admission, onward)         Start     Ordered   09/19/19 1033  For home use only DME 4 wheeled rolling walker with seat  Once       Question:  Patient needs a walker to treat with the following condition  Answer:  Impaired gait and mobility   09/19/19 1032              Outstanding Labs/Studies   none  Duration of Discharge Encounter   Greater than 30 minutes including physician time.  SignedAngelena Form, PA-C 09/19/2019, 11:12 AM [phone]

## 2019-09-18 NOTE — Discharge Instructions (Signed)
ACTIVITY AND EXERCISE . Daily activity and exercise are an important part of your recovery. People recover at different rates depending on their general health and type of valve procedure. . Most people recovering from TAVR feel better relatively quickly  . No lifting, pushing, pulling more than 10 pounds (examples to avoid: groceries, vacuuming, gardening, golfing):             - For one week with a procedure through the groin.             - For six weeks for procedures through the chest wall or neck. NOTE: You will typically see one of our providers 7-14 days after your procedure to discuss [place] the above activities.      DRIVING . Do not drive until you are seen for follow up and cleared by a provider. Generally, we ask patient to not drive for 1 week after their procedure. . If you have been told by your doctor in the past that you may not drive, you must talk with him/her before you begin driving again.   DRESSING . Groin site: you may leave the clear dressing over the site for up to one week or until it falls off.   HYGIENE . If you had a femoral (leg) procedure, you may take a shower when you return home. After the shower, pat the site dry. Do NOT use powder, oils or lotions in your groin area until the site has completely healed. . If you had a chest procedure, you may shower when you return home unless specifically instructed not to by your discharging practitioner.             - DO NOT scrub incision; pat dry with a towel.             - DO NOT apply any lotions, oils, powders to the incision.             - No tub baths / swimming for at least 2 weeks. . If you notice any fevers, chills, increased pain, swelling, bleeding or pus, please contact your doctor.   ADDITIONAL INFORMATION . If you are going to have an upcoming dental procedure, please contact our office as you will require antibiotics ahead of time to prevent infection on your heart valve.    If you have any  questions or concerns you can call the structural heart phone during normal business hours 8am-4pm. If you have an urgent need after hours or weekends please call [phone] to talk to the on call provider for general cardiology. If you have an emergency that requires immediate attention, please call 911.    After TAVR Checklist  Check  Test Description   Follow up appointment in 1-2 weeks  You will see our structural heart physician assistant, Nell Range. Your incision sites will be checked and you will be cleared to drive and resume all normal activities if you are doing well.     1 month echo and follow up  You will have an echo to check on your new heart valve and be seen back in the office by Nell Range. Many times the echo is not read by your appointment time, but Joellen Jersey will call you later that day or the following day to report your results.   Follow up with your primary cardiologist You will need to be seen by your primary cardiologist in the following 3-6 months after your 1 month appointment in the valve  clinic. Often times your Plavix or Aspirin will be discontinued during this time, but this is decided on a case by case basis.    1 year echo and follow up You will have another echo to check on your heart valve after 1 year and be seen back in the office by Nell Range. This your last structural heart visit.   Bacterial endocarditis prophylaxis  You will have to take antibiotics for the rest of your life before all dental procedures (even teeth cleanings) to protect your heart valve. Antibiotics are also required before some surgeries. Please check with your cardiologist before scheduling any surgeries. Also, please make sure to tell us if you have a penicillin allergy as you will require an alternative antibiotic.     After Your Pacemaker   . You have a [employer]onic Pacemaker  ACTIVITY . Do not lift your arm above shoulder height for 1 week after your procedure. After 7 days,  you may progress as below.     Thursday September 25, 2019  Friday September 26, 2019 Saturday September 27, 2019 Sunday September 28, 2019   . Do not lift, push, pull, or carry anything over 10 pounds with the affected arm until 6 weeks (Thursday October 30, 2019 ) after your procedure.   . Do NOT DRIVE until you have been seen for your wound check, or as long as instructed by your healthcare provider.   . Ask your healthcare provider when you can go back to work   INCISION/Dressing . If you are on a blood thinner such as Coumadin, Xarelto, Eliquis, Plavix, or Pradaxa please confirm with your provider when this should be resumed.   . Monitor your Pacemaker site for redness, swelling, and drainage. Call the device clinic at [phone] if you experience these symptoms or fever/chills.  . If your incision is sealed with Steri-strips or staples, you may shower 10 days after your procedure or when told by your provider. Do not remove the steri-strips or let the shower hit directly on your site. You may wash around your site with soap and water.    Marland Kitchen Avoid lotions, ointments, or perfumes over your incision until it is well-healed.  . You may use a hot tub or a pool AFTER your wound check appointment if the incision is completely closed.  Marland Kitchen PAcemaker Alerts:  Some alerts are vibratory and others beep. These are NOT emergencies. Please call our office to let us know. If this occurs at night or on weekends, it can wait until the next business day. Send a remote transmission.  . If your device is capable of reading fluid status (for heart failure), you will be offered monthly monitoring to review this with you.   DEVICE MANAGEMENT . Remote monitoring is used to monitor your pacemaker from home. This monitoring is scheduled every 91 days by our office. It allows [place] to keep an eye on the functioning of your device to ensure it is working properly. You will routinely see your Electrophysiologist annually (more often if  necessary).   . You should receive your ID card for your new device in 4-8 weeks. Keep this card with you at all times once received. Consider wearing a medical alert bracelet or necklace.  . Your Pacemaker may be MRI compatible. This will be discussed at your next office visit/wound check.  You should avoid contact with strong electric or magnetic fields.    Do not use amateur (ham) radio equipment or electric (arc) welding  torches. MP3 player headphones with magnets should not be used. Some devices are safe to use if held at least 12 inches (30 cm) from your Pacemaker. These include power tools, lawn mowers, and speakers. If you are unsure if something is safe to use, ask your health care provider.   When using your cell phone, hold it to the ear that is on the opposite side from the Pacemaker. Do not leave your cell phone in a pocket over the Pacemaker.   You may safely use electric blankets, heating pads, computers, and microwave ovens.  Call the office right away if:  You have chest pain.  You feel more short of breath than you have felt before.  You feel more light-headed than you have felt before.  Your incision starts to open up.  This information is not intended to replace advice given to you by your health care provider. Make sure you discuss any questions you have with your health care provider.

## 2019-09-18 NOTE — Progress Notes (Signed)
ANTICOAGULATION CONSULT NOTE - Initial Consult  Pharmacy Consult for Warfarin Indication: atrial fibrillation  Allergies  Allergen Reactions  . Macrobid [Nitrofurantoin]   . Nystatin   . Prevnar 13 [Pneumococcal 13-Val Conj Vacc]   . Bisoprolol-Hydrochlorothiazide Other (See Comments)    "Severe" arthralgias and myalgias  . Nsaids Other (See Comments)    GI upset, Peptic ulcer disease    Patient Measurements: Height: 5' 10.5" (179.1 cm) Weight: 112 kg (247 lb) IBW/kg (Calculated) : 74.15   Vital Signs: Temp: 98 F (36.7 C) (06/17 0800) Temp Source: Oral (06/17 0800) BP: 110/46 (06/17 0800) Pulse Rate: 59 (06/17 0800)  Labs: Recent Labs    09/16/19 0832 09/16/19 1013 09/16/19 1246 09/16/19 1246 09/17/19 0458 09/18/19 0241  HGB  --    < > 11.9*   < > 12.1* 11.5*  HCT  --    < > 35.0*  --  37.6* 35.2*  PLT  --   --   --   --  156 128*  APTT 29  --   --   --   --   --   LABPROT 15.4*  --   --   --   --   --   INR 1.3*  --   --   --   --   --   CREATININE  --    < > 1.10  --  0.98 1.02   < > = values in this interval not displayed.    Estimated Creatinine Clearance: 74.2 mL/min (by C-G formula based on SCr of 1.02 mg/dL).   Medical History: Past Medical History:  Diagnosis Date  . 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 Arthritis   . Chronic atrial fibrillation ([place])   . Chronic diastolic CHF (congestive heart failure) ([place])   . Diabetes mellitus type 2, noninsulin dependent ([place])   . History of blood transfusion    5 pints for peptic ulcer  . History of kidney stones    Passed x 2  . Hypertension   . Mitral valve disease    mild-moderate MR, moderate MS 07/15/19 echo  . 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 Ruptured disk   . S/P TAVR (transcatheter aortic valve replacement) 09/16/2019   s/p TAVR with a 29 mm Edwards Sapien 3 THV via the TF approach by Dr.  Burt Knack and Dr. Cyndia Bent  . Severe aortic stenosis   . Ulcer    peptic Ulcer - bleeding     Assessment: 79 yo male admitted with complete AV block. Pt underwent TAVR and pacemaker implantation. INR from 6/15 1.3, warfarin has been held for surgery since 6/10. Pharmacy now asked to resume warfarin. CBc stable this am, no S/Sx bleeding noted.  *Home warfarin dose = 6mg  daily except 3mg  Thurs/Sat  Goal of Therapy:  INR 2-3 Monitor platelets by anticoagulation protocol: Yes   Plan:  -Warfarin 6mg  PO x1 tonight -Daily INR   Arrie Senate, PharmD, BCPS Clinical Pharmacist [phone] Please check AMION for all [facility] numbers 09/18/2019

## 2019-09-18 NOTE — Progress Notes (Signed)
1 Day Post-Op Procedure(s) (LRB): PACEMAKER IMPLANT (N/A) Subjective: No complaints  Reportedly confused overnight.  Objective: Vital signs in last 24 hours: Temp:  [98 F (36.7 C)-98.4 F (36.9 C)] 98.2 F (36.8 C) (06/17 0400) Pulse Rate:  [36-171] 60 (06/17 0400) Cardiac Rhythm: Ventricular paced (06/16 2000) Resp:  [9-51] 20 (06/17 0400) BP: (97-176)/(32-145) 128/39 (06/17 0400) SpO2:  [92 %-100 %] 98 % (06/17 0400) Arterial Line BP: (119-149)/(19-47) 148/19 (06/16 0945)  Hemodynamic parameters for last 24 hours:    Intake/Output from previous day: 06/16 0701 - 06/17 0700 In: 573 [P.O.:240; I.V.:233; IV Piggyback:100] Out: -  Intake/Output this shift: No intake/output data recorded.  General appearance: alert and cooperative Neurologic: intact Heart: regular rate and rhythm, S1, S2 normal, no murmur, click, rub or gallop Lungs: clear to auscultation bilaterally Extremities: extremities normal, atraumatic, no cyanosis or edema Wound: pacer site ok. small amount of bloody drainage on left chest dressing but no hematoma. groin sites ok.  Lab Results: Recent Labs    09/17/19 0458 09/18/19 0241  WBC 11.1* 9.6  HGB 12.1* 11.5*  HCT 37.6* 35.2*  PLT 156 128*   BMET:  Recent Labs    09/17/19 0458 09/18/19 0241  NA 135 136  K 4.4 4.1  CL 104 105  CO2 21* 21*  GLUCOSE 122* 103*  BUN 10 13  CREATININE 0.98 1.02  CALCIUM 8.4* 8.2*    PT/INR:  Recent Labs    09/16/19 0832  LABPROT 15.4*  INR 1.3*   ABG    Component Value Date/Time   PHART 7.421 09/12/2019 0955   HCO3 22.5 09/12/2019 0955   TCO2 22 09/16/2019 1246   ACIDBASEDEF 1.3 09/12/2019 0955   O2SAT 98.6 09/12/2019 0955   CBG (last 3)  Recent Labs    09/17/19 1848 09/17/19 2116 09/18/19 0609  GLUCAP 101* 176* 94   CXR: ok  Assessment/Plan:  POD 2 TAVR  POD 1 PPM  He is hemodynamically stable VVI paced at 60. Will get him up to walk today and if he does well he could go home with  brother tomorrow.  Resume anticoagulation when ok with EP.  2D echo reviewed and looks good with mean AV gradient of 8, trivial paravalvular leak, normal LVEF.  LOS: 2 days    Gregory [name] 09/18/2019

## 2019-09-19 LAB — PROTIME-INR
INR: 1.3 — ABNORMAL HIGH (ref 0.8–1.2)
Prothrombin Time: 15.7 seconds — ABNORMAL HIGH (ref 11.4–15.2)

## 2019-09-19 LAB — GLUCOSE, CAPILLARY
Glucose-Capillary: 151 mg/dL — ABNORMAL HIGH (ref 70–99)
Glucose-Capillary: 266 mg/dL — ABNORMAL HIGH (ref 70–99)

## 2019-09-19 MED ORDER — ASPIRIN 81 MG PO CHEW: 81.0000 mg | CHEWABLE_TABLET | Freq: Every day | ORAL | Status: DC

## 2019-09-19 MED ORDER — WARFARIN SODIUM 5 MG PO TABS: 6.0000 mg | ORAL_TABLET | Freq: Once | ORAL | Status: DC

## 2019-09-19 NOTE — Progress Notes (Signed)
CARDIAC REHAB PHASE I   Offered to walk with pt, pt states ambulation earlier this am. Breakfast ordered for pt, will f/u after pt eats.   [phone] Rufina Falco, RN BSN 09/19/2019 8:23 AM

## 2019-09-19 NOTE — Progress Notes (Signed)
Pt getting RW for home. Encouraged walking daily to increase strength and discussed CRPII. He is thinking about it. Will refer to [place]. Discussed restrictions. [place], ACSM 11:21 AM 09/19/2019

## 2019-09-19 NOTE — Progress Notes (Signed)
Outer dressing removed with plans for home today. Wound site stable. Instruction reviewed again. No questions at this time.   Please call with any questions.   Legrand Como [address]" [place], PA-C  09/19/2019 8:32 AM

## 2019-09-19 NOTE — Progress Notes (Signed)
D/C instructions given to patient. Medications and wound care reviewed. Arm restrictions reviewed. IV x2 removed, clean and intact. RW delivered to bedside. Pt to be escort home by brother with all belongings.  Clyde Canterbury, RN

## 2019-09-19 NOTE — Progress Notes (Signed)
[facility] for Warfarin Indication: atrial fibrillation  Allergies  Allergen Reactions  . Macrobid [Nitrofurantoin]   . Nystatin   . Prevnar 13 [Pneumococcal 13-Val Conj Vacc]   . Bisoprolol-Hydrochlorothiazide Other (See Comments)    "Severe" arthralgias and myalgias  . Nsaids Other (See Comments)    GI upset, Peptic ulcer disease    Patient Measurements: Height: 5' 10.5" (179.1 cm) Weight: 115.7 kg (255 lb) IBW/kg (Calculated) : 74.15   Vital Signs: Temp: 98.8 F (37.1 C) (06/18 0403) Temp Source: Oral (06/18 0403) BP: 117/48 (06/18 0403) Pulse Rate: 61 (06/18 0403)  Labs: Recent Labs    09/16/19 0832 09/16/19 1013 09/16/19 1246 09/16/19 1246 09/17/19 0458 09/18/19 0241 09/19/19 0345  HGB  --    < > 11.9*   < > 12.1* 11.5*  --   HCT  --    < > 35.0*  --  37.6* 35.2*  --   PLT  --   --   --   --  156 128*  --   APTT 29  --   --   --   --   --   --   LABPROT 15.4*  --   --   --   --   --  15.7*  INR 1.3*  --   --   --   --   --  1.3*  CREATININE  --    < > 1.10  --  0.98 1.02  --    < > = values in this interval not displayed.    Estimated Creatinine Clearance: 75.4 mL/min (by C-G formula based on SCr of 1.02 mg/dL).   Medical History: Past Medical History:  Diagnosis Date  . 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 Arthritis   . Chronic atrial fibrillation ([place])   . Chronic diastolic CHF (congestive heart failure) ([place])   . Diabetes mellitus type 2, noninsulin dependent ([place])   . History of blood transfusion    5 pints for peptic ulcer  . History of kidney stones    Passed x 2  . Hypertension   . Mitral valve disease    mild-moderate MR, moderate MS 07/15/19 echo  . 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 Ruptured disk   . S/P TAVR (transcatheter aortic valve replacement) 09/16/2019   s/p TAVR with a 29 mm Edwards Sapien  3 THV via the TF approach by Dr. Burt Knack and Dr. Cyndia Bent  . Severe aortic stenosis   . Ulcer    peptic Ulcer - bleeding     Assessment: 79 yo male admitted with complete AV block. Pt underwent TAVR and pacemaker implantation. INR from 6/15 1.3, warfarin has been held for surgery since 6/10. Pharmacy now asked to resume warfarin (restarted 6/17). CBc stable this am.  -INR= 1.3  *Home warfarin dose = 6mg  daily except 3mg  Thurs/Sat  Goal of Therapy:  INR 2-3 Monitor platelets by anticoagulation protocol: Yes   Plan:  -Warfarin 6mg  PO x1 tonight -Upon discharge would resume home warfarin regimen -Daily INR  Hildred Laser, PharmD Clinical Pharmacist **Pharmacist phone directory can now be found on [email or web address] ([email or web address]).  Listed under [place].

## 2019-09-22 ENCOUNTER — Telehealth: Payer: Self-pay | Admitting: Physician Assistant

## 2019-09-22 NOTE — Telephone Encounter (Signed)
Attempted TOC call. Phone just rings and rings. No answer.   Angelena Form PA-C  [facility]

## 2019-09-24 ENCOUNTER — Telehealth (HOSPITAL_COMMUNITY): Payer: Self-pay

## 2019-09-24 NOTE — Telephone Encounter (Signed)
Pt insurance is active and benefits verified through [employer] adv Co-pay $10, DED 0/0 met, out of pocket $3,100/$30 met, co-insurance 0%. no pre-authorization required. Passport, Maria/[employer] 09/24/2019_0 :16am, REF# [id number]  Will contact patient to see if he is interested in the Cardiac Rehab Program. If interested, patient will need to complete follow up appt. Once completed, patient will be contacted for scheduling upon review by the RN Navigator.

## 2019-09-25 DIAGNOSIS — Z7901 Long term (current) use of anticoagulants: Secondary | ICD-10-CM | POA: Diagnosis not present

## 2019-10-01 DIAGNOSIS — Z7901 Long term (current) use of anticoagulants: Secondary | ICD-10-CM | POA: Diagnosis not present

## 2019-10-01 DIAGNOSIS — Z952 Presence of prosthetic heart valve: Secondary | ICD-10-CM | POA: Diagnosis not present

## 2019-10-01 DIAGNOSIS — Z950 Presence of cardiac pacemaker: Secondary | ICD-10-CM | POA: Diagnosis not present

## 2019-10-01 DIAGNOSIS — Z6836 Body mass index (BMI) 36.0-36.9, adult: Secondary | ICD-10-CM | POA: Diagnosis not present

## 2019-10-01 DIAGNOSIS — E1140 Type 2 diabetes mellitus with diabetic neuropathy, unspecified: Secondary | ICD-10-CM | POA: Diagnosis not present

## 2019-10-01 NOTE — Progress Notes (Signed)
[facility] [place]                                       Cardiology Office Note    Date:  10/02/2019   ID:  Gregory [name], DOB 23-Aug-1940, MRN [id number]  PCP:  Kathyrn Lass, MD  Cardiologist: Larae Grooms, MD / Dr. Burt Knack & Dr. Cyndia Bent (TAVR)  CC: TOC s/p TAVR  History of Present Illness:  Gregory [name] is a 79 y.o. male with a history of persistant atrial fibrillation on coumadin, RBBB,chronic diastolic CHF, mitral regurgitation, Non Hodgkins lymphoma, DM, HTN, severe MAC with mod MS/MR and severe aortic stenosis s/p TAVR (09/16/19) c/b CHB s/p PPM (09/17/19) who presents to clinic for follow up.   Most recent echo April 2021 with LVEF=55%, mild MR, mild to moderate mitral stenosis and severe aortic stenosis with mean gradient 44 mmHg, peak gradient 74 mmHg, AVA 0.69 cm2, dimensionless index 0.17. Cardiac cath May 2021 with no significant CAD. He underwent dental extractions on 09/04/19.  He was evaluated by the multidisciplinary valve team and underwent successful TAVR with a 29 mm Edwards Sapien 3 THV via the TF approach on 09/16/19. Post operative echo showed EF 55-60%, normally functioning TAVR with a mean gradient of 11.1 mm Hg and trivial perivalvular leak at the posterior aspect of the aortic prosthesis. Post TAVR he developed CHB that never resolved. His temp wire was left in place and he underwent PPM with a Medtronic Azure XT DR MRI SureScan single-chamber pacemaker on 09/17/19 by Dr. Curt Bears. Of note, the pt had left sided subclavian occlusion and required PM implantation on the right side. He was resumed on coumadin with the addition of a baby aspirin which will be continued x 34month.  Today he presents to clinic for follow up. No CP or SOB. Has had some mild LE edema since leaving the hospital but no orthopnea or PND. No dizziness or syncope. No blood in stool or urine. No palpitations. He says he has an intolerances to aspirin and  tylenol and it causes him to have visual disturbance and fatigue.    Past Medical History:  Diagnosis Date  . 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 Arthritis   . Chronic [place]   . Chronic diastolic CHF (congestive heart failure) ([place]   . Diabetes mellitus type 2, noninsulin dependent ([place]   . History of blood transfusion    5 pints for peptic ulcer  . History of kidney stones    Passed x 2  . Hypertension   . Mitral valve disease    mild-moderate MR, moderate MS 07/15/19 echo  . 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 Ruptured disk   . S/P TAVR (transcatheter aortic valve replacement) 09/16/2019   s/p TAVR with a 29 mm Edwards Sapien 3 THV via the TF approach by Dr. CBurt Knackand Dr. BCyndia Bent . Severe aortic stenosis   . Ulcer    peptic Ulcer - bleeding    Past Surgical History:  Procedure Laterality Date  . APPENDECTOMY  1967  . COLONOSCOPY    . left knee surgery    . left shoulder surgery    . MULTIPLE EXTRACTIONS WITH ALVEOLOPLASTY N/A 09/04/2019   Procedure: Extraction of tooth #'  s 2,7,8,9,11,13,14 20,30, and 31 with alveoloplatsty and gross debridement of remaining teeth;  Surgeon: Lenn Cal, DDS;  Location: [facility];  Service: Oral Surgery;  Laterality: N/A;  . PACEMAKER IMPLANT N/A 09/17/2019   Procedure: PACEMAKER IMPLANT;  Surgeon: Constance Haw, MD;  Location: [facility];  Service: Cardiovascular;  Laterality: N/A;  . RIGHT/LEFT HEART CATH AND CORONARY ANGIOGRAPHY N/A 08/12/2019   Procedure: RIGHT/LEFT HEART CATH AND CORONARY ANGIOGRAPHY;  Surgeon: Jettie Booze, MD;  Location: [facility];  Service: Cardiovascular;  Laterality: N/A;  . TEE WITHOUT CARDIOVERSION N/A 09/16/2019   Procedure: TRANSESOPHAGEAL ECHOCARDIOGRAM (TEE);  Surgeon: Sherren Mocha, MD;  Location: [facility];  Service: Open Heart Surgery;   Laterality: N/A;  . TONSILLECTOMY    . TRANSCATHETER AORTIC VALVE REPLACEMENT, TRANSFEMORAL N/A 09/16/2019   Procedure: TRANSCATHETER AORTIC VALVE REPLACEMENT, TRANSFEMORAL;  Surgeon: Sherren Mocha, MD;  Location: [facility];  Service: Open Heart Surgery;  Laterality: N/A;    Current Medications: Outpatient Medications Prior to Visit  Medication Sig Dispense Refill  . acetaminophen (TYLENOL) 650 MG CR tablet Take 1,300 mg by mouth every 8 (eight) hours as needed for pain.    Marland Kitchen atorvastatin (LIPITOR) 20 MG tablet Take 20 mg by mouth daily at 6 PM.     . diltiazem (CARDIZEM CD) 120 MG 24 hr capsule Take 120 mg by mouth daily at 6 PM.    . lisinopril (PRINIVIL,ZESTRIL) 10 MG tablet Take 10 mg by mouth daily.    . metFORMIN (GLUCOPHAGE-XR) 500 MG 24 hr tablet Take 1 tablet (500 mg total) by mouth 2 (two) times daily.    . metoprolol succinate (TOPROL-XL) 100 MG 24 hr tablet Take 100 mg by mouth daily. Take with or immediately following a meal.    . Multiple Vitamins-Minerals (PRESERVISION AREDS 2 PO) Take 1 tablet by mouth daily.    . ONE TOUCH ULTRA TEST test strip 1 each by Other route as needed (BLOOD SUGAR).     Glory Rosebush DELICA LANCETS 35T MISC Apply 1 each topically daily as needed (BLOOD SUGAR).     Marland Kitchen pioglitazone (ACTOS) 15 MG tablet Take 15 mg by mouth daily.     Marland Kitchen warfarin (COUMADIN) 6 MG tablet Take 3-6 mg by mouth See admin instructions. Take 0.5 tablet (3 mg) by mouth on Thursdays & Saturdays & take 1 tablet (6 mg) by mouth on Sundays, Mondays, Tuesdays, Wednesdays, & Fridays at night.    Marland Kitchen aspirin 81 MG chewable tablet Chew 1 tablet (81 mg total) by mouth daily.    Marland Kitchen erythromycin ophthalmic ointment Place 1 application into the left eye 3 (three) times daily. Place ointment to the lower area of the    . HYDROcodone-acetaminophen (NORCO) 5-325 MG tablet Take 1 tablet by mouth every 4 (four) hours as needed for moderate pain or severe pain. 24 tablet 0   No  facility-administered medications prior to visit.     Allergies:   Aspirin, Macrobid [nitrofurantoin], Nystatin, Prevnar 13 [pneumococcal 13-val conj vacc], Bisoprolol-hydrochlorothiazide, and Nsaids   Social History   Socioeconomic History  . Marital status: Single    Spouse name: Not on file  . Number of children: 0  . Years of education: Not on file  . Highest education level: Not on file  Occupational History  . Occupation: Retired-Trucking  Tobacco Use  . Smoking status: Never Smoker  . Smokeless tobacco: Never Used  Vaping Use  . Vaping Use: Never  used  Substance and Sexual Activity  . Alcohol use: No  . Drug use: No  . Sexual activity: Never  Other Topics Concern  . Not on file  Social History Narrative  . Not on file   Social Determinants of Health   Financial Resource Strain:   . Difficulty of Paying Living Expenses:   Food Insecurity:   . Worried About Charity fundraiser in the Last Year:   . Arboriculturist in the Last Year:   Transportation Needs:   . Film/video editor (Medical):   Marland Kitchen Lack of Transportation (Non-Medical):   Physical Activity:   . Days of Exercise per Week:   . Minutes of Exercise per Session:   Stress:   . Feeling of Stress :   Social Connections:   . Frequency of Communication with Friends and Family:   . Frequency of Social Gatherings with Friends and Family:   . Attends Religious Services:   . Active Member of Clubs or Organizations:   . Attends Archivist Meetings:   Marland Kitchen Marital Status:      Family History:  The patient's family history includes Cancer in his mother; Heart attack in his father and mother; Hypertension in his father.     ROS:   Please see the history of present illness.    ROS All other systems reviewed and are negative.   PHYSICAL EXAM:   VS:  BP (!) 116/58   Pulse 82   Ht _0  (1.778 m)   Wt 248 lb 12.8 oz (112.9 kg)   SpO2 97%   BMI 35.70 kg/m    GEN: Well nourished, well developed,  in no acute distress, obese HEENT: normal Neck: no JVD or masses Cardiac: irreg irreg; soft flow murmur. No rubs, or gallops. 1 + bilateral LE edema  Respiratory:  clear to auscultation bilaterally, normal work of breathing GI: soft, nontender, nondistended, + BS MS: no deformity or atrophy Skin: warm and dry, no rash.  Groin sites clear without hematoma or ecchymosis  Neuro:  Alert and Oriented x 3, Strength and sensation are intact Psych: euthymic mood, full affect   Wt Readings from Last 3 Encounters:  10/02/19 248 lb 12.8 oz (112.9 kg)  09/19/19 255 lb (115.7 kg)  09/12/19 252 lb 9 oz (114.6 kg)      Studies/Labs Reviewed:   EKG:  EKG is NOT ordered today  Recent Labs: 09/12/2019: ALT 24; B Natriuretic Peptide 295.1 09/17/2019: Magnesium 1.6 09/18/2019: BUN 13; Creatinine, Ser 1.02; Hemoglobin 11.5; Platelets 128; Potassium 4.1; Sodium 136   Lipid Panel No results found for: CHOL, TRIG, HDL, CHOLHDL, VLDL, LDLCALC, LDLDIRECT  Additional studies/ records that were reviewed today include:  TAVR OPERATIVE NOTE   Date of Procedure:09/16/2019  Preoperative Diagnosis:Severe Aortic Stenosis   Postoperative Diagnosis:Same   Procedure:   Transcatheter Aortic Valve Replacement - PercutaneousRightTransfemoral Approach Edwards Sapien 3 THV (size 47m, model # 9600TFX, serial ##[id number]  Co-Surgeons:Bryan KAlveria Apley MD and MSherren Mocha MD    Anesthesiologist:Carswell JGlennon Mac MD  Echocardiographer:M. Croitoru, MD  Pre-operative Echo Findings: ? Severe aortic stenosis ? Normalleft ventricular systolic function  Post-operative Echo Findings: ? Mildparavalvular leak ? Normalleft ventricular systolic function  _____________   Echo 09/16/19 IMPRESSIONS  1. Left ventricular ejection fraction, by estimation, is 55 to 60%. The  left  ventricle has normal function. The left ventricle has no regional  wall motion abnormalities. There is mild concentric left ventricular  hypertrophy. Left ventricular diastolic  function could not be evaluated.  2. Right ventricular systolic function is normal. The right ventricular  size is normal. There is moderately elevated pulmonary artery systolic  pressure. The estimated right ventricular systolic pressure is 56.7 mmHg.  3. Left atrial size was mild to moderately dilated.  4. The mitral valve is degenerative. Moderate mitral valve regurgitation.  Moderate mitral stenosis. The mean mitral valve gradient is 7.0 mmHg with average heart rate of 60 bpm.  5. The aortic valve has been repaired/replaced. Aortic valve  regurgitation is trivial. There is a 29 mm Edwards Sapien prosthetic  (TAVR) valve present in the aortic position. Procedure Date: 09/16/2019.  Trivial perivalvular leak at the posterior aspect  of the aortic prosthesis.  6. The inferior vena cava is dilated in size with <50% respiratory  variability, suggesting right atrial pressure of 15 mmHg.  Comparison(s): No significant change from prior study. Prior images  reviewed side by side.   _______________   09/17/19 PACEMAKER IMPLANT  Conclusion SURGEON:  Will Meredith Leeds, MD     PREPROCEDURE DIAGNOSIS:  Complete heart block    POSTPROCEDURE DIAGNOSIS:  Complete heart block     PROCEDURES:   1. Left and right upper extremity venography.   2. Pacemaker implantation.     ASSESSMENT & PLAN:   Severe AS s/p TAVR:doing well. Groin sites are healing well. He has been resumed on his home coumadin. We added a daily aspirin 81 mg daily. He says he has an intolerances to aspirin and tylenol and it causes him to have visual disturbance and fatigue and this is bothering him, so I have asked him to discontinue it and continue on Coumadin alone. SBE prophylaxis discussed; I have RX'd amoxicillin. He is looking forward to  getting his dentures placed. I will see him back later this month for follow up and echo.    CHB s/p PPM : wound check today.   Persistent afib: coumadin followed by PCP.   Chronic diastolic CHF: has some mild LE edema. He has some lasix 65m daily at home which was previously discontinued by urology. I have asked him to take this x 2 days. Then he can use PRN for swelling.    Medication Adjustments/Labs and Tests Ordered: Current medicines are reviewed at length with the patient today.  Concerns regarding medicines are outlined above.  Medication changes, Labs and Tests ordered today are listed in the Patient Instructions below. Patient Instructions  Medication Instructions:  1.) stop aspirin 2.) amoxicillin 500 mg tablets - TAKE 4 (FOUR) TABLETS BY MOUTH PRIOR TO ANY DENTAL CLEANINGS OR PROCEDURES 3.) furosemide (Lasix) 40 mg tablets - take one tablet by mouth as need for swelling  *If you need a refill on your cardiac medications before your next appointment, please call your pharmacy*   Lab Work: none If you have labs (blood work) drawn today and your tests are completely normal, you will receive your results only by: .Marland KitchenMyChart Message (if you have MyChart) OR . A paper copy in the mail If you have any lab test that is abnormal or we need to change your treatment, we will call you to review the results.   Testing/Procedures: Echo as scheduled.   Follow-Up: As scheduled with K. TBillee Cashing7/21/21  Other Instructions      Signed, KAngelena Form PA-C  10/02/2019 2:11 PM    [facility] [address] [place]  [address]Phone: [phone] Fax: [phone]

## 2019-10-02 ENCOUNTER — Other Ambulatory Visit: Payer: Self-pay

## 2019-10-02 ENCOUNTER — Encounter: Payer: Self-pay | Admitting: Physician Assistant

## 2019-10-02 ENCOUNTER — Ambulatory Visit (INDEPENDENT_AMBULATORY_CARE_PROVIDER_SITE_OTHER): Payer: PPO | Admitting: Emergency Medicine

## 2019-10-02 ENCOUNTER — Ambulatory Visit (INDEPENDENT_AMBULATORY_CARE_PROVIDER_SITE_OTHER): Payer: PPO | Admitting: Physician Assistant

## 2019-10-02 VITALS — BP 116/58 | HR 82 | Ht 70.0 in | Wt 248.8 lb

## 2019-10-02 DIAGNOSIS — I5032 Chronic diastolic (congestive) heart failure: Secondary | ICD-10-CM

## 2019-10-02 DIAGNOSIS — Z950 Presence of cardiac pacemaker: Secondary | ICD-10-CM | POA: Diagnosis not present

## 2019-10-02 DIAGNOSIS — Z952 Presence of prosthetic heart valve: Secondary | ICD-10-CM

## 2019-10-02 DIAGNOSIS — I442 Atrioventricular block, complete: Secondary | ICD-10-CM

## 2019-10-02 DIAGNOSIS — I4819 Other persistent atrial fibrillation: Secondary | ICD-10-CM | POA: Diagnosis not present

## 2019-10-02 MED ORDER — FUROSEMIDE 40 MG PO TABS
40.0000 mg | ORAL_TABLET | Freq: Every day | ORAL | 3 refills | Status: DC | PRN
Start: 2019-10-02 — End: 2021-10-04

## 2019-10-02 MED ORDER — AMOXICILLIN 500 MG PO CAPS
ORAL_CAPSULE | ORAL | 3 refills | Status: DC
Start: 2019-10-02 — End: 2020-10-20

## 2019-10-02 NOTE — Patient Instructions (Addendum)
Medication Instructions:  1.) stop aspirin 2.) amoxicillin 500 mg tablets - TAKE 4 (FOUR) TABLETS BY MOUTH PRIOR TO ANY DENTAL CLEANINGS OR PROCEDURES 3.) furosemide (Lasix) 40 mg tablets - take one tablet by mouth as need for swelling  *If you need a refill on your cardiac medications before your next appointment, please call your pharmacy*   Lab Work: none If you have labs (blood work) drawn today and your tests are completely normal, you will receive your results only by: Marland Kitchen MyChart Message (if you have MyChart) OR . A paper copy in the mail If you have any lab test that is abnormal or we need to change your treatment, we will call you to review the results.   Testing/Procedures: Echo as scheduled.   Follow-Up: As scheduled with K. Billee Cashing 10/22/19  Other Instructions

## 2019-10-02 NOTE — Patient Instructions (Signed)
Call the office if you have increased swelling, or you have any drainage or redness at the wound site. Call the office if you have a fever or chills,. If you develop a fever or chills after office hours or during the weekend go to the ED or an urgent care center to be evaluated.

## 2019-10-03 LAB — CUP PACEART INCLINIC DEVICE CHECK
Battery Remaining Longevity: 147 mo
Battery Voltage: 3.12 V
Brady Statistic RV Percent Paced: 98.32 %
Date Time Interrogation Session: 20210701121200
Implantable Lead Implant Date: 20210616
Implantable Lead Location: 753860
Implantable Lead Model: 5076
Implantable Pulse Generator Implant Date: 20210616
Lead Channel Impedance Value: 513 Ohm
Lead Channel Impedance Value: 608 Ohm
Lead Channel Pacing Threshold Amplitude: 0.5 V
Lead Channel Pacing Threshold Pulse Width: 0.4 ms
Lead Channel Setting Pacing Amplitude: 3.5 V
Lead Channel Setting Pacing Pulse Width: 0.4 ms
Lead Channel Setting Sensing Sensitivity: 4 mV

## 2019-10-03 NOTE — Progress Notes (Signed)
Wound check appointment. Steri-strips removed. Wound without redness or edema. Incision edges approximated, wound well healed. Normal device function. Thresholds, sensing, and impedances consistent with implant measurements. Device programmed at 3.5V/auto capture programmed on for extra safety margin until 3 month visit. Histogram distribution appropriate for patient and level of activity. No high ventricular rates noted. Patient educated about wound care, arm mobility, lifting restrictions. ROV with Dr Curt Bears on 12/25/19. Patient enrolled in remote follow up and next remote scheduled for 12/18/19.

## 2019-10-09 ENCOUNTER — Encounter (HOSPITAL_COMMUNITY): Payer: Self-pay

## 2019-10-09 NOTE — Telephone Encounter (Signed)
Attempted to call patient in regards to Cardiac Rehab - LM on VM Mailed letter 

## 2019-10-14 ENCOUNTER — Other Ambulatory Visit: Payer: Self-pay

## 2019-10-14 ENCOUNTER — Encounter (HOSPITAL_COMMUNITY): Payer: Self-pay | Admitting: Dentistry

## 2019-10-14 ENCOUNTER — Ambulatory Visit (HOSPITAL_COMMUNITY): Payer: Dental | Admitting: Dentistry

## 2019-10-14 VITALS — BP 135/63 | HR 75 | Temp 98.3°F

## 2019-10-14 DIAGNOSIS — K08199 Complete loss of teeth due to other specified cause, unspecified class: Secondary | ICD-10-CM

## 2019-10-14 DIAGNOSIS — K06010 Localized gingival recession, unspecified: Secondary | ICD-10-CM

## 2019-10-14 DIAGNOSIS — K0530 Chronic periodontitis, unspecified: Secondary | ICD-10-CM

## 2019-10-14 DIAGNOSIS — K08409 Partial loss of teeth, unspecified cause, unspecified class: Secondary | ICD-10-CM

## 2019-10-14 DIAGNOSIS — K0820 Unspecified atrophy of edentulous alveolar ridge: Secondary | ICD-10-CM

## 2019-10-14 DIAGNOSIS — Z952 Presence of prosthetic heart valve: Secondary | ICD-10-CM

## 2019-10-14 NOTE — Patient Instructions (Signed)
PLAN: 1.  Continue salt water rinses as needed to aid healing. 2.  Continue to advance diet as tolerated.  Patient to primarily use a softer diet until dentures have been fabricated. 3.  Patient to follow-up with the dentist of his choice for fabrication of upper complete and lower partial dentures.  We discussed following up with [facility], [facility], [facility], a general dentist, or a prosthodontist.  We discussed possible implant therapy.  Patient indicates that he is considering following up with [facility] at this time.  Patient is aware of the decreased prognosis for successful upper complete denture fabrication due to the significant atrophy.  Patient expresses understanding. 4.  Patient is aware of the need for antibiotic premedication prior to invasive dental procedures due to the recent heart valve replacement according to American Heart Association guidelines.   Lenn Cal, DDS

## 2019-10-14 NOTE — Progress Notes (Signed)
PROGRESS NOTE:  10/14/2019 TUDOR CHANDLEY [id number]  COVID 19 SCREENING: The patient does not symptoms concerning for COVID-19 infection (Including fever, chills, cough, or new SHORTNESS OF BREATH). Patient has not had any Covid vaccination to date.   VITALS: BP 135/63 (BP Location: Right Arm)   Pulse 75   Temp 98.3 F (36.8 C)   LABS:  Lab Results  Component Value Date   WBC 9.6 09/18/2019   HGB 11.5 (L) 09/18/2019   HCT 35.2 (L) 09/18/2019   MCV 90.5 09/18/2019   PLT 128 (L) 09/18/2019   BMET    Component Value Date/Time   NA 136 09/18/2019 0241   NA 140 07/28/2019 1508   K 4.1 09/18/2019 0241   CL 105 09/18/2019 0241   CO2 21 (L) 09/18/2019 0241   GLUCOSE 103 (H) 09/18/2019 0241   BUN 13 09/18/2019 0241   BUN 20 07/28/2019 1508   CREATININE 1.02 09/18/2019 0241   CALCIUM 8.2 (L) 09/18/2019 0241   GFRNONAA >60 09/18/2019 0241   GFRAA >60 09/18/2019 0241    Lab Results  Component Value Date   INR 1.3 (H) 09/19/2019   INR 1.3 (H) 09/16/2019   INR 1.2 09/04/2019   No results found for: PTT   [name] is status post extraction of multiple teeth with alveoloplasty in the operating room with general anesthesia on 09/04/19.  Patient was seen for postoperative dental evaluation on 09/15/2019.  Patient then had TAVR procedure on 09/16/2019.The patient now presents for reevaluation of healing.  SUBJECTIVE: Patient without problems associated with dental extraction sites.  Patient has been using a soft diet.  EXAM: There is no sign of infection, heme, or ooze.  The patient has healed and well from dental extractions.  There is atrophy of the edentulous alveolar ridges.   Radiology review: Orthopantogram taken today.  Patient has multiple missing teeth.  Patient has an edentulous maxilla.  Patient has moderate bone loss.  Patient has tooth numbers 21 through 28 remaining.  There is evidence of healing dental extraction sites.  ASSESSMENT: Post operative course is  consistent with dental procedures performed in the operating room with general anesthesia Loss of teeth due to extraction History of questionable delayed healing -resolved  Significant atrophy of the edentulous alveolar ridges.  PLAN: 1.  Continue salt water rinses as needed to aid healing. 2.  Continue to advance diet as tolerated.  Patient to primarily use a softer diet until dentures have been fabricated. 3.  Patient to follow-up with the dentist of his choice for fabrication of upper complete and lower partial dentures.  We discussed following up with [facility], [facility], [facility], a general dentist, or a prosthodontist.  We discussed possible implant therapy.  Patient indicates that he is considering following up with [facility] at this time.  Patient is aware of the decreased prognosis for successful upper complete denture fabrication due to the significant atrophy.  Patient expresses understanding. 4.  Patient is aware of the need for antibiotic premedication prior to invasive dental procedures due to the recent heart valve replacement according to American Heart Association guidelines.  Lenn Cal, DDS

## 2019-10-20 DIAGNOSIS — I4891 Unspecified atrial fibrillation: Secondary | ICD-10-CM | POA: Diagnosis not present

## 2019-10-20 DIAGNOSIS — E1140 Type 2 diabetes mellitus with diabetic neuropathy, unspecified: Secondary | ICD-10-CM | POA: Diagnosis not present

## 2019-10-20 DIAGNOSIS — I10 Essential (primary) hypertension: Secondary | ICD-10-CM | POA: Diagnosis not present

## 2019-10-20 DIAGNOSIS — E7800 Pure hypercholesterolemia, unspecified: Secondary | ICD-10-CM | POA: Diagnosis not present

## 2019-10-20 DIAGNOSIS — M1711 Unilateral primary osteoarthritis, right knee: Secondary | ICD-10-CM | POA: Diagnosis not present

## 2019-10-21 NOTE — Progress Notes (Signed)
[facility]                                       Cardiology Office Note    Date:  10/23/2019   ID:  Gregory [name], DOB 17-Feb-1941, MRN [id number]  PCP:  Kathyrn Lass, MD  Cardiologist: Larae Grooms, MD / Dr. Burt Knack & Dr. Cyndia Bent (TAVR)  CC: 1 month s/p TAVR  History of Present Illness:  Gregory [name] is a 79 y.o. male with a history of persistant atrial fibrillation on coumadin, RBBB,chronic diastolic CHF, mitral regurgitation, Non Hodgkins lymphoma, DM, HTN, severe MAC with mod MS/MR and severe aortic stenosis s/p TAVR (09/16/19) c/b CHB s/p PPM (09/17/19) who presents to clinic for follow up.   Most recent echo April 2021 with LVEF=55%, mild MR, mild to moderate mitral stenosis and severe aortic stenosis with mean gradient 44 mmHg, peak gradient 74 mmHg, AVA 0.69 cm2, dimensionless index 0.17. Cardiac cath May 2021 with no significant CAD. He underwent dental extractions on 09/04/19.  He was evaluated by the multidisciplinary valve team and underwent successful TAVR with a 29 mm Edwards Sapien 3 THV via the TF approach on 09/16/19. Post operative echo showed EF 55-60%, normally functioning TAVR with a mean gradient of 11.1 mm Hg and trivial perivalvular leak at the posterior aspect of the aortic prosthesis. Post TAVR he developed CHB that never resolved. His temp wire was left in place and he underwent PPM with a Medtronic Azure XT DR MRI SureScan single-chamber pacemaker on 09/17/19 by Dr. Curt Bears. Of note, the pt had left sided subclavian occlusion and required PM implantation on the right side. He was resumed on coumadin with the addition of a baby aspirin which will be continued x 31month.  At follow up he complained of fatigue and visual disturbance caused by aspirin and this was discontinued. He also had some mild swelling for which I asked him to take PRN lasix.   Today he presents to clinic for follow up. LE edema resolved with  one dose of lasix. Caused lower back pain when he took the lasix so he hasn't take anymore. Breathing is a lot better since TAVR. No CP or SOB. No LE edema, orthopnea or PND. No dizziness or syncope. No blood in stool or urine. No palpitations. Activity is mostly limited by knee pain given advanced OA. He thinks he will need a knee surgery.     Past Medical History:  Diagnosis Date  . 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 Arthritis   . Chronic atrial fibrillation ([place]   . Chronic diastolic CHF (congestive heart failure) ([place]   . Diabetes mellitus type 2, noninsulin dependent ([place]   . History of blood transfusion    5 pints for peptic ulcer  . History of kidney stones    Passed x 2  . Hypertension   . Mitral valve disease    mild-moderate MR, moderate MS 07/15/19 echo  . 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 Ruptured disk   . S/P TAVR (transcatheter aortic valve replacement) 09/16/2019   s/p TAVR with a 29 mm Edwards Sapien 3 THV via the TF approach by Dr. CBurt Knackand Dr. BCyndia Bent . Severe aortic stenosis   . Ulcer  peptic Ulcer - bleeding    Past Surgical History:  Procedure Laterality Date  . APPENDECTOMY  1967  . COLONOSCOPY    . left knee surgery    . left shoulder surgery    . MULTIPLE EXTRACTIONS WITH ALVEOLOPLASTY N/A 09/04/2019   Procedure: Extraction of tooth #'s 2,7,8,9,11,13,14 20,30, and 31 with alveoloplatsty and gross debridement of remaining teeth;  Surgeon: Lenn Cal, DDS;  Location: [facility];  Service: Oral Surgery;  Laterality: N/A;  . PACEMAKER IMPLANT N/A 09/17/2019   Procedure: PACEMAKER IMPLANT;  Surgeon: Constance Haw, MD;  Location: [facility];  Service: Cardiovascular;  Laterality: N/A;  . RIGHT/LEFT HEART CATH AND CORONARY ANGIOGRAPHY N/A 08/12/2019   Procedure: RIGHT/LEFT HEART CATH AND CORONARY ANGIOGRAPHY;  Surgeon: Jettie Booze,  MD;  Location: [facility];  Service: Cardiovascular;  Laterality: N/A;  . TEE WITHOUT CARDIOVERSION N/A 09/16/2019   Procedure: TRANSESOPHAGEAL ECHOCARDIOGRAM (TEE);  Surgeon: Sherren Mocha, MD;  Location: [facility];  Service: Open Heart Surgery;  Laterality: N/A;  . TONSILLECTOMY    . TRANSCATHETER AORTIC VALVE REPLACEMENT, TRANSFEMORAL N/A 09/16/2019   Procedure: TRANSCATHETER AORTIC VALVE REPLACEMENT, TRANSFEMORAL;  Surgeon: Sherren Mocha, MD;  Location: [facility];  Service: Open Heart Surgery;  Laterality: N/A;    Current Medications: Outpatient Medications Prior to Visit  Medication Sig Dispense Refill  . acetaminophen (TYLENOL) 650 MG CR tablet Take 1,300 mg by mouth every 8 (eight) hours as needed for pain.    Marland Kitchen amoxicillin (AMOXIL) 500 MG capsule Take 4 tablets by mouth 60 minutes prior to any dental procedures 12 capsule 3  . atorvastatin (LIPITOR) 20 MG tablet Take 20 mg by mouth daily at 6 PM.     . diltiazem (CARDIZEM CD) 120 MG 24 hr capsule Take 120 mg by mouth daily at 6 PM.    . furosemide (LASIX) 40 MG tablet Take 1 tablet (40 mg total) by mouth daily as needed. For swelling 30 tablet 3  . lisinopril (PRINIVIL,ZESTRIL) 10 MG tablet Take 10 mg by mouth daily.    . metFORMIN (GLUCOPHAGE-XR) 500 MG 24 hr tablet Take 1 tablet (500 mg total) by mouth 2 (two) times daily.    . metoprolol succinate (TOPROL-XL) 100 MG 24 hr tablet Take 100 mg by mouth daily. Take with or immediately following a meal.    . Multiple Vitamins-Minerals (PRESERVISION AREDS 2 PO) Take 1 tablet by mouth daily.    . ONE TOUCH ULTRA TEST test strip 1 each by Other route as needed (BLOOD SUGAR).     Glory Rosebush DELICA LANCETS 01S MISC Apply 1 each topically daily as needed (BLOOD SUGAR).     Marland Kitchen pioglitazone (ACTOS) 15 MG tablet Take 15 mg by mouth daily.     Marland Kitchen warfarin (COUMADIN) 6 MG tablet Take 3-6 mg by mouth See admin instructions. Take 0.5 tablet (3 mg) by mouth on Thursdays &  Saturdays & take 1 tablet (6 mg) by mouth on Sundays, Mondays, Tuesdays, Wednesdays, & Fridays at night.     No facility-administered medications prior to visit.     Allergies:   Aspirin, Macrobid [nitrofurantoin], Nystatin, Prevnar 13 [pneumococcal 13-val conj vacc], Bisoprolol-hydrochlorothiazide, and Nsaids   Social History   Socioeconomic History  . Marital status: Single    Spouse name: Not on file  . Number of children: 0  . Years of education: Not on file  . Highest education level: Not on file  Occupational History  .  Occupation: Retired-Trucking  Tobacco Use  . Smoking status: Never Smoker  . Smokeless tobacco: Never Used  Vaping Use  . Vaping Use: Never used  Substance and Sexual Activity  . Alcohol use: No  . Drug use: No  . Sexual activity: Never  Other Topics Concern  . Not on file  Social History Narrative  . Not on file   Social Determinants of Health   Financial Resource Strain:   . Difficulty of Paying Living Expenses:   Food Insecurity:   . Worried About Charity fundraiser in the Last Year:   . Arboriculturist in the Last Year:   Transportation Needs:   . Film/video editor (Medical):   Marland Kitchen Lack of Transportation (Non-Medical):   Physical Activity:   . Days of Exercise per Week:   . Minutes of Exercise per Session:   Stress:   . Feeling of Stress :   Social Connections:   . Frequency of Communication with Friends and Family:   . Frequency of Social Gatherings with Friends and Family:   . Attends Religious Services:   . Active Member of Clubs or Organizations:   . Attends Archivist Meetings:   Marland Kitchen Marital Status:      Family History:  The patient's family history includes Cancer in his mother; Heart attack in his father and mother; Hypertension in his father.     ROS:   Please see the history of present illness.    ROS All other systems reviewed and are negative.   PHYSICAL EXAM:   VS:  BP 132/78   Pulse 62   Ht _0   (1.778 m)   Wt 249 lb 12.8 oz (113.3 kg)   SpO2 94%   BMI 35.84 kg/m    GEN: Well nourished, well developed, in no acute distress, obese HEENT: normal Neck: no JVD or masses Cardiac: irreg irreg; soft flow murmur. No rubs, or gallops. Respiratory:  clear to auscultation bilaterally, normal work of breathing GI: soft, nontender, nondistended, + BS MS: no deformity or atrophy Skin: warm and dry, no rash.  Neuro:  Alert and Oriented x 3, Strength and sensation are intact Psych: euthymic mood, full affect   Wt Readings from Last 3 Encounters:  10/22/19 249 lb 12.8 oz (113.3 kg)  10/02/19 248 lb 12.8 oz (112.9 kg)  09/19/19 255 lb (115.7 kg)      Studies/Labs Reviewed:   EKG:  EKG is NOT ordered today  Recent Labs: 09/12/2019: ALT 24; B Natriuretic Peptide 295.1 09/17/2019: Magnesium 1.6 09/18/2019: BUN 13; Creatinine, Ser 1.02; Hemoglobin 11.5; Platelets 128; Potassium 4.1; Sodium 136   Lipid Panel No results found for: CHOL, TRIG, HDL, CHOLHDL, VLDL, LDLCALC, LDLDIRECT  Additional studies/ records that were reviewed today include:  TAVR OPERATIVE NOTE   Date of Procedure:09/16/2019  Preoperative Diagnosis:Severe Aortic Stenosis   Postoperative Diagnosis:Same   Procedure:   Transcatheter Aortic Valve Replacement - PercutaneousRightTransfemoral Approach Edwards Sapien 3 THV (size 85m, model # 9600TFX, serial ##[id number]  Co-Surgeons:Bryan KAlveria Apley MD and MSherren Mocha MD    Anesthesiologist:Carswell JGlennon Mac MD  Echocardiographer:M. Croitoru, MD  Pre-operative Echo Findings: ? Severe aortic stenosis ? Normalleft ventricular systolic function  Post-operative Echo Findings: ? Mildparavalvular leak ? Normalleft ventricular systolic function  _____________   Echo 09/16/19 IMPRESSIONS   1. Ventricular bigeminy present.  2. Left  ventricular ejection fraction, by estimation, is 55 to 60%. The left ventricle has normal function. The left ventricle has no regional  wall motion abnormalities. Left ventricular diastolic function could not be evaluated.  3. Right ventricular systolic function is low normal. The right ventricular size is mildly enlarged. There is mildly elevated pulmonary artery systolic pressure. The estimated right ventricular systolic pressure is 54.6 mmHg.  4. Left atrial size was severely dilated.  5. Right atrial size was mild to moderately dilated.  6. The mitral valve is degenerative. Mild to moderate mitral valve regurgitation. Moderate to severe mitral stenosis. The mean mitral valve gradient is 8.3 mmHg with average heart rate of 33 bpm.  7. 29 mm S3. V max 2.0 m/s, MG 8 mmHG, EOA 2.03 cm2, DI 0.41. No evidence of paravalvular leak. Difficult assessment due to ventricular bigeminy. The aortic valve has been repaired/replaced. Aortic valve regurgitation is not visualized. There is a 29 mm  Edwards Sapien prosthetic (TAVR) valve present in the aortic position. Procedure Date: 09/16/2019. Echo findings are consistent with normal structure and function of the aortic valve prosthesis.  8. The inferior vena cava is normal in size with greater than 50% respiratory variability, suggesting right atrial pressure of 3 mmHg.  Comparison(s): No significant change from prior study. TAVR is stable. 1. Left ventricular ejection fraction, by estimation, is 55 to 60%. The  left ventricle has normal function. The left ventricle has no regional  wall motion abnormalities. There is mild concentric left ventricular  hypertrophy. Left ventricular diastolic function could not be evaluated.  2. Right ventricular systolic function is normal. The right ventricular  size is normal. There is moderately elevated pulmonary artery systolic  pressure. The estimated right ventricular systolic pressure is 50.3 mmHg.  3. Left atrial  size was mild to moderately dilated.  4. The mitral valve is degenerative. Moderate mitral valve regurgitation.  Moderate mitral stenosis. The mean mitral valve gradient is 7.0 mmHg with average heart rate of 60 bpm.  5. The aortic valve has been repaired/replaced. Aortic valve  regurgitation is trivial. There is a 29 mm Edwards Sapien prosthetic  (TAVR) valve present in the aortic position. Procedure Date: 09/16/2019.  Trivial perivalvular leak at the posterior aspect  of the aortic prosthesis.  6. The inferior vena cava is dilated in size with <50% respiratory  variability, suggesting right atrial pressure of 15 mmHg.  Comparison(s): No significant change from prior study. Prior images  reviewed side by side.   _______________   09/17/19 PACEMAKER IMPLANT  Conclusion SURGEON:  Will Meredith Leeds, MD     PREPROCEDURE DIAGNOSIS:  Complete heart block    POSTPROCEDURE DIAGNOSIS:  Complete heart block     PROCEDURES:   1. Left and right upper extremity venography.   2. Pacemaker implantation.   __________________  Echo 10/22/19 IMPRESSIONS  1. Ventricular bigeminy present.  2. Left ventricular ejection fraction, by estimation, is 55 to 60%. The  left ventricle has normal function. The left ventricle has no regional  wall motion abnormalities. Left ventricular diastolic function could not  be evaluated.  3. Right ventricular systolic function is low normal. The right  ventricular size is mildly enlarged. There is mildly elevated pulmonary  artery systolic pressure. The estimated right ventricular systolic  pressure is 54.6 mmHg.  4. Left atrial size was severely dilated.  5. Right atrial size was mild to moderately dilated.  6. The mitral valve is degenerative. Mild to moderate mitral valve  regurgitation. Moderate to severe mitral stenosis. The mean mitral valve  gradient is 8.3 mmHg with average heart rate of 33 bpm.  7. 29 mm  S3. V max 2.0 m/s, MG 8 mmHG, EOA  2.03 cm2, DI 0.41. No evidence  of paravalvular leak. Difficult assessment due to ventricular bigeminy.  The aortic valve has been repaired/replaced. Aortic valve regurgitation is  not visualized. There is a 29 mm Edwards Sapien prosthetic (TAVR) valve present in the aortic position. Procedure Date: 09/16/2019. Echo findings are consistent with normal structure and function of the aortic valve prosthesis.  8. The inferior vena cava is normal in size with greater than 50%  respiratory variability, suggesting right atrial pressure of 3 mmHg.   Comparison(s): No significant change from prior study. TAVR is stable.   ASSESSMENT & PLAN:   Severe AS s/p TAVR:Echo today shows EF 55%, normally functioning TAVR with a mean gradient of 8 mmHg and no PVL. Continue on Coumadin alone due to intolerance to aspirin.  SBE prophylaxis discussed; I have RX'd amoxicillin. He has NYHA class I symptoms, but mostly limited by knee pain due to advanced OA. I will see him back in 1 year with echo.   CHB s/p PPM: followed by Dr. Curt Bears.  Persistent afib: rate well controlled. Coumadin followed by PCP.   Chronic diastolic CHF: appears euvolemic. Continue lasix 84m PRN   Right knee pain: walking with a crutch today due to advanced OA in his knee. Okay to get knee done but will need to wait at least 3 months out from TAVR (okay after 12/17/19).   PVCs: noted to be in bigeminy throughout echo. Continue Toprol XL 1051mdaily  Mitral valve disease: echo today shows mild-mod MR and mod to severe MS.  Incidental findings: pre TAVR CT showed Low-attenuation 1.3 cm focus in the pancreatic body. No biliary or pancreatic duct dilation. MRI abdomen without and with IV contrast recommended for further characterization. Recently had pacemaker placed which is MRI compatable but he must wait.6 weeks after placement to get MRI (after 7/28.) He would like to discuss with his PCP, Dr. MiSabra HeckI will fax her the CT and my office  note from today  Medication Adjustments/Labs and Tests Ordered: Current medicines are reviewed at length with the patient today.  Concerns regarding medicines are outlined above.  Medication changes, Labs and Tests ordered today are listed in the Patient Instructions below. Patient Instructions  Medication Instructions:  No changes *If you need a refill on your cardiac medications before your next appointment, please call your pharmacy*   Lab Work: none If you have labs (blood work) drawn today and your tests are completely normal, you will receive your results only by: . Marland KitchenyChart Message (if you have MyChart) OR . A paper copy in the mail If you have any lab test that is abnormal or we need to change your treatment, we will call you to review the results.   Testing/Procedures: Echo in one year.   Follow-Up: [facility].  As part of our continuing mission to provide you with exceptional heart care, we have created designated Provider Care Teams.  These Care Teams include your primary Cardiologist (physician) and Advanced Practice Providers (APPs -  Physician Assistants and Nurse Practitioners) who all work together to provide you with the care you need, when you need it.  We recommend signing up for the patient portal called "MyChart".  Sign up information is provided on this After Visit Summary.  MyChart is used to connect with patients for Virtual Visits (Telemedicine).  Patients are able to view lab/test results, encounter  notes, upcoming appointments, etc.  Non-urgent messages can be sent to your provider as well.   To learn more about what you can do with MyChart, go to [email or web address].    Your next appointment:   3-4 month(s)  The format for your next appointment:   In Person  Provider:   You may see Larae Grooms, MD or one of the following Advanced Practice Providers on your designated Care Team:    Melina Copa,  PA-C  Ermalinda Barrios, PA-C    Other Instructions Your next appointment with Nell Range is scheduled for  October 20, 2020.       Signed, Angelena Form, PA-C  10/23/2019 9:46 AM    [facility] [address], [place], [place]  [address] Phone: [phone]; Fax: [phone]

## 2019-10-22 ENCOUNTER — Ambulatory Visit: Payer: PPO | Admitting: Physician Assistant

## 2019-10-22 ENCOUNTER — Ambulatory Visit (HOSPITAL_COMMUNITY): Payer: PPO | Attending: Cardiovascular Disease

## 2019-10-22 ENCOUNTER — Encounter: Payer: Self-pay | Admitting: Physician Assistant

## 2019-10-22 ENCOUNTER — Other Ambulatory Visit: Payer: Self-pay

## 2019-10-22 VITALS — BP 132/78 | HR 62 | Ht 70.0 in | Wt 249.8 lb

## 2019-10-22 DIAGNOSIS — I059 Rheumatic mitral valve disease, unspecified: Secondary | ICD-10-CM | POA: Diagnosis not present

## 2019-10-22 DIAGNOSIS — Z950 Presence of cardiac pacemaker: Secondary | ICD-10-CM

## 2019-10-22 DIAGNOSIS — Z952 Presence of prosthetic heart valve: Secondary | ICD-10-CM | POA: Insufficient documentation

## 2019-10-22 DIAGNOSIS — I5032 Chronic diastolic (congestive) heart failure: Secondary | ICD-10-CM

## 2019-10-22 DIAGNOSIS — I4819 Other persistent atrial fibrillation: Secondary | ICD-10-CM | POA: Diagnosis not present

## 2019-10-22 DIAGNOSIS — K869 Disease of pancreas, unspecified: Secondary | ICD-10-CM | POA: Diagnosis not present

## 2019-10-22 DIAGNOSIS — I493 Ventricular premature depolarization: Secondary | ICD-10-CM | POA: Diagnosis not present

## 2019-10-22 LAB — ECHOCARDIOGRAM COMPLETE
AR max vel: 1.89 cm2
AV Area VTI: 2.03 cm2
AV Area mean vel: 1.89 cm2
AV Mean grad: 8 mmHg
AV Peak grad: 15.7 mmHg
Ao pk vel: 1.98 m/s
Area-P 1/2: 2.35 cm2
MV VTI: 1.58 cm2
S' Lateral: 3.5 cm

## 2019-10-22 NOTE — Patient Instructions (Addendum)
Medication Instructions:  No changes *If you need a refill on your cardiac medications before your next appointment, please call your pharmacy*   Lab Work: none If you have labs (blood work) drawn today and your tests are completely normal, you will receive your results only by: Marland Kitchen MyChart Message (if you have MyChart) OR . A paper copy in the mail If you have any lab test that is abnormal or we need to change your treatment, we will call you to review the results.   Testing/Procedures: Echo in one year.   Follow-Up: At [facility], you and your health needs are our priority.  As part of our continuing mission to provide you with exceptional heart care, we have created designated Provider Care Teams.  These Care Teams include your primary Cardiologist (physician) and Advanced Practice Providers (APPs -  Physician Assistants and Nurse Practitioners) who all work together to provide you with the care you need, when you need it.  We recommend signing up for the patient portal called "MyChart".  Sign up information is provided on this After Visit Summary.  MyChart is used to connect with patients for Virtual Visits (Telemedicine).  Patients are able to view lab/test results, encounter notes, upcoming appointments, etc.  Non-urgent messages can be sent to your provider as well.   To learn more about what you can do with MyChart, go to [email or web address].    Your next appointment:   3-4 month(s)  The format for your next appointment:   In Person  Provider:   You may see Larae Grooms, MD or one of the following Advanced Practice Providers on your designated Care Team:    Melina Copa, PA-C  Ermalinda Barrios, PA-C    Other Instructions Your next appointment with Nell Range is scheduled for  October 20, 2020.

## 2019-10-23 ENCOUNTER — Other Ambulatory Visit: Payer: Self-pay | Admitting: Physician Assistant

## 2019-10-24 ENCOUNTER — Telehealth (HOSPITAL_COMMUNITY): Payer: Self-pay

## 2019-10-24 NOTE — Telephone Encounter (Signed)
No response from pt regarding CR.  Closed referral.  

## 2019-10-27 DIAGNOSIS — Z7901 Long term (current) use of anticoagulants: Secondary | ICD-10-CM | POA: Diagnosis not present

## 2019-10-27 DIAGNOSIS — I4891 Unspecified atrial fibrillation: Secondary | ICD-10-CM | POA: Diagnosis not present

## 2019-10-30 ENCOUNTER — Telehealth: Payer: Self-pay

## 2019-10-30 NOTE — Telephone Encounter (Signed)
The pt calling for [employer]

## 2019-10-30 NOTE — Telephone Encounter (Signed)
The pt called in regards to recommendation for Abd MRI.  The pt did not get to speak with PCP, Dr Sabra Heck, in regards to getting this test arranged. He contacted me to see if Gregory [name] would help with getting this test arranged.  I made the pt aware that I am happy to help with arranging MRI. The pt is scheduled to see the dental clinic next Wednesday and he would like to get his teeth taken care of before doing additional testing.  I instructed the pt to contact me once he has completed dental treatment and then I will arrange MRI abdomen. Pt agreed with plan.    Per 10/22/19 OV note with Katie:  Incidental findings: pre TAVR CT showed Low-attenuation 1.3 cm focus in the pancreatic body. No biliary or pancreatic duct dilation. MRI abdomen without and with IV contrast recommended for further characterization. Recently had pacemaker placed which is MRI compatable but he must wait.6 weeks after placement to get MRI (after 7/28.) He would like to discuss with his PCP, Dr. Sabra Heck. I will fax her the CT and my office note from today

## 2019-10-30 NOTE — Telephone Encounter (Signed)
THank you! I can get it set up tomorrow

## 2019-12-08 ENCOUNTER — Other Ambulatory Visit: Payer: Self-pay

## 2019-12-08 DIAGNOSIS — K869 Disease of pancreas, unspecified: Secondary | ICD-10-CM

## 2019-12-08 DIAGNOSIS — R9389 Abnormal findings on diagnostic imaging of other specified body structures: Secondary | ICD-10-CM

## 2019-12-08 DIAGNOSIS — K862 Cyst of pancreas: Secondary | ICD-10-CM

## 2019-12-09 ENCOUNTER — Other Ambulatory Visit: Payer: Self-pay | Admitting: Physician Assistant

## 2019-12-09 DIAGNOSIS — I5033 Acute on chronic diastolic (congestive) heart failure: Secondary | ICD-10-CM

## 2019-12-11 ENCOUNTER — Other Ambulatory Visit: Payer: PPO

## 2019-12-11 ENCOUNTER — Telehealth: Payer: Self-pay | Admitting: Physician Assistant

## 2019-12-11 DIAGNOSIS — I10 Essential (primary) hypertension: Secondary | ICD-10-CM | POA: Diagnosis not present

## 2019-12-11 DIAGNOSIS — E7800 Pure hypercholesterolemia, unspecified: Secondary | ICD-10-CM | POA: Diagnosis not present

## 2019-12-11 DIAGNOSIS — Z6837 Body mass index (BMI) 37.0-37.9, adult: Secondary | ICD-10-CM | POA: Diagnosis not present

## 2019-12-11 DIAGNOSIS — D649 Anemia, unspecified: Secondary | ICD-10-CM | POA: Diagnosis not present

## 2019-12-11 DIAGNOSIS — E1140 Type 2 diabetes mellitus with diabetic neuropathy, unspecified: Secondary | ICD-10-CM | POA: Diagnosis not present

## 2019-12-11 DIAGNOSIS — Z7901 Long term (current) use of anticoagulants: Secondary | ICD-10-CM | POA: Diagnosis not present

## 2019-12-11 DIAGNOSIS — Z23 Encounter for immunization: Secondary | ICD-10-CM | POA: Diagnosis not present

## 2019-12-11 DIAGNOSIS — I4891 Unspecified atrial fibrillation: Secondary | ICD-10-CM | POA: Diagnosis not present

## 2019-12-11 NOTE — Telephone Encounter (Signed)
Left message to call back  

## 2019-12-11 NOTE — Telephone Encounter (Signed)
I have a Manuela Schwartz from Dr. Irma Newness office looking to speak with you regarding the patient. He's a patient of Nell Range. Please call [phone] - direct line, or [phone] - to page

## 2019-12-11 NOTE — Telephone Encounter (Signed)
Confirmed with patient he is only to hold lisinopril the morning of his MRI 9/21, not every day. Instructed him not to take Lasix the morning of his MRI either (this is a PRN med anyway).  He was grateful for call and agrees with plan.

## 2019-12-11 NOTE — Telephone Encounter (Signed)
Manuela Schwartz, Dr. Ammie Ferrier nurse, reports the patient was called and told to stop lisinopril. They are trying to figure out if Cardiology did in fact stop the medication and why. Reviewed chart with Manuela Schwartz. No documentation found instructing the patient to stop lisinopril and it is still in his active medication list. Dr. Sabra Heck will tell him to restart the medication. Manuela Schwartz also states BMET will be drawn at their office so he does not have to come to [place].  Manuela Schwartz was grateful for assistance.  Spoke with Nell Range, PA, who states she did not stop the medication but instructed him to hold it and Lasix the morning of the MRI only. Will call patient and confirm with him later once he is home.

## 2019-12-11 NOTE — Telephone Encounter (Signed)
Gregory [name] from [facility] returning call.

## 2019-12-18 ENCOUNTER — Ambulatory Visit (INDEPENDENT_AMBULATORY_CARE_PROVIDER_SITE_OTHER): Payer: PPO | Admitting: *Deleted

## 2019-12-18 DIAGNOSIS — I442 Atrioventricular block, complete: Secondary | ICD-10-CM

## 2019-12-18 LAB — CUP PACEART REMOTE DEVICE CHECK
Battery Remaining Longevity: 126 mo
Battery Voltage: 3.09 V
Brady Statistic RV Percent Paced: 88.22 %
Date Time Interrogation Session: 20210915212525
Implantable Lead Implant Date: 20210616
Implantable Lead Location: 753860
Implantable Lead Model: 5076
Implantable Pulse Generator Implant Date: 20210616
Lead Channel Impedance Value: 456 Ohm
Lead Channel Impedance Value: 532 Ohm
Lead Channel Pacing Threshold Amplitude: 0.5 V
Lead Channel Pacing Threshold Pulse Width: 0.4 ms
Lead Channel Sensing Intrinsic Amplitude: 8.125 mV
Lead Channel Sensing Intrinsic Amplitude: 8.125 mV
Lead Channel Setting Pacing Amplitude: 3.25 V
Lead Channel Setting Pacing Pulse Width: 0.4 ms
Lead Channel Setting Sensing Sensitivity: 4 mV

## 2019-12-22 NOTE — Progress Notes (Signed)
Remote pacemaker transmission.   

## 2019-12-23 ENCOUNTER — Encounter (HOSPITAL_COMMUNITY): Payer: Self-pay | Admitting: *Deleted

## 2019-12-23 ENCOUNTER — Ambulatory Visit (HOSPITAL_COMMUNITY)
Admission: RE | Admit: 2019-12-23 | Discharge: 2019-12-23 | Disposition: A | Discharge status: Home / Self Care | Payer: PPO | Source: Ambulatory Visit | Attending: Physician Assistant | Admitting: Physician Assistant

## 2019-12-23 ENCOUNTER — Other Ambulatory Visit: Payer: Self-pay

## 2019-12-23 DIAGNOSIS — R9389 Abnormal findings on diagnostic imaging of other specified body structures: Secondary | ICD-10-CM

## 2019-12-23 DIAGNOSIS — M47816 Spondylosis without myelopathy or radiculopathy, lumbar region: Secondary | ICD-10-CM | POA: Diagnosis not present

## 2019-12-23 DIAGNOSIS — K8689 Other specified diseases of pancreas: Secondary | ICD-10-CM | POA: Diagnosis not present

## 2019-12-23 DIAGNOSIS — K862 Cyst of pancreas: Secondary | ICD-10-CM

## 2019-12-23 DIAGNOSIS — N281 Cyst of kidney, acquired: Secondary | ICD-10-CM | POA: Diagnosis not present

## 2019-12-23 DIAGNOSIS — K869 Disease of pancreas, unspecified: Secondary | ICD-10-CM

## 2019-12-23 DIAGNOSIS — I700 Atherosclerosis of aorta: Secondary | ICD-10-CM | POA: Diagnosis not present

## 2019-12-23 MED ORDER — GADOBUTROL 1 MMOL/ML IV SOLN
10.0000 mL | Freq: Once | INTRAVENOUS | Status: AC | PRN
  Administered 2019-12-23: 10 mL via INTRAVENOUS

## 2019-12-23 NOTE — Progress Notes (Signed)
Patient here for MRI of abdomen with contrast. Patient does have medtronic pacer. [facility] express sent. Orders received for VOO rate of 90

## 2019-12-23 NOTE — Progress Notes (Signed)
Informed of MRI for today.   Device system confirmed to be MRI conditional, with implant date > 6 weeks ago and no evidence of abandoned or epicardial leads in review of most recent CXR Interrogation from today reviewed, pt is currently VP at ~76 bpm Change device settings for MRI to VOO at 90 bpm  Tachy-therapies to off if applicable.  Program device back to pre-MRI settings after completion of exam.  Annamaria Helling  12/23/2019 12:57 PM

## 2019-12-25 ENCOUNTER — Ambulatory Visit: Payer: PPO | Admitting: Cardiology

## 2019-12-25 ENCOUNTER — Other Ambulatory Visit: Payer: Self-pay

## 2019-12-25 ENCOUNTER — Encounter: Payer: Self-pay | Admitting: Cardiology

## 2019-12-25 VITALS — BP 124/60 | HR 64 | Ht 70.0 in | Wt 252.0 lb

## 2019-12-25 DIAGNOSIS — I442 Atrioventricular block, complete: Secondary | ICD-10-CM

## 2019-12-25 NOTE — Progress Notes (Signed)
Electrophysiology Office Note   Date:  12/25/2019   ID:  Gregory [name], DOB February 22, 1941, MRN [id number]  PCP:  Kathyrn Lass, MD  Cardiologist:  Irish Lack Primary Electrophysiologist:  Melquisedec Journey Meredith Leeds, MD    Chief Complaint: complete heart block   History of Present Illness: Gregory [name] is a 79 y.o. male who is being seen today for the evaluation of complete heart block at the request of Kathyrn Lass, MD. Presenting today for electrophysiology evaluation.  He has a history of permanent atrial fibrillation, chronic diastolic heart failure, mitral regurgitation, non-Hodgkin's lymphoma, diabetes, hypertension, severe MAC with moderate to severe MR, and severe aortic stenosis status post TAVR 09/16/2019.  Post procedure he was noted to have complete heart block and is now status post Medtronic single-chamber pacemaker implanted 09/17/2019.  Today, he denies symptoms of palpitations, chest pain, shortness of breath, orthopnea, PND, lower extremity edema, claudication, dizziness, presyncope, syncope, bleeding, or neurologic sequela. The patient is tolerating medications without difficulties.    Past Medical History:  Diagnosis Date  . 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 Arthritis   . Chronic atrial fibrillation ([place])   . Chronic diastolic CHF (congestive heart failure) ([place])   . Diabetes mellitus type 2, noninsulin dependent ([place])   . History of blood transfusion    5 pints for peptic ulcer  . History of kidney stones    Passed x 2  . Hypertension   . Mitral valve disease    mild-moderate MR, moderate MS 07/15/19 echo  . 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 Ruptured disk   . S/P TAVR (transcatheter aortic valve replacement) 09/16/2019   s/p TAVR with a 29 mm Edwards Sapien 3 THV via the TF approach by Dr. Burt Knack and Dr. Cyndia Bent  . Severe aortic stenosis   . Ulcer    peptic Ulcer -  bleeding   Past Surgical History:  Procedure Laterality Date  . APPENDECTOMY  1967  . COLONOSCOPY    . left knee surgery    . left shoulder surgery    . MULTIPLE EXTRACTIONS WITH ALVEOLOPLASTY N/A 09/04/2019   Procedure: Extraction of tooth #'s 2,7,8,9,11,13,14 20,30, and 31 with alveoloplatsty and gross debridement of remaining teeth;  Surgeon: Lenn Cal, DDS;  Location: [place];  Service: Oral Surgery;  Laterality: N/A;  . PACEMAKER IMPLANT N/A 09/17/2019   Procedure: PACEMAKER IMPLANT;  Surgeon: Constance Haw, MD;  Location: [place] [facility];  Service: Cardiovascular;  Laterality: N/A;  . RIGHT/LEFT HEART CATH AND CORONARY ANGIOGRAPHY N/A 08/12/2019   Procedure: RIGHT/LEFT HEART CATH AND CORONARY ANGIOGRAPHY;  Surgeon: Jettie Booze, MD;  Location: [place] [facility];  Service: Cardiovascular;  Laterality: N/A;  . TEE WITHOUT CARDIOVERSION N/A 09/16/2019   Procedure: TRANSESOPHAGEAL ECHOCARDIOGRAM (TEE);  Surgeon: Sherren Mocha, MD;  Location: [place] [facility];  Service: Open Heart Surgery;  Laterality: N/A;  . TONSILLECTOMY    . TRANSCATHETER AORTIC VALVE REPLACEMENT, TRANSFEMORAL N/A 09/16/2019   Procedure: TRANSCATHETER AORTIC VALVE REPLACEMENT, TRANSFEMORAL;  Surgeon: Sherren Mocha, MD;  Location: [place] [facility];  Service: Open Heart Surgery;  Laterality: N/A;     Current Outpatient Medications  Medication Sig Dispense Refill  . amoxicillin (AMOXIL) 500 MG capsule Take 4 tablets by mouth 60 minutes prior to any dental procedures 12 capsule 3  . atorvastatin (LIPITOR) 20 MG tablet Take 20 mg  by mouth daily at 6 PM.     . cyanocobalamin 1000 MCG tablet Take 2,000 mcg by mouth daily.    Marland Kitchen diltiazem (CARDIZEM CD) 120 MG 24 hr capsule Take 120 mg by mouth daily at 6 PM.    . Ferrous Sulfate (IRON PO) Take 65 mg by mouth daily.    . ferrous sulfate 325 (65 FE) MG tablet Take 325 mg by mouth daily with breakfast.    . furosemide (LASIX) 40 MG tablet Take 1  tablet (40 mg total) by mouth daily as needed. For swelling 30 tablet 3  . lisinopril (PRINIVIL,ZESTRIL) 10 MG tablet Take 10 mg by mouth daily.    . metFORMIN (GLUCOPHAGE-XR) 500 MG 24 hr tablet Take 1 tablet (500 mg total) by mouth 2 (two) times daily.    . metoprolol succinate (TOPROL-XL) 100 MG 24 hr tablet Take 100 mg by mouth daily. Take with or immediately following a meal.    . Multiple Vitamins-Minerals (PRESERVISION AREDS 2 PO) Take 1 tablet by mouth daily.    . ONE TOUCH ULTRA TEST test strip 1 each by Other route as needed (BLOOD SUGAR).     Glory Rosebush DELICA LANCETS 03K MISC Apply 1 each topically daily as needed (BLOOD SUGAR).     Marland Kitchen pioglitazone (ACTOS) 15 MG tablet Take 15 mg by mouth daily.     Marland Kitchen warfarin (COUMADIN) 6 MG tablet Take 3-6 mg by mouth See admin instructions. Take 0.5 tablet (3 mg) by mouth on Thursdays & Saturdays & take 1 tablet (6 mg) by mouth on Sundays, Mondays, Tuesdays, Wednesdays, & Fridays at night.     No current facility-administered medications for this visit.    Allergies:   Aspirin, Macrobid [nitrofurantoin], Nystatin, Prevnar 13 [pneumococcal 13-val conj vacc], Bisoprolol-hydrochlorothiazide, and Nsaids   Social History:  The patient  reports that he has never smoked. He has never used smokeless tobacco. He reports that he does not drink alcohol and does not use drugs.   Family History:  The patient's family history includes Cancer in his mother; Heart attack in his father and mother; Hypertension in his father.    ROS:  Please see the history of present illness.   Otherwise, review of systems is positive for none.   All other systems are reviewed and negative.    PHYSICAL EXAM: VS:  BP 124/60   Pulse 64   Ht _0  (1.778 m)   Wt 252 lb (114.3 kg)   SpO2 98%   BMI 36.16 kg/m  , BMI Body mass index is 36.16 kg/m. GEN: Well nourished, well developed, in no acute distress  HEENT: normal  Neck: no JVD, carotid bruits, or masses Cardiac: RRR;  no murmurs, rubs, or gallops,no edema  Respiratory:  clear to auscultation bilaterally, normal work of breathing GI: soft, nontender, nondistended, + BS MS: no deformity or atrophy  Skin: warm and dry, device pocket is well healed Neuro:  Strength and sensation are intact Psych: euthymic mood, full affect  EKG:  EKG is ordered today. Personal review of the ekg ordered shows atrial fibrillation, ventricular paced  Device interrogation is reviewed today in detail.  See PaceArt for details.   Recent Labs: 09/12/2019: ALT 24; B Natriuretic Peptide 295.1 09/17/2019: Magnesium 1.6 09/18/2019: BUN 13; Creatinine, Ser 1.02; Hemoglobin 11.5; Platelets 128; Potassium 4.1; Sodium 136    Lipid Panel  No results found for: CHOL, TRIG, HDL, CHOLHDL, VLDL, LDLCALC, LDLDIRECT   Wt Readings from Last 3 Encounters:  12/25/19 252 lb (114.3 kg)  10/22/19 249 lb 12.8 oz (113.3 kg)  10/02/19 248 lb 12.8 oz (112.9 kg)      Other studies Reviewed: Additional studies/ records that were reviewed today include: TTE 10/22/19  Review of the above records today demonstrates:  1. Ventricular bigeminy present.  2. Left ventricular ejection fraction, by estimation, is 55 to 60%. The  left ventricle has normal function. The left ventricle has no regional  wall motion abnormalities. Left ventricular diastolic function could not  be evaluated.  3. Right ventricular systolic function is low normal. The right  ventricular size is mildly enlarged. There is mildly elevated pulmonary  artery systolic pressure. The estimated right ventricular systolic  pressure is 86.7 mmHg.  4. Left atrial size was severely dilated.  5. Right atrial size was mild to moderately dilated.  6. The mitral valve is degenerative. Mild to moderate mitral valve  regurgitation. Moderate to severe mitral stenosis. The mean mitral valve  gradient is 8.3 mmHg with average heart rate of 33 bpm.  7. 29 mm S3. V max 2.0 m/s, MG 8 mmHG,  EOA 2.03 cm2, DI 0.41. No evidence  of paravalvular leak. Difficult assessment due to ventricular bigeminy.  The aortic valve has been repaired/replaced. Aortic valve regurgitation is  not visualized. There is a 29 mm  Edwards Sapien prosthetic (TAVR) valve present in the aortic position.  Procedure Date: 09/16/2019. Echo findings are consistent with normal  structure and function of the aortic valve prosthesis.  8. The inferior vena cava is normal in size with greater than 50%  respiratory variability, suggesting right atrial pressure of 3 mmHg.    ASSESSMENT AND PLAN:  1.  Complete heart block: Occurred post TAVR.  He is status post Medtronic single-chamber pacemaker.  He was found to have left-sided subclavian occlusion and thus the pacemaker was implanted on the right side.  Device functioning appropriately.  No changes.  2.  Permanent atrial fibrillation: CHA2DS2-VASc of at least 5.  Currently on Coumadin.  3.  Severe aortic stenosis status post TAVR: Echo with a stable valve.  Plan per primary cardiology.  4.  PVCs: Continue Toprol-XL.  Current medicines are reviewed at length with the patient today.   The patient does not have concerns regarding his medicines.  The following changes were made today:  none  Labs/ tests ordered today include:  Orders Placed This Encounter  Procedures  . EKG 12-Lead     Disposition:   FU with Kahealani Yankovich 9 months  Signed, Remus Hagedorn Meredith Leeds, MD  12/25/2019 2:25 PM     [address] [place] [address] [phone] (office) [phone] (fax)

## 2020-01-08 DIAGNOSIS — Z7901 Long term (current) use of anticoagulants: Secondary | ICD-10-CM | POA: Diagnosis not present

## 2020-01-08 DIAGNOSIS — I4891 Unspecified atrial fibrillation: Secondary | ICD-10-CM | POA: Diagnosis not present

## 2020-01-11 DIAGNOSIS — M1711 Unilateral primary osteoarthritis, right knee: Secondary | ICD-10-CM | POA: Diagnosis not present

## 2020-01-11 DIAGNOSIS — I10 Essential (primary) hypertension: Secondary | ICD-10-CM | POA: Diagnosis not present

## 2020-01-11 DIAGNOSIS — D649 Anemia, unspecified: Secondary | ICD-10-CM | POA: Diagnosis not present

## 2020-01-11 DIAGNOSIS — E7800 Pure hypercholesterolemia, unspecified: Secondary | ICD-10-CM | POA: Diagnosis not present

## 2020-01-11 DIAGNOSIS — E1140 Type 2 diabetes mellitus with diabetic neuropathy, unspecified: Secondary | ICD-10-CM | POA: Diagnosis not present

## 2020-01-11 DIAGNOSIS — I4891 Unspecified atrial fibrillation: Secondary | ICD-10-CM | POA: Diagnosis not present

## 2020-01-21 DIAGNOSIS — Z23 Encounter for immunization: Secondary | ICD-10-CM | POA: Diagnosis not present

## 2020-01-21 DIAGNOSIS — Z1389 Encounter for screening for other disorder: Secondary | ICD-10-CM | POA: Diagnosis not present

## 2020-01-21 DIAGNOSIS — Z0000 Encounter for general adult medical examination without abnormal findings: Secondary | ICD-10-CM | POA: Diagnosis not present

## 2020-02-04 NOTE — Progress Notes (Signed)
Cardiology Office Note   Date:  02/06/2020   ID:  Gregory [name], DOB [birth year], MRN [id number]  PCP:  Gregory Lass, MD    No chief complaint on file.  S/p AVR  Wt Readings from Last 3 Encounters:  02/06/20 258 lb 12.8 oz (117.4 kg)  12/25/19 252 lb (114.3 kg)  10/22/19 249 lb 12.8 oz (113.3 kg)       History of Present Illness: Gregory [name] is a 79 y.o. male  with mitral regurgtation and severe AS.   In 06/24/15, had acute heart failure diastolic,asa diuresed. He is now 40 lbs lighter than he was in 2/17. 20 lbs of fluid diuresed and then 20 lb weight loss.He reduced salt in his diet.  Walking limited by knee pain. Has received knee injections.   His sister in law died from cancerin early 06/24/18. THis has been a source of stress.  05/2018 echo showed severe ZD:GLOVFI stenosis now severe. He should watch for chest discomfort, signs of CHF ( DOE, fluid overload), or lightheadedness/syncope./ WOuld need left and right heart cath and then evaluation for SAVR vs. TAVR based on coronary anatomy.   4/21 echo showed: Normal LV function. Calcification of the aortomitral continuity with severe aortic valve  stenosis and moderate mitral valve stesnosis. Findings suggestive of  radiation heart disease.  6. The mitral valve is abnormal. Mild to moderate mitral valve  regurgitation. Moderate mitral stenosis. The mean mitral valve gradient is 6.0 mmHg with average heart rate of 74 bpm.  7. The aortic valve is abnormal. Aortic valve regurgitation is not  visualized. Severe aortic valve stenosis. Aortic valve mean gradient  measures 44.0 mmHg.  8. Aortic dilatation noted. There is mild dilatation of the ascending  aorta measuring 41 mm.   Since the last visit with me, he had cath showing no severe CAD.  He then had TAVR followed by pacemaker in 06-24-2019.  Pacer on the right side of chest after there was too much scar tissue on the left side.   Walking limited by  right knee pain.  May need right knee surgery.     Past Medical History:  Diagnosis Date  . 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 Arthritis   . Chronic atrial fibrillation ([place])   . Chronic diastolic CHF (congestive heart failure) ([place])   . Diabetes mellitus type 2, noninsulin dependent ([place])   . History of blood transfusion    5 pints for peptic ulcer  . History of kidney stones    Passed x 2  . Hypertension   . Mitral valve disease    mild-moderate MR, moderate MS 07/15/19 echo  . 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 Ruptured disk   . S/P TAVR (transcatheter aortic valve replacement) 09/16/2019   s/p TAVR with a 29 mm Edwards Sapien 3 THV via the TF approach by Dr. Burt Knack and Dr. Cyndia Bent  . Severe aortic stenosis   . Ulcer    peptic Ulcer - bleeding    Past Surgical History:  Procedure Laterality Date  . APPENDECTOMY  1967  . COLONOSCOPY    . left knee surgery    . left shoulder surgery    . MULTIPLE EXTRACTIONS WITH ALVEOLOPLASTY N/A 09/04/2019   Procedure: Extraction of tooth #'s 2,7,8,9,11,13,14 20,30, and 31 with alveoloplatsty and gross debridement of remaining teeth;  Surgeon: Teena Dunk  F, DDS;  Location: [facility];  Service: Oral Surgery;  Laterality: N/A;  . PACEMAKER IMPLANT N/A 09/17/2019   Procedure: PACEMAKER IMPLANT;  Surgeon: Constance Haw, MD;  Location: [place] [facility];  Service: Cardiovascular;  Laterality: N/A;  . RIGHT/LEFT HEART CATH AND CORONARY ANGIOGRAPHY N/A 08/12/2019   Procedure: RIGHT/LEFT HEART CATH AND CORONARY ANGIOGRAPHY;  Surgeon: Jettie Booze, MD;  Location: [place] [facility];  Service: Cardiovascular;  Laterality: N/A;  . TEE WITHOUT CARDIOVERSION N/A 09/16/2019   Procedure: TRANSESOPHAGEAL ECHOCARDIOGRAM (TEE);  Surgeon: Sherren Mocha, MD;  Location: [place] [facility];  Service: Open Heart Surgery;  Laterality: N/A;  .  TONSILLECTOMY    . TRANSCATHETER AORTIC VALVE REPLACEMENT, TRANSFEMORAL N/A 09/16/2019   Procedure: TRANSCATHETER AORTIC VALVE REPLACEMENT, TRANSFEMORAL;  Surgeon: Sherren Mocha, MD;  Location: [place] [facility];  Service: Open Heart Surgery;  Laterality: N/A;     Current Outpatient Medications  Medication Sig Dispense Refill  . amoxicillin (AMOXIL) 500 MG capsule Take 4 tablets by mouth 60 minutes prior to any dental procedures 12 capsule 3  . atorvastatin (LIPITOR) 20 MG tablet Take 20 mg by mouth daily at 6 PM.     . cyanocobalamin 1000 MCG tablet Take 2,000 mcg by mouth daily.    Marland Kitchen diltiazem (CARDIZEM CD) 120 MG 24 hr capsule Take 120 mg by mouth daily at 6 PM.    . Ferrous Sulfate (IRON PO) Take 65 mg by mouth daily.    . ferrous sulfate 325 (65 FE) MG tablet Take 325 mg by mouth daily with breakfast.    . furosemide (LASIX) 40 MG tablet Take 1 tablet (40 mg total) by mouth daily as needed. For swelling 30 tablet 3  . lisinopril (PRINIVIL,ZESTRIL) 10 MG tablet Take 10 mg by mouth daily.    . metFORMIN (GLUCOPHAGE-XR) 500 MG 24 hr tablet Take 1 tablet (500 mg total) by mouth 2 (two) times daily.    . metoprolol succinate (TOPROL-XL) 100 MG 24 hr tablet Take 100 mg by mouth daily. Take with or immediately following a meal.    . Multiple Vitamins-Minerals (PRESERVISION AREDS 2 PO) Take 1 tablet by mouth daily.    . ONE TOUCH ULTRA TEST test strip 1 each by Other route as needed (BLOOD SUGAR).     Gregory [name] DELICA LANCETS 16L MISC Apply 1 each topically daily as needed (BLOOD SUGAR).     Marland Kitchen pioglitazone (ACTOS) 15 MG tablet Take 15 mg by mouth daily.     Marland Kitchen warfarin (COUMADIN) 6 MG tablet Take 3-6 mg by mouth See admin instructions. Take 0.5 tablet (3 mg) by mouth on Thursdays & Saturdays & take 1 tablet (6 mg) by mouth on Sundays, Mondays, Tuesdays, Wednesdays, & Fridays at night.     No current facility-administered medications for this visit.    Allergies:   Aspirin, Macrobid  [nitrofurantoin], Nystatin, Prevnar 13 [pneumococcal 13-val conj vacc], Bisoprolol-hydrochlorothiazide, and Nsaids    Social History:  The patient  reports that he has never smoked. He has never used smokeless tobacco. He reports that he does not drink alcohol and does not use drugs.   Family History:  The patient's family history includes Cancer in his mother; Heart attack in his father and mother; Hypertension in his father.    ROS:  Please see the history of present illness.   Otherwise, review of systems are positive for knee pain.   All other systems are reviewed and negative.    PHYSICAL  EXAM: VS:  BP 132/66   Pulse 90   Ht 5\' 10"  (1.778 m)   Wt 258 lb 12.8 oz (117.4 kg)   SpO2 94%   BMI 37.13 kg/m  , BMI Body mass index is 37.13 kg/m. GEN: Well nourished, well developed, in no acute distress  HEENT: normal  Neck: no JVD, carotid bruits, or masses Cardiac: RRR; no murmurs, rubs, or gallops,tr bilateral edema  Respiratory:  clear to auscultation bilaterally, normal work of breathing GI: soft, nontender, nondistended, + BS MS: no deformity or atrophy  Skin: warm and dry, no rash Neuro:  Strength and sensation are intact Psych: euthymic mood, full affect   EKG:   The ekg ordered 12/25/19 demonstrates paced rhythm   Recent Labs: 09/12/2019: ALT 24; B Natriuretic Peptide 295.1 09/17/2019: Magnesium 1.6 09/18/2019: BUN 13; Creatinine, Ser 1.02; Hemoglobin 11.5; Platelets 128; Potassium 4.1; Sodium 136   Lipid Panel No results found for: CHOL, TRIG, HDL, CHOLHDL, VLDL, LDLCALC, LDLDIRECT   Other studies Reviewed: Additional studies/ records that were reviewed today with results demonstrating: labs reviewed.   ASSESSMENT AND PLAN:  1. AS: s/p TAVR.  Doing well.  SBE prophylaxis for dental cleaning.  Bottom teeth are his natural teeth. No further testing needed before knee surgery.   2. Mitral stenosis: No CHF sx. Will have routine echos for TAVR.  3. Chronic diastolic  heart failure: Appears euvolemic.  4. Hypertensive heart disease: The current medical regimen is effective;  continue present plan and medications.  Low salt diet.  Elevate legs if swelling gets worse.  5. DM: A1C 7.6 in 12/2019.  More exercise hopefully after knee is fixed. 6. Hyperlipidemia: LDL 49 in 9/21.  Continue atorvastatin.  Whle food plant based diet.  7. Not interested in COVID vaccines.  He did get his flu shot.     Current medicines are reviewed at length with the patient today.  The patient concerns regarding his medicines were addressed.  The following changes have been made:  No change  Labs/ tests ordered today include:  No orders of the defined types were placed in this encounter.   Recommend 150 minutes/week of aerobic exercise Low fat, low carb, high fiber diet recommended  Disposition:   FU in 6 months   Signed, Larae Grooms, MD  02/06/2020 1:35 PM    [facility] [address], [place], [place]  [address] Phone: [phone]; Fax: [phone]

## 2020-02-06 ENCOUNTER — Other Ambulatory Visit: Payer: Self-pay

## 2020-02-06 ENCOUNTER — Encounter: Payer: Self-pay | Admitting: Interventional Cardiology

## 2020-02-06 ENCOUNTER — Ambulatory Visit: Payer: PPO | Admitting: Interventional Cardiology

## 2020-02-06 VITALS — BP 132/66 | HR 90 | Ht 70.0 in | Wt 258.8 lb

## 2020-02-06 DIAGNOSIS — E1159 Type 2 diabetes mellitus with other circulatory complications: Secondary | ICD-10-CM | POA: Diagnosis not present

## 2020-02-06 DIAGNOSIS — Z01810 Encounter for preprocedural cardiovascular examination: Secondary | ICD-10-CM | POA: Diagnosis not present

## 2020-02-06 DIAGNOSIS — I5032 Chronic diastolic (congestive) heart failure: Secondary | ICD-10-CM

## 2020-02-06 DIAGNOSIS — Z950 Presence of cardiac pacemaker: Secondary | ICD-10-CM

## 2020-02-06 DIAGNOSIS — Z952 Presence of prosthetic heart valve: Secondary | ICD-10-CM | POA: Diagnosis not present

## 2020-02-06 DIAGNOSIS — I110 Hypertensive heart disease with heart failure: Secondary | ICD-10-CM

## 2020-02-06 DIAGNOSIS — E782 Mixed hyperlipidemia: Secondary | ICD-10-CM

## 2020-02-06 NOTE — Patient Instructions (Signed)

## 2020-02-09 DIAGNOSIS — H353132 Nonexudative age-related macular degeneration, bilateral, intermediate dry stage: Secondary | ICD-10-CM | POA: Diagnosis not present

## 2020-03-05 DIAGNOSIS — Z7901 Long term (current) use of anticoagulants: Secondary | ICD-10-CM | POA: Diagnosis not present

## 2020-03-05 DIAGNOSIS — I4891 Unspecified atrial fibrillation: Secondary | ICD-10-CM | POA: Diagnosis not present

## 2020-03-18 ENCOUNTER — Ambulatory Visit (INDEPENDENT_AMBULATORY_CARE_PROVIDER_SITE_OTHER): Payer: PPO

## 2020-03-18 DIAGNOSIS — I442 Atrioventricular block, complete: Secondary | ICD-10-CM | POA: Diagnosis not present

## 2020-03-18 LAB — CUP PACEART REMOTE DEVICE CHECK
Battery Remaining Longevity: 139 mo
Battery Voltage: 3.06 V
Brady Statistic RV Percent Paced: 94.1 %
Date Time Interrogation Session: 20211216054820
Implantable Lead Implant Date: 20210616
Implantable Lead Location: 753860
Implantable Lead Model: 5076
Implantable Pulse Generator Implant Date: 20210616
Lead Channel Impedance Value: 437 Ohm
Lead Channel Impedance Value: 532 Ohm
Lead Channel Pacing Threshold Amplitude: 0.5 V
Lead Channel Pacing Threshold Pulse Width: 0.4 ms
Lead Channel Sensing Intrinsic Amplitude: 8.5 mV
Lead Channel Sensing Intrinsic Amplitude: 8.5 mV
Lead Channel Setting Pacing Amplitude: 2.5 V
Lead Channel Setting Pacing Pulse Width: 0.4 ms
Lead Channel Setting Sensing Sensitivity: 4 mV

## 2020-03-23 DIAGNOSIS — E7800 Pure hypercholesterolemia, unspecified: Secondary | ICD-10-CM | POA: Diagnosis not present

## 2020-03-23 DIAGNOSIS — D649 Anemia, unspecified: Secondary | ICD-10-CM | POA: Diagnosis not present

## 2020-03-23 DIAGNOSIS — M1711 Unilateral primary osteoarthritis, right knee: Secondary | ICD-10-CM | POA: Diagnosis not present

## 2020-03-23 DIAGNOSIS — E1140 Type 2 diabetes mellitus with diabetic neuropathy, unspecified: Secondary | ICD-10-CM | POA: Diagnosis not present

## 2020-03-23 DIAGNOSIS — I10 Essential (primary) hypertension: Secondary | ICD-10-CM | POA: Diagnosis not present

## 2020-03-23 DIAGNOSIS — I4891 Unspecified atrial fibrillation: Secondary | ICD-10-CM | POA: Diagnosis not present

## 2020-04-01 NOTE — Progress Notes (Signed)
Remote pacemaker transmission.   

## 2020-04-30 DIAGNOSIS — M1711 Unilateral primary osteoarthritis, right knee: Secondary | ICD-10-CM | POA: Diagnosis not present

## 2020-04-30 DIAGNOSIS — E7800 Pure hypercholesterolemia, unspecified: Secondary | ICD-10-CM | POA: Diagnosis not present

## 2020-04-30 DIAGNOSIS — I10 Essential (primary) hypertension: Secondary | ICD-10-CM | POA: Diagnosis not present

## 2020-04-30 DIAGNOSIS — E1140 Type 2 diabetes mellitus with diabetic neuropathy, unspecified: Secondary | ICD-10-CM | POA: Diagnosis not present

## 2020-04-30 DIAGNOSIS — I4891 Unspecified atrial fibrillation: Secondary | ICD-10-CM | POA: Diagnosis not present

## 2020-04-30 DIAGNOSIS — D649 Anemia, unspecified: Secondary | ICD-10-CM | POA: Diagnosis not present

## 2020-05-07 DIAGNOSIS — I4891 Unspecified atrial fibrillation: Secondary | ICD-10-CM | POA: Diagnosis not present

## 2020-05-07 DIAGNOSIS — Z7901 Long term (current) use of anticoagulants: Secondary | ICD-10-CM | POA: Diagnosis not present

## 2020-05-17 DIAGNOSIS — H353132 Nonexudative age-related macular degeneration, bilateral, intermediate dry stage: Secondary | ICD-10-CM | POA: Diagnosis not present

## 2020-05-17 DIAGNOSIS — E119 Type 2 diabetes mellitus without complications: Secondary | ICD-10-CM | POA: Diagnosis not present

## 2020-06-17 ENCOUNTER — Ambulatory Visit (INDEPENDENT_AMBULATORY_CARE_PROVIDER_SITE_OTHER): Payer: PPO

## 2020-06-17 DIAGNOSIS — I442 Atrioventricular block, complete: Secondary | ICD-10-CM | POA: Diagnosis not present

## 2020-06-17 LAB — CUP PACEART REMOTE DEVICE CHECK
Battery Remaining Longevity: 136 mo
Battery Voltage: 3.04 V
Brady Statistic RV Percent Paced: 87.57 %
Date Time Interrogation Session: 20220317062408
Implantable Lead Implant Date: 20210616
Implantable Lead Location: 753860
Implantable Lead Model: 5076
Implantable Pulse Generator Implant Date: 20210616
Lead Channel Impedance Value: 437 Ohm
Lead Channel Impedance Value: 532 Ohm
Lead Channel Pacing Threshold Amplitude: 0.375 V
Lead Channel Pacing Threshold Pulse Width: 0.4 ms
Lead Channel Sensing Intrinsic Amplitude: 7.5 mV
Lead Channel Sensing Intrinsic Amplitude: 7.5 mV
Lead Channel Setting Pacing Amplitude: 2.5 V
Lead Channel Setting Pacing Pulse Width: 0.4 ms
Lead Channel Setting Sensing Sensitivity: 4 mV

## 2020-06-22 DIAGNOSIS — D649 Anemia, unspecified: Secondary | ICD-10-CM | POA: Diagnosis not present

## 2020-06-22 DIAGNOSIS — I10 Essential (primary) hypertension: Secondary | ICD-10-CM | POA: Diagnosis not present

## 2020-06-22 DIAGNOSIS — M1711 Unilateral primary osteoarthritis, right knee: Secondary | ICD-10-CM | POA: Diagnosis not present

## 2020-06-22 DIAGNOSIS — E1140 Type 2 diabetes mellitus with diabetic neuropathy, unspecified: Secondary | ICD-10-CM | POA: Diagnosis not present

## 2020-06-22 DIAGNOSIS — E7800 Pure hypercholesterolemia, unspecified: Secondary | ICD-10-CM | POA: Diagnosis not present

## 2020-06-22 DIAGNOSIS — I4891 Unspecified atrial fibrillation: Secondary | ICD-10-CM | POA: Diagnosis not present

## 2020-06-25 NOTE — Progress Notes (Signed)
Remote pacemaker transmission.   

## 2020-07-14 DIAGNOSIS — Z6835 Body mass index (BMI) 35.0-35.9, adult: Secondary | ICD-10-CM | POA: Diagnosis not present

## 2020-07-14 DIAGNOSIS — Z7901 Long term (current) use of anticoagulants: Secondary | ICD-10-CM | POA: Diagnosis not present

## 2020-07-14 DIAGNOSIS — E7800 Pure hypercholesterolemia, unspecified: Secondary | ICD-10-CM | POA: Diagnosis not present

## 2020-07-14 DIAGNOSIS — I4891 Unspecified atrial fibrillation: Secondary | ICD-10-CM | POA: Diagnosis not present

## 2020-07-14 DIAGNOSIS — E1140 Type 2 diabetes mellitus with diabetic neuropathy, unspecified: Secondary | ICD-10-CM | POA: Diagnosis not present

## 2020-07-14 DIAGNOSIS — M1711 Unilateral primary osteoarthritis, right knee: Secondary | ICD-10-CM | POA: Diagnosis not present

## 2020-07-27 ENCOUNTER — Other Ambulatory Visit: Payer: Self-pay | Admitting: Physician Assistant

## 2020-07-27 DIAGNOSIS — Z952 Presence of prosthetic heart valve: Secondary | ICD-10-CM

## 2020-07-30 DIAGNOSIS — I10 Essential (primary) hypertension: Secondary | ICD-10-CM | POA: Diagnosis not present

## 2020-07-30 DIAGNOSIS — M1711 Unilateral primary osteoarthritis, right knee: Secondary | ICD-10-CM | POA: Diagnosis not present

## 2020-07-30 DIAGNOSIS — I4891 Unspecified atrial fibrillation: Secondary | ICD-10-CM | POA: Diagnosis not present

## 2020-07-30 DIAGNOSIS — D649 Anemia, unspecified: Secondary | ICD-10-CM | POA: Diagnosis not present

## 2020-07-30 DIAGNOSIS — E7800 Pure hypercholesterolemia, unspecified: Secondary | ICD-10-CM | POA: Diagnosis not present

## 2020-07-30 DIAGNOSIS — E1140 Type 2 diabetes mellitus with diabetic neuropathy, unspecified: Secondary | ICD-10-CM | POA: Diagnosis not present

## 2020-09-08 DIAGNOSIS — Z7901 Long term (current) use of anticoagulants: Secondary | ICD-10-CM | POA: Diagnosis not present

## 2020-09-16 ENCOUNTER — Ambulatory Visit (INDEPENDENT_AMBULATORY_CARE_PROVIDER_SITE_OTHER): Payer: PPO

## 2020-09-16 DIAGNOSIS — I442 Atrioventricular block, complete: Secondary | ICD-10-CM

## 2020-09-16 LAB — CUP PACEART REMOTE DEVICE CHECK
Battery Remaining Longevity: 133 mo
Battery Voltage: 3.03 V
Brady Statistic RV Percent Paced: 91.35 %
Date Time Interrogation Session: 20220616025417
Implantable Lead Implant Date: 20210616
Implantable Lead Location: 753860
Implantable Lead Model: 5076
Implantable Pulse Generator Implant Date: 20210616
Lead Channel Impedance Value: 456 Ohm
Lead Channel Impedance Value: 532 Ohm
Lead Channel Pacing Threshold Amplitude: 0.375 V
Lead Channel Pacing Threshold Pulse Width: 0.4 ms
Lead Channel Sensing Intrinsic Amplitude: 7.75 mV
Lead Channel Sensing Intrinsic Amplitude: 7.75 mV
Lead Channel Setting Pacing Amplitude: 2.5 V
Lead Channel Setting Pacing Pulse Width: 0.4 ms
Lead Channel Setting Sensing Sensitivity: 4 mV

## 2020-09-20 DIAGNOSIS — I4891 Unspecified atrial fibrillation: Secondary | ICD-10-CM | POA: Diagnosis not present

## 2020-09-20 DIAGNOSIS — I10 Essential (primary) hypertension: Secondary | ICD-10-CM | POA: Diagnosis not present

## 2020-09-20 DIAGNOSIS — M1711 Unilateral primary osteoarthritis, right knee: Secondary | ICD-10-CM | POA: Diagnosis not present

## 2020-09-20 DIAGNOSIS — E7800 Pure hypercholesterolemia, unspecified: Secondary | ICD-10-CM | POA: Diagnosis not present

## 2020-09-20 DIAGNOSIS — D649 Anemia, unspecified: Secondary | ICD-10-CM | POA: Diagnosis not present

## 2020-09-20 DIAGNOSIS — E1140 Type 2 diabetes mellitus with diabetic neuropathy, unspecified: Secondary | ICD-10-CM | POA: Diagnosis not present

## 2020-09-24 ENCOUNTER — Ambulatory Visit: Payer: PPO | Admitting: Cardiology

## 2020-09-24 ENCOUNTER — Other Ambulatory Visit: Payer: Self-pay

## 2020-09-24 ENCOUNTER — Encounter: Payer: Self-pay | Admitting: Cardiology

## 2020-09-24 VITALS — BP 112/68 | HR 73 | Ht 70.0 in | Wt 246.0 lb

## 2020-09-24 DIAGNOSIS — I442 Atrioventricular block, complete: Secondary | ICD-10-CM

## 2020-09-24 NOTE — Progress Notes (Signed)
Rate is   Electrophysiology Office Note   Date:  09/24/2020   ID:  Gregory [name], DOB 1940/10/20, MRN [id number]  PCP:  Kathyrn Lass, MD  Cardiologist:  Irish Lack Primary Electrophysiologist:  Fendi Meinhardt Meredith Leeds, MD    Chief Complaint: complete heart block   History of Present Illness: Gregory [name] is a 80 y.o. male who is being seen today for the evaluation of complete heart block at the request of Kathyrn Lass, MD. Presenting today for electrophysiology evaluation.  He has a history of permanent atrial fibrillation, chronic diastolic heart failure, mitral regurgitation, non-Hodgkin's lymphoma, diabetes, hypertension, severe MAC with moderate to severe MR, and severe aortic stenosis status post TAVR 09/16/2019.  Post TAVR, he was noted to have complete heart block and is now status post Medtronic single-chamber pacemaker implanted 09/17/2019.  Today, denies symptoms of palpitations, chest pain, shortness of breath, orthopnea, PND, lower extremity edema, claudication, dizziness, presyncope, syncope, bleeding, or neurologic sequela. The patient is tolerating medications without difficulties.  He is currently feeling well.  He has no chest pain or shortness of breath.  He is able to do all of his daily activities, only limited by his right knee.  He is potentially planning for surgical evaluation.  He also recently had his upper teeth pulled and is working to get implants.  He has dentures now that do not fit well and he is unhappy with the situation.   Past Medical History:  Diagnosis Date   Amputation finger    Distal 4th & 5th digits of right hand   Arthritis    Chronic atrial fibrillation (HCC)    Chronic diastolic CHF (congestive heart failure) (HCC)    Diabetes mellitus type 2, noninsulin dependent (HCC)    History of blood transfusion    5 pints for peptic ulcer   History of kidney stones    Passed x 2   Hypertension    Mitral valve disease    mild-moderate MR, moderate MS  07/15/19 echo   Other malignant lymphomas, unspecified site, extranodal and solid organ sites    Non Hodkins Lymothopathy   Ruptured disk    S/P TAVR (transcatheter aortic valve replacement) 09/16/2019   s/p TAVR with a 29 mm Edwards Sapien 3 THV via the TF approach by Dr. Burt Knack and Dr. Cyndia Bent   Severe aortic stenosis    Ulcer    peptic Ulcer - bleeding   Past Surgical History:  Procedure Laterality Date   APPENDECTOMY  1967   COLONOSCOPY     left knee surgery     left shoulder surgery     MULTIPLE EXTRACTIONS WITH ALVEOLOPLASTY N/A 09/04/2019   Procedure: Extraction of tooth #'s 2,7,8,9,11,13,14 20,30, and 31 with alveoloplatsty and gross debridement of remaining teeth;  Surgeon: Lenn Cal, DDS;  Location: [facility];  Service: Oral Surgery;  Laterality: N/A;   PACEMAKER IMPLANT N/A 09/17/2019   Procedure: PACEMAKER IMPLANT;  Surgeon: Constance Haw, MD;  Location: [facility];  Service: Cardiovascular;  Laterality: N/A;   RIGHT/LEFT HEART CATH AND CORONARY ANGIOGRAPHY N/A 08/12/2019   Procedure: RIGHT/LEFT HEART CATH AND CORONARY ANGIOGRAPHY;  Surgeon: Jettie Booze, MD;  Location: [facility];  Service: Cardiovascular;  Laterality: N/A;   TEE WITHOUT CARDIOVERSION N/A 09/16/2019   Procedure: TRANSESOPHAGEAL ECHOCARDIOGRAM (TEE);  Surgeon: Sherren Mocha, MD;  Location: [facility];  Service: Open Heart Surgery;  Laterality: N/A;   TONSILLECTOMY     TRANSCATHETER AORTIC VALVE REPLACEMENT,  TRANSFEMORAL N/A 09/16/2019   Procedure: TRANSCATHETER AORTIC VALVE REPLACEMENT, TRANSFEMORAL;  Surgeon: Sherren Mocha, MD;  Location: [facility];  Service: Open Heart Surgery;  Laterality: N/A;     Current Outpatient Medications  Medication Sig Dispense Refill   amoxicillin (AMOXIL) 500 MG capsule Take 4 tablets by mouth 60 minutes prior to any dental procedures 12 capsule 3   atorvastatin (LIPITOR) 20 MG tablet Take 20 mg by mouth daily at 6 PM.       cyanocobalamin 1000 MCG tablet Take 2,000 mcg by mouth daily.     diltiazem (CARDIZEM CD) 120 MG 24 hr capsule Take 120 mg by mouth daily at 6 PM.     Ferrous Sulfate (IRON PO) Take 65 mg by mouth daily.     ferrous sulfate 325 (65 FE) MG tablet Take 325 mg by mouth daily with breakfast.     furosemide (LASIX) 40 MG tablet Take 1 tablet (40 mg total) by mouth daily as needed. For swelling 30 tablet 3   lisinopril (PRINIVIL,ZESTRIL) 10 MG tablet Take 10 mg by mouth daily.     metFORMIN (GLUCOPHAGE-XR) 500 MG 24 hr tablet Take 1 tablet (500 mg total) by mouth 2 (two) times daily.     metoprolol succinate (TOPROL-XL) 100 MG 24 hr tablet Take 100 mg by mouth daily. Take with or immediately following a meal.     Multiple Vitamins-Minerals (PRESERVISION AREDS 2 PO) Take 1 tablet by mouth daily.     ONE TOUCH ULTRA TEST test strip 1 each by Other route as needed (BLOOD SUGAR).      ONETOUCH DELICA LANCETS 29U MISC Apply 1 each topically daily as needed (BLOOD SUGAR).      pioglitazone (ACTOS) 15 MG tablet Take 15 mg by mouth daily.      warfarin (COUMADIN) 6 MG tablet Take 3-6 mg by mouth See admin instructions. Take 0.5 tablet (3 mg) by mouth on Thursdays & Saturdays & take 1 tablet (6 mg) by mouth on Sundays, Mondays, Tuesdays, Wednesdays, & Fridays at night.     tamsulosin (FLOMAX) 0.4 MG CAPS capsule Take 0.4 mg by mouth at bedtime.     No current facility-administered medications for this visit.    Allergies:   Aspirin, Macrobid [nitrofurantoin], Nystatin, Prevnar 13 [pneumococcal 13-val conj vacc], Bisoprolol-hydrochlorothiazide, and Nsaids   Social History:  The patient  reports that he has never smoked. He has never used smokeless tobacco. He reports that he does not drink alcohol and does not use drugs.   Family History:  The patient's family history includes Cancer in his mother; Heart attack in his father and mother; Hypertension in his father.   ROS:  Please see the history of present  illness.   Otherwise, review of systems is positive for none.   All other systems are reviewed and negative.   PHYSICAL EXAM: VS:  BP 112/68   Pulse 73   Ht _0  (1.778 m)   Wt 246 lb (111.6 kg)   BMI 35.30 kg/m  , BMI Body mass index is 35.3 kg/m. GEN: Well nourished, well developed, in no acute distress  HEENT: normal  Neck: no JVD, carotid bruits, or masses Cardiac: RRR; no murmurs, rubs, or gallops,no edema  Respiratory:  clear to auscultation bilaterally, normal work of breathing GI: soft, nontender, nondistended, + BS MS: no deformity or atrophy  Skin: warm and dry, device site well healed Neuro:  Strength and sensation are intact Psych: euthymic mood, full affect  EKG:  EKG is ordered today. Personal review of the ekg ordered shows AF, V paced  Personal review of the device interrogation today. Results in [facility]: No results found for requested labs within last 8760 hours.    Lipid Panel  No results found for: CHOL, TRIG, HDL, CHOLHDL, VLDL, LDLCALC, LDLDIRECT   Wt Readings from Last 3 Encounters:  09/24/20 246 lb (111.6 kg)  02/06/20 258 lb 12.8 oz (117.4 kg)  12/25/19 252 lb (114.3 kg)      Other studies Reviewed: Additional studies/ records that were reviewed today include: TTE 10/22/19  Review of the above records today demonstrates:   1. Ventricular bigeminy present.   2. Left ventricular ejection fraction, by estimation, is 55 to 60%. The  left ventricle has normal function. The left ventricle has no regional  wall motion abnormalities. Left ventricular diastolic function could not  be evaluated.   3. Right ventricular systolic function is low normal. The right  ventricular size is mildly enlarged. There is mildly elevated pulmonary  artery systolic pressure. The estimated right ventricular systolic  pressure is 30.1 mmHg.   4. Left atrial size was severely dilated.   5. Right atrial size was mild to moderately dilated.   6. The  mitral valve is degenerative. Mild to moderate mitral valve  regurgitation. Moderate to severe mitral stenosis. The mean mitral valve  gradient is 8.3 mmHg with average heart rate of 33 bpm.   7. 29 mm S3. V max 2.0 m/s, MG 8 mmHG, EOA 2.03 cm2, DI 0.41. No evidence  of paravalvular leak. Difficult assessment due to ventricular bigeminy.  The aortic valve has been repaired/replaced. Aortic valve regurgitation is  not visualized. There is a 29 mm   Edwards Sapien prosthetic (TAVR) valve present in the aortic position.  Procedure Date: 09/16/2019. Echo findings are consistent with normal  structure and function of the aortic valve prosthesis.   8. The inferior vena cava is normal in size with greater than 50%  respiratory variability, suggesting right atrial pressure of 3 mmHg.    ASSESSMENT AND PLAN:  1.  Complete heart block: Occurred post TAVR.  He is status post Medtronic single-chamber pacemaker.  Device functioning appropriately.  No changes at this time.    2.  Permanent atrial fibrillation: CHA2DS2-VASc of 5.  Currently on Coumadin.    3.  Severe aortic stenosis: Status post TAVR.  Echo with a stable valve.  Plan per primary cardiology.    4.  PVCs: Continue Toprol-XL  Current medicines are reviewed at length with the patient today.   The patient does not have concerns regarding his medicines.  The following changes were made today:  none  Labs/ tests ordered today include:  Orders Placed This Encounter  Procedures   EKG 12-Lead      Disposition:   FU with Jaidy Cottam 12 months  Signed, Chiffon Kittleson Meredith Leeds, MD  09/24/2020 2:52 PM     [address] [place] [address] [phone] (office) [phone] (fax)

## 2020-10-06 DIAGNOSIS — I4891 Unspecified atrial fibrillation: Secondary | ICD-10-CM | POA: Diagnosis not present

## 2020-10-06 DIAGNOSIS — Z7901 Long term (current) use of anticoagulants: Secondary | ICD-10-CM | POA: Diagnosis not present

## 2020-10-07 NOTE — Progress Notes (Signed)
Remote pacemaker transmission.   

## 2020-10-20 ENCOUNTER — Other Ambulatory Visit: Payer: Self-pay

## 2020-10-20 ENCOUNTER — Ambulatory Visit (HOSPITAL_COMMUNITY): Payer: PPO | Attending: Cardiology

## 2020-10-20 ENCOUNTER — Encounter: Payer: Self-pay | Admitting: Physician Assistant

## 2020-10-20 ENCOUNTER — Ambulatory Visit: Payer: PPO | Admitting: Physician Assistant

## 2020-10-20 VITALS — BP 110/62 | HR 76 | Ht 70.0 in | Wt 247.8 lb

## 2020-10-20 DIAGNOSIS — Z952 Presence of prosthetic heart valve: Secondary | ICD-10-CM | POA: Insufficient documentation

## 2020-10-20 DIAGNOSIS — I493 Ventricular premature depolarization: Secondary | ICD-10-CM | POA: Diagnosis not present

## 2020-10-20 DIAGNOSIS — I4819 Other persistent atrial fibrillation: Secondary | ICD-10-CM | POA: Diagnosis not present

## 2020-10-20 DIAGNOSIS — I059 Rheumatic mitral valve disease, unspecified: Secondary | ICD-10-CM

## 2020-10-20 DIAGNOSIS — K862 Cyst of pancreas: Secondary | ICD-10-CM

## 2020-10-20 DIAGNOSIS — Z950 Presence of cardiac pacemaker: Secondary | ICD-10-CM

## 2020-10-20 DIAGNOSIS — I5032 Chronic diastolic (congestive) heart failure: Secondary | ICD-10-CM

## 2020-10-20 LAB — ECHOCARDIOGRAM COMPLETE
AR max vel: 2.21 cm2
AV Area VTI: 2.43 cm2
AV Area mean vel: 2.29 cm2
AV Mean grad: 5.6 mmHg
AV Peak grad: 10.1 mmHg
Ao pk vel: 1.59 m/s
MV M vel: 5.03 m/s
MV Peak grad: 101.3 mmHg
MV VTI: 1.22 cm2
P 1/2 time: 457 msec
P 1/2 time: 93.2 msec
Radius: 1.03 cm
S' Lateral: 3.7 cm

## 2020-10-20 NOTE — Progress Notes (Signed)
[name]                                       Cardiology Office Note    Date:  10/20/2020   ID:  Gregory [name], DOB Mar 17, 1941, MRN [id number]  PCP:  Kathyrn Lass, MD  Cardiologist: Larae Grooms, MD / Dr. Burt Knack & Dr. Cyndia Bent (TAVR)  CC: 1 year s/p TAVR  History of Present Illness:  Gregory [name] is a 80 y.o. male with a history of persistant atrial fibrillation on coumadin, RBBB, chronic diastolic CHF, Non Hodgkins lymphoma, DM, HTN, severe MAC with mod MS/MR and severe aortic stenosis s/p TAVR (09/16/19) c/b CHB s/p PPM (09/17/19) who presents to clinic for follow up.   Echo in April 2021 showed LVEF=55%, mild MR, mild to moderate mitral stenosis and severe aortic stenosis with mean gradient 44 mmHg, peak gradient 74 mmHg, AVA 0.69 cm2, dimensionless index 0.17. Cardiac cath May 2021 with no significant CAD. He underwent dental extractions on 09/04/19.   He was evaluated by the multidisciplinary valve team and underwent successful TAVR with a 29 mm Edwards Sapien 3 THV via the TF approach on 09/16/19. Post operative echo showed EF 55-60%, normally functioning TAVR with a mean gradient of 11.1 mm Hg and trivial perivalvular leak at the posterior aspect of the aortic prosthesis. Post TAVR he developed CHB that never resolved. His temp wire was left in place and he underwent PPM with a Medtronic Azure XT DR MRI SureScan single-chamber pacemaker on 09/17/19 by Dr. Curt Bears. Of note, the pt had left sided subclavian occlusion and required PM implantation on the right side. He was resumed on coumadin with the addition of a baby aspirin x 50month.  At follow up he complained of fatigue and visual disturbance caused by aspirin and this was discontinued. He also had some mild swelling for which I asked him to take PRN lasix. 1 month echo showed Echo today shows EF 55%, normally functioning TAVR with a mean gradient of 8 mmHg and no PVL. There was  mild-mod MR and mod to severe MS.  Today he presents to clinic for follow up. Doing well aside from knee issue. Pt reluctant to get surgery as he doesn't want to ho to hospital. No CP or SOB. No LE edema, orthopnea or PND. No dizziness or syncope. No blood in stool or urine. No palpitations. He is mostly upset about ongoing bills from dental extractions and ill fitting dentures.    Past Medical History:  Diagnosis Date   Amputation finger    Distal 4th & 5th digits of right hand   Arthritis    Chronic atrial fibrillation (HCC)    Chronic diastolic CHF (congestive heart failure) (HCC)    Diabetes mellitus type 2, noninsulin dependent (HCC)    History of blood transfusion    5 pints for peptic ulcer   History of kidney stones    Passed x 2   Hypertension    Mitral valve disease    mild-moderate MR, moderate MS 07/15/19 echo   Other malignant lymphomas, unspecified site, extranodal and solid organ sites    Non Hodkins Lymothopathy   Ruptured disk    S/P TAVR (transcatheter aortic valve replacement) 09/16/2019   s/p TAVR with a 29 mm Edwards Sapien 3 THV via the TF approach by Dr. CBurt Knackand Dr. BCyndia Bent  Severe aortic stenosis    Ulcer    peptic Ulcer - bleeding    Past Surgical History:  Procedure Laterality Date   APPENDECTOMY  1967   COLONOSCOPY     left knee surgery     left shoulder surgery     MULTIPLE EXTRACTIONS WITH ALVEOLOPLASTY N/A 09/04/2019   Procedure: Extraction of tooth #'s 2,7,8,9,11,13,14 20,30, and 31 with alveoloplatsty and gross debridement of remaining teeth;  Surgeon: Lenn Cal, DDS;  Location: [facility];  Service: Oral Surgery;  Laterality: N/A;   PACEMAKER IMPLANT N/A 09/17/2019   Procedure: PACEMAKER IMPLANT;  Surgeon: Constance Haw, MD;  Location: [facility];  Service: Cardiovascular;  Laterality: N/A;   RIGHT/LEFT HEART CATH AND CORONARY ANGIOGRAPHY N/A 08/12/2019   Procedure: RIGHT/LEFT HEART CATH AND CORONARY ANGIOGRAPHY;  Surgeon:  Jettie Booze, MD;  Location: [facility];  Service: Cardiovascular;  Laterality: N/A;   TEE WITHOUT CARDIOVERSION N/A 09/16/2019   Procedure: TRANSESOPHAGEAL ECHOCARDIOGRAM (TEE);  Surgeon: Sherren Mocha, MD;  Location: [facility];  Service: Open Heart Surgery;  Laterality: N/A;   TONSILLECTOMY     TRANSCATHETER AORTIC VALVE REPLACEMENT, TRANSFEMORAL N/A 09/16/2019   Procedure: TRANSCATHETER AORTIC VALVE REPLACEMENT, TRANSFEMORAL;  Surgeon: Sherren Mocha, MD;  Location: [facility];  Service: Open Heart Surgery;  Laterality: N/A;    Current Medications: Outpatient Medications Prior to Visit  Medication Sig Dispense Refill   atorvastatin (LIPITOR) 20 MG tablet Take 20 mg by mouth daily at 6 PM.      cyanocobalamin 1000 MCG tablet Take 2,000 mcg by mouth daily.     diltiazem (CARDIZEM CD) 120 MG 24 hr capsule Take 120 mg by mouth daily at 6 PM.     Ferrous Sulfate (IRON PO) Take 65 mg by mouth daily.     ferrous sulfate 325 (65 FE) MG tablet Take 325 mg by mouth daily with breakfast.     furosemide (LASIX) 40 MG tablet Take 1 tablet (40 mg total) by mouth daily as needed. For swelling 30 tablet 3   lisinopril (PRINIVIL,ZESTRIL) 10 MG tablet Take 10 mg by mouth daily.     metFORMIN (GLUCOPHAGE-XR) 500 MG 24 hr tablet Take 1 tablet (500 mg total) by mouth 2 (two) times daily.     metoprolol succinate (TOPROL-XL) 100 MG 24 hr tablet Take 100 mg by mouth daily. Take with or immediately following a meal.     Multiple Vitamins-Minerals (PRESERVISION AREDS 2 PO) Take 1 tablet by mouth daily.     ONE TOUCH ULTRA TEST test strip 1 each by Other route as needed (BLOOD SUGAR).      ONETOUCH DELICA LANCETS 19J MISC Apply 1 each topically daily as needed (BLOOD SUGAR).      pioglitazone (ACTOS) 15 MG tablet Take 15 mg by mouth daily.      tamsulosin (FLOMAX) 0.4 MG CAPS capsule Take 0.4 mg by mouth at bedtime.     warfarin (COUMADIN) 6 MG tablet Take 3-6 mg by mouth See  admin instructions. Take 0.5 tablet (3 mg) by mouth on Thursdays & Saturdays & take 1 tablet (6 mg) by mouth on Sundays, Mondays, Tuesdays, Wednesdays, & Fridays at night.     amoxicillin (AMOXIL) 500 MG capsule Take 4 tablets by mouth 60 minutes prior to any dental procedures 12 capsule 3   No facility-administered medications prior to visit.     Allergies:   Aspirin, Macrobid [nitrofurantoin], Nystatin, Prevnar 13 [pneumococcal 13-val conj  vacc], Bisoprolol-hydrochlorothiazide, and Nsaids   Social History   Socioeconomic History   Marital status: Single    Spouse name: Not on file   Number of children: 0   Years of education: Not on file   Highest education level: Not on file  Occupational History   Occupation: Retired-Trucking  Tobacco Use   Smoking status: Never   Smokeless tobacco: Never  Vaping Use   Vaping Use: Never used  Substance and Sexual Activity   Alcohol use: No   Drug use: No   Sexual activity: Never  Other Topics Concern   Not on file  Social History Narrative   Not on file   Social Determinants of Health   Financial Resource Strain: Not on file  Food Insecurity: Not on file  Transportation Needs: Not on file  Physical Activity: Not on file  Stress: Not on file  Social Connections: Not on file     Family History:  The patient's family history includes Cancer in his mother; Heart attack in his father and mother; Hypertension in his father.     ROS:   Please see the history of present illness.    ROS All other systems reviewed and are negative.   PHYSICAL EXAM:   VS:  BP 110/62   Pulse 76   Ht _0  (1.778 m)   Wt 247 lb 12.8 oz (112.4 kg)   SpO2 98%   BMI 35.56 kg/m    GEN: Well nourished, well developed, in no acute distress, obese HEENT: normal Neck: no JVD or masses Cardiac: irreg irreg; soft flow murmur. Systolic and diastolic murmur at apex. No rubs, or gallops. Respiratory:  clear to auscultation bilaterally, normal work of  breathing GI: soft, nontender, nondistended, + BS MS: no deformity or atrophy Skin: warm and dry, no rash.  Neuro:  Alert and Oriented x 3, Strength and sensation are intact Psych: euthymic mood, full affect   Wt Readings from Last 3 Encounters:  10/20/20 247 lb 12.8 oz (112.4 kg)  09/24/20 246 lb (111.6 kg)  02/06/20 258 lb 12.8 oz (117.4 kg)      Studies/Labs Reviewed:   EKG:  EKG is NOT ordered today  Recent Labs: No results found for requested labs within last 8760 hours.   Lipid Panel No results found for: CHOL, TRIG, HDL, CHOLHDL, VLDL, LDLCALC, LDLDIRECT  Additional studies/ records that were reviewed today include:  TAVR OPERATIVE NOTE     Date of Procedure:                09/16/2019   Preoperative Diagnosis:      Severe Aortic Stenosis    Postoperative Diagnosis:    Same    Procedure:        Transcatheter Aortic Valve Replacement - Percutaneous Right Transfemoral Approach             Edwards Sapien 3 THV (size 29 mm, model # 9600TFX, serial # [id number])              Co-Surgeons:                        Gaye Pollack, MD and Sherren Mocha, MD       Anesthesiologist:                  Annye Asa, MD   Echocardiographer:              Bertrum Sol, MD   Pre-operative  Echo Findings: Severe aortic stenosis Normal left ventricular systolic function   Post-operative Echo Findings: Mild paravalvular leak Normal left ventricular systolic function   _____________   Echo 09/16/19 IMPRESSIONS    1. Ventricular bigeminy present.  2. Left ventricular ejection fraction, by estimation, is 55 to 60%. The left ventricle has normal function. The left ventricle has no regional wall motion abnormalities. Left ventricular diastolic function could not be evaluated.  3. Right ventricular systolic function is low normal. The right ventricular size is mildly enlarged. There is mildly elevated pulmonary artery systolic pressure. The estimated right ventricular systolic  pressure is 44.3 mmHg.  4. Left atrial size was severely dilated.  5. Right atrial size was mild to moderately dilated.  6. The mitral valve is degenerative. Mild to moderate mitral valve regurgitation. Moderate to severe mitral stenosis. The mean mitral valve gradient is 8.3 mmHg with average heart rate of 33 bpm.  7. 29 mm S3. V max 2.0 m/s, MG 8 mmHG, EOA 2.03 cm2, DI 0.41. No evidence of paravalvular leak. Difficult assessment due to ventricular bigeminy. The aortic valve has been repaired/replaced. Aortic valve regurgitation is not visualized. There is a 29 mm  Edwards Sapien prosthetic (TAVR) valve present in the aortic position. Procedure Date: 09/16/2019. Echo findings are consistent with normal structure and function of the aortic valve prosthesis.  8. The inferior vena cava is normal in size with greater than 50% respiratory variability, suggesting right atrial pressure of 3 mmHg.   Comparison(s): No significant change from prior study. TAVR is stable.  1. Left ventricular ejection fraction, by estimation, is 55 to 60%. The  left ventricle has normal function. The left ventricle has no regional  wall motion abnormalities. There is mild concentric left ventricular  hypertrophy. Left ventricular diastolic function could not be evaluated.   2. Right ventricular systolic function is normal. The right ventricular  size is normal. There is moderately elevated pulmonary artery systolic  pressure. The estimated right ventricular systolic pressure is 15.4 mmHg.   3. Left atrial size was mild to moderately dilated.   4. The mitral valve is degenerative. Moderate mitral valve regurgitation.  Moderate mitral stenosis. The mean mitral valve gradient is 7.0 mmHg with average heart rate of 60 bpm.   5. The aortic valve has been repaired/replaced. Aortic valve  regurgitation is trivial. There is a 29 mm Edwards Sapien prosthetic  (TAVR) valve present in the aortic position. Procedure Date: 09/16/2019.   Trivial perivalvular leak at the posterior aspect  of the aortic prosthesis.   6. The inferior vena cava is dilated in size with <50% respiratory  variability, suggesting right atrial pressure of 15 mmHg.  Comparison(s): No significant change from prior study. Prior images  reviewed side by side.    _______________   09/17/19 PACEMAKER IMPLANT  Conclusion SURGEON:  Will Meredith Leeds, MD     PREPROCEDURE DIAGNOSIS:  Complete heart block    POSTPROCEDURE DIAGNOSIS:  Complete heart block     PROCEDURES:   1. Left and right upper extremity venography.   2. Pacemaker implantation.   __________________  Echo 10/22/19 IMPRESSIONS   1. Ventricular bigeminy present.   2. Left ventricular ejection fraction, by estimation, is 55 to 60%. The  left ventricle has normal function. The left ventricle has no regional  wall motion abnormalities. Left ventricular diastolic function could not  be evaluated.   3. Right ventricular systolic function is low normal. The right  ventricular size is mildly enlarged. There is  mildly elevated pulmonary  artery systolic pressure. The estimated right ventricular systolic  pressure is 68.3 mmHg.   4. Left atrial size was severely dilated.   5. Right atrial size was mild to moderately dilated.   6. The mitral valve is degenerative. Mild to moderate mitral valve  regurgitation. Moderate to severe mitral stenosis. The mean mitral valve  gradient is 8.3 mmHg with average heart rate of 33 bpm.   7. 29 mm S3. V max 2.0 m/s, MG 8 mmHG, EOA 2.03 cm2, DI 0.41. No evidence  of paravalvular leak. Difficult assessment due to ventricular bigeminy.  The aortic valve has been repaired/replaced. Aortic valve regurgitation is  not visualized. There is a 29 mm  Edwards Sapien prosthetic (TAVR) valve present in the aortic position. Procedure Date: 09/16/2019. Echo findings are consistent with normal structure and function of the aortic valve prosthesis.   8. The inferior  vena cava is normal in size with greater than 50%  respiratory variability, suggesting right atrial pressure of 3 mmHg.   Comparison(s): No significant change from prior study. TAVR is stable.   _____________________  Echo 10/20/20 IMPRESSIONS  1. Left ventricular ejection fraction, by estimation, is 55 to 60%. The left ventricle has normal function. The left ventricle has no regional wall motion abnormalities. There is mild concentric left ventricular hypertrophy. Left ventricular diastolic function could not be evaluated.  2. Right ventricular systolic function is normal. The right ventricular size is normal. There is severely elevated pulmonary artery systolic pressure. The estimated right ventricular systolic pressure is 41.9 mmHg.  3. Left atrial size was severely dilated.  4. Right atrial size was mild to moderately dilated.  5. The mitral valve is abnormal. Moderate mitral valve regurgitation. Moderate to severe mitral stenosis. The mean mitral valve gradient is 7.3 mmHg with average heart rate of 50 bpm. Severe mitral annular calcification.  6. The aortic valve has been repaired/replaced. Aortic valve regurgitation is trivial. There is a 29 mm Sapien prosthetic (TAVR) valve present in the aortic position. Procedure Date: 09/16/2019. Echo findings are consistent with normal structure and function of the aortic valve prosthesis. Aortic valve area, by VTI measures 2.43 cm. Aortic valve mean gradient measures 5.6 mmHg. Aortic valve Vmax measures 1.59 m/s.  7. The inferior vena cava is dilated in size with <50% respiratory variability, suggesting right atrial pressure of 15 mmHg.   Comparison(s): No significant change from prior study. Prior images reviewed side by side.   Conclusion(s)/Recommendation(s): Moderate to severe mitral stenosis. Mean gradient ~7 mmHg at 50 bpm. Given LA dilation, severe pulmonary hypertension, suspect this may be severe. Consider stress echo to monitor gradients  with increase HR/exercise.    ASSESSMENT & PLAN:   Severe AS s/p TAVR: echo today shows EF 55%, normally functioning TAVR with a mean gradient of 5.6 mm hg and trivial PVL. He is doing excellent from a cardiac standpoint and has NYHA class I symptoms, but mostly limited by knee pain due to advanced OA. Continue on Coumadin alone. SBE prophylaxis discussed; he has amoxicillin. Continue regular follow up with Dr. Irish Lack.   CHB s/p PPM: followed by Dr. Curt Bears.   Persistent afib: rate well controlled. Coumadin/INRs followed by PCP.    Chronic diastolic CHF: appears euvolemic. Continue lasix 68m PRN (has not been taking)  Right knee pain: walking with a crutch due to advanced OA in his knee. He has been reluctant to consider surgery.   PVCs: continue Toprol xl  Mitral valve disease: echo today  shows mod MR and mod to severe MS with a mean gradient ~7 mmHg at 50 bpm. Given LA dilation, severe pulmonary hypertension, suspect this may be severe. Consider stress echo to monitor gradients with increase HR/exercise. I do not think he would be a surgical candidate and doing well from a cardiac standpoint, so I will hold off on this for now. Additionally, he would not be able to walk on a treadmill with his knee. Further testing deferred to primary cardiologist.    Pancreatic cyst: pre TAVR CT showed Low-attenuation 1.3 cm focus in the pancreatic body. No biliary or pancreatic duct dilation. MRI abdomen without and with IV contrast recommended for further characterization. Follow up MRI on 12/22/20 was reassuring with concern to mass on pancreas. Looks like a small postinflammatory cystic lesion. No follow up recommended  Medication Adjustments/Labs and Tests Ordered: Current medicines are reviewed at length with the patient today.  Concerns regarding medicines are outlined above.  Medication changes, Labs and Tests ordered today are listed in the Patient Instructions below. Patient Instructions   Medication Instructions:  *If you need a refill on your cardiac medications before your next appointment, please call your pharmacy*  Lab Work: If you have labs (blood work) drawn today and your tests are completely normal, you will receive your results only by: [place] (if you have MyChart) OR A paper copy in the mail If you have any lab test that is abnormal or we need to change your treatment, we will call you to review the results.  Follow-Up: At [facility], you and your health needs are our priority.  As part of our continuing mission to provide you with exceptional heart care, we have created designated Provider Care Teams.  These Care Teams include your primary Cardiologist (physician) and Advanced Practice Providers (APPs -  Physician Assistants and Nurse Practitioners) who all work together to provide you with the care you need, when you need it.  We recommend signing up for the patient portal called "MyChart".  Sign up information is provided on this After Visit Summary.  MyChart is used to connect with patients for Virtual Visits (Telemedicine).  Patients are able to view lab/test results, encounter notes, upcoming appointments, etc.  Non-urgent messages can be sent to your provider as well.   To learn more about what you can do with MyChart, go to [email or web address].    Your next appointment:   October  The format for your next appointment:   In Person  Provider:   You may see Larae Grooms, MD or one of the following Advanced Practice Providers on your designated Care Team:   Melina Copa, PA-C Ermalinda Barrios, PA-C    Signed, Angelena Form, PA-C  10/20/2020 5:44 PM    [address], [place], [place]  [address] Phone: [phone]; Fax: [phone]

## 2020-10-20 NOTE — Patient Instructions (Signed)
Medication Instructions:  *If you need a refill on your cardiac medications before your next appointment, please call your pharmacy*  Lab Work: If you have labs (blood work) drawn today and your tests are completely normal, you will receive your results only by: [place] (if you have MyChart) OR A paper copy in the mail If you have any lab test that is abnormal or we need to change your treatment, we will call you to review the results.  Follow-Up: At [facility], you and your health needs are our priority.  As part of our continuing mission to provide you with exceptional heart care, we have created designated Provider Care Teams.  These Care Teams include your primary Cardiologist (physician) and Advanced Practice Providers (APPs -  Physician Assistants and Nurse Practitioners) who all work together to provide you with the care you need, when you need it.  We recommend signing up for the patient portal called "MyChart".  Sign up information is provided on this After Visit Summary.  MyChart is used to connect with patients for Virtual Visits (Telemedicine).  Patients are able to view lab/test results, encounter notes, upcoming appointments, etc.  Non-urgent messages can be sent to your provider as well.   To learn more about what you can do with MyChart, go to [email or web address].    Your next appointment:   October  The format for your next appointment:   In Person  Provider:   You may see Larae Grooms, MD or one of the following Advanced Practice Providers on your designated Care Team:   Melina Copa, PA-C Ermalinda Barrios, PA-C

## 2020-11-09 DIAGNOSIS — Z7901 Long term (current) use of anticoagulants: Secondary | ICD-10-CM | POA: Diagnosis not present

## 2020-11-30 DIAGNOSIS — H353132 Nonexudative age-related macular degeneration, bilateral, intermediate dry stage: Secondary | ICD-10-CM | POA: Diagnosis not present

## 2020-12-10 DIAGNOSIS — Z7901 Long term (current) use of anticoagulants: Secondary | ICD-10-CM | POA: Diagnosis not present

## 2020-12-10 DIAGNOSIS — I4891 Unspecified atrial fibrillation: Secondary | ICD-10-CM | POA: Diagnosis not present

## 2020-12-16 ENCOUNTER — Ambulatory Visit (INDEPENDENT_AMBULATORY_CARE_PROVIDER_SITE_OTHER): Payer: PPO

## 2020-12-16 DIAGNOSIS — M1711 Unilateral primary osteoarthritis, right knee: Secondary | ICD-10-CM | POA: Diagnosis not present

## 2020-12-16 DIAGNOSIS — E7800 Pure hypercholesterolemia, unspecified: Secondary | ICD-10-CM | POA: Diagnosis not present

## 2020-12-16 DIAGNOSIS — I10 Essential (primary) hypertension: Secondary | ICD-10-CM | POA: Diagnosis not present

## 2020-12-16 DIAGNOSIS — E1140 Type 2 diabetes mellitus with diabetic neuropathy, unspecified: Secondary | ICD-10-CM | POA: Diagnosis not present

## 2020-12-16 DIAGNOSIS — I442 Atrioventricular block, complete: Secondary | ICD-10-CM | POA: Diagnosis not present

## 2020-12-16 DIAGNOSIS — I4891 Unspecified atrial fibrillation: Secondary | ICD-10-CM | POA: Diagnosis not present

## 2020-12-16 DIAGNOSIS — D649 Anemia, unspecified: Secondary | ICD-10-CM | POA: Diagnosis not present

## 2020-12-16 LAB — CUP PACEART REMOTE DEVICE CHECK
Battery Remaining Longevity: 129 mo
Battery Voltage: 3.02 V
Brady Statistic RV Percent Paced: 92.94 %
Date Time Interrogation Session: 20220915010950
Implantable Lead Implant Date: 20210616
Implantable Lead Location: 753860
Implantable Lead Model: 5076
Implantable Pulse Generator Implant Date: 20210616
Lead Channel Impedance Value: 437 Ohm
Lead Channel Impedance Value: 513 Ohm
Lead Channel Pacing Threshold Amplitude: 0.5 V
Lead Channel Pacing Threshold Pulse Width: 0.4 ms
Lead Channel Sensing Intrinsic Amplitude: 7.5 mV
Lead Channel Sensing Intrinsic Amplitude: 7.5 mV
Lead Channel Setting Pacing Amplitude: 2.5 V
Lead Channel Setting Pacing Pulse Width: 0.4 ms
Lead Channel Setting Sensing Sensitivity: 4 mV

## 2020-12-23 NOTE — Progress Notes (Signed)
Remote pacemaker transmission.   

## 2020-12-24 DIAGNOSIS — L089 Local infection of the skin and subcutaneous tissue, unspecified: Secondary | ICD-10-CM | POA: Diagnosis not present

## 2020-12-24 DIAGNOSIS — L255 Unspecified contact dermatitis due to plants, except food: Secondary | ICD-10-CM | POA: Diagnosis not present

## 2021-01-24 DIAGNOSIS — E7800 Pure hypercholesterolemia, unspecified: Secondary | ICD-10-CM | POA: Diagnosis not present

## 2021-01-24 DIAGNOSIS — Z23 Encounter for immunization: Secondary | ICD-10-CM | POA: Diagnosis not present

## 2021-01-24 DIAGNOSIS — N400 Enlarged prostate without lower urinary tract symptoms: Secondary | ICD-10-CM | POA: Diagnosis not present

## 2021-01-24 DIAGNOSIS — I10 Essential (primary) hypertension: Secondary | ICD-10-CM | POA: Diagnosis not present

## 2021-01-24 DIAGNOSIS — E538 Deficiency of other specified B group vitamins: Secondary | ICD-10-CM | POA: Diagnosis not present

## 2021-01-24 DIAGNOSIS — D6869 Other thrombophilia: Secondary | ICD-10-CM | POA: Diagnosis not present

## 2021-01-24 DIAGNOSIS — E1140 Type 2 diabetes mellitus with diabetic neuropathy, unspecified: Secondary | ICD-10-CM | POA: Diagnosis not present

## 2021-01-24 DIAGNOSIS — I4891 Unspecified atrial fibrillation: Secondary | ICD-10-CM | POA: Diagnosis not present

## 2021-01-24 DIAGNOSIS — D480 Neoplasm of uncertain behavior of bone and articular cartilage: Secondary | ICD-10-CM | POA: Diagnosis not present

## 2021-01-24 DIAGNOSIS — D649 Anemia, unspecified: Secondary | ICD-10-CM | POA: Diagnosis not present

## 2021-01-24 DIAGNOSIS — D692 Other nonthrombocytopenic purpura: Secondary | ICD-10-CM | POA: Diagnosis not present

## 2021-02-15 DIAGNOSIS — Z1389 Encounter for screening for other disorder: Secondary | ICD-10-CM | POA: Diagnosis not present

## 2021-02-15 DIAGNOSIS — Z0000 Encounter for general adult medical examination without abnormal findings: Secondary | ICD-10-CM | POA: Diagnosis not present

## 2021-02-16 DIAGNOSIS — C44319 Basal cell carcinoma of skin of other parts of face: Secondary | ICD-10-CM | POA: Diagnosis not present

## 2021-02-16 DIAGNOSIS — X32XXXA Exposure to sunlight, initial encounter: Secondary | ICD-10-CM | POA: Diagnosis not present

## 2021-02-16 DIAGNOSIS — L570 Actinic keratosis: Secondary | ICD-10-CM | POA: Diagnosis not present

## 2021-02-20 NOTE — Progress Notes (Signed)
Cardiology Office Note    Date:  02/22/2021   ID:  Gregory [name], DOB [birth year], MRN [id number]  PCP:  Gregory Lass, MD  Cardiologist:  Gregory Grooms, MD  Electrophysiologist:  Gregory Haw, MD   Chief Complaint: f/u TAVR, valve disease, CHF  History of Present Illness:   Gregory [name] is a 80 y.o. male with history of permanent atrial fibrillation on coumadin, RBBB, chronic diastolic CHF, Non Hodgkins lymphoma, DM, HTN, prior peptic ulcer, severe MAC with mod MS/MR and severe aortic stenosis s/p TAVR (09/16/19) c/b CHB s/p PPM (09/17/19), chronic diastolic CHF, pulmonary HTN, PVCs, severe OSA (not on CPAP), pancreatic cyst (felt to be reassuring on f/u imaging) who presents for follow-up.  He has been followed by Dr. Irish [name] long term for his atrial fib and valvular disease. In 2021 he progressed to severe AS and underwent cath 08/2019 without significant CAD. He underwent dental extractions then TAVR 09/2019. Post-op course complicated by CHB requiring Medtronic Azure XT DR MRI SureScan pacemaker 09/17/19. Of note, the pt had left sided subclavian occlusion and required PM implantation on the right side. He was resumed on coumadin with the addition of a baby aspirin x 6 months. F/u echo 10/2020 showed EF 55-60%, mild LVH, severely elevated PA pressure, severe LAE, mild-moderate LAE, moderate MR, moderate-severe mitral stenosis, normal TAVR prosthetic function, dilated IVC, no significant change from prior post-TAVR echo 10/2019. On result note from echo, Gregory [name] stated, "There was mod MR and mod to severe MS with a mean gradient ~7 mmHg at 50 bpm. Per Dr. Harrell Gave, "given LA dilation, severe pulmonary hypertension, suspect this may be severe. Consider stress echo to monitor gradients with increase HR/exercise." I do not think he would be a surgical candidate and doing well from a cardiac standpoint, so I will hold off on this for now. Further testing deferred to primary  cardiologist. "  He is seen back for follow-up and overall reports feeling stable without any new dyspnea, chest pain, orthopnea. He has mild intermittent edema for which he uses Lasix PRN, not significantly present on exam today. He has a home health nurse come out periodically from his insurance company. INRs are followed by PCP. His activity is primarily limited by right knee instability/discomfort - he plans to see orthopedics for this. He has ill-fitting dentures and plans to seek out implants at some point. 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 He states when he was working he was diagnosed with severe OSA but could not tolerate CPAP.  Labwork independently reviewed: KPN 01/2021 K 5.3, Cr 0.870, LDL 55, trig 106, A1C 7.7, INR 2.4, ALT wnl Scan 12/2019 Hgb 12.5 Plt 190, K 4.5, albumin 4.1, Cr 0.87, LFTs ok, LDL 49 (mg'd by PCP)   Past Medical History:  Diagnosis Date   Amputation finger    Distal 4th & 5th digits of right hand   Arthritis    Chronic atrial fibrillation (HCC)    Chronic diastolic CHF (congestive heart failure) (HCC)    Diabetes mellitus type 2, noninsulin dependent ([facility])    History of blood transfusion    5 pints for peptic ulcer   History of kidney stones    Passed x 2   Hypertension    Mitral valve disease    mild-moderate MR, moderate MS 07/15/19 echo   Other malignant lymphomas, unspecified site, extranodal and solid organ sites    Non Hodkins Lymothopathy  Ruptured disk    S/P TAVR (transcatheter aortic valve replacement) 09/16/2019   s/p TAVR with a 29 mm Edwards Sapien 3 THV via the TF approach by Dr. Burt Knack and Dr. Cyndia Bent   Severe aortic stenosis    Ulcer    peptic Ulcer - bleeding    Past Surgical History:  Procedure Laterality Date   APPENDECTOMY  1967   COLONOSCOPY     left knee surgery     left shoulder surgery     MULTIPLE EXTRACTIONS WITH ALVEOLOPLASTY N/A 09/04/2019   Procedure:  Extraction of tooth #'s 2,7,8,9,11,13,14 20,30, and 31 with alveoloplatsty and gross debridement of remaining teeth;  Surgeon: Lenn Cal, DDS;  Location: [facility];  Service: Oral Surgery;  Laterality: N/A;   PACEMAKER IMPLANT N/A 09/17/2019   Procedure: PACEMAKER IMPLANT;  Surgeon: Gregory Haw, MD;  Location: [facility];  Service: Cardiovascular;  Laterality: N/A;   RIGHT/LEFT HEART CATH AND CORONARY ANGIOGRAPHY N/A 08/12/2019   Procedure: RIGHT/LEFT HEART CATH AND CORONARY ANGIOGRAPHY;  Surgeon: Jettie Booze, MD;  Location: [facility];  Service: Cardiovascular;  Laterality: N/A;   TEE WITHOUT CARDIOVERSION N/A 09/16/2019   Procedure: TRANSESOPHAGEAL ECHOCARDIOGRAM (TEE);  Surgeon: Sherren Mocha, MD;  Location: [facility];  Service: Open Heart Surgery;  Laterality: N/A;   TONSILLECTOMY     TRANSCATHETER AORTIC VALVE REPLACEMENT, TRANSFEMORAL N/A 09/16/2019   Procedure: TRANSCATHETER AORTIC VALVE REPLACEMENT, TRANSFEMORAL;  Surgeon: Sherren Mocha, MD;  Location: [facility];  Service: Open Heart Surgery;  Laterality: N/A;    Current Medications: Current Meds  Medication Sig   atorvastatin (LIPITOR) 20 MG tablet Take 20 mg by mouth daily at 6 PM.    cyanocobalamin 1000 MCG tablet Take 2,000 mcg by mouth daily.   diltiazem (CARDIZEM CD) 120 MG 24 hr capsule Take 120 mg by mouth daily at 6 PM.   ferrous sulfate 325 (65 FE) MG tablet Take 325 mg by mouth daily with breakfast.   furosemide (LASIX) 40 MG tablet Take 1 tablet (40 mg total) by mouth daily as needed. For swelling   lisinopril (PRINIVIL,ZESTRIL) 10 MG tablet Take 10 mg by mouth daily.   metFORMIN (GLUCOPHAGE-XR) 500 MG 24 hr tablet Take 1 tablet (500 mg total) by mouth 2 (two) times daily.   metoprolol succinate (TOPROL-XL) 100 MG 24 hr tablet Take 100 mg by mouth daily. Take with or immediately following a meal.   Multiple Vitamins-Minerals (PRESERVISION AREDS 2 PO) Take 1 tablet by  mouth daily.   ONE TOUCH ULTRA TEST test strip 1 each by Other route as needed (BLOOD SUGAR).    ONETOUCH DELICA LANCETS 00Q MISC Apply 1 each topically daily as needed (BLOOD SUGAR).    pioglitazone (ACTOS) 15 MG tablet Take 15 mg by mouth daily.    tamsulosin (FLOMAX) 0.4 MG CAPS capsule Take 0.4 mg by mouth at bedtime.   warfarin (COUMADIN) 6 MG tablet Take 3-6 mg by mouth See admin instructions. Take 0.5 tablet (3 mg) by mouth on Thursdays & Saturdays & take 1 tablet (6 mg) by mouth on Sundays, Mondays, Tuesdays, Wednesdays, & Fridays at night.     Allergies:   Aspirin, Macrobid [nitrofurantoin], Nystatin, Prevnar 13 [pneumococcal 13-val conj vacc], Bisoprolol-hydrochlorothiazide, and Nsaids   Social History   Socioeconomic History   Marital status: Single    Spouse name: Not on file   Number of children: 0   Years of education: Not on file   Highest  education level: Not on file  Occupational History   Occupation: Retired-Trucking  Tobacco Use   Smoking status: Never   Smokeless tobacco: Never  Vaping Use   Vaping Use: Never used  Substance and Sexual Activity   Alcohol use: No   Drug use: No   Sexual activity: Never  Other Topics Concern   Not on file  Social History Narrative   Not on file   Social Determinants of Health   Financial Resource Strain: Not on file  Food Insecurity: Not on file  Transportation Needs: Not on file  Physical Activity: Not on file  Stress: Not on file  Social Connections: Not on file     Family History:  The patient's family history includes Cancer in his mother; Heart attack in his father and mother; Hypertension in his father. There is no history of Stroke.  ROS:   Please see the history of present illness.  All other systems are reviewed and otherwise negative.    EKGs/Labs/Other Studies Reviewed:    Studies reviewed are outlined and summarized above. Reports included below if pertinent.  2D echo 10/2020    1. Left  ventricular ejection fraction, by estimation, is 55 to 60%. The  left ventricle has normal function. The left ventricle has no regional  wall motion abnormalities. There is mild concentric left ventricular  hypertrophy. Left ventricular diastolic  function could not be evaluated.   2. Right ventricular systolic function is normal. The right ventricular  size is normal. There is severely elevated pulmonary artery systolic  pressure. The estimated right ventricular systolic pressure is 09.4 mmHg.   3. Left atrial size was severely dilated.   4. Right atrial size was mild to moderately dilated.   5. The mitral valve is abnormal. Moderate mitral valve regurgitation.  Moderate to severe mitral stenosis. The mean mitral valve gradient is 7.3  mmHg with average heart rate of 50 bpm. Severe mitral annular  calcification.   6. The aortic valve has been repaired/replaced. Aortic valve  regurgitation is trivial. There is a 29 mm Sapien prosthetic (TAVR) valve  present in the aortic position. Procedure Date: 09/16/2019. Echo findings  are consistent with normal structure and  function of the aortic valve prosthesis. Aortic valve area, by VTI  measures 2.43 cm. Aortic valve mean gradient measures 5.6 mmHg. Aortic  valve Vmax measures 1.59 m/s.   7. The inferior vena cava is dilated in size with <50% respiratory  variability, suggesting right atrial pressure of 15 mmHg.   Comparison(s): No significant change from prior study. Prior images  reviewed side by side.   Conclusion(s)/Recommendation(s): Moderate to severe mitral stenosis. Mean  gradient ~7 mmHg at 50 bpm. Given LA dilation, severe pulmonary  hypertension, suspect this may be severe. Consider stress echo to monitor  gradients with increase HR/exercise.   Cath 08/2019 The left ventricular systolic function is normal. LV end diastolic pressure is normal. The left ventricular ejection fraction is 55-65% by visual estimate. There is severe  aortic valve stenosis. Mean gradient 46 mm Hg. There is mild mitral valve stenosis. Mitral valve area 2.1 cm2. Mean gradient 11 mm Hg. Hemodynamic findings consistent with mild pulmonary hypertension. No significant CAD.   No significant CAD.  Plan for TAVR w/u.      EKG:  EKG is ordered today, personally reviewed, demonstrating v paced rhythm 86bpm, suspect underlying afib  Recent Labs: No results found for requested labs within last 8760 hours.  Recent Lipid Panel No results found  for: CHOL, TRIG, HDL, CHOLHDL, VLDL, LDLCALC, LDLDIRECT  PHYSICAL EXAM:    VS:  BP 124/84   Pulse 86   Ht _0  (1.778 m)   Wt 257 lb 3.2 oz (116.7 kg)   SpO2 98%   BMI 36.90 kg/m   BMI: Body mass index is 36.9 kg/m.  GEN: Well nourished, well developed male in no acute distress HEENT: normocephalic, atraumatic Neck: no JVD, carotid bruits, or masses Cardiac: RRR; +SEM at apex, no rubs or gallops, chronic venous stasis skin thickening but no significant LE edema  Respiratory:  clear to auscultation bilaterally, normal work of breathing GI: soft, nontender, nondistended, + BS MS: no deformity or atrophy Skin: warm and dry, no rash Neuro:  Alert and Oriented x 3, Strength and sensation are intact, follows commands Psych: euthymic mood, full affect  Wt Readings from Last 3 Encounters:  02/22/21 257 lb 3.2 oz (116.7 kg)  10/20/20 247 lb 12.8 oz (112.4 kg)  09/24/20 246 lb (111.6 kg)     ASSESSMENT & PLAN:   1. Severe AS s/p TAVR 2021, also with residual mitral valve disease - clinically stable without accelerating symptoms. Last echo reviewed as above with stable TAVR, also with moderate MR and moderate-severe mitral stenosis. K. Grandville Silos PA-C did not feel he would be a surgical candidate for residual disease and given that he was doing well from a cardiac standpoint, did not feel any additional testing was needed at last OV. Additionally, he cannot walk on a treadmill due to knee problems so  cannot perform stress echo at this time. I will discuss his echo findings, along with notation of severe pulmonary HTN, with Dr. Irish [name].  2. Chronic diastolic CHF with pulmonary HTN by last echo - weight is up from prior OV but appears euvolemic on exam, uses Lasix PRN. Very sedentary due to chronic knee issues. Etiology of severe pulmonary HTN is not previously outlined, suspect multifactorial. He reports hx of severe OSA but unable to tolerate CPAP - he does not wish to revisit testing/management at this time. I suspect the combination of OSA, valvular disease and diastolic HF is contributing. Denies hx of pulmonary disease. Continue Lasix as needed. I will review echocardiogram with Dr. Irish [name] as above to inquire whether any additional testing is needed at this time as patient is clinically stable without any progressive symptoms.  3. CHB s/p PPM - continue follow-up with EP team. He inquires about whether he should have a CT or MRI on his knee. I told him to discuss with his orthopedic team and if they feel he would benefit from MRI, to reach out to our device team to clarify whether his PPM is compatible.  4. Permanent atrial fib, also hx of PVCs - asymptomatic, heart rate controlled. He has been historically maintained on Coumadin and is pleased with this regimen as opposed to switching to NOAC/DOAC - has mitral stenosis so appropriate to continue Coumadin. INR followed by PCP.     Disposition: F/u with Dr. Irish [name] in 6 months. Also continue usual EP f/u as previously recommended.   Medication Adjustments/Labs and Tests Ordered: Current medicines are reviewed at length with the patient today.  Concerns regarding medicines are outlined above. Medication changes, Labs and Tests ordered today are summarized above and listed in the Patient Instructions accessible in Encounters.   Signed, Charlie Pitter, PA-C  02/22/2021 2:40 PM    [facility] [place],  [place], [place]  [address] Phone: [phone]; Fax: [phone]

## 2021-02-22 ENCOUNTER — Encounter: Payer: Self-pay | Admitting: Physician Assistant

## 2021-02-22 ENCOUNTER — Other Ambulatory Visit: Payer: Self-pay

## 2021-02-22 ENCOUNTER — Ambulatory Visit: Payer: PPO | Admitting: Physician Assistant

## 2021-02-22 VITALS — BP 124/84 | HR 86 | Ht 70.0 in | Wt 257.2 lb

## 2021-02-22 DIAGNOSIS — I442 Atrioventricular block, complete: Secondary | ICD-10-CM | POA: Diagnosis not present

## 2021-02-22 DIAGNOSIS — I059 Rheumatic mitral valve disease, unspecified: Secondary | ICD-10-CM

## 2021-02-22 DIAGNOSIS — I493 Ventricular premature depolarization: Secondary | ICD-10-CM

## 2021-02-22 DIAGNOSIS — I4821 Permanent atrial fibrillation: Secondary | ICD-10-CM

## 2021-02-22 DIAGNOSIS — Z952 Presence of prosthetic heart valve: Secondary | ICD-10-CM | POA: Diagnosis not present

## 2021-02-22 DIAGNOSIS — I350 Nonrheumatic aortic (valve) stenosis: Secondary | ICD-10-CM

## 2021-02-22 DIAGNOSIS — I5032 Chronic diastolic (congestive) heart failure: Secondary | ICD-10-CM

## 2021-02-22 DIAGNOSIS — I272 Pulmonary hypertension, unspecified: Secondary | ICD-10-CM

## 2021-02-22 NOTE — Patient Instructions (Addendum)
Medication Instructions:  Your physician recommends that you continue on your current medications as directed. Please refer to the Current Medication list given to you today.  *If you need a refill on your cardiac medications before your next appointment, please call your pharmacy*   Lab Work: None ordered  If you have labs (blood work) drawn today and your tests are completely normal, you will receive your results only by: [place] (if you have MyChart) OR A paper copy in the mail If you have any lab test that is abnormal or we need to change your treatment, we will call you to review the results.   Testing/Procedures: None ordered   Follow-Up: At [facility], you and your health needs are our priority.  As part of our continuing mission to provide you with exceptional heart care, we have created designated Provider Care Teams.  These Care Teams include your primary Cardiologist (physician) and Advanced Practice Providers (APPs -  Physician Assistants and Nurse Practitioners) who all work together to provide you with the care you need, when you need it.  We recommend signing up for the patient portal called "MyChart".  Sign up information is provided on this After Visit Summary.  MyChart is used to connect with patients for Virtual Visits (Telemedicine).  Patients are able to view lab/test results, encounter notes, upcoming appointments, etc.  Non-urgent messages can be sent to your provider as well.   To learn more about what you can do with MyChart, go to [email or web address].    Your next appointment:   6 month(s)  The format for your next appointment:   In Person  Provider:   Larae Grooms, MD    Other Instructions   Endocarditis Information  You may be at risk for developing endocarditis since you have an artificial heart valve or a repaired heart valve. Endocarditis is an infection of the lining of the heart or heart valves. Certain surgical and dental  procedures may put you at risk, such as teeth cleaning or other dental procedures or other medical procedures. Notify our office or your dentist before having any dental work or invasive/surgical procedures. You will need to take antibiotics before certain procedures. To prevent endocarditis, maintain good oral health. Seek prompt medical attention for any mouth/gum, skin or urinary tract infections.

## 2021-03-03 ENCOUNTER — Telehealth: Payer: Self-pay | Admitting: Physician Assistant

## 2021-03-03 DIAGNOSIS — I059 Rheumatic mitral valve disease, unspecified: Secondary | ICD-10-CM

## 2021-03-03 NOTE — Telephone Encounter (Signed)
   Please call patient and let him know I talked to Dr. Irish Lack and Dr. Burt Knack about his last echo findings (he has narrowing of mitral valve and elevated blood pressures in lungs). Both agree they would not change management at this time. Dr. Burt Knack did suggest he would do better if he would move forward with CPAP given history of severe sleep apnea but patient may not be interested in this (he wasn't in recent clinic OV) - would need repeat sleep study as this was remotely diagnosed. Dr. Burt Knack recommends repeat echo in 6 months - can we try to schedule this before his next f/u with Dr. Irish Lack in 6 months? Dx is mitral valve disease, pulmonary HTN and history of TAVR. Thanks! Ruhama Lehew

## 2021-03-03 NOTE — Telephone Encounter (Signed)
Outreach made to Pt.  Pt advised of echo results.  Encouraged Pt to repeat sleep study.    Order placed for echo in 6 months.  Pt will see Dr. Clayton Bibles after echo (put in visit notes)

## 2021-03-14 DIAGNOSIS — M1711 Unilateral primary osteoarthritis, right knee: Secondary | ICD-10-CM | POA: Diagnosis not present

## 2021-03-14 DIAGNOSIS — I4891 Unspecified atrial fibrillation: Secondary | ICD-10-CM | POA: Diagnosis not present

## 2021-03-14 DIAGNOSIS — E7800 Pure hypercholesterolemia, unspecified: Secondary | ICD-10-CM | POA: Diagnosis not present

## 2021-03-14 DIAGNOSIS — D649 Anemia, unspecified: Secondary | ICD-10-CM | POA: Diagnosis not present

## 2021-03-14 DIAGNOSIS — N400 Enlarged prostate without lower urinary tract symptoms: Secondary | ICD-10-CM | POA: Diagnosis not present

## 2021-03-14 DIAGNOSIS — E1140 Type 2 diabetes mellitus with diabetic neuropathy, unspecified: Secondary | ICD-10-CM | POA: Diagnosis not present

## 2021-03-14 DIAGNOSIS — I10 Essential (primary) hypertension: Secondary | ICD-10-CM | POA: Diagnosis not present

## 2021-03-16 DIAGNOSIS — C44612 Basal cell carcinoma of skin of right upper limb, including shoulder: Secondary | ICD-10-CM | POA: Diagnosis not present

## 2021-03-16 DIAGNOSIS — Z85828 Personal history of other malignant neoplasm of skin: Secondary | ICD-10-CM | POA: Diagnosis not present

## 2021-03-16 DIAGNOSIS — C44619 Basal cell carcinoma of skin of left upper limb, including shoulder: Secondary | ICD-10-CM | POA: Diagnosis not present

## 2021-03-16 DIAGNOSIS — Z08 Encounter for follow-up examination after completed treatment for malignant neoplasm: Secondary | ICD-10-CM | POA: Diagnosis not present

## 2021-03-17 ENCOUNTER — Ambulatory Visit (INDEPENDENT_AMBULATORY_CARE_PROVIDER_SITE_OTHER): Payer: PPO

## 2021-03-17 DIAGNOSIS — I442 Atrioventricular block, complete: Secondary | ICD-10-CM | POA: Diagnosis not present

## 2021-03-17 LAB — CUP PACEART REMOTE DEVICE CHECK
Battery Remaining Longevity: 126 mo
Battery Voltage: 3.02 V
Brady Statistic RV Percent Paced: 83.1 %
Date Time Interrogation Session: 20221214224933
Implantable Lead Implant Date: 20210616
Implantable Lead Location: 753860
Implantable Lead Model: 5076
Implantable Pulse Generator Implant Date: 20210616
Lead Channel Impedance Value: 399 Ohm
Lead Channel Impedance Value: 494 Ohm
Lead Channel Pacing Threshold Amplitude: 0.5 V
Lead Channel Pacing Threshold Pulse Width: 0.4 ms
Lead Channel Sensing Intrinsic Amplitude: 7 mV
Lead Channel Sensing Intrinsic Amplitude: 7 mV
Lead Channel Setting Pacing Amplitude: 2.5 V
Lead Channel Setting Pacing Pulse Width: 0.4 ms
Lead Channel Setting Sensing Sensitivity: 4 mV

## 2021-03-29 NOTE — Progress Notes (Signed)
Remote pacemaker transmission.   

## 2021-04-08 DIAGNOSIS — I5032 Chronic diastolic (congestive) heart failure: Secondary | ICD-10-CM | POA: Diagnosis not present

## 2021-04-08 DIAGNOSIS — D692 Other nonthrombocytopenic purpura: Secondary | ICD-10-CM | POA: Diagnosis not present

## 2021-04-08 DIAGNOSIS — I110 Hypertensive heart disease with heart failure: Secondary | ICD-10-CM | POA: Diagnosis not present

## 2021-04-08 DIAGNOSIS — Z7984 Long term (current) use of oral hypoglycemic drugs: Secondary | ICD-10-CM | POA: Diagnosis not present

## 2021-04-08 DIAGNOSIS — Z7901 Long term (current) use of anticoagulants: Secondary | ICD-10-CM | POA: Diagnosis not present

## 2021-04-08 DIAGNOSIS — E119 Type 2 diabetes mellitus without complications: Secondary | ICD-10-CM | POA: Diagnosis not present

## 2021-04-08 DIAGNOSIS — I4819 Other persistent atrial fibrillation: Secondary | ICD-10-CM | POA: Diagnosis not present

## 2021-04-08 DIAGNOSIS — Z6841 Body Mass Index (BMI) 40.0 and over, adult: Secondary | ICD-10-CM | POA: Diagnosis not present

## 2021-04-08 DIAGNOSIS — D6859 Other primary thrombophilia: Secondary | ICD-10-CM | POA: Diagnosis not present

## 2021-04-08 DIAGNOSIS — Z950 Presence of cardiac pacemaker: Secondary | ICD-10-CM | POA: Diagnosis not present

## 2021-04-08 DIAGNOSIS — Z89111 Acquired absence of right hand: Secondary | ICD-10-CM | POA: Diagnosis not present

## 2021-04-26 DIAGNOSIS — Z7901 Long term (current) use of anticoagulants: Secondary | ICD-10-CM | POA: Diagnosis not present

## 2021-04-26 DIAGNOSIS — I4891 Unspecified atrial fibrillation: Secondary | ICD-10-CM | POA: Diagnosis not present

## 2021-05-04 DIAGNOSIS — Z7901 Long term (current) use of anticoagulants: Secondary | ICD-10-CM | POA: Diagnosis not present

## 2021-05-04 DIAGNOSIS — I4891 Unspecified atrial fibrillation: Secondary | ICD-10-CM | POA: Diagnosis not present

## 2021-05-09 DIAGNOSIS — I4891 Unspecified atrial fibrillation: Secondary | ICD-10-CM | POA: Diagnosis not present

## 2021-05-09 DIAGNOSIS — Z7901 Long term (current) use of anticoagulants: Secondary | ICD-10-CM | POA: Diagnosis not present

## 2021-05-16 DIAGNOSIS — I4891 Unspecified atrial fibrillation: Secondary | ICD-10-CM | POA: Diagnosis not present

## 2021-05-16 DIAGNOSIS — Z7901 Long term (current) use of anticoagulants: Secondary | ICD-10-CM | POA: Diagnosis not present

## 2021-05-30 DIAGNOSIS — I5032 Chronic diastolic (congestive) heart failure: Secondary | ICD-10-CM | POA: Diagnosis not present

## 2021-05-30 DIAGNOSIS — E1140 Type 2 diabetes mellitus with diabetic neuropathy, unspecified: Secondary | ICD-10-CM | POA: Diagnosis not present

## 2021-05-30 DIAGNOSIS — I272 Pulmonary hypertension, unspecified: Secondary | ICD-10-CM | POA: Diagnosis not present

## 2021-05-30 DIAGNOSIS — E7800 Pure hypercholesterolemia, unspecified: Secondary | ICD-10-CM | POA: Diagnosis not present

## 2021-05-31 DIAGNOSIS — Z7901 Long term (current) use of anticoagulants: Secondary | ICD-10-CM | POA: Diagnosis not present

## 2021-05-31 DIAGNOSIS — I4821 Permanent atrial fibrillation: Secondary | ICD-10-CM | POA: Diagnosis not present

## 2021-06-09 DIAGNOSIS — I10 Essential (primary) hypertension: Secondary | ICD-10-CM | POA: Diagnosis not present

## 2021-06-09 DIAGNOSIS — H353132 Nonexudative age-related macular degeneration, bilateral, intermediate dry stage: Secondary | ICD-10-CM | POA: Diagnosis not present

## 2021-06-09 DIAGNOSIS — I5032 Chronic diastolic (congestive) heart failure: Secondary | ICD-10-CM | POA: Diagnosis not present

## 2021-06-09 DIAGNOSIS — E7800 Pure hypercholesterolemia, unspecified: Secondary | ICD-10-CM | POA: Diagnosis not present

## 2021-06-09 DIAGNOSIS — E119 Type 2 diabetes mellitus without complications: Secondary | ICD-10-CM | POA: Diagnosis not present

## 2021-06-16 ENCOUNTER — Ambulatory Visit (INDEPENDENT_AMBULATORY_CARE_PROVIDER_SITE_OTHER): Payer: PPO

## 2021-06-16 DIAGNOSIS — I442 Atrioventricular block, complete: Secondary | ICD-10-CM | POA: Diagnosis not present

## 2021-06-16 LAB — CUP PACEART REMOTE DEVICE CHECK
Battery Remaining Longevity: 121 mo
Battery Voltage: 3.02 V
Brady Statistic RV Percent Paced: 79.96 %
Date Time Interrogation Session: 20230316022313
Implantable Lead Implant Date: 20210616
Implantable Lead Location: 753860
Implantable Lead Model: 5076
Implantable Pulse Generator Implant Date: 20210616
Lead Channel Impedance Value: 380 Ohm
Lead Channel Impedance Value: 456 Ohm
Lead Channel Pacing Threshold Amplitude: 0.5 V
Lead Channel Pacing Threshold Pulse Width: 0.4 ms
Lead Channel Sensing Intrinsic Amplitude: 6.75 mV
Lead Channel Sensing Intrinsic Amplitude: 6.75 mV
Lead Channel Setting Pacing Amplitude: 2.5 V
Lead Channel Setting Pacing Pulse Width: 0.4 ms
Lead Channel Setting Sensing Sensitivity: 4 mV

## 2021-06-24 NOTE — Progress Notes (Signed)
Remote pacemaker transmission.   

## 2021-06-28 DIAGNOSIS — I4821 Permanent atrial fibrillation: Secondary | ICD-10-CM | POA: Diagnosis not present

## 2021-06-28 DIAGNOSIS — Z7901 Long term (current) use of anticoagulants: Secondary | ICD-10-CM | POA: Diagnosis not present

## 2021-07-12 DIAGNOSIS — I739 Peripheral vascular disease, unspecified: Secondary | ICD-10-CM | POA: Diagnosis not present

## 2021-07-12 DIAGNOSIS — Z89021 Acquired absence of right finger(s): Secondary | ICD-10-CM | POA: Diagnosis not present

## 2021-07-12 DIAGNOSIS — Z6841 Body Mass Index (BMI) 40.0 and over, adult: Secondary | ICD-10-CM | POA: Diagnosis not present

## 2021-07-13 DIAGNOSIS — I4821 Permanent atrial fibrillation: Secondary | ICD-10-CM | POA: Diagnosis not present

## 2021-07-13 DIAGNOSIS — Z7901 Long term (current) use of anticoagulants: Secondary | ICD-10-CM | POA: Diagnosis not present

## 2021-07-21 DIAGNOSIS — I10 Essential (primary) hypertension: Secondary | ICD-10-CM | POA: Diagnosis not present

## 2021-07-21 DIAGNOSIS — I5032 Chronic diastolic (congestive) heart failure: Secondary | ICD-10-CM | POA: Diagnosis not present

## 2021-07-21 DIAGNOSIS — E7800 Pure hypercholesterolemia, unspecified: Secondary | ICD-10-CM | POA: Diagnosis not present

## 2021-07-21 DIAGNOSIS — E1140 Type 2 diabetes mellitus with diabetic neuropathy, unspecified: Secondary | ICD-10-CM | POA: Diagnosis not present

## 2021-07-27 DIAGNOSIS — Z7901 Long term (current) use of anticoagulants: Secondary | ICD-10-CM | POA: Diagnosis not present

## 2021-07-27 DIAGNOSIS — I4821 Permanent atrial fibrillation: Secondary | ICD-10-CM | POA: Diagnosis not present

## 2021-08-01 DIAGNOSIS — Z7901 Long term (current) use of anticoagulants: Secondary | ICD-10-CM | POA: Diagnosis not present

## 2021-08-01 DIAGNOSIS — I4821 Permanent atrial fibrillation: Secondary | ICD-10-CM | POA: Diagnosis not present

## 2021-08-08 DIAGNOSIS — I4821 Permanent atrial fibrillation: Secondary | ICD-10-CM | POA: Diagnosis not present

## 2021-08-08 DIAGNOSIS — Z7901 Long term (current) use of anticoagulants: Secondary | ICD-10-CM | POA: Diagnosis not present

## 2021-08-15 DIAGNOSIS — Z7901 Long term (current) use of anticoagulants: Secondary | ICD-10-CM | POA: Diagnosis not present

## 2021-08-15 DIAGNOSIS — I4821 Permanent atrial fibrillation: Secondary | ICD-10-CM | POA: Diagnosis not present

## 2021-09-01 ENCOUNTER — Ambulatory Visit (HOSPITAL_COMMUNITY): Payer: PPO

## 2021-09-05 DIAGNOSIS — Z7901 Long term (current) use of anticoagulants: Secondary | ICD-10-CM | POA: Diagnosis not present

## 2021-09-05 DIAGNOSIS — I4821 Permanent atrial fibrillation: Secondary | ICD-10-CM | POA: Diagnosis not present

## 2021-09-05 DIAGNOSIS — E1140 Type 2 diabetes mellitus with diabetic neuropathy, unspecified: Secondary | ICD-10-CM | POA: Diagnosis not present

## 2021-09-15 ENCOUNTER — Ambulatory Visit (INDEPENDENT_AMBULATORY_CARE_PROVIDER_SITE_OTHER): Payer: PPO

## 2021-09-15 DIAGNOSIS — I442 Atrioventricular block, complete: Secondary | ICD-10-CM

## 2021-09-15 LAB — CUP PACEART REMOTE DEVICE CHECK
Battery Remaining Longevity: 114 mo
Battery Voltage: 3.01 V
Brady Statistic RV Percent Paced: 99.43 %
Date Time Interrogation Session: 20230615024114
Implantable Lead Implant Date: 20210616
Implantable Lead Location: 753860
Implantable Lead Model: 5076
Implantable Pulse Generator Implant Date: 20210616
Lead Channel Impedance Value: 304 Ohm
Lead Channel Impedance Value: 399 Ohm
Lead Channel Pacing Threshold Amplitude: 0.5 V
Lead Channel Pacing Threshold Pulse Width: 0.4 ms
Lead Channel Sensing Intrinsic Amplitude: 6.625 mV
Lead Channel Sensing Intrinsic Amplitude: 6.625 mV
Lead Channel Setting Pacing Amplitude: 2.5 V
Lead Channel Setting Pacing Pulse Width: 0.4 ms
Lead Channel Setting Sensing Sensitivity: 4 mV

## 2021-09-22 ENCOUNTER — Ambulatory Visit (HOSPITAL_COMMUNITY): Payer: PPO | Attending: Cardiology

## 2021-09-22 DIAGNOSIS — I059 Rheumatic mitral valve disease, unspecified: Secondary | ICD-10-CM | POA: Diagnosis not present

## 2021-09-22 DIAGNOSIS — I342 Nonrheumatic mitral (valve) stenosis: Secondary | ICD-10-CM | POA: Diagnosis not present

## 2021-09-22 LAB — ECHOCARDIOGRAM COMPLETE
AR max vel: 1.75 cm2
AV Area VTI: 1.79 cm2
AV Area mean vel: 1.79 cm2
AV Mean grad: 9 mmHg
AV Peak grad: 17 mmHg
Ao pk vel: 2.06 m/s
Area-P 1/2: 1.17 cm2
MV M vel: 4.58 m/s
MV Peak grad: 83.9 mmHg
MV VTI: 1.24 cm2
P 1/2 time: 426 msec
Radius: 0.8 cm
S' Lateral: 4.6 cm

## 2021-09-27 ENCOUNTER — Other Ambulatory Visit: Payer: Self-pay

## 2021-09-27 ENCOUNTER — Encounter (HOSPITAL_COMMUNITY): Payer: Self-pay | Admitting: Emergency Medicine

## 2021-09-27 ENCOUNTER — Emergency Department (HOSPITAL_COMMUNITY): Payer: PPO

## 2021-09-27 ENCOUNTER — Inpatient Hospital Stay (HOSPITAL_COMMUNITY)
Admission: EM | Admit: 2021-09-27 | Discharge: 2021-10-04 | DRG: 291 | Disposition: A | Discharge status: Skilled Nursing Facility | Payer: PPO | Attending: Internal Medicine | Admitting: Internal Medicine

## 2021-09-27 DIAGNOSIS — I080 Rheumatic disorders of both mitral and aortic valves: Secondary | ICD-10-CM | POA: Diagnosis not present

## 2021-09-27 DIAGNOSIS — I5033 Acute on chronic diastolic (congestive) heart failure: Secondary | ICD-10-CM | POA: Diagnosis present

## 2021-09-27 DIAGNOSIS — Z9181 History of falling: Secondary | ICD-10-CM | POA: Diagnosis not present

## 2021-09-27 DIAGNOSIS — I340 Nonrheumatic mitral (valve) insufficiency: Secondary | ICD-10-CM | POA: Diagnosis not present

## 2021-09-27 DIAGNOSIS — Z952 Presence of prosthetic heart valve: Secondary | ICD-10-CM | POA: Diagnosis not present

## 2021-09-27 DIAGNOSIS — I482 Chronic atrial fibrillation, unspecified: Secondary | ICD-10-CM | POA: Diagnosis present

## 2021-09-27 DIAGNOSIS — I130 Hypertensive heart and chronic kidney disease with heart failure and stage 1 through stage 4 chronic kidney disease, or unspecified chronic kidney disease: Secondary | ICD-10-CM | POA: Diagnosis not present

## 2021-09-27 DIAGNOSIS — R350 Frequency of micturition: Secondary | ICD-10-CM | POA: Diagnosis not present

## 2021-09-27 DIAGNOSIS — Z7401 Bed confinement status: Secondary | ICD-10-CM | POA: Diagnosis not present

## 2021-09-27 DIAGNOSIS — Z801 Family history of malignant neoplasm of trachea, bronchus and lung: Secondary | ICD-10-CM

## 2021-09-27 DIAGNOSIS — Z950 Presence of cardiac pacemaker: Secondary | ICD-10-CM

## 2021-09-27 DIAGNOSIS — E875 Hyperkalemia: Secondary | ICD-10-CM | POA: Diagnosis present

## 2021-09-27 DIAGNOSIS — Z7901 Long term (current) use of anticoagulants: Secondary | ICD-10-CM | POA: Diagnosis not present

## 2021-09-27 DIAGNOSIS — Z91148 Patient's other noncompliance with medication regimen for other reason: Secondary | ICD-10-CM

## 2021-09-27 DIAGNOSIS — I442 Atrioventricular block, complete: Secondary | ICD-10-CM | POA: Diagnosis not present

## 2021-09-27 DIAGNOSIS — Z7984 Long term (current) use of oral hypoglycemic drugs: Secondary | ICD-10-CM

## 2021-09-27 DIAGNOSIS — G4733 Obstructive sleep apnea (adult) (pediatric): Secondary | ICD-10-CM | POA: Diagnosis present

## 2021-09-27 DIAGNOSIS — I5082 Biventricular heart failure: Secondary | ICD-10-CM | POA: Diagnosis not present

## 2021-09-27 DIAGNOSIS — R601 Generalized edema: Principal | ICD-10-CM

## 2021-09-27 DIAGNOSIS — I50813 Acute on chronic right heart failure: Secondary | ICD-10-CM | POA: Diagnosis not present

## 2021-09-27 DIAGNOSIS — C8190 Hodgkin lymphoma, unspecified, unspecified site: Secondary | ICD-10-CM | POA: Diagnosis not present

## 2021-09-27 DIAGNOSIS — I5031 Acute diastolic (congestive) heart failure: Secondary | ICD-10-CM | POA: Diagnosis not present

## 2021-09-27 DIAGNOSIS — R112 Nausea with vomiting, unspecified: Secondary | ICD-10-CM | POA: Diagnosis not present

## 2021-09-27 DIAGNOSIS — E739 Lactose intolerance, unspecified: Secondary | ICD-10-CM | POA: Diagnosis not present

## 2021-09-27 DIAGNOSIS — D631 Anemia in chronic kidney disease: Secondary | ICD-10-CM | POA: Diagnosis present

## 2021-09-27 DIAGNOSIS — Z8572 Personal history of non-Hodgkin lymphomas: Secondary | ICD-10-CM

## 2021-09-27 DIAGNOSIS — N390 Urinary tract infection, site not specified: Secondary | ICD-10-CM | POA: Diagnosis not present

## 2021-09-27 DIAGNOSIS — I4821 Permanent atrial fibrillation: Secondary | ICD-10-CM | POA: Diagnosis present

## 2021-09-27 DIAGNOSIS — M1711 Unilateral primary osteoarthritis, right knee: Secondary | ICD-10-CM | POA: Diagnosis not present

## 2021-09-27 DIAGNOSIS — M1990 Unspecified osteoarthritis, unspecified site: Secondary | ICD-10-CM | POA: Diagnosis not present

## 2021-09-27 DIAGNOSIS — I509 Heart failure, unspecified: Secondary | ICD-10-CM | POA: Diagnosis not present

## 2021-09-27 DIAGNOSIS — E1122 Type 2 diabetes mellitus with diabetic chronic kidney disease: Secondary | ICD-10-CM | POA: Diagnosis present

## 2021-09-27 DIAGNOSIS — E1165 Type 2 diabetes mellitus with hyperglycemia: Secondary | ICD-10-CM | POA: Diagnosis present

## 2021-09-27 DIAGNOSIS — I5032 Chronic diastolic (congestive) heart failure: Secondary | ICD-10-CM | POA: Diagnosis not present

## 2021-09-27 DIAGNOSIS — E785 Hyperlipidemia, unspecified: Secondary | ICD-10-CM | POA: Diagnosis present

## 2021-09-27 DIAGNOSIS — M2550 Pain in unspecified joint: Secondary | ICD-10-CM | POA: Diagnosis not present

## 2021-09-27 DIAGNOSIS — Z7189 Other specified counseling: Secondary | ICD-10-CM | POA: Diagnosis not present

## 2021-09-27 DIAGNOSIS — T501X6A Underdosing of loop [high-ceiling] diuretics, initial encounter: Secondary | ICD-10-CM | POA: Diagnosis present

## 2021-09-27 DIAGNOSIS — E119 Type 2 diabetes mellitus without complications: Secondary | ICD-10-CM | POA: Diagnosis not present

## 2021-09-27 DIAGNOSIS — I5043 Acute on chronic combined systolic (congestive) and diastolic (congestive) heart failure: Secondary | ICD-10-CM | POA: Diagnosis present

## 2021-09-27 DIAGNOSIS — I2721 Secondary pulmonary arterial hypertension: Secondary | ICD-10-CM | POA: Diagnosis present

## 2021-09-27 DIAGNOSIS — E669 Obesity, unspecified: Secondary | ICD-10-CM | POA: Diagnosis present

## 2021-09-27 DIAGNOSIS — I248 Other forms of acute ischemic heart disease: Secondary | ICD-10-CM | POA: Diagnosis not present

## 2021-09-27 DIAGNOSIS — N182 Chronic kidney disease, stage 2 (mild): Secondary | ICD-10-CM | POA: Diagnosis not present

## 2021-09-27 DIAGNOSIS — Z515 Encounter for palliative care: Secondary | ICD-10-CM | POA: Diagnosis not present

## 2021-09-27 DIAGNOSIS — B964 Proteus (mirabilis) (morganii) as the cause of diseases classified elsewhere: Secondary | ICD-10-CM | POA: Diagnosis not present

## 2021-09-27 DIAGNOSIS — R5381 Other malaise: Secondary | ICD-10-CM | POA: Diagnosis present

## 2021-09-27 DIAGNOSIS — Z953 Presence of xenogenic heart valve: Secondary | ICD-10-CM

## 2021-09-27 DIAGNOSIS — R609 Edema, unspecified: Secondary | ICD-10-CM | POA: Diagnosis not present

## 2021-09-27 DIAGNOSIS — Z8249 Family history of ischemic heart disease and other diseases of the circulatory system: Secondary | ICD-10-CM

## 2021-09-27 DIAGNOSIS — Z89021 Acquired absence of right finger(s): Secondary | ICD-10-CM | POA: Diagnosis not present

## 2021-09-27 DIAGNOSIS — I350 Nonrheumatic aortic (valve) stenosis: Secondary | ICD-10-CM | POA: Diagnosis not present

## 2021-09-27 DIAGNOSIS — Z66 Do not resuscitate: Secondary | ICD-10-CM | POA: Diagnosis present

## 2021-09-27 DIAGNOSIS — R0602 Shortness of breath: Secondary | ICD-10-CM | POA: Diagnosis not present

## 2021-09-27 DIAGNOSIS — I959 Hypotension, unspecified: Secondary | ICD-10-CM | POA: Diagnosis not present

## 2021-09-27 DIAGNOSIS — Z79899 Other long term (current) drug therapy: Secondary | ICD-10-CM

## 2021-09-27 DIAGNOSIS — I5022 Chronic systolic (congestive) heart failure: Secondary | ICD-10-CM | POA: Diagnosis not present

## 2021-09-27 DIAGNOSIS — I5020 Unspecified systolic (congestive) heart failure: Secondary | ICD-10-CM | POA: Diagnosis not present

## 2021-09-27 DIAGNOSIS — Z95828 Presence of other vascular implants and grafts: Secondary | ICD-10-CM | POA: Diagnosis not present

## 2021-09-27 LAB — COMPREHENSIVE METABOLIC PANEL
ALT: 19 U/L (ref 0–44)
AST: 28 U/L (ref 15–41)
Albumin: 3.3 g/dL — ABNORMAL LOW (ref 3.5–5.0)
Alkaline Phosphatase: 120 U/L (ref 38–126)
Anion gap: 10 (ref 5–15)
BUN: 39 mg/dL — ABNORMAL HIGH (ref 8–23)
CO2: 23 mmol/L (ref 22–32)
Calcium: 8.8 mg/dL — ABNORMAL LOW (ref 8.9–10.3)
Chloride: 105 mmol/L (ref 98–111)
Creatinine, Ser: 1.09 mg/dL (ref 0.61–1.24)
GFR, Estimated: >60 mL/min (ref >60)
Glucose, Bld: 119 mg/dL — ABNORMAL HIGH (ref 70–99)
Potassium: 5.2 mmol/L — ABNORMAL HIGH (ref 3.5–5.1)
Sodium: 138 mmol/L (ref 135–145)
Total Bilirubin: 0.8 mg/dL (ref 0.3–1.2)
Total Protein: 5.7 g/dL — ABNORMAL LOW (ref 6.5–8.1)

## 2021-09-27 LAB — TROPONIN I (HIGH SENSITIVITY)
Troponin I (High Sensitivity): 42 ng/L — ABNORMAL HIGH (ref <18)
Troponin I (High Sensitivity): 45 ng/L — ABNORMAL HIGH (ref <18)

## 2021-09-27 LAB — CBC WITH DIFFERENTIAL/PLATELET
Abs Immature Granulocytes: 0.05 10*3/uL (ref 0.00–0.07)
Basophils Absolute: 0 10*3/uL (ref 0.0–0.1)
Basophils Relative: 1 %
Eosinophils Absolute: 0.1 10*3/uL (ref 0.0–0.5)
Eosinophils Relative: 1 %
HCT: 35.6 % — ABNORMAL LOW (ref 39.0–52.0)
Hemoglobin: 11.3 g/dL — ABNORMAL LOW (ref 13.0–17.0)
Immature Granulocytes: 1 %
Lymphocytes Relative: 8 %
Lymphs Abs: 0.6 10*3/uL — ABNORMAL LOW (ref 0.7–4.0)
MCH: 31.1 pg (ref 26.0–34.0)
MCHC: 31.7 g/dL (ref 30.0–36.0)
MCV: 98.1 fL (ref 80.0–100.0)
Monocytes Absolute: 0.9 10*3/uL (ref 0.1–1.0)
Monocytes Relative: 12 %
Neutro Abs: 5.7 10*3/uL (ref 1.7–7.7)
Neutrophils Relative %: 77 %
Platelets: 170 10*3/uL (ref 150–400)
RBC: 3.63 MIL/uL — ABNORMAL LOW (ref 4.22–5.81)
RDW: 17.4 % — ABNORMAL HIGH (ref 11.5–15.5)
WBC: 7.4 10*3/uL (ref 4.0–10.5)
nRBC: 0 % (ref 0.0–0.2)

## 2021-09-27 LAB — BRAIN NATRIURETIC PEPTIDE: B Natriuretic Peptide: 354.5 pg/mL — ABNORMAL HIGH (ref 0.0–100.0)

## 2021-09-27 NOTE — ED Triage Notes (Signed)
Patient reports worsening edema at upper and lower extremities with exertional dyspnea. History of CHF . He has not taken his Lasix for > 1 month . Denies chest pain .

## 2021-09-28 DIAGNOSIS — I50813 Acute on chronic right heart failure: Secondary | ICD-10-CM | POA: Diagnosis present

## 2021-09-28 DIAGNOSIS — Z801 Family history of malignant neoplasm of trachea, bronchus and lung: Secondary | ICD-10-CM | POA: Diagnosis not present

## 2021-09-28 DIAGNOSIS — Z66 Do not resuscitate: Secondary | ICD-10-CM | POA: Diagnosis present

## 2021-09-28 DIAGNOSIS — M1711 Unilateral primary osteoarthritis, right knee: Secondary | ICD-10-CM | POA: Diagnosis present

## 2021-09-28 DIAGNOSIS — I5043 Acute on chronic combined systolic (congestive) and diastolic (congestive) heart failure: Secondary | ICD-10-CM | POA: Diagnosis present

## 2021-09-28 DIAGNOSIS — I442 Atrioventricular block, complete: Secondary | ICD-10-CM | POA: Diagnosis present

## 2021-09-28 DIAGNOSIS — E119 Type 2 diabetes mellitus without complications: Secondary | ICD-10-CM

## 2021-09-28 DIAGNOSIS — I5082 Biventricular heart failure: Secondary | ICD-10-CM | POA: Diagnosis present

## 2021-09-28 DIAGNOSIS — E785 Hyperlipidemia, unspecified: Secondary | ICD-10-CM | POA: Diagnosis present

## 2021-09-28 DIAGNOSIS — I340 Nonrheumatic mitral (valve) insufficiency: Secondary | ICD-10-CM | POA: Diagnosis not present

## 2021-09-28 DIAGNOSIS — I4821 Permanent atrial fibrillation: Secondary | ICD-10-CM | POA: Diagnosis present

## 2021-09-28 DIAGNOSIS — R601 Generalized edema: Secondary | ICD-10-CM | POA: Diagnosis present

## 2021-09-28 DIAGNOSIS — N182 Chronic kidney disease, stage 2 (mild): Secondary | ICD-10-CM | POA: Diagnosis present

## 2021-09-28 DIAGNOSIS — I2721 Secondary pulmonary arterial hypertension: Secondary | ICD-10-CM | POA: Diagnosis present

## 2021-09-28 DIAGNOSIS — E1122 Type 2 diabetes mellitus with diabetic chronic kidney disease: Secondary | ICD-10-CM | POA: Diagnosis present

## 2021-09-28 DIAGNOSIS — Z515 Encounter for palliative care: Secondary | ICD-10-CM | POA: Diagnosis not present

## 2021-09-28 DIAGNOSIS — B964 Proteus (mirabilis) (morganii) as the cause of diseases classified elsewhere: Secondary | ICD-10-CM | POA: Diagnosis not present

## 2021-09-28 DIAGNOSIS — D631 Anemia in chronic kidney disease: Secondary | ICD-10-CM | POA: Diagnosis present

## 2021-09-28 DIAGNOSIS — G4733 Obstructive sleep apnea (adult) (pediatric): Secondary | ICD-10-CM | POA: Diagnosis present

## 2021-09-28 DIAGNOSIS — I482 Chronic atrial fibrillation, unspecified: Secondary | ICD-10-CM | POA: Diagnosis not present

## 2021-09-28 DIAGNOSIS — E669 Obesity, unspecified: Secondary | ICD-10-CM | POA: Diagnosis present

## 2021-09-28 DIAGNOSIS — I248 Other forms of acute ischemic heart disease: Secondary | ICD-10-CM | POA: Diagnosis present

## 2021-09-28 DIAGNOSIS — E1165 Type 2 diabetes mellitus with hyperglycemia: Secondary | ICD-10-CM | POA: Diagnosis present

## 2021-09-28 DIAGNOSIS — Z952 Presence of prosthetic heart valve: Secondary | ICD-10-CM | POA: Diagnosis not present

## 2021-09-28 DIAGNOSIS — Z8249 Family history of ischemic heart disease and other diseases of the circulatory system: Secondary | ICD-10-CM | POA: Diagnosis not present

## 2021-09-28 DIAGNOSIS — I5033 Acute on chronic diastolic (congestive) heart failure: Secondary | ICD-10-CM | POA: Diagnosis present

## 2021-09-28 DIAGNOSIS — Z7984 Long term (current) use of oral hypoglycemic drugs: Secondary | ICD-10-CM | POA: Diagnosis not present

## 2021-09-28 DIAGNOSIS — N390 Urinary tract infection, site not specified: Secondary | ICD-10-CM | POA: Diagnosis not present

## 2021-09-28 DIAGNOSIS — Z7189 Other specified counseling: Secondary | ICD-10-CM | POA: Diagnosis not present

## 2021-09-28 DIAGNOSIS — I5020 Unspecified systolic (congestive) heart failure: Secondary | ICD-10-CM | POA: Diagnosis not present

## 2021-09-28 DIAGNOSIS — E875 Hyperkalemia: Secondary | ICD-10-CM | POA: Diagnosis present

## 2021-09-28 DIAGNOSIS — I130 Hypertensive heart and chronic kidney disease with heart failure and stage 1 through stage 4 chronic kidney disease, or unspecified chronic kidney disease: Secondary | ICD-10-CM | POA: Diagnosis present

## 2021-09-28 DIAGNOSIS — I080 Rheumatic disorders of both mitral and aortic valves: Secondary | ICD-10-CM | POA: Diagnosis present

## 2021-09-28 DIAGNOSIS — E739 Lactose intolerance, unspecified: Secondary | ICD-10-CM | POA: Diagnosis present

## 2021-09-28 LAB — BASIC METABOLIC PANEL
Anion gap: 7 (ref 5–15)
BUN: 38 mg/dL — ABNORMAL HIGH (ref 8–23)
CO2: 27 mmol/L (ref 22–32)
Calcium: 8.7 mg/dL — ABNORMAL LOW (ref 8.9–10.3)
Chloride: 104 mmol/L (ref 98–111)
Creatinine, Ser: 1.11 mg/dL (ref 0.61–1.24)
GFR, Estimated: >60 mL/min (ref >60)
Glucose, Bld: 151 mg/dL — ABNORMAL HIGH (ref 70–99)
Potassium: 4.7 mmol/L (ref 3.5–5.1)
Sodium: 138 mmol/L (ref 135–145)

## 2021-09-28 LAB — GLUCOSE, CAPILLARY
Glucose-Capillary: 134 mg/dL — ABNORMAL HIGH (ref 70–99)
Glucose-Capillary: 155 mg/dL — ABNORMAL HIGH (ref 70–99)

## 2021-09-28 LAB — CBG MONITORING, ED
Glucose-Capillary: 93 mg/dL (ref 70–99)
Glucose-Capillary: 150 mg/dL — ABNORMAL HIGH (ref 70–99)

## 2021-09-28 LAB — PROTIME-INR
INR: 1.9 — ABNORMAL HIGH (ref 0.8–1.2)
Prothrombin Time: 21.3 seconds — ABNORMAL HIGH (ref 11.4–15.2)

## 2021-09-28 MED ORDER — INSULIN ASPART 100 UNIT/ML IJ SOLN: 0.0000 [IU] | Freq: Three times a day (TID) | INTRAMUSCULAR | Status: DC

## 2021-09-28 MED ORDER — LISINOPRIL 10 MG PO TABS: 10.0000 mg | ORAL_TABLET | Freq: Every day | ORAL | Status: DC

## 2021-09-28 MED ORDER — WARFARIN - PHARMACIST DOSING INPATIENT: Freq: Every day | Status: DC

## 2021-09-28 MED ORDER — INSULIN ASPART 100 UNIT/ML IJ SOLN: 0.0000 [IU] | Freq: Every day | INTRAMUSCULAR | Status: DC

## 2021-09-28 MED ORDER — ACETAMINOPHEN 325 MG PO TABS
650.0000 mg | ORAL_TABLET | Freq: Four times a day (QID) | ORAL | Status: DC | PRN
  Administered 2021-10-02 – 2021-10-04 (×2): 650 mg via ORAL
  Filled 2021-09-28 (×2): qty 2

## 2021-09-28 MED ORDER — FUROSEMIDE 10 MG/ML IJ SOLN
80.0000 mg | Freq: Once | INTRAMUSCULAR | Status: AC
  Administered 2021-09-28: 80 mg via INTRAVENOUS
  Filled 2021-09-28: qty 8

## 2021-09-28 MED ORDER — TAMSULOSIN HCL 0.4 MG PO CAPS
0.4000 mg | ORAL_CAPSULE | Freq: Every day | ORAL | Status: DC
  Administered 2021-09-28 – 2021-10-04 (×7): 0.4 mg via ORAL
  Filled 2021-09-28 (×7): qty 1

## 2021-09-28 MED ORDER — SODIUM CHLORIDE 0.9% FLUSH
3.0000 mL | Freq: Two times a day (BID) | INTRAVENOUS | Status: DC
  Administered 2021-09-28 – 2021-10-04 (×12): 3 mL via INTRAVENOUS

## 2021-09-28 MED ORDER — SENNOSIDES-DOCUSATE SODIUM 8.6-50 MG PO TABS
1.0000 | ORAL_TABLET | Freq: Every evening | ORAL | Status: DC | PRN
Start: 2021-09-28 — End: 2021-10-05

## 2021-09-28 MED ORDER — DILTIAZEM HCL ER COATED BEADS 120 MG PO CP24
120.0000 mg | ORAL_CAPSULE | Freq: Every day | ORAL | Status: DC
  Administered 2021-09-28: 120 mg via ORAL
  Filled 2021-09-28 (×2): qty 1

## 2021-09-28 MED ORDER — METOPROLOL SUCCINATE ER 100 MG PO TB24
100.0000 mg | ORAL_TABLET | Freq: Every day | ORAL | Status: DC
  Administered 2021-09-28: 100 mg via ORAL
  Filled 2021-09-28: qty 1

## 2021-09-28 MED ORDER — FUROSEMIDE 10 MG/ML IJ SOLN
40.0000 mg | Freq: Two times a day (BID) | INTRAMUSCULAR | Status: DC
  Administered 2021-09-28 – 2021-09-29 (×3): 40 mg via INTRAVENOUS
  Filled 2021-09-28 (×3): qty 4

## 2021-09-28 MED ORDER — WARFARIN SODIUM 5 MG PO TABS
5.0000 mg | ORAL_TABLET | Freq: Once | ORAL | Status: AC
  Administered 2021-09-28: 5 mg via ORAL
  Filled 2021-09-28 (×2): qty 1

## 2021-09-28 MED ORDER — ACETAMINOPHEN 650 MG RE SUPP: 650.0000 mg | Freq: Four times a day (QID) | RECTAL | Status: DC | PRN

## 2021-09-28 MED ORDER — ATORVASTATIN CALCIUM 10 MG PO TABS
20.0000 mg | ORAL_TABLET | Freq: Every day | ORAL | Status: DC
  Administered 2021-09-28 – 2021-10-04 (×7): 20 mg via ORAL
  Filled 2021-09-28 (×7): qty 2

## 2021-09-28 NOTE — ED Notes (Signed)
Pt's sister Judeen Hammans) updated about pt's plan of care.

## 2021-09-28 NOTE — Progress Notes (Addendum)
ANTICOAGULATION CONSULT NOTE - Initial Consult  Pharmacy Consult for Coumadin Indication: atrial fibrillation  Allergies  Allergen Reactions   Aspirin     visual issues and fatigue   Macrobid [Nitrofurantoin]    Nystatin    Prevnar 13 [Pneumococcal 13-Val Conj Vacc]    Bisoprolol-Hydrochlorothiazide Other (See Comments)    "Severe" arthralgias and myalgias   Nsaids Other (See Comments)    GI upset, Peptic ulcer disease   Vital Signs: Temp: 97.6 F (36.4 C) (06/27 1938) Temp Source: Oral (06/27 1938) BP: 106/52 (06/28 0630) Pulse Rate: 61 (06/28 0630)  Labs: Recent Labs    09/27/21 1952 09/27/21 2209 09/28/21 0040  HGB 11.3*  --   --   HCT 35.6*  --   --   PLT 170  --   --   LABPROT  --   --  21.3*  INR  --   --  1.9*  CREATININE 1.09  --   --   TROPONINIHS 42* 45*  --    Medical History: Past Medical History:  Diagnosis Date   Amputation finger    Distal 4th & 5th digits of right hand   Arthritis    Chronic atrial fibrillation (HCC)    Chronic diastolic CHF (congestive heart failure) (HCC)    Diabetes mellitus type 2, noninsulin dependent (HCC)    History of blood transfusion    5 pints for peptic ulcer   History of kidney stones    Passed x 2   Hypertension    Mitral valve disease    mild-moderate MR, moderate MS 07/15/19 echo   OSA (obstructive sleep apnea)    Other malignant lymphomas, unspecified site, extranodal and solid organ sites    Non Hodkins Lymothopathy   Ruptured disk    S/P TAVR (transcatheter aortic valve replacement) 09/16/2019   s/p TAVR with a 29 mm Edwards Sapien 3 THV via the TF approach by Dr. Burt Knack and Dr. Cyndia Bent   Severe aortic stenosis    Ulcer    peptic Ulcer - bleeding    Assessment: 81yo male c/o worsening edema of BUE/BLE with exertional dyspnea, admitted for acute on chronic HF, to continue Coumadin for Afib; current INR below goal, last dose taken 6/26.  Goal of Therapy:  INR 2-3   Plan:  Coumadin '5mg'$  PO x1 today  (usual dose 2.'5mg'$  daily). Monitor INR for dose adjustments.  Wynona Neat, PharmD, BCPS  09/28/2021,6:48 AM

## 2021-09-28 NOTE — H&P (Signed)
History and Physical    Gregory [name] WUJ:[id number] DOB: 1941-03-23 DOA: 09/27/2021  PCP: Kathyrn Lass, MD   Patient coming from: Home   Chief Complaint: Swelling, DOE   HPI: Gregory [name] is a pleasant 81 y.o. male with medical history significant for atrial fibrillation on warfarin, chronic diastolic CHF, chronic right-sided heart failure, severe AS status post TAVR, severe mitral regurgitation, moderate-severe mitral stenosis, type 2 diabetes mellitus, non-Hodgkin lymphoma, and hypertension, now presenting to the emergency department for evaluation of swelling and worsening exertional dyspnea.  Patient reports that he is prescribed as needed Lasix, has not used any in more than a month, and has developed worsening edema involving the bilateral lower extremities and now also bilateral upper extremities with serous weeping.  He has developed progressive exertional dyspnea as well.  He has been having difficulty performing ADLs due to edema.  Denies any chest pain, fever, or chills.  ED Course: Upon arrival to the ED, patient is found to be afebrile and saturating well on room air with mild tachypnea and stable blood pressure.  EKG features a ventricular paced rhythm with RBBB.  Mild to moderate CHF changes noted on chest x-ray.  Chemistry panel with potassium 5.2 and creatinine 1.09.  INR is 1.9.  BNP elevated to 355.  Troponin slightly elevated and flat.  Patient was given 80 mg IV Lasix in the ED.  Review of Systems:  All other systems reviewed and apart from HPI, are negative.  Past Medical History:  Diagnosis Date   Amputation finger    Distal 4th & 5th digits of right hand   Arthritis    Chronic atrial fibrillation (HCC)    Chronic diastolic CHF (congestive heart failure) (HCC)    Diabetes mellitus type 2, noninsulin dependent (HCC)    History of blood transfusion    5 pints for peptic ulcer   History of kidney stones    Passed x 2   Hypertension    Mitral valve disease     mild-moderate MR, moderate MS 07/15/19 echo   OSA (obstructive sleep apnea)    Other malignant lymphomas, unspecified site, extranodal and solid organ sites    Non Hodkins Lymothopathy   Ruptured disk    S/P TAVR (transcatheter aortic valve replacement) 09/16/2019   s/p TAVR with a 29 mm Edwards Sapien 3 THV via the TF approach by Dr. Burt Knack and Dr. Cyndia Bent   Severe aortic stenosis    Ulcer    peptic Ulcer - bleeding    Past Surgical History:  Procedure Laterality Date   APPENDECTOMY  1967   COLONOSCOPY     left knee surgery     left shoulder surgery     MULTIPLE EXTRACTIONS WITH ALVEOLOPLASTY N/A 09/04/2019   Procedure: Extraction of tooth #'s 2,7,8,9,11,13,14 20,30, and 31 with alveoloplatsty and gross debridement of remaining teeth;  Surgeon: Lenn Cal, DDS;  Location: [facility];  Service: Oral Surgery;  Laterality: N/A;   PACEMAKER IMPLANT N/A 09/17/2019   Procedure: PACEMAKER IMPLANT;  Surgeon: Constance Haw, MD;  Location: [facility];  Service: Cardiovascular;  Laterality: N/A;   RIGHT/LEFT HEART CATH AND CORONARY ANGIOGRAPHY N/A 08/12/2019   Procedure: RIGHT/LEFT HEART CATH AND CORONARY ANGIOGRAPHY;  Surgeon: Jettie Booze, MD;  Location: [facility];  Service: Cardiovascular;  Laterality: N/A;   TEE WITHOUT CARDIOVERSION N/A 09/16/2019   Procedure: TRANSESOPHAGEAL ECHOCARDIOGRAM (TEE);  Surgeon: Sherren Mocha, MD;  Location: [facility];  Service: Open  Heart Surgery;  Laterality: N/A;   TONSILLECTOMY     TRANSCATHETER AORTIC VALVE REPLACEMENT, TRANSFEMORAL N/A 09/16/2019   Procedure: TRANSCATHETER AORTIC VALVE REPLACEMENT, TRANSFEMORAL;  Surgeon: Sherren Mocha, MD;  Location: [facility];  Service: Open Heart Surgery;  Laterality: N/A;    Social History:   reports that he has never smoked. He has never used smokeless tobacco. He reports that he does not drink alcohol and does not use drugs.  Allergies  Allergen Reactions    Aspirin     visual issues and fatigue   Macrobid [Nitrofurantoin]    Nystatin    Prevnar 13 [Pneumococcal 13-Val Conj Vacc]    Bisoprolol-Hydrochlorothiazide Other (See Comments)    "Severe" arthralgias and myalgias   Nsaids Other (See Comments)    GI upset, Peptic ulcer disease    Family History  Problem Relation Age of Onset   Hypertension Father    Heart attack Father    Heart attack Mother    Cancer Mother        LUNG   Stroke Neg Hx      Prior to Admission medications   Medication Sig Start Date End Date Taking? Authorizing Provider  acetaminophen (TYLENOL) 650 MG CR tablet Take 650 mg by mouth every 8 (eight) hours as needed for pain.   Yes [provider]  atorvastatin (LIPITOR) 20 MG tablet Take 20 mg by mouth daily. 04/22/12  Yes [provider]  cyanocobalamin 1000 MCG tablet Take 1,000 mcg by mouth daily.   Yes [provider]  diltiazem (CARDIZEM CD) 120 MG 24 hr capsule Take 120 mg by mouth at bedtime. 06/03/15  Yes [provider]  ketoconazole (NIZORAL) 2 % cream Apply 1 Application topically 2 (two) times daily as needed for irritation.   Yes [provider]  lisinopril (PRINIVIL,ZESTRIL) 10 MG tablet Take 10 mg by mouth at bedtime. 04/08/18  Yes [provider]  metFORMIN (GLUCOPHAGE-XR) 500 MG 24 hr tablet Take 1 tablet (500 mg total) by mouth 2 (two) times daily. Patient taking differently: Take 500 mg by mouth daily with breakfast. 08/14/19  Yes Jettie Booze, MD  metoprolol succinate (TOPROL-XL) 100 MG 24 hr tablet Take 100 mg by mouth at bedtime. Take with or immediately following a meal.   Yes [provider]  Multiple Vitamins-Minerals (PRESERVISION AREDS 2 PO) Take 1 tablet by mouth daily.   Yes [provider]  pioglitazone (ACTOS) 15 MG tablet Take 15 mg by mouth daily.    Yes [provider]  tamsulosin (FLOMAX) 0.4 MG CAPS capsule Take 0.4 mg by mouth daily. 09/16/20  Yes  [provider]  warfarin (COUMADIN) 5 MG tablet Take 2.5 mg by mouth at bedtime.   Yes [provider]  furosemide (LASIX) 40 MG tablet Take 1 tablet (40 mg total) by mouth daily as needed. For swelling Patient not taking: Reported on 09/28/2021 10/02/19   Eileen Stanford, PA-C  ONE TOUCH ULTRA TEST test strip 1 each by Other route as needed (BLOOD SUGAR).  07/14/15   [provider]  Jonetta Speak LANCETS 38V MISC Apply 1 each topically daily as needed (BLOOD SUGAR).  07/14/15   [provider]    Physical Exam: Vitals:   09/28/21 0345 09/28/21 0400 09/28/21 0500 09/28/21 0535  BP: 111/85 115/64 (!) 113/52 (!) 105/46  Pulse: 69 (!) 59 60 63  Resp: (!) 23 (!) 25 (!) 26 20  Temp:      TempSrc:  SpO2: 100% 97% 95% 95%    Constitutional: NAD, calm  Eyes: PERTLA, lids and conjunctivae normal ENMT: Mucous membranes are moist. Posterior pharynx clear of any exudate or lesions.   Neck: supple, no masses  Respiratory: no wheezing, no crackles. No accessory muscle use.  Cardiovascular: S1 & S2 heard, regular rate and rhythm. Edema of all 4 extremities. Abdomen: No distension, no tenderness, soft. Bowel sounds active.  Musculoskeletal: no clubbing / cyanosis. S/p amputation distal fingers 4 and 5 on right.   Skin: no significant rashes, lesions, ulcers. Warm, dry, well-perfused. Neurologic: CN 2-12 grossly intact. Moving all extremities. Alert and oriented.  Psychiatric: Pleasant. Cooperative.    Labs and Imaging on Admission: I have personally reviewed following labs and imaging studies  CBC: Recent Labs  Lab 09/27/21 1952  WBC 7.4  NEUTROABS 5.7  HGB 11.3*  HCT 35.6*  MCV 98.1  PLT 026   Basic Metabolic Panel: Recent Labs  Lab 09/27/21 1952  NA 138  K 5.2*  CL 105  CO2 23  GLUCOSE 119*  BUN 39*  CREATININE 1.09  CALCIUM 8.8*   GFR: CrCl cannot be calculated (Unknown ideal weight.). Liver Function Tests: Recent Labs  Lab  09/27/21 1952  AST 28  ALT 19  ALKPHOS 120  BILITOT 0.8  PROT 5.7*  ALBUMIN 3.3*   No results for input(s): "LIPASE", "AMYLASE" in the last 168 hours. No results for input(s): "AMMONIA" in the last 168 hours. Coagulation Profile: Recent Labs  Lab 09/28/21 0040  INR 1.9*   Cardiac Enzymes: No results for input(s): "CKTOTAL", "CKMB", "CKMBINDEX", "TROPONINI" in the last 168 hours. BNP (last 3 results) No results for input(s): "PROBNP" in the last 8760 hours. HbA1C: No results for input(s): "HGBA1C" in the last 72 hours. CBG: No results for input(s): "GLUCAP" in the last 168 hours. Lipid Profile: No results for input(s): "CHOL", "HDL", "LDLCALC", "TRIG", "CHOLHDL", "LDLDIRECT" in the last 72 hours. Thyroid Function Tests: No results for input(s): "TSH", "T4TOTAL", "FREET4", "T3FREE", "THYROIDAB" in the last 72 hours. Anemia Panel: No results for input(s): "VITAMINB12", "FOLATE", "FERRITIN", "TIBC", "IRON", "RETICCTPCT" in the last 72 hours. Urine analysis:    Component Value Date/Time   COLORURINE YELLOW 09/12/2019 1000   APPEARANCEUR CLEAR 09/12/2019 1000   LABSPEC 1.006 09/12/2019 1000   PHURINE 7.0 09/12/2019 1000   GLUCOSEU NEGATIVE 09/12/2019 1000   HGBUR NEGATIVE 09/12/2019 1000   BILIRUBINUR NEGATIVE 09/12/2019 1000   KETONESUR NEGATIVE 09/12/2019 1000   PROTEINUR 30 (A) 09/12/2019 1000   NITRITE NEGATIVE 09/12/2019 1000   LEUKOCYTESUR NEGATIVE 09/12/2019 1000   Sepsis Labs: '@LABRCNTIP'$ (procalcitonin:4,lacticidven:4) )No results found for this or any previous visit (from the past 240 hour(s)).   Radiological Exams on Admission: DG Chest 2 View  Result Date: 09/27/2021 CLINICAL DATA:  Shortness of breath. EXAM: CHEST - 2 VIEW COMPARISON:  Radiograph 09/18/2019 FINDINGS: Right-sided pacemaker remains in place. Similar cardiomegaly. TAVR. There are bilateral pleural effusions and bibasilar opacities, likely representing atelectasis, left greater than right. Mild  vascular congestion. Subtle Kerley B-lines/pulmonary edema. No pneumothorax. No acute osseous findings. IMPRESSION: Mild to moderate CHF. Electronically Signed   By: Keith Rake M.D.   On: 09/27/2021 20:31    EKG: Independently reviewed. Paced rhythm.   Assessment/Plan   1. Acute on chronic HFmrEF; acute on chronic right heart failure; severe MR; mod-severe MS  - Presents with anasarca and serous weeping, now unable to complete ADLs d/t swelling  - EF was 45% on TTE from 09/22/21 with severely  reduced RV systolic function, severe MR, mod-severe MS, and mild perivalvular AI - He was given 80 mg IV Lasix in ED  - Continue diuresis with 40 mg IV Lasix q12h, monitor wt and I/Os, monitor renal function and electrolytes    2. Atrial fibrillation  - Continue warfarin, diltiazem, metoprolol    3. Type II DM  - A1c was 7.2% in June 2021  - Check CBGs and use low-intensity SSI for now    4. CKD II; hyperkalemia  - SCr is 1.09 on admission, similar to priors  - Potassium 5.2, anticipate correction with diuresis but will hold lisinopril initially    DVT prophylaxis: warfarin  Code Status: DNR, discussed with patient on admission  Level of Care: Level of care: Telemetry Cardiac Family Communication: none present  Disposition Plan:  Patient is from: Home   Anticipated d/c is to: TBD  Anticipated d/c date is: 10/01/21 Patient currently: needs diuresis, close monitoring of renal function, likely PT eval Consults called: none  Admission status: Inpatient     Vianne Bulls, MD [facility]  09/28/2021, 6:08 AM

## 2021-09-28 NOTE — ED Provider Notes (Signed)
[facility] EMERGENCY DEPARTMENT Provider Note   CSN: [id number] Arrival date & time: 09/27/21  1930     History  Chief Complaint  Patient presents with   SOB / Edema    CHF    Gregory [name] is a 81 y.o. male.  81 yo M here with diffuse swelling worsening over the last couple weeks associated with dyspnea and DOE. He has a bad knee but states over the last few weeks he can't even walk because of the swelling and dyspnea. No chest pain. No cough. Has orthopnea. No fever.         Home Medications Prior to Admission medications   Medication Sig Start Date End Date Taking? Authorizing Provider  acetaminophen (TYLENOL) 650 MG CR tablet Take 650 mg by mouth every 8 (eight) hours as needed for pain.   Yes [provider]  atorvastatin (LIPITOR) 20 MG tablet Take 20 mg by mouth daily. 04/22/12  Yes [provider]  cyanocobalamin 1000 MCG tablet Take 1,000 mcg by mouth daily.   Yes [provider]  diltiazem (CARDIZEM CD) 120 MG 24 hr capsule Take 120 mg by mouth at bedtime. 06/03/15  Yes [provider]  ketoconazole (NIZORAL) 2 % cream Apply 1 Application topically 2 (two) times daily as needed for irritation.   Yes [provider]  lisinopril (PRINIVIL,ZESTRIL) 10 MG tablet Take 10 mg by mouth at bedtime. 04/08/18  Yes [provider]  metFORMIN (GLUCOPHAGE-XR) 500 MG 24 hr tablet Take 1 tablet (500 mg total) by mouth 2 (two) times daily. Patient taking differently: Take 500 mg by mouth daily with breakfast. 08/14/19  Yes Jettie Booze, MD  metoprolol succinate (TOPROL-XL) 100 MG 24 hr tablet Take 100 mg by mouth at bedtime. Take with or immediately following a meal.   Yes [provider]  Multiple Vitamins-Minerals (PRESERVISION AREDS 2 PO) Take 1 tablet by mouth daily.   Yes [provider]  pioglitazone (ACTOS) 15 MG tablet Take 15 mg by mouth daily.    Yes [provider]   tamsulosin (FLOMAX) 0.4 MG CAPS capsule Take 0.4 mg by mouth daily. 09/16/20  Yes [provider]  warfarin (COUMADIN) 5 MG tablet Take 2.5 mg by mouth at bedtime.   Yes [provider]  furosemide (LASIX) 40 MG tablet Take 1 tablet (40 mg total) by mouth daily as needed. For swelling Patient not taking: Reported on 09/28/2021 10/02/19   Eileen Stanford, PA-C  ONE TOUCH ULTRA TEST test strip 1 each by Other route as needed (BLOOD SUGAR).  07/14/15   [provider]  Jonetta Speak LANCETS 14E MISC Apply 1 each topically daily as needed (BLOOD SUGAR).  07/14/15   [provider]      Allergies    Aspirin, Macrobid [nitrofurantoin], Nystatin, Prevnar 13 [pneumococcal 13-val conj vacc], Bisoprolol-hydrochlorothiazide, and Nsaids    Review of Systems   Review of Systems  Physical Exam Updated Vital Signs BP (!) 115/53   Pulse (!) 59   Temp 97.6 F (36.4 C) (Oral)   Resp (!) 22   SpO2 96%  Physical Exam Vitals and nursing note reviewed.  Constitutional:      Appearance: He is well-developed.     Comments: Subcutaneous edema up to nipples, pitting  HENT:     Head: Normocephalic and atraumatic.  Eyes:     Pupils: Pupils are equal, round, and reactive to light.  Neck:     Vascular: JVD  present.  Cardiovascular:     Rate and Rhythm: Normal rate.  Pulmonary:     Effort: Pulmonary effort is normal. No respiratory distress.  Abdominal:     General: Abdomen is flat. There is no distension.  Musculoskeletal:        General: Swelling present. Normal range of motion.     Cervical back: Normal range of motion.     Right lower leg: Edema present.     Left lower leg: Edema present.  Skin:    General: Skin is warm and dry.     Coloration: Skin is not jaundiced or pale.  Neurological:     General: No focal deficit present.     Mental Status: He is alert. Mental status is at baseline.     ED Results / Procedures / Treatments   Labs (all labs  ordered are listed, but only abnormal results are displayed) Labs Reviewed  CBC WITH DIFFERENTIAL/PLATELET - Abnormal; Notable for the following components:      Result Value   RBC 3.63 (*)    Hemoglobin 11.3 (*)    HCT 35.6 (*)    RDW 17.4 (*)    Lymphs Abs 0.6 (*)    All other components within normal limits  BRAIN NATRIURETIC PEPTIDE - Abnormal; Notable for the following components:   B Natriuretic Peptide 354.5 (*)    All other components within normal limits  COMPREHENSIVE METABOLIC PANEL - Abnormal; Notable for the following components:   Potassium 5.2 (*)    Glucose, Bld 119 (*)    BUN 39 (*)    Calcium 8.8 (*)    Total Protein 5.7 (*)    Albumin 3.3 (*)    All other components within normal limits  PROTIME-INR - Abnormal; Notable for the following components:   Prothrombin Time 21.3 (*)    INR 1.9 (*)    All other components within normal limits  TROPONIN I (HIGH SENSITIVITY) - Abnormal; Notable for the following components:   Troponin I (High Sensitivity) 42 (*)    All other components within normal limits  TROPONIN I (HIGH SENSITIVITY) - Abnormal; Notable for the following components:   Troponin I (High Sensitivity) 45 (*)    All other components within normal limits    EKG EKG Interpretation  Date/Time:  Tuesday September 27 2021 19:40:09 EDT Ventricular Rate:  61 PR Interval:    QRS Duration: 164 QT Interval:  486 QTC Calculation: 489 R Axis:   256 Text Interpretation: Wide QRS rhythm Right bundle branch block Inferior infarct , age undetermined Anterolateral infarct , age undetermined Abnormal ECG When compared with ECG of 18-Sep-2019 07:30, PREVIOUS ECG IS PRESENT Confirmed by Merrily Pew [phone]) on 09/28/2021 12:11:08 AM  Radiology DG Chest 2 View  Result Date: 09/27/2021 CLINICAL DATA:  Shortness of breath. EXAM: CHEST - 2 VIEW COMPARISON:  Radiograph 09/18/2019 FINDINGS: Right-sided pacemaker remains in place. Similar cardiomegaly. TAVR. There are  bilateral pleural effusions and bibasilar opacities, likely representing atelectasis, left greater than right. Mild vascular congestion. Subtle Kerley B-lines/pulmonary edema. No pneumothorax. No acute osseous findings. IMPRESSION: Mild to moderate CHF. Electronically Signed   By: Keith Rake M.D.   On: 09/27/2021 20:31    Procedures Procedures    Medications Ordered in ED Medications  furosemide (LASIX) injection 80 mg (80 mg Intravenous Given 09/28/21 0041)    ED Course/ Medical Decision Making/ A&P  Medical Decision Making Amount and/or Complexity of Data Reviewed Labs: ordered.  Risk Prescription drug management. Decision regarding hospitalization.   Anasarca. Hasn't responded to lasix yet, will admit for further diuresis.   Final Clinical Impression(s) / ED Diagnoses Final diagnoses:  Anasarca    Rx / DC Orders ED Discharge Orders     None         Ryle Buscemi, Corene Cornea, MD 09/28/21 [phone]

## 2021-09-28 NOTE — Progress Notes (Signed)
Gregory [name] is a pleasant 81 y.o. male with medical history significant for atrial fibrillation on warfarin, chronic diastolic CHF, chronic right-sided heart failure, severe AS status post TAVR, severe mitral regurgitation, moderate-severe mitral stenosis, type 2 diabetes mellitus, non-Hodgkin lymphoma, and hypertension, now presenting to the emergency department for evaluation of swelling and worsening exertional dyspnea.  Patient reports that he is prescribed as needed Lasix, has not used any in more than a month, and has developed worsening edema involving the bilateral lower extremities and now also bilateral upper extremities with serous weeping.  He has developed progressive exertional dyspnea as well.    He was admitted for acute CHF exacerbation.  Started on IV lasix.  Continue to monitor.    Imran Nuon,MD.

## 2021-09-29 ENCOUNTER — Other Ambulatory Visit (HOSPITAL_COMMUNITY): Payer: Self-pay

## 2021-09-29 ENCOUNTER — Encounter (HOSPITAL_COMMUNITY): Payer: Self-pay | Admitting: Family Medicine

## 2021-09-29 DIAGNOSIS — E119 Type 2 diabetes mellitus without complications: Secondary | ICD-10-CM | POA: Diagnosis not present

## 2021-09-29 DIAGNOSIS — I5033 Acute on chronic diastolic (congestive) heart failure: Secondary | ICD-10-CM | POA: Diagnosis not present

## 2021-09-29 DIAGNOSIS — I482 Chronic atrial fibrillation, unspecified: Secondary | ICD-10-CM | POA: Diagnosis not present

## 2021-09-29 DIAGNOSIS — I50813 Acute on chronic right heart failure: Secondary | ICD-10-CM | POA: Diagnosis not present

## 2021-09-29 LAB — PROTIME-INR
INR: 1.8 — ABNORMAL HIGH (ref 0.8–1.2)
Prothrombin Time: 20.6 seconds — ABNORMAL HIGH (ref 11.4–15.2)

## 2021-09-29 LAB — GLUCOSE, CAPILLARY
Glucose-Capillary: 118 mg/dL — ABNORMAL HIGH (ref 70–99)
Glucose-Capillary: 130 mg/dL — ABNORMAL HIGH (ref 70–99)
Glucose-Capillary: 126 mg/dL — ABNORMAL HIGH (ref 70–99)
Glucose-Capillary: 202 mg/dL — ABNORMAL HIGH (ref 70–99)

## 2021-09-29 LAB — HEMOGLOBIN A1C
Hgb A1c MFr Bld: 6.3 % — ABNORMAL HIGH (ref 4.8–5.6)
Mean Plasma Glucose: 134.11 mg/dL

## 2021-09-29 LAB — BASIC METABOLIC PANEL
Anion gap: 11 (ref 5–15)
BUN: 35 mg/dL — ABNORMAL HIGH (ref 8–23)
CO2: 29 mmol/L (ref 22–32)
Calcium: 8.8 mg/dL — ABNORMAL LOW (ref 8.9–10.3)
Chloride: 102 mmol/L (ref 98–111)
Creatinine, Ser: 1.13 mg/dL (ref 0.61–1.24)
GFR, Estimated: >60 mL/min (ref >60)
Glucose, Bld: 116 mg/dL — ABNORMAL HIGH (ref 70–99)
Potassium: 4.8 mmol/L (ref 3.5–5.1)
Sodium: 142 mmol/L (ref 135–145)

## 2021-09-29 LAB — CBC
HCT: 33.6 % — ABNORMAL LOW (ref 39.0–52.0)
Hemoglobin: 11 g/dL — ABNORMAL LOW (ref 13.0–17.0)
MCH: 31.3 pg (ref 26.0–34.0)
MCHC: 32.7 g/dL (ref 30.0–36.0)
MCV: 95.7 fL (ref 80.0–100.0)
Platelets: 148 10*3/uL — ABNORMAL LOW (ref 150–400)
RBC: 3.51 MIL/uL — ABNORMAL LOW (ref 4.22–5.81)
RDW: 17.4 % — ABNORMAL HIGH (ref 11.5–15.5)
WBC: 6.2 10*3/uL (ref 4.0–10.5)
nRBC: 0 % (ref 0.0–0.2)

## 2021-09-29 LAB — MAGNESIUM: Magnesium: 2 mg/dL (ref 1.7–2.4)

## 2021-09-29 MED ORDER — ORAL CARE MOUTH RINSE
15.0000 mL | OROMUCOSAL | Status: DC | PRN
Start: 2021-09-29 — End: 2021-10-05

## 2021-09-29 MED ORDER — DIGOXIN 125 MCG PO TABS
0.1250 mg | ORAL_TABLET | Freq: Every day | ORAL | Status: DC
  Administered 2021-09-29 – 2021-10-04 (×6): 0.125 mg via ORAL
  Filled 2021-09-29 (×6): qty 1

## 2021-09-29 MED ORDER — WARFARIN SODIUM 2.5 MG PO TABS
2.5000 mg | ORAL_TABLET | Freq: Once | ORAL | Status: AC
  Administered 2021-09-29: 2.5 mg via ORAL

## 2021-09-29 MED ORDER — FUROSEMIDE 10 MG/ML IJ SOLN
80.0000 mg | Freq: Two times a day (BID) | INTRAMUSCULAR | Status: DC
  Administered 2021-09-29 – 2021-10-04 (×10): 80 mg via INTRAVENOUS
  Filled 2021-09-29 (×10): qty 8

## 2021-09-29 MED ORDER — WARFARIN SODIUM 5 MG PO TABS
5.0000 mg | ORAL_TABLET | Freq: Once | ORAL | Status: DC
Start: 2021-09-29 — End: 2021-09-29

## 2021-09-29 MED ORDER — INSULIN ASPART 100 UNIT/ML IJ SOLN: 0.0000 [IU] | Freq: Three times a day (TID) | INTRAMUSCULAR | Status: DC

## 2021-09-29 MED ORDER — METOPROLOL SUCCINATE ER 50 MG PO TB24
50.0000 mg | ORAL_TABLET | Freq: Every day | ORAL | Status: DC
  Administered 2021-09-29 – 2021-10-03 (×5): 50 mg via ORAL
  Filled 2021-09-29 (×5): qty 1

## 2021-09-29 MED ORDER — FUROSEMIDE 10 MG/ML IJ SOLN: 60.0000 mg | Freq: Two times a day (BID) | INTRAMUSCULAR | Status: DC

## 2021-09-29 MED ORDER — FUROSEMIDE 10 MG/ML IJ SOLN: 80.0000 mg | Freq: Two times a day (BID) | INTRAMUSCULAR | Status: DC

## 2021-09-29 NOTE — TOC Initial Note (Signed)
Transition of Care [facility]) - Initial/Assessment Note    Patient Details  Name: Gregory [name] MRN: [id number] Date of Birth: 08-07-1940  Transition of Care [facility]) CM/SW Contact:    Bethann Berkshire, [place] Phone Number: 09/29/2021, 11:39 AM  Clinical Narrative:     CSW notified that pt niece wanting to speak. CSW met with pt and pt's Niece Threasa Beards. Threasa Beards is seeking insight into plans for pt's ongoing care. Pt lives home alone in [place] and has been having a decline in mobility. He has been using a walker recently and Threasa Beards reports pt has had at least 3 falls. Melanie lives in [place] and other niece Claiborne Billings lives 2 hours away. Pt has neighbors who check in on him and also has a nurse that comes about once a month through [place]. Threasa Beards has been speaking with [facility] about potential for pt to move there. She states pt has a LTC plan. CSW explains that hospital does not arrange ALF though that hey would assist with STR at SNF if pt needs it. CSW explains SNF process and medicare insurance process/coverage. PT is ordered but has not seen pt yet. CSW will follow for PT recs. Daughter and pt agree to SNF workup if recommended by PT.      Barriers to Discharge: Continued Medical Work up   Patient Goals and CMS Choice        Expected Discharge Plan and Services         Living arrangements for the past 2 months: Single Family Home                                      Prior Living Arrangements/Services Living arrangements for the past 2 months: Single Family Home Lives with:: Self          Need for Family Participation in Patient Care: Yes (Comment) Care giver support system in place?: Yes (comment) Current home services: Home RN    Activities of Daily Living      Permission Sought/Granted   Permission granted to share information with : Yes, Verbal Permission Granted  Share Information with NAME: Jamesetta Orleans (Niece)   [phone] (Mobile)            Emotional Assessment Appearance:: Appears stated age Attitude/Demeanor/Rapport: Engaged Affect (typically observed): Accepting Orientation: : Oriented to Self, Oriented to Place, Oriented to  Time, Oriented to Situation Alcohol / Substance Use: Not Applicable Psych Involvement: No (comment)  Admission diagnosis:  Anasarca [R60.1] Acute on chronic diastolic CHF (congestive heart failure) (HCC) [I50.33] Patient Active Problem List   Diagnosis Date Noted   Acute on chronic diastolic CHF (congestive heart failure) ([place]) 09/28/2021   Acute on chronic right heart failure ([place]) 09/28/2021   Acute on chronic diastolic heart failure ([place]) 09/16/2019   S/P TAVR (transcatheter aortic valve replacement) 09/16/2019   Mitral valve disease    Hypertension    Chronic atrial fibrillation ([place])    Diabetes mellitus type 2, noninsulin dependent ([place])    Severe aortic stenosis 05/25/2014   Mixed hyperlipidemia 08/26/2013   Obesity 08/26/2013   Essential hypertension, benign 08/26/2013   Non Hodgkin's lymphoma ([place]) 05/06/2012   PCP:  Kathyrn Lass, MD Pharmacy:   [facility], [address] [place] [address] Phone: [phone] Fax: [phone]     Social Determinants of Health (SDOH) Interventions  Readmission Risk Interventions    09/19/2019   12:12 PM  Readmission Risk Prevention Plan  Transportation Screening Complete  PCP or Specialist Appt within 5-7 Days Complete  Home Care Screening Complete  Medication Review (RN CM) Complete

## 2021-09-29 NOTE — Consult Note (Addendum)
Cardiology Consultation:   Patient ID: Gregory [name] MRN: [id number]; DOB: [birth year]  Admit date: 09/27/2021 Date of Consult: 09/29/2021  PCP:  Gregory Lass, MD   [facility] Providers Cardiologist:  Gregory Grooms, MD  Electrophysiologist:  Gregory Haw, MD    Patient Profile:   Gregory [name] is a 81 y.o. male with a hx of permanent atrial fibrillation on warfarin, chronic diastolic heart failure, severe AS s/p TAVR, history of complete heart block s/p PPM, severe mitral regurgitation, moderate-severe mitral stenosis, type 2 DM, non-hodgkin lymphoma, HTN who is being seen 09/29/2021 for the evaluation of CHF at the request of Gregory [name].  History of Present Illness:   Gregory [name] is an 81 year old male with above medical history who is followed by Gregory [name]. Per chart review, patient has been followed long-term for atrial fibrillation, valvular disease.  In 2021, patient had progressed to having severe AS and underwent cardiac catheterization on 08/2019 without significant CAD.  Patient underwent dental extractions, later underwent TAVR in 09/2019.  Patient's postoperative course was complicated by complete heart block requiring a Medtronic Axure XT DR MRI SureScan pacemaker 09/17/2019.  Of note, patient had left-sided subclavian occlusion and required the pacemaker to be implanted on the right side.  After his procedure, patient was resumed on Coumadin with the addition of a baby aspirin for 6 months.  Follow-up echocardiogram in 10/2020 showed that his EF was 55-60%, there was mild LVH, severely elevated PA pressure, severe LAE, mild-moderate LAE, moderate MR, moderate-severe mitral stenosis, normal TAVR prosthetic function, dilated IVC.  There was no significant change from prior post TAVR echocardiogram in 10/2019.   Patient was last seen by cardiology in 02/22/2021.  At that time, patient was doing well and denied any dyspnea, chest pain, orthopnea.  Patient did have some mild  intermittent edema for which he used Lasix as needed.  Patient had a follow up echocardiogram on 09/22/21 that showed EF 45%, severely reduced RV systolic function, moderate-severe dilation of the of the left atrial, severely dilated RA.   Patient presented to the ED on 6/27 complaining of worsening edema in the upper and lower extremities, exertional dyspnea. Patient had been noncompliant with his lasix. Labs int he ED showed Na 138, K 5.2, creatinine 1.09, WBC 7.4, hemoglobin 11.3, platelets 170.   CXR showed bilateral pleural effusions and bibasilar opacities, mild vascular congestion, subtle Kerley B-lines/pulmonary edema. hsTn 42>>45. BNP was elevated to 354.5. EKG showed a ventricular paced rhythm, HR 61 BPM. Cardiology was asked to consult for CHF.   On interview, patient reports that he has had intermittent arm and leg swelling for many years. Over the past 2-3 weeks, swelling has gotten significantly worse. Progressed to where his whole arms and legs were swollen. Also noticed a decreased appetite and early satiety. Some sob on exertion, denies orthopnea. Believes his dry weight is around 245 lbs, but he is not sure. Denies chest pain, denies cough.   Past Medical History:  Diagnosis Date   Amputation finger    Distal 4th & 5th digits of right hand   Arthritis    Chronic atrial fibrillation (HCC)    Chronic diastolic CHF (congestive heart failure) (HCC)    Diabetes mellitus type 2, noninsulin dependent (HCC)    History of blood transfusion    5 pints for peptic ulcer   History of kidney stones    Passed x 2   Hypertension    Mitral valve disease    mild-moderate MR, moderate  MS 07/15/19 echo   OSA (obstructive sleep apnea)    Other malignant lymphomas, unspecified site, extranodal and solid organ sites    Non Hodkins Lymothopathy   Ruptured disk    S/P TAVR (transcatheter aortic valve replacement) 09/16/2019   s/p TAVR with a 29 mm Edwards Sapien 3 THV via the TF approach by Gregory [name] and Gregory [name]   Severe aortic stenosis    Ulcer    peptic Ulcer - bleeding    Past Surgical History:  Procedure Laterality Date   APPENDECTOMY  1967   COLONOSCOPY     left knee surgery     left shoulder surgery     MULTIPLE EXTRACTIONS WITH ALVEOLOPLASTY N/A 09/04/2019   Procedure: Extraction of tooth #'s 2,7,8,9,11,13,14 20,30, and 31 with alveoloplatsty and gross debridement of remaining teeth;  Surgeon: Gregory [name], DDS;  Location: [facility];  Service: Oral Surgery;  Laterality: N/A;   PACEMAKER IMPLANT N/A 09/17/2019   Procedure: PACEMAKER IMPLANT;  Surgeon: Gregory Haw, MD;  Location: [facility];  Service: Cardiovascular;  Laterality: N/A;   RIGHT/LEFT HEART CATH AND CORONARY ANGIOGRAPHY N/A 08/12/2019   Procedure: RIGHT/LEFT HEART CATH AND CORONARY ANGIOGRAPHY;  Surgeon: Gregory Booze, MD;  Location: [facility];  Service: Cardiovascular;  Laterality: N/A;   TEE WITHOUT CARDIOVERSION N/A 09/16/2019   Procedure: TRANSESOPHAGEAL ECHOCARDIOGRAM (TEE);  Surgeon: Gregory Mocha, MD;  Location: [facility];  Service: Open Heart Surgery;  Laterality: N/A;   TONSILLECTOMY     TRANSCATHETER AORTIC VALVE REPLACEMENT, TRANSFEMORAL N/A 09/16/2019   Procedure: TRANSCATHETER AORTIC VALVE REPLACEMENT, TRANSFEMORAL;  Surgeon: Gregory Mocha, MD;  Location: [facility];  Service: Open Heart Surgery;  Laterality: N/A;     Home Medications:  Prior to Admission medications   Medication Sig Start Date End Date Taking? Authorizing Provider  acetaminophen (TYLENOL) 650 MG CR tablet Take 650 mg by mouth every 8 (eight) hours as needed for pain.   Yes [provider]  atorvastatin (LIPITOR) 20 MG tablet Take 20 mg by mouth daily. 04/22/12  Yes [provider]  cyanocobalamin 1000 MCG tablet Take 1,000 mcg by mouth daily.   Yes [provider]  diltiazem (CARDIZEM CD) 120 MG 24 hr capsule Take 120 mg by mouth at bedtime. 06/03/15   Yes [provider]  ketoconazole (NIZORAL) 2 % cream Apply 1 Application topically 2 (two) times daily as needed for irritation.   Yes [provider]  lisinopril (PRINIVIL,ZESTRIL) 10 MG tablet Take 10 mg by mouth at bedtime. 04/08/18  Yes [provider]  metFORMIN (GLUCOPHAGE-XR) 500 MG 24 hr tablet Take 1 tablet (500 mg total) by mouth 2 (two) times daily. Patient taking differently: Take 500 mg by mouth daily with breakfast. 08/14/19  Yes Gregory Booze, MD  metoprolol succinate (TOPROL-XL) 100 MG 24 hr tablet Take 100 mg by mouth at bedtime. Take with or immediately following a meal.   Yes [provider]  Multiple Vitamins-Minerals (PRESERVISION AREDS 2 PO) Take 1 tablet by mouth daily.   Yes [provider]  pioglitazone (ACTOS) 15 MG tablet Take 15 mg by mouth daily.    Yes [provider]  tamsulosin (FLOMAX) 0.4 MG CAPS capsule Take 0.4 mg by mouth daily. 09/16/20  Yes [provider]  warfarin (COUMADIN) 5 MG tablet Take 2.5 mg by mouth at bedtime.   Yes [provider]  furosemide (LASIX) 40 MG tablet Take 1 tablet (40  mg total) by mouth daily as needed. For swelling Patient not taking: Reported on 09/28/2021 10/02/19   Eileen Stanford, PA-C  ONE TOUCH ULTRA TEST test strip 1 each by Other route as needed (BLOOD SUGAR).  07/14/15   [provider]  Jonetta Speak LANCETS 50K MISC Apply 1 each topically daily as needed (BLOOD SUGAR).  07/14/15   [provider]    Inpatient Medications: Scheduled Meds:  atorvastatin  20 mg Oral Daily   diltiazem  120 mg Oral QHS   furosemide  40 mg Intravenous Q12H   insulin aspart  0-5 Units Subcutaneous QHS   insulin aspart  0-6 Units Subcutaneous TID WC   metoprolol succinate  100 mg Oral QHS   sodium chloride flush  3 mL Intravenous Q12H   tamsulosin  0.4 mg Oral Daily   warfarin  2.5 mg Oral ONCE-1600   Warfarin - Pharmacist Dosing Inpatient   Does  not apply q1600   Continuous Infusions:  PRN Meds: acetaminophen **OR** acetaminophen, mouth rinse, senna-docusate  Allergies:    Allergies  Allergen Reactions   Aspirin     visual issues and fatigue   Macrobid [Nitrofurantoin]    Nystatin    Prevnar 13 [Pneumococcal 13-Val Conj Vacc]    Bisoprolol-Hydrochlorothiazide Other (See Comments)    "Severe" arthralgias and myalgias   Nsaids Other (See Comments)    GI upset, Peptic ulcer disease    Social History:   Social History   Socioeconomic History   Marital status: Single    Spouse name: Not on file   Number of children: 0   Years of education: Not on file   Highest education level: High school graduate  Occupational History   Occupation: Retired-Trucking  Tobacco Use   Smoking status: Never   Smokeless tobacco: Never  Vaping Use   Vaping Use: Never used  Substance and Sexual Activity   Alcohol use: No   Drug use: No   Sexual activity: Never  Other Topics Concern   Not on file  Social History Narrative   Not on file   Social Determinants of Health   Financial Resource Strain: Low Risk  (09/29/2021)   Overall Financial Resource Strain (CARDIA)    Difficulty of Paying Living Expenses: Not very hard  Food Insecurity: No Food Insecurity (09/29/2021)   Hunger Vital Sign    Worried About Running Out of Food in the Last Year: Never true    Ran Out of Food in the Last Year: Never true  Transportation Needs: No Transportation Needs (09/29/2021)   PRAPARE - Hydrologist (Medical): No    Lack of Transportation (Non-Medical): No  Physical Activity: Not on file  Stress: Not on file  Social Connections: Not on file  Intimate Partner Violence: Not on file    Family History:    Family History  Problem Relation Age of Onset   Hypertension Father    Heart attack Father    Heart attack Mother    Cancer Mother        LUNG   Stroke Neg Hx      ROS:  Please see the history of present  illness.   All other ROS reviewed and negative.     Physical Exam/Data:   Vitals:   09/29/21 0045 09/29/21 0320 09/29/21 0719 09/29/21 1115  BP:  (!) 97/41 (!) 116/56 (!) 109/55  Pulse:  66 60 60  Resp:  '16 20 16  '$ Temp:  97.6  F (36.4 C) (!) 97.2 F (36.2 C) (!) 97.5 F (36.4 C)  TempSrc:  Oral Axillary Oral  SpO2: 90% 93% 93% 92%  Weight:  117.2 kg      Intake/Output Summary (Last 24 hours) at 09/29/2021 1711 Last data filed at 09/29/2021 1429 Gross per 24 hour  Intake 775 ml  Output 2850 ml  Net -2075 ml      09/29/2021    3:20 AM 02/22/2021    1:57 PM 10/20/2020    1:23 PM  Last 3 Weights  Weight (lbs) 258 lb 6.1 oz 257 lb 3.2 oz 247 lb 12.8 oz  Weight (kg) 117.2 kg 116.665 kg 112.401 kg     Body mass index is 37.07 kg/m.  General:  Pleasant elderly gentleman in no acute distress. Sitting upright in the bed  HEENT: normal Neck: JVD + Vascular: Radial pulses 2+ bilaterally Cardiac:  normal S1, S2; RRR; systolic murmur present  Lungs:  Diminished breath sounds in bilateral lung bases  Abd: soft, nontender, distended Ext: 3+ pitting edema in BLE to the hips. 2+ pitting edema in BUE to the elbows.  Musculoskeletal:  No deformities, BUE and BLE strength normal and equal Skin: warm and dry  Neuro:  CNs 2-12 intact, no focal abnormalities noted Psych:  Normal affect   EKG:  The EKG was personally reviewed and demonstrates:  Ventricular paced rhythm, HR 60 Telemetry:  Telemetry was personally reviewed and demonstrates:  V-paced rhythm, HR in the 60s   Relevant CV Studies:  Echocardiogram 09/22/21   1. Distal inferolateral and apical hypokinesis. . Left ventricular  ejection fraction, by estimation, is 45%%. The left ventricle has mildly  decreased function. The left ventricular internal cavity size was mildly  dilated.   2. Right ventricular systolic function is severely reduced. The right  ventricular size is moderately enlarged. There is moderately elevated   pulmonary artery systolic pressure.   3. Left atrial size was moderate to severely dilated.   4. Right atrial size was severely dilated.   5. Mitral valve is thickened with severe annular calcifications. Peak and  mean gradient through the valve are 24 and 7 mm Hg respectively.  Consistent with at least moderate to severe MS Mitral regurgitant jet is  directed posterior into LA. Vena  contracta is 0.74; MR volume 55 ml OVerall consistent with severe MR. .  Severe mitral valve regurgitation. Severe mitral annular calcification.   6. S/P TAVR (29 mm Sapien prosthesis, implant date 09/16/19) Leaflets  appear to open well Peak and mean gradients through the valve are 17 and 9  mm Hg respectively AVA (VTI) is 1.8 cm2.      Mild perivalvular AI. No signficant change from echo in July 2022. The  aortic valve has been repaired/replaced. Aortic valve regurgitation is  mild. There is a 29 mm Sapien prosthetic (TAVR) valve present in the  aortic position. Procedure Date:  09/16/19.   7. Aortic dilatation noted. There is mild dilatation of the ascending  aorta, measuring 40 mm.   8. The inferior vena cava is dilated in size with <50% respiratory  variability, suggesting right atrial pressure of 15 mmHg.   Comparison(s): 10/20/20 EF 55-60%. Moderate MR. Moderate-severe MS 101mHg  mean PG. AV 665mg mean PG, 1025m peak PG.   Laboratory Data:  High Sensitivity Troponin:   Recent Labs  Lab 09/27/21 1952 09/27/21 2209  TROPONINIHS 42* 45*     Chemistry Recent Labs  Lab 09/27/21 1952 09/28/21 1202 09/29/21 [id number]  NA 138 138 142  K 5.2* 4.7 4.8  CL 105 104 102  CO2 '23 27 29  '$ GLUCOSE 119* 151* 116*  BUN 39* 38* 35*  CREATININE 1.09 1.11 1.13  CALCIUM 8.8* 8.7* 8.8*  MG  --   --  2.0  GFRNONAA >60 >60 >60  ANIONGAP '10 7 11    '$ Recent Labs  Lab 09/27/21 1952  PROT 5.7*  ALBUMIN 3.3*  AST 28  ALT 19  ALKPHOS 120  BILITOT 0.8   Lipids No results for input(s): "CHOL", "TRIG", "HDL",  "LABVLDL", "LDLCALC", "CHOLHDL" in the last 168 hours.  Hematology Recent Labs  Lab 09/27/21 1952 09/29/21 0642  WBC 7.4 6.2  RBC 3.63* 3.51*  HGB 11.3* 11.0*  HCT 35.6* 33.6*  MCV 98.1 95.7  MCH 31.1 31.3  MCHC 31.7 32.7  RDW 17.4* 17.4*  PLT 170 148*   Thyroid No results for input(s): "TSH", "FREET4" in the last 168 hours.  BNP Recent Labs  Lab 09/27/21 1952  BNP 354.5*    DDimer No results for input(s): "DDIMER" in the last 168 hours.   Radiology/Studies:  DG Chest 2 View  Result Date: 09/27/2021 CLINICAL DATA:  Shortness of breath. EXAM: CHEST - 2 VIEW COMPARISON:  Radiograph 09/18/2019 FINDINGS: Right-sided pacemaker remains in place. Similar cardiomegaly. TAVR. There are bilateral pleural effusions and bibasilar opacities, likely representing atelectasis, left greater than right. Mild vascular congestion. Subtle Kerley B-lines/pulmonary edema. No pneumothorax. No acute osseous findings. IMPRESSION: Mild to moderate CHF. Electronically Signed   By: Keith Rake M.D.   On: 09/27/2021 20:31     Assessment and Plan:   Acute on chronic right heart failure  Moderate moderate-severe mitral stenosis  Severe mitral valve regurgitation   Acute on Chronic HFrEF  Pulmonary Artery Hypertension  - Most recent echocardiogram on 09/22/21 that showed EF 45%, severely reduced RV systolic function, moderately elevated pulmonary artyer systolic pressure, moderate-severe dilatation of the LA, severely dilated RA, moderate-severe MS, severe mitral valve regurgitation  - Cath in 2021 without significant CAD-- suspect pulmonary HTN, RV failure are multifactorial and due to mitral valve disease, history of severe OSA (not on CPAP due to intolerance), history of LV dysfunction  - Now presenting with upper and lower extremity edema, sob on exertion. Reports not taking lasix in >1 month  - So far, patient has been given one dose of IV lasix 80 mg and 3 doses of IV lasix 40 mg. Output 4 L urine  yesterday and is currently net -3.8 L since admission  - Increase lasix to 80 mg IV BID - Continue metoprolol 100 mg daily  - Stop diltiazem given severely reduced RV function  - BP soft, will be difficult to add GDMT at this time  - Consider digoxin to support RV function  - Per chart review-- most recent echocardiogram was reviewed by the structural heart team. Planned to continue routine clinical surveillance. However, if he developed significant symptoms, he could be referred out for consideration of valve repair  - Given that he is now admitted with significant upper and lower extremity edema, SOB-- may benefit from valve repair. Likely not a surgical candidate. However, patient also reported noncompliance with lasix. Would be reasonable to attempt medical management before considering valve repair   Permanent Atrial Fibrillation - Per telemetry-- HR in the 60s  - Continue metoprolol 100 mg daily  - Continue warfarin  - Hold diltiazem as above   CHB s/p Medtronic PPM  - Normal device  functioning on last remote check from 09/15/21 - Continue to follow with EP outpatient   Severe AS s/p TAVR  - Echocardiogram showed mild perivalvular AI, unchanged from previous echo. TAVR valve present in the proper position   Risk Assessment/Risk Scores:      [name] (NYHA) Functional Class NYHA Class III  CHA2DS2-VASc Score = 6   This indicates a 9.7% annual risk of stroke. The patient's score is based upon: CHF History: 1 HTN History: 1 Diabetes History: 1 Stroke History: 0 Vascular Disease History: 1 Age Score: 2 Gender Score: 0         For questions or updates, please contact [facility] Please consult [email or web address] for contact info under    Signed, Margie Billet, PA-C  09/29/2021 5:11 PM  Patient seen and examined and agree with Vikki Ports, PA-C as detailed above.  In brief, the patient is a 81 y.o. male with a hx of permanent atrial  fibrillation on warfarin, chronic diastolic heart failure, severe AS s/p TAVR, history of complete heart block s/p PPM, severe mitral regurgitation, moderate-severe mitral stenosis, type 2 DM, non-hodgkin lymphoma, and HTN who presented to the ER with anasarca found to have acute on chronic diastolic heart failure and mainly right sided heart failure for which Cardiology was consulted.  Patient has extensive CV history. Has history of severe aortic stenosis s/p TAVR in 09/2019. Post-TAVR course complicated by CHB requiring PPM placement. Most recent TTE in 09/22/21 showed LVEF had dropped to 45% (from 55-60% in 10/2020) with moderate RV dilation with severe RV dysfunction, moderate-to-severe calcific MS, severe MR, moderate pulmonary HTN, normal functioning TAVR valve with mild perivalvular AI, mildly dilated aorta 9m, and RAP 153mg.  He presents on this admission with massive volume overload with predominantly right sided symptoms. Notably, he has not been taking his lasix at home.  Trop 42>45, BNP 354. CXR with mild vascular congestion.   Has been responding well to IV diuresis and was net negative 3.8L yesterday.  Cr stable at 1.13. Will continue with aggressive diuresis and monitor response. Unfortunately, his mitral valve disease is likely not treatable and will need to be managed medically as he is not a surgical candidate. Hopefully, his symptoms will improve significantly with diuresis.   GEN: Comfortable, NAD  Neck: JVD to mid-neck Cardiac: Irregular, 2/6 systolic murmur, 1/6 diastolic murmur Respiratory: Bibasilar crackles with scattered expiratory wheezing GI: Soft, nontender, non-distended  MS: Grossly anasarcic, warm Neuro:  Nonfocal  Psych: Normal affect    Plan: -Patient is grossly overloaded with both left and mainly right sided symptoms -Continue lasix '80mg'$  IV BID and monitor response -Will decrease metop to '50mg'$  XL daily given degree of volume overload and RV dysfunction;  ideally would like to maintain HR in 60s given mitral stenosis  -Start digoxin 0.'125mg'$  daily for RV support and afib -[facility] warfar[facility], likely MV will need to be managed medically given MS and severe MR; patient is not a surgical candidate  HeGwyndolyn KaufmanMD

## 2021-09-29 NOTE — Progress Notes (Signed)
Heart Failure Nurse Navigator Progress Note  PCP: Kathyrn Lass, MD PCP-Cardiologist: Idolina Primer Admission Diagnosis: Anasacra Admitted from: Home  Presentation:   Gregory [name] presented with shortness of breath with exertion, worsening edema to both upper and lower extremities, and hasn't taken his lasix in 1 month. BP 115/53, HR 59, edema up to his chest. BNP 354, Troponin 42, IV lasix given. Admitted for Anasarca.   Patient and niece were educated on the sign and symptoms of heart failure, daily weights, when to call his doctor or go to the ER. Diet/ fluids restrictions. ( Patient states that he uses salt on a lot, including his watermelon, cantaloupe, cucumbers, etc, and uses can foods as well as drinks some soda.) Educated on the importance of taking all his medications as prescribed including his lasix, and attending all his medical appointments. Both patient and niece were active and asked a number of questions. Patient is scheduled for a HF TOC hospital follow up appointment on 10/12/2021 @ 3 Pm.   ECHO/ LVEF: 45%  Clinical Course:  Past Medical History:  Diagnosis Date   Amputation finger    Distal 4th & 5th digits of right hand   Arthritis    Chronic atrial fibrillation (HCC)    Chronic diastolic CHF (congestive heart failure) (HCC)    Diabetes mellitus type 2, noninsulin dependent (HCC)    History of blood transfusion    5 pints for peptic ulcer   History of kidney stones    Passed x 2   Hypertension    Mitral valve disease    mild-moderate MR, moderate MS 07/15/19 echo   OSA (obstructive sleep apnea)    Other malignant lymphomas, unspecified site, extranodal and solid organ sites    Non Hodkins Lymothopathy   Ruptured disk    S/P TAVR (transcatheter aortic valve replacement) 09/16/2019   s/p TAVR with a 29 mm Edwards Sapien 3 THV via the TF approach by Dr. Burt Knack and Dr. Cyndia Bent   Severe aortic stenosis    Ulcer    peptic Ulcer - bleeding     Social History    Socioeconomic History   Marital status: Single    Spouse name: Not on file   Number of children: 0   Years of education: Not on file   Highest education level: High school graduate  Occupational History   Occupation: Retired-Trucking  Tobacco Use   Smoking status: Never   Smokeless tobacco: Never  Vaping Use   Vaping Use: Never used  Substance and Sexual Activity   Alcohol use: No   Drug use: No   Sexual activity: Never  Other Topics Concern   Not on file  Social History Narrative   Not on file   Social Determinants of Health   Financial Resource Strain: Low Risk  (09/29/2021)   Overall Financial Resource Strain (CARDIA)    Difficulty of Paying Living Expenses: Not very hard  Food Insecurity: No Food Insecurity (09/29/2021)   Hunger Vital Sign    Worried About Running Out of Food in the Last Year: Never true    Ran Out of Food in the Last Year: Never true  Transportation Needs: No Transportation Needs (09/29/2021)   PRAPARE - Hydrologist (Medical): No    Lack of Transportation (Non-Medical): No  Physical Activity: Not on file  Stress: Not on file  Social Connections: Not on file   Education Assessment and Provision:  Detailed education and instructions provided on heart failure  disease management including the following:  Signs and symptoms of Heart Failure When to call the physician Importance of daily weights Low sodium diet Fluid restriction Medication management Anticipated future follow-up appointments  Patient education given on each of the above topics.  Patient acknowledges understanding via teach back method and acceptance of all instructions.  Education Materials:  "Living Better With Heart Failure" Booklet, HF zone tool, & Daily Weight Tracker Tool.  Patient has scale at home: Yes Patient has pill box at home: Yes    High Risk Criteria for Readmission and/or Poor Patient Outcomes: Heart failure hospital admissions  (last 6 months): 0  No Show rate: 4 % Difficult social situation: No Demonstrates medication adherence: Yes Primary Language: English Literacy level: Reading, writing, and comprehension.   Barriers of Care:   Diet/ fluids ( salt on all food, soda, can goods) Daily weights Didn't take his lasix x 1 month  Considerations/Referrals:   Referral made to Heart Failure Pharmacist Stewardship: Yes Referral made to Heart Failure CSW/NCM TOC: No Referral made to Heart & Vascular TOC clinic: Yes, 10/12/2021 @ 3 pm.   Items for Follow-up on DC/TOC: Diet/ fluids ( Salt usage, likes it on his fruits, veggies, etc, uses can good, and drinks soda) Daily weights   Earnestine Leys, BSN, RN Heart Failure Transport planner Only

## 2021-09-29 NOTE — Progress Notes (Addendum)
[facility] for Coumadin Indication: atrial fibrillation  Allergies  Allergen Reactions   Aspirin     visual issues and fatigue   Macrobid [Nitrofurantoin]    Nystatin    Prevnar 13 [Pneumococcal 13-Val Conj Vacc]    Bisoprolol-Hydrochlorothiazide Other (See Comments)    "Severe" arthralgias and myalgias   Nsaids Other (See Comments)    GI upset, Peptic ulcer disease   Vital Signs: Temp: 97.2 F (36.2 C) (06/29 0719) Temp Source: Axillary (06/29 0719) BP: 116/56 (06/29 0719) Pulse Rate: 60 (06/29 0719)  Labs: Recent Labs    09/27/21 1952 09/27/21 2209 09/28/21 0040 09/28/21 1202 09/29/21 0642 09/29/21 1103  HGB 11.3*  --   --   --  11.0*  --   HCT 35.6*  --   --   --  33.6*  --   PLT 170  --   --   --  148*  --   LABPROT  --   --  21.3*  --   --  20.6*  INR  --   --  1.9*  --   --  1.8*  CREATININE 1.09  --   --  1.11 1.13  --   TROPONINIHS 42* 45*  --   --   --   --     Medical History: Past Medical History:  Diagnosis Date   Amputation finger    Distal 4th & 5th digits of right hand   Arthritis    Chronic atrial fibrillation (HCC)    Chronic diastolic CHF (congestive heart failure) (HCC)    Diabetes mellitus type 2, noninsulin dependent (HCC)    History of blood transfusion    5 pints for peptic ulcer   History of kidney stones    Passed x 2   Hypertension    Mitral valve disease    mild-moderate MR, moderate MS 07/15/19 echo   OSA (obstructive sleep apnea)    Other malignant lymphomas, unspecified site, extranodal and solid organ sites    Non Hodkins Lymothopathy   Ruptured disk    S/P TAVR (transcatheter aortic valve replacement) 09/16/2019   s/p TAVR with a 29 mm Edwards Sapien 3 THV via the TF approach by Dr. Burt Knack and Dr. Cyndia Bent   Severe aortic stenosis    Ulcer    peptic Ulcer - bleeding    Assessment: 81yo male c/o worsening edema of BUE/BLE with exertional dyspnea, admitted for acute on chronic HF, to  continue Coumadin for Afib; current INR below goal at 1.8. No bleeding issues noted.   Patient refused extra warfarin tonight. We discussed the reason for the extra dose but he would prefer to continue his home dose of 2.'5mg'$  daily at this time. We discussed if his INR doesn't respond in the next day or so he should take extra.   Goal of Therapy:  INR 2-3   Plan:  Coumadin 2.'5mg'$  PO x1 today Monitor INR for dose adjustments.  Erin Hearing PharmD., BCPS Clinical Pharmacist 09/29/2021 12:13 PM

## 2021-09-29 NOTE — Progress Notes (Signed)
[facility]                                                                               Gregory [name], is a 81 y.o. male, DOB - Jun 01, 1940, VVO:[id number] Admit date - 09/27/2021    Outpatient Primary MD for the patient is Kathyrn Lass, MD  LOS - 1  days    Brief summary  Gregory [name] is a pleasant 81 y.o. male with medical history significant for atrial fibrillation on warfarin, chronic diastolic CHF, chronic right-sided heart failure, severe AS status post TAVR, severe mitral regurgitation, moderate-severe mitral stenosis, type 2 diabetes mellitus, non-Hodgkin lymphoma, and hypertension, now presenting to the emergency department for evaluation of swelling and worsening exertional dyspnea. EKG features a ventricular paced rhythm with RBBB.  Mild to moderate CHF changes noted on chest x-ray.     Assessment & Plan    Acute on chronic systolic heart failure:  With severe MR, moderate- severe MS - Echo Showed LVEF 45% with severely reduced RV systolic function, severe MR, mod severe MS .  Cardiology consulted for recommendations.  Continue with IV lasix 80 mg IV BID.  Continue with strict intake and output.  Daily weights.    Atrial fibrillation rate controlled.  Continue with metoprolol.  Continue with coumadin.     Type 2 DM with hyperglycemia:  CBG (last 3)  Recent Labs    09/29/21 0552 09/29/21 1045 09/29/21 1547  GLUCAP 118* 130* 126*   Resume SSI.    Stage 2 CKD and hyperkalemia:  Creatinine stable   Potassium is stable this am.    Assessment and Plan: Code Status: DNR DVT Prophylaxis:   warfarin (COUMADIN) tablet 2.5 mg   Level of Care: Level of care: Telemetry Cardiac Family Communication: Updated patients' family at bedside.   Disposition Plan:     Remains inpatient appropriate:  IV Lasix.   Procedures:  None.   Consultants:   Cardiology.   Antimicrobials:   Anti-infectives (From admission, onward)    None         Medications  Scheduled Meds:  atorvastatin  20 mg Oral Daily   diltiazem  120 mg Oral QHS   furosemide  40 mg Intravenous Q12H   insulin aspart  0-5 Units Subcutaneous QHS   insulin aspart  0-6 Units Subcutaneous TID WC   metoprolol succinate  100 mg Oral QHS   sodium chloride flush  3 mL Intravenous Q12H   tamsulosin  0.4 mg Oral Daily   warfarin  2.5 mg Oral ONCE-1600   Warfarin - Pharmacist Dosing Inpatient   Does not apply q1600   Continuous Infusions: PRN Meds:.acetaminophen **OR** acetaminophen, mouth rinse, senna-docusate    Subjective:   [name] was seen and examined today.  Breathing is improving.  Objective:   Vitals:   09/29/21 0045 09/29/21 0320 09/29/21 0719 09/29/21 1115  BP:  (!) 97/41 (!) 116/56 (!) 109/55  Pulse:  66 60 60  Resp:  '16 20 16  '$ Temp:  97.6 F (36.4 C) (!) 97.2 F (36.2 C) (!) 97.5 F (36.4 C)  TempSrc:  Oral Axillary Oral  SpO2: 90% 93% 93% 92%  Weight:  117.2 kg      Intake/Output Summary (Last 24 hours) at 09/29/2021 1523 Last data filed at 09/29/2021 1429 Gross per 24 hour  Intake 775 ml  Output 3250 ml  Net -2475 ml   Filed Weights   09/29/21 0320  Weight: 117.2 kg     Exam General exam: Appears calm and comfortable  Respiratory system: Clear to auscultation. Respiratory effort normal. Cardiovascular system: S1 & S2 heard, JVD present, bilateral leg edema 2+ Gastrointestinal system: Abdomen is nondistended, soft and nontender.  Normal bowel sounds heard. Central nervous system: Alert and oriented. No focal neurological deficits. Extremities: Symmetric 5 x 5 power. Skin: No rashes, lesions or ulcers Psychiatry:  Mood & affect appropriate.    Data Reviewed:  I have personally reviewed following labs and imaging studies   CBC Lab Results  Component Value Date   WBC 6.2 09/29/2021   RBC 3.51 (L) 09/29/2021   HGB 11.0 (L) 09/29/2021   HCT 33.6 (L) 09/29/2021   MCV 95.7 09/29/2021   MCH 31.3 09/29/2021    PLT 148 (L) 09/29/2021   MCHC 32.7 09/29/2021   RDW 17.4 (H) 09/29/2021   LYMPHSABS 0.6 (L) 09/27/2021   MONOABS 0.9 09/27/2021   EOSABS 0.1 09/27/2021   BASOSABS 0.0 50/38/8828     Last metabolic panel Lab Results  Component Value Date   NA 142 09/29/2021   K 4.8 09/29/2021   CL 102 09/29/2021   CO2 29 09/29/2021   BUN 35 (H) 09/29/2021   CREATININE 1.13 09/29/2021   GLUCOSE 116 (H) 09/29/2021   GFRNONAA >60 09/29/2021   GFRAA >60 09/18/2019   CALCIUM 8.8 (L) 09/29/2021   PROT 5.7 (L) 09/27/2021   ALBUMIN 3.3 (L) 09/27/2021   BILITOT 0.8 09/27/2021   ALKPHOS 120 09/27/2021   AST 28 09/27/2021   ALT 19 09/27/2021   ANIONGAP 11 09/29/2021    CBG (last 3)  Recent Labs    09/28/21 2047 09/29/21 0552 09/29/21 1045  GLUCAP 155* 118* 130*      Coagulation Profile: Recent Labs  Lab 09/28/21 0040 09/29/21 1103  INR 1.9* 1.8*     Radiology Studies: DG Chest 2 View  Result Date: 09/27/2021 CLINICAL DATA:  Shortness of breath. EXAM: CHEST - 2 VIEW COMPARISON:  Radiograph 09/18/2019 FINDINGS: Right-sided pacemaker remains in place. Similar cardiomegaly. TAVR. There are bilateral pleural effusions and bibasilar opacities, likely representing atelectasis, left greater than right. Mild vascular congestion. Subtle Kerley B-lines/pulmonary edema. No pneumothorax. No acute osseous findings. IMPRESSION: Mild to moderate CHF. Electronically Signed   By: Keith Rake M.D.   On: 09/27/2021 20:31       Hosie Poisson M.D. [facility] 09/29/2021, 3:23 PM  Available via Epic secure chat 7am-7pm After 7 pm, please refer to night coverage provider listed on amion.

## 2021-09-29 NOTE — Progress Notes (Signed)
Heart Failure Stewardship Pharmacist Progress Note   PCP: Kathyrn Lass, MD PCP-Cardiologist: Larae Grooms, MD    HPI:  81 yo M with PMH of afib, CHF, T2DM, HTN, OSA, non-hodgkin's lymphoma, severe MR and severe AS s/p TAVR.  He presented to the ED on 6/27 with dyspnea, orthopnea, and LEE. CXR with mild to moderate CHF. His last ECHO was done 09/22/21 and LVEF was 45% with severely reduced RV and severe MR. Structual team consulted before admission and decision was to watch, and if recurrent symptoms, he could be referred out for mitral valve repair/replacement.   Current HF Medications: Diuretic: furosemide 40 mg IV BID Beta Blocker: metoprolol XL 100 mg qhs  Prior to admission HF Medications: Beta blocker: metoprolol 100 mg XL qhs ACE/ARB/ARNI: lisinopril 10 mg qhs *also on Actos 15 mg daily, not taking lasix PRN  Pertinent Lab Values: Serum creatinine 1.13, BUN 35, Potassium 4.8, Sodium 142, BNP 354.5, Magnesium 2.0, A1c 6.3   Vital Signs: Weight: 258 lbs (admission weight: 258 lbs) Blood pressure: 100/50s  Heart rate: 60s - V-paced  I/O: -2.9L yesterday; net -3.8L  Medication Assistance / Insurance Benefits Check: Does the patient have prescription insurance?  Yes Type of insurance plan: [employer] Medicare  Does the patient qualify for medication assistance through manufacturers or grants?   Pending Eligible grants and/or patient assistance programs: pending Medication assistance applications in progress: none  Medication assistance applications approved: none Approved medication assistance renewals will be completed by: pending  Outpatient Pharmacy:  Prior to admission outpatient pharmacy: [facility] Is the patient willing to use [facility] pharmacy at discharge? Yes Is the patient willing to transition their outpatient pharmacy to utilize a [facility] outpatient pharmacy?   Pending    Assessment: 1. Acute on chronic systolic and diastolic CHF (LVEF 16%)  with RV dysfunction. NYHA class IV symptoms. - Severely volume overloaded on exam - continue furosemide 40 mg IV BID. Excellent UOP so far. Collect daily weights. Keep K>4 and Mag>2. - Continue metoprolol XL 100 mg qhs - Off lisinopril with soft BP - Consider starting SGLT2i prior to discharge for HF and T2DM and stop Actos - Consider starting digoxin 0.125 mg daily with severe RV dysfunction   Plan: 1) Medication changes recommended at this time: - Start digoxin 0.125 mg daily - Stop Actos on discharge  2) Patient assistance: - Farxiga/Jardiance copay $40  3)  Education  - To be completed prior to discharge  Kerby Nora, PharmD, BCPS Heart Failure Cytogeneticist Phone [phone]

## 2021-09-29 NOTE — Evaluation (Signed)
Physical Therapy Evaluation Patient Details Name: Gregory [name] MRN: [id number] DOB: 27-Sep-1940 Today's Date: 09/29/2021  History of Present Illness  Pt is an 81 y/o male admitted secondary to SOB and edema likely from CHF exacerbation. PMH includes a fib, DM, CHF, AS s/p TAVR, HTN, and non-hodgkin lymphoma.  Clinical Impression  Pt admitted secondary to problem above with deficits below. Pt requiring mod A to stand and take side steps at EOB. Increased weakness noted, especially in LLE and had difficulty advancing during gait. Pt currently lives alone and feel he is at increased risk for falls. Recommending SNF level therapies at d/c. Will continue to follow acutely.        Recommendations for follow up therapy are one component of a multi-disciplinary discharge planning process, led by the attending physician.  Recommendations may be updated based on patient status, additional functional criteria and insurance authorization.  Follow Up Recommendations Skilled nursing-short term rehab (<3 hours/day) Can patient physically be transported by private vehicle: No    Assistance Recommended at Discharge Frequent or constant Supervision/Assistance  Patient can return home with the following  A lot of help with walking and/or transfers;A lot of help with bathing/dressing/bathroom;Assistance with cooking/housework;Help with stairs or ramp for entrance;Assist for transportation    Equipment Recommendations Wheelchair (measurements PT);Wheelchair cushion (measurements PT)  Recommendations for Other Services       Functional Status Assessment Patient has had a recent decline in their functional status and demonstrates the ability to make significant improvements in function in a reasonable and predictable amount of time.     Precautions / Restrictions Precautions Precautions: Fall Restrictions Weight Bearing Restrictions: No      Mobility  Bed Mobility Overal bed mobility: Needs  Assistance Bed Mobility: Supine to Sit, Sit to Supine     Supine to sit: Min assist Sit to supine: Mod assist   General bed mobility comments: Heavy min A for trunk elevation. Mod A for LE assist back to bed    Transfers Overall transfer level: Needs assistance Equipment used: 1 person hand held assist Transfers: Sit to/from Stand Sit to Stand: Mod assist           General transfer comment: Mod A for lift assist and steadying to stand and take side steps at EOB. Pt with increased buckling in LLE and difficulty advancing when taking steps.    Ambulation/Gait                  Stairs            Wheelchair Mobility    Modified Rankin (Stroke Patients Only)       Balance Overall balance assessment: Needs assistance Sitting-balance support: No upper extremity supported Sitting balance-Leahy Scale: Fair     Standing balance support: Single extremity supported, During functional activity Standing balance-Leahy Scale: Poor Standing balance comment: Reliant on UE and external support                             Pertinent Vitals/Pain Pain Assessment Pain Assessment: No/denies pain    Home Living Family/patient expects to be discharged to:: Private residence Living Arrangements: Alone Available Help at Discharge: Neighbor;Available PRN/intermittently Type of Home: House Home Access: Stairs to enter   Entrance Stairs-Number of Steps: 2   Home Layout: One level Home Equipment: Conservation officer, nature (2 wheels);Cane - single point      Prior Function Prior Level of Function : Independent/Modified Independent  Mobility Comments: Has been using RW ADLs Comments: Has had increased difficulty with ADL tasks     Hand Dominance        Extremity/Trunk Assessment   Upper Extremity Assessment Upper Extremity Assessment: Defer to OT evaluation    Lower Extremity Assessment Lower Extremity Assessment: Generalized weakness  (increased swelling and redness bilaterally)    Cervical / Trunk Assessment Cervical / Trunk Assessment: Kyphotic  Communication   Communication: No difficulties  Cognition Arousal/Alertness: Awake/alert Behavior During Therapy: WFL for tasks assessed/performed Overall Cognitive Status: Within Functional Limits for tasks assessed                                          General Comments      Exercises     Assessment/Plan    PT Assessment Patient needs continued PT services  PT Problem List Decreased strength;Decreased range of motion;Decreased balance;Decreased activity tolerance;Decreased mobility;Decreased knowledge of use of DME;Decreased knowledge of precautions       PT Treatment Interventions DME instruction;Gait training;Stair training;Functional mobility training;Therapeutic activities;Balance training;Therapeutic exercise;Patient/family education    PT Goals (Current goals can be found in the Care Plan section)  Acute Rehab PT Goals Patient Stated Goal: to get stronger PT Goal Formulation: With patient Time For Goal Achievement: 10/13/21 Potential to Achieve Goals: Good    Frequency Min 2X/week     Co-evaluation               AM-PAC PT "6 Clicks" Mobility  Outcome Measure Help needed turning from your back to your side while in a flat bed without using bedrails?: A Little Help needed moving from lying on your back to sitting on the side of a flat bed without using bedrails?: A Lot Help needed moving to and from a bed to a chair (including a wheelchair)?: A Lot Help needed standing up from a chair using your arms (e.g., wheelchair or bedside chair)?: A Lot Help needed to walk in hospital room?: A Lot Help needed climbing 3-5 steps with a railing? : Total 6 Click Score: 12    End of Session Equipment Utilized During Treatment: Gait belt Activity Tolerance: Patient tolerated treatment well Patient left: in bed;with call bell/phone  within reach;with bed alarm set Nurse Communication: Mobility status PT Visit Diagnosis: Unsteadiness on feet (R26.81);Muscle weakness (generalized) (M62.81);Difficulty in walking, not elsewhere classified (R26.2)    Time: 1520-1540 PT Time Calculation (min) (ACUTE ONLY): 20 min   Charges:   PT Evaluation $PT Eval Moderate Complexity: 1 Mod          Reuel Derby, PT, DPT  Acute Rehabilitation Services  Office: [phone]   Rudean Hitt 09/29/2021, 4:43 PM

## 2021-09-30 DIAGNOSIS — Z952 Presence of prosthetic heart valve: Secondary | ICD-10-CM

## 2021-09-30 DIAGNOSIS — I5033 Acute on chronic diastolic (congestive) heart failure: Secondary | ICD-10-CM | POA: Diagnosis not present

## 2021-09-30 DIAGNOSIS — I340 Nonrheumatic mitral (valve) insufficiency: Secondary | ICD-10-CM

## 2021-09-30 DIAGNOSIS — I442 Atrioventricular block, complete: Secondary | ICD-10-CM

## 2021-09-30 DIAGNOSIS — E119 Type 2 diabetes mellitus without complications: Secondary | ICD-10-CM | POA: Diagnosis not present

## 2021-09-30 DIAGNOSIS — I482 Chronic atrial fibrillation, unspecified: Secondary | ICD-10-CM | POA: Diagnosis not present

## 2021-09-30 DIAGNOSIS — I50813 Acute on chronic right heart failure: Secondary | ICD-10-CM | POA: Diagnosis not present

## 2021-09-30 LAB — GLUCOSE, CAPILLARY
Glucose-Capillary: 150 mg/dL — ABNORMAL HIGH (ref 70–99)
Glucose-Capillary: 148 mg/dL — ABNORMAL HIGH (ref 70–99)
Glucose-Capillary: 105 mg/dL — ABNORMAL HIGH (ref 70–99)
Glucose-Capillary: 187 mg/dL — ABNORMAL HIGH (ref 70–99)

## 2021-09-30 LAB — CBC
HCT: 31.8 % — ABNORMAL LOW (ref 39.0–52.0)
Hemoglobin: 10.2 g/dL — ABNORMAL LOW (ref 13.0–17.0)
MCH: 30.6 pg (ref 26.0–34.0)
MCHC: 32.1 g/dL (ref 30.0–36.0)
MCV: 95.5 fL (ref 80.0–100.0)
Platelets: 155 10*3/uL (ref 150–400)
RBC: 3.33 MIL/uL — ABNORMAL LOW (ref 4.22–5.81)
RDW: 17.1 % — ABNORMAL HIGH (ref 11.5–15.5)
WBC: 5.9 10*3/uL (ref 4.0–10.5)
nRBC: 0 % (ref 0.0–0.2)

## 2021-09-30 LAB — BASIC METABOLIC PANEL
Anion gap: 9 (ref 5–15)
BUN: 33 mg/dL — ABNORMAL HIGH (ref 8–23)
CO2: 30 mmol/L (ref 22–32)
Calcium: 8.5 mg/dL — ABNORMAL LOW (ref 8.9–10.3)
Chloride: 101 mmol/L (ref 98–111)
Creatinine, Ser: 1.09 mg/dL (ref 0.61–1.24)
GFR, Estimated: >60 mL/min (ref >60)
Glucose, Bld: 120 mg/dL — ABNORMAL HIGH (ref 70–99)
Potassium: 4.6 mmol/L (ref 3.5–5.1)
Sodium: 140 mmol/L (ref 135–145)

## 2021-09-30 LAB — PROTIME-INR
INR: 2 — ABNORMAL HIGH (ref 0.8–1.2)
Prothrombin Time: 22 seconds — ABNORMAL HIGH (ref 11.4–15.2)

## 2021-09-30 MED ORDER — ENSURE ENLIVE PO LIQD
237.0000 mL | Freq: Two times a day (BID) | ORAL | Status: DC
  Administered 2021-09-30 – 2021-10-02 (×5): 237 mL via ORAL

## 2021-09-30 MED ORDER — WARFARIN SODIUM 2.5 MG PO TABS
2.5000 mg | ORAL_TABLET | Freq: Every day | ORAL | Status: DC
  Administered 2021-09-30 – 2021-10-01 (×2): 2.5 mg via ORAL
  Filled 2021-09-30 (×2): qty 1

## 2021-09-30 MED ORDER — SPIRONOLACTONE 12.5 MG HALF TABLET
12.5000 mg | ORAL_TABLET | Freq: Every day | ORAL | Status: DC
Start: 2021-09-30 — End: 2021-10-03
  Administered 2021-09-30 – 2021-10-03 (×4): 12.5 mg via ORAL
  Filled 2021-09-30 (×4): qty 1

## 2021-09-30 NOTE — NC FL2 (Signed)
[place] LEVEL OF CARE SCREENING TOOL     IDENTIFICATION  Patient Name: Gregory [name] Birthdate: Aug 21, 1940 Sex: male Admission Date (Current Location): 09/27/2021  [facility] and [place] Number:  Herbalist and Address:  [facility] [place], [place] [address], [place], [place] [address]      Provider Number: [id number]  Attending Physician Name and Address:  Hosie Poisson, MD  Relative Name and Phone Number:  Jamesetta Orleans (Niece)   [phone] [facility])    Current Level of Care: Hospital Recommended Level of Care: [place] Prior Approval Number:    Date Approved/Denied:   PASRR Number: [id number]  Discharge Plan: SNF    Current Diagnoses: Patient Active Problem List   Diagnosis Date Noted   Acute on chronic diastolic CHF (congestive heart failure) ([place]) 09/28/2021   Acute on chronic right heart failure ([place]) 09/28/2021   Acute on chronic diastolic heart failure ([place]) 09/16/2019   S/P TAVR (transcatheter aortic valve replacement) 09/16/2019   Mitral valve disease    Hypertension    Chronic atrial fibrillation ([place])    Diabetes mellitus type 2, noninsulin dependent ([place])    Severe aortic stenosis 05/25/2014   Mixed hyperlipidemia 08/26/2013   Obesity 08/26/2013   Essential hypertension, benign 08/26/2013   Non Hodgkin's lymphoma ([place]) 05/06/2012    Orientation RESPIRATION BLADDER Height & Weight     Self, Time, Situation, Place  Normal External catheter Weight: 253 lb 4.9 oz (114.9 kg) Height:  '5\' 10"'$  (177.8 cm)  BEHAVIORAL SYMPTOMS/MOOD NEUROLOGICAL BOWEL NUTRITION STATUS      Continent Diet (See d/c summary)  AMBULATORY STATUS COMMUNICATION OF NEEDS Skin   Extensive Assist Verbally Normal                       Personal Care Assistance Level of Assistance  Bathing, Feeding, Dressing Bathing Assistance: Limited assistance Feeding assistance: Independent Dressing Assistance: Limited assistance      Functional Limitations Info  Sight, Hearing, Speech Sight Info: Impaired Hearing Info: Adequate Speech Info: Adequate    SPECIAL CARE FACTORS FREQUENCY  OT (By licensed OT), PT (By licensed PT)     PT Frequency: 5x/week OT Frequency: 5x/week            Contractures Contractures Info: Not present    Additional Factors Info  Code Status, Allergies Code Status Info: DNR Allergies Info: Macrobid (nitrofurantoin, aspirin, nystatin, Prevnar 13 (pneumococcal 13-val Conj Vacc), nsaids, Bisoprolol-hydrochlorothiazide           Current Medications (09/30/2021):  This is the current hospital active medication list Current Facility-Administered Medications  Medication Dose Route Frequency Provider Last Rate Last Admin   acetaminophen (TYLENOL) tablet 650 mg  650 mg Oral Q6H PRN Opyd, Ilene Qua, MD       Or   acetaminophen (TYLENOL) suppository 650 mg  650 mg Rectal Q6H PRN Opyd, Ilene Qua, MD       atorvastatin (LIPITOR) tablet 20 mg  20 mg Oral Daily Opyd, Ilene Qua, MD   20 mg at 09/30/21 9798   digoxin (LANOXIN) tablet 0.125 mg  0.125 mg Oral Daily Freada Bergeron, MD   0.125 mg at 09/30/21 9211   furosemide (LASIX) injection 80 mg  80 mg Intravenous Q12H Margie Billet, PA-C   80 mg at 09/30/21 0551   insulin aspart (novoLOG) injection 0-5 Units  0-5 Units Subcutaneous QHS Opyd, Ilene Qua, MD       insulin aspart (  novoLOG) injection 0-6 Units  0-6 Units Subcutaneous TID WC Hosie Poisson, MD       metoprolol succinate (TOPROL-XL) 24 hr tablet 50 mg  50 mg Oral QHS Freada Bergeron, MD   50 mg at 09/29/21 2123   Oral care mouth rinse  15 mL Mouth Rinse PRN Hosie Poisson, MD       senna-docusate (Senokot-S) tablet 1 tablet  1 tablet Oral QHS PRN Opyd, Ilene Qua, MD       sodium chloride flush (NS) 0.9 % injection 3 mL  3 mL Intravenous Q12H Opyd, Ilene Qua, MD   3 mL at 09/30/21 0932   spironolactone (ALDACTONE) tablet 12.5 mg  12.5 mg Oral Daily Freada Bergeron,  MD       tamsulosin [facility]) capsule 0.4 mg  0.4 mg Oral Daily Opyd, Ilene Qua, MD   0.4 mg at 09/30/21 1155   warfarin (COUMADIN) tablet 2.5 mg  2.5 mg Oral q1600 Skeet Simmer, [facility]       Warfarin - Pharmacist Dosing Inpatient   Does not apply q1600 Laren Everts, [facility]   Given at 09/29/21 1756     Discharge Medications: Please see discharge summary for a list of discharge medications.  Relevant Imaging Results:  Relevant Lab Results:   Additional Information SSN Arcadia [place], [place]

## 2021-09-30 NOTE — Progress Notes (Signed)
Progress Note  Patient Name: Gregory [name] Date of Encounter: 09/30/2021  [facility] Cardiologist: Larae Grooms, MD   Subjective   Feels better. Denies chest pain. Breathing is okay. Remains grossly overloaded.  Inpatient Medications    Scheduled Meds:  atorvastatin  20 mg Oral Daily   digoxin  0.125 mg Oral Daily   furosemide  80 mg Intravenous Q12H   insulin aspart  0-5 Units Subcutaneous QHS   insulin aspart  0-6 Units Subcutaneous TID WC   metoprolol succinate  50 mg Oral QHS   sodium chloride flush  3 mL Intravenous Q12H   tamsulosin  0.4 mg Oral Daily   warfarin  2.5 mg Oral q1600   Warfarin - Pharmacist Dosing Inpatient   Does not apply q1600   Continuous Infusions:  PRN Meds: acetaminophen **OR** acetaminophen, mouth rinse, senna-docusate   Vital Signs    Vitals:   09/30/21 0403 09/30/21 0744 09/30/21 0900 09/30/21 0936  BP: 105/63 114/67  (!) 111/48  Pulse: 60 62  60  Resp: '19 16  20  '$ Temp: 97.6 F (36.4 C) (!) 97.5 F (36.4 C)    TempSrc: Oral Oral    SpO2: 91% 90%  96%  Weight: 114.9 kg     Height:   '5\' 10"'$  (1.778 m)     Intake/Output Summary (Last 24 hours) at 09/30/2021 1010 Last data filed at 09/30/2021 0939 Gross per 24 hour  Intake 537 ml  Output 4825 ml  Net -4288 ml      09/30/2021    4:03 AM 09/29/2021    3:20 AM 02/22/2021    1:57 PM  Last 3 Weights  Weight (lbs) 253 lb 4.9 oz 258 lb 6.1 oz 257 lb 3.2 oz  Weight (kg) 114.9 kg 117.2 kg 116.665 kg      Telemetry    V-paced at 60 - Personally Reviewed  ECG    No new ECG - Personally Reviewed  Physical Exam   GEN: Elderly male, sitting up in a chair   Neck: JVD to earlobe Cardiac: RRR, 2/6 systolic murmur, 1/6 diastolic murmur Respiratory: Rhonchorous GI: Soft, nontender, non-distended  MS: Gross anasarca, warm Neuro:  Nonfocal  Psych: Normal affect   Labs    High Sensitivity Troponin:   Recent Labs  Lab 09/27/21 1952 09/27/21 2209  TROPONINIHS 42* 33*      Chemistry Recent Labs  Lab 09/27/21 1952 09/28/21 1202 09/29/21 0642 09/30/21 0221  NA 138 138 142 140  K 5.2* 4.7 4.8 4.6  CL 105 104 102 101  CO2 '23 27 29 30  '$ GLUCOSE 119* 151* 116* 120*  BUN 39* 38* 35* 33*  CREATININE 1.09 1.11 1.13 1.09  CALCIUM 8.8* 8.7* 8.8* 8.5*  MG  --   --  2.0  --   PROT 5.7*  --   --   --   ALBUMIN 3.3*  --   --   --   AST 28  --   --   --   ALT 19  --   --   --   ALKPHOS 120  --   --   --   BILITOT 0.8  --   --   --   GFRNONAA >60 >60 >60 >60  ANIONGAP '10 7 11 9    '$ Lipids No results for input(s): "CHOL", "TRIG", "HDL", "LABVLDL", "LDLCALC", "CHOLHDL" in the last 168 hours.  Hematology Recent Labs  Lab 09/27/21 1952 09/29/21 0642 09/30/21 0221  WBC 7.4 6.2  5.9  RBC 3.63* 3.51* 3.33*  HGB 11.3* 11.0* 10.2*  HCT 35.6* 33.6* 31.8*  MCV 98.1 95.7 95.5  MCH 31.1 31.3 30.6  MCHC 31.7 32.7 32.1  RDW 17.4* 17.4* 17.1*  PLT 170 148* 155   Thyroid No results for input(s): "TSH", "FREET4" in the last 168 hours.  BNP Recent Labs  Lab 09/27/21 1952  BNP 354.5*    DDimer No results for input(s): "DDIMER" in the last 168 hours.   Radiology    No results found.  Cardiac Studies   Echocardiogram 09/22/21   1. Distal inferolateral and apical hypokinesis. . Left ventricular  ejection fraction, by estimation, is 45%%. The left ventricle has mildly  decreased function. The left ventricular internal cavity size was mildly  dilated.   2. Right ventricular systolic function is severely reduced. The right  ventricular size is moderately enlarged. There is moderately elevated  pulmonary artery systolic pressure.   3. Left atrial size was moderate to severely dilated.   4. Right atrial size was severely dilated.   5. Mitral valve is thickened with severe annular calcifications. Peak and  mean gradient through the valve are 24 and 7 mm Hg respectively.  Consistent with at least moderate to severe MS Mitral regurgitant jet is  directed posterior  into LA. Vena  contracta is 0.74; MR volume 55 ml OVerall consistent with severe MR. .  Severe mitral valve regurgitation. Severe mitral annular calcification.   6. S/P TAVR (29 mm Sapien prosthesis, implant date 09/16/19) Leaflets  appear to open well Peak and mean gradients through the valve are 17 and 9  mm Hg respectively AVA (VTI) is 1.8 cm2.      Mild perivalvular AI. No signficant change from echo in July 2022. The  aortic valve has been repaired/replaced. Aortic valve regurgitation is  mild. There is a 29 mm Sapien prosthetic (TAVR) valve present in the  aortic position. Procedure Date:  09/16/19.   7. Aortic dilatation noted. There is mild dilatation of the ascending  aorta, measuring 40 mm.   8. The inferior vena cava is dilated in size with <50% respiratory  variability, suggesting right atrial pressure of 15 mmHg.   Comparison(s): 10/20/20 EF 55-60%. Moderate MR. Moderate-severe MS 58mHg  mean PG. AV 635mg mean PG, 1071m peak PG.   Patient Profile     [age]. male with a hx of permanent atrial fibrillation on warfarin, chronic diastolic heart failure, severe AS s/p TAVR, history of complete heart block s/p PPM, severe mitral regurgitation, moderate-severe mitral stenosis, type 2 DM, non-hodgkin lymphoma, and HTN who presented to the ER with anasarca found to have acute on chronic diastolic heart failure and mainly right sided heart failure for which Cardiology was consulted.  Assessment & Plan    #Acute on Chronic Right Sided Heart Failure: #Acute on Chronic Diastolic Heart Failure: Patient presents with worsening volume overload and gross anasarca that has been progressively worsening over the past several months. Notably, has not been taking lasix at home as the pills were expired. Most recent TTE on 09/22/21 showed LVEF had dropped to 45% (from 55-60% in 10/2020) with moderate RV dilation with severe RV dysfunction, moderate-to-severe calcific MS, severe MR, moderate pulmonary  HTN, normal functioning TAVR valve with mild perivalvular AI, mildly dilated aorta 24m103mnd RAP 15mm58mRV appears worse on current TTE. Likely multifactorial in the setting of severe MR/moderate-to-severe MS with moderate pulmonary HTN, OSA, and possibly RV pacing. He is currently grossly overloaded on  exam with mainly right sided symptoms. Has been responding well to IV diuresis with wt 258lbs>253lbs overnight. -Continue lasix '80mg'$  IV BID and monitor response -Continue metop '50mg'$  XL daily -Continue digoxin 0.'125mg'$  daily -Start spironolactone 12.'5mg'$  daily -Add SGLT2i tomorrow  -Monitor I/Os and daily weights -Low Na diet  #Permanent Afib: Currently v-paced in the 60s. -Continue metop '50mg'$  XL daily -Continue warfarin for AC -Holding dilt for now given drop in EF and significant RV dysfunction  #Severe AS s/p TAVR: Normal prosthetic valve function on TTE as above. Mild perivalvular leak. Will continue to monitor. -Serial TTE monitoring  #Severe MR: #Moderate to severe MS: Noted on TTE 09/22/21. Likely will need to be managed medically as he is not an OR candidate or clip candidate. Will continue diuresis as above.  #CHB s/p [employer]tronic PPM Placement: V-paced on the monitor. Device functioning appropriately.        For questions or updates, please contact [place] Please consult [email or web address] for contact info under        Signed, Freada Bergeron, MD  09/30/2021, 10:10 AM

## 2021-09-30 NOTE — Progress Notes (Signed)
Heart Failure Stewardship Pharmacist Progress Note   PCP: Kathyrn Lass, MD PCP-Cardiologist: Larae Grooms, MD    HPI:  81 yo M with PMH of afib, CHF, T2DM, HTN, OSA, non-hodgkin's lymphoma, severe MR and severe AS s/p TAVR.  He presented to the ED on 6/27 with dyspnea, orthopnea, and LEE. CXR with mild to moderate CHF. His last ECHO was done 09/22/21 and LVEF was 45% with severely reduced RV and severe MR. Structual team consulted before admission and decision was to watch, and if recurrent symptoms, he could be referred out for mitral valve repair/replacement. Cardiology consulted - seeing if symptoms improve following compliance to lasix or if valvular intervention needs to take place. Not deemed to be a surgical candidate.  Current HF Medications: Diuretic: furosemide 80 mg IV BID Beta Blocker: metoprolol XL 50 mg qhs Other: digoxin 0.125 mg daily  Prior to admission HF Medications: Beta blocker: metoprolol 100 mg XL qhs ACE/ARB/ARNI: lisinopril 10 mg qhs *also on Actos 15 mg daily, not taking lasix PRN  Pertinent Lab Values: Serum creatinine 1.09, BUN 33, Potassium 4.6, Sodium 140, BNP 354.5, Magnesium 2.0, A1c 6.3, Digoxin level due early next week  Vital Signs: Weight: 253 lbs (admission weight: 258 lbs) Blood pressure: 110/60s  Heart rate: 60s - V-paced  I/O: -2.6L yesterday; net -7.3L  Medication Assistance / Insurance Benefits Check: Does the patient have prescription insurance?  Yes Type of insurance plan: [employer] Medicare  Does the patient qualify for medication assistance through manufacturers or grants?   Pending Eligible grants and/or patient assistance programs: pending Medication assistance applications in progress: none  Medication assistance applications approved: none Approved medication assistance renewals will be completed by: pending  Outpatient Pharmacy:  Prior to admission outpatient pharmacy: [facility] Is the patient willing to use [place] pharmacy at discharge? Yes Is the patient willing to transition their outpatient pharmacy to utilize a [facility] outpatient pharmacy?   Pending    Assessment: 1. Acute on chronic systolic and diastolic CHF (LVEF 13%) with RV dysfunction. NYHA class IV symptoms. - Severely volume overloaded on exam - continue furosemide 80 mg IV BID. Excellent UOP so far. Keep K>4 and Mag>2. - Continue metoprolol XL 50 mg qhs. Is V-paced to 60 bpm. - Off lisinopril with soft BP - Consider starting SGLT2i prior to discharge for HF and T2DM and stop Actos - Continue digoxin 0.125 mg daily with severe RV dysfunction - check level early next week (ordered for 7/3 with AM labs)   Plan: 1) Medication changes recommended at this time: - Continue current regimen - Stop Actos on discharge  2) Patient assistance: - Farxiga/Jardiance copay $40  3)  Education  - To be completed prior to discharge  Kerby Nora, PharmD, BCPS Heart Failure Stewardship Pharmacist Phone [phone]

## 2021-09-30 NOTE — Progress Notes (Signed)
ANTICOAGULATION CONSULT NOTE - Follow Up Consult  Pharmacy Consult for Warfarin Indication: atrial fibrillation  Allergies  Allergen Reactions   Aspirin     visual issues and fatigue   Macrobid [Nitrofurantoin]    Nystatin    Prevnar 13 [Pneumococcal 13-Val Conj Vacc]    Bisoprolol-Hydrochlorothiazide Other (See Comments)    "Severe" arthralgias and myalgias   Nsaids Other (See Comments)    GI upset, Peptic ulcer disease    Patient Measurements: Height: '5\' 10"'$  (177.8 cm) Weight: 114.9 kg (253 lb 4.9 oz) IBW/kg (Calculated) : 73  Vital Signs: Temp: 97.5 F (36.4 C) (06/30 0744) Temp Source: Oral (06/30 0744) BP: 114/67 (06/30 0744) Pulse Rate: 62 (06/30 0744)  Labs: Recent Labs    09/27/21 1952 09/27/21 2209 09/28/21 0040 09/28/21 1202 09/29/21 0642 09/29/21 1103 09/30/21 0221  HGB 11.3*  --   --   --  11.0*  --  10.2*  HCT 35.6*  --   --   --  33.6*  --  31.8*  PLT 170  --   --   --  148*  --  155  LABPROT  --   --  21.3*  --   --  20.6* 22.0*  INR  --   --  1.9*  --   --  1.8* 2.0*  CREATININE 1.09  --   --  1.11 1.13  --  1.09  TROPONINIHS 42* 45*  --   --   --   --   --     Estimated Creatinine Clearance: 67.5 mL/min (by C-G formula based on SCr of 1.09 mg/dL).   Assessment: 81yo male presented 09/27/21 pm c/o worsening edema of BUE/BLE with exertional dyspnea, admitted for acute on chronic HF. PTA Warfarin for atrial fibrillation continued.    PTA Warfarin regimen: 2.5 mg daily. Last PTA dose 6/26 at bedtime.   INR 1.9 on 6/28, just below target range and Warfarin 5 mg x 1 given.  INR 1.8 on 6/29. Patient preferred not to take high warfarin dose and usual 2.5 mg dose given. INR 2.0 today. Right at low therapeutic range.  No dose 6/27 pm.  Goal of Therapy:  INR 2-3 Monitor platelets by anticoagulation protocol: Yes   Plan:  Continue warfarin 2.5 mg daily. Daily PT/INR for now.  Arty Baumgartner, RPh 09/30/2021,9:25 AM

## 2021-09-30 NOTE — Progress Notes (Signed)
Initial Nutrition Assessment  DOCUMENTATION CODES:   Not applicable  INTERVENTION:   Adjust menu for chopped meats   Ensure Enlive po BID, each supplement provides 350 kcal and 20 grams of protein.    NUTRITION DIAGNOSIS:   Inadequate oral intake related to  (social situation) as evidenced by per patient/family report.  GOAL:   Patient will meet greater than or equal to 90% of their needs  MONITOR:   PO intake, Supplement acceptance  REASON FOR ASSESSMENT:   Consult Assessment of nutrition requirement/status  ASSESSMENT:   Pt with PMH of Afib on warfarin, CHF, severe AS s/p TAVR, severe MR, moderate-severe mitral stenosis, DM, non-hodgkin lymphoma, and HTN admitted with acute on chronic CHF.  Spoke with pt and his niece and his brother who were at bedside. Niece provides most of the hx and reports that pt lives alone and cannot cook much for himself. Per pt he can open and can and put it in a pot. Family brings him food. He obtained new dentures during covid which was delayed and do not fit well. Niece cutting up his meat, will change to chopped meats.  Since admission pt has been consuming 100% of his meals.   He reports that he stopped taking his lasix some time ago, niece feels that this was due to pt not being able to get to the bathroom easily. They had recently looked into ALF opportunities.  Per SW plan for [facility] on Monday.    Current weight reflects severe edema. Dry weight  Medications reviewed and include: lasix 80 mg BID, SSI, aldactone  Labs reviewed CBG's: 118-202  NUTRITION - FOCUSED PHYSICAL EXAM:  Flowsheet Row Most Recent Value  Orbital Region No depletion  Upper Arm Region No depletion  Thoracic and Lumbar Region No depletion  Buccal Region Moderate depletion  Temple Region Moderate depletion  Clavicle Bone Region No depletion  Clavicle and Acromion Bone Region No depletion  Scapular Bone Region No depletion  Dorsal Hand No depletion   Patellar Region Unable to assess  Anterior Thigh Region Unable to assess  Posterior Calf Region Unable to assess  Edema (RD Assessment) Severe  Hair Reviewed  Eyes Reviewed  Mouth Reviewed  [dentures not fitting well]  Skin Reviewed  Nails Reviewed       Diet Order:   Diet Order             Diet heart healthy/carb modified Room service appropriate? Yes; Fluid consistency: Thin  Diet effective now                   EDUCATION NEEDS:   Education needs have been addressed  Skin:  Skin Assessment: Reviewed RN Assessment  Last BM:  6/29  Height:   Ht Readings from Last 1 Encounters:  09/30/21 '5\' 10"'$  (1.778 m)    Weight:   Wt Readings from Last 1 Encounters:  09/30/21 114.9 kg    BMI:  Body mass index is 36.35 kg/m.  Estimated Nutritional Needs:   Kcal:  1900-2100  Protein:  95-115 grams  Fluid:  >1.9 L/day  Lockie Pares., RD, LDN, CNSC See AMiON for contact information

## 2021-09-30 NOTE — TOC Progression Note (Signed)
Transition of Care [facility]) - Progression Note    Patient Details  Name: Gregory [name] MRN: [id number] Date of Birth: 21-Jun-1940  Transition of Care [facility]) CM/SW [place], [place] Phone Number: 09/30/2021, 2:42 PM  Clinical Narrative:     Provided niece Threasa Beards with bed offers. She chooses [facility]. CSW confirmed with [facility] that they can accept pt on Monday. Threasa Beards is aware of fees for private room.   CSW called [employer] to start insurance auth request for SNF and PTAR. Auth currently pending.     Barriers to Discharge: Ship broker, Continued Medical Work up  [employer] and [employer]         Living arrangements for the past 2 months: Single Family Home                                       Social Determinants of Health (SDOH) Interventions Food Insecurity Interventions: Intervention Not Indicated Financial Strain Interventions: Intervention Not Indicated Housing Interventions: Intervention Not Indicated Transportation Interventions: Intervention Not Indicated  Readmission Risk Interventions    09/19/2019   12:12 PM  Readmission Risk Prevention Plan  Transportation Screening Complete  PCP or Specialist Appt within 5-7 Days Complete  Home Care Screening Complete  Medication Review (RN CM) Complete

## 2021-09-30 NOTE — TOC Progression Note (Signed)
Transition of Care [facility]) - Progression Note    Patient Details  Name: Gregory [name] MRN: [id number] Date of Birth: 1940-12-07  Transition of Care [facility]) CM/SW [place], [place] Phone Number: 09/30/2021, 5:00 PM  Clinical Narrative:     Josem Kaufmann approved for [facility]  SNF#: [id number] PTAR#: [id number]    Barriers to Discharge: Insurance Authorization, Continued Medical Work up  Expected Discharge Plan and Services         Living arrangements for the past 2 months: Single Family Home                                       Social Determinants of Health (SDOH) Interventions Food Insecurity Interventions: Intervention Not Indicated Financial Strain Interventions: Intervention Not Indicated Housing Interventions: Intervention Not Indicated Transportation Interventions: Intervention Not Indicated  Readmission Risk Interventions    09/19/2019   12:12 PM  Readmission Risk Prevention Plan  Transportation Screening Complete  PCP or Specialist Appt within 5-7 Days Complete  Home Care Screening Complete  Medication Review (RN CM) Complete

## 2021-09-30 NOTE — Progress Notes (Signed)
[facility]                                                                               Wirt Hemmerich, is a 81 y.o. male, DOB - [birth year], YIR:[id number] Admit date - 09/27/2021    Outpatient Primary MD for the patient is Kathyrn Lass, MD  LOS - 2  days    Brief summary  Gregory [name] is a pleasant 81 y.o. male with medical history significant for atrial fibrillation on warfarin, chronic diastolic CHF, chronic right-sided heart failure, severe AS status post TAVR, severe mitral regurgitation, moderate-severe mitral stenosis, type 2 diabetes mellitus, non-Hodgkin lymphoma, and hypertension, now presenting to the emergency department for evaluation of swelling and worsening exertional dyspnea. EKG features a ventricular paced rhythm with RBBB.  Mild to moderate CHF changes noted on chest x-ray.   Cardiology consulted.  Assessment & Plan    Acute on chronic systolic heart failure:  With severe MR, moderate- severe MS - Echo Showed LVEF 45% with severely reduced RV systolic function, severe MR, mod severe MS . BNP of 354, minimally elevated troponins probably from demand ischemia from CHF.  Cardiology consulted, appreciate recommendations, plan to continue with IV lasix 80 mg BID, continue with strict intake and output.  Resume metoprolol 50 mg XL daily along with digoxin. Spironolactone was added.  He has diuresed about  net 8.5 lit since admission He is on RA, with good sats.  Creatinine is at 1.09 with potassium of 4.6 .  Continue to monitor.    Severe MR:  Unfortunately not a surgical candidate.  Medical management    Severe AS S/p TAVR Outpatient follow up with cardiology.     Complete heart block s/p PPM Paced on the monitor.    Atrial fibrillation rate controlled.  Continue with metoprolol.  Continue with coumadin.  Therapeutic INR.     Type 2 DM with hyperglycemia:  CBG (last 3)  Recent Labs    09/29/21 2118 09/30/21 0623 09/30/21 1105   GLUCAP 202* 150* 148*   Hemoglobin A1c is 6.3 Resume SSI.    Stage 2 CKD and hyperkalemia:  Creatinine stable  Hyperkalemia resolved.    Anemia of chronic disease:  Hemoglobin stable between 11 to 10.  Continue to monitor.    Code Status: DNR DVT Prophylaxis:   warfarin (COUMADIN) tablet 2.5 mg   Level of Care: Level of care: Telemetry Cardiac Family Communication: Updated patients' family at bedside.   Disposition Plan:     Remains inpatient appropriate:  IV Lasix.   Procedures:  None.   Consultants:   Cardiology.   Antimicrobials:   Anti-infectives (From admission, onward)    None        Medications  Scheduled Meds:  atorvastatin  20 mg Oral Daily   digoxin  0.125 mg Oral Daily   feeding supplement  237 mL Oral BID BM   furosemide  80 mg Intravenous Q12H   insulin aspart  0-5 Units Subcutaneous QHS   insulin aspart  0-6 Units Subcutaneous TID WC   metoprolol succinate  50 mg Oral QHS   sodium chloride flush  3 mL Intravenous Q12H   spironolactone  12.5 mg Oral Daily   tamsulosin  0.4 mg Oral Daily   warfarin  2.5 mg Oral q1600   Warfarin - Pharmacist Dosing Inpatient   Does not apply q1600   Continuous Infusions: PRN Meds:.acetaminophen **OR** acetaminophen, mouth rinse, senna-docusate    Subjective:   Gregory [name] was seen and examined today. Pt reports feeling sob earlier this am. Much better.  Reports feeling weak and tired.  Objective:   Vitals:   09/30/21 0403 09/30/21 0744 09/30/21 0900 09/30/21 0936  BP: 105/63 114/67  (!) 111/48  Pulse: 60 62  60  Resp: '19 16  20  '$ Temp: 97.6 F (36.4 C) (!) 97.5 F (36.4 C)    TempSrc: Oral Oral    SpO2: 91% 90%  96%  Weight: 114.9 kg     Height:   '5\' 10"'$  (1.778 m)     Intake/Output Summary (Last 24 hours) at 09/30/2021 1612 Last data filed at 09/30/2021 1436 Gross per 24 hour  Intake 480 ml  Output 5225 ml  Net -4745 ml    Filed Weights   09/29/21 0320 09/30/21 0403  Weight: 117.2  kg 114.9 kg     Exam General exam: Appears calm and comfortable  Respiratory system: Clear to auscultation. Respiratory effort normal. Cardiovascular system: S1 & S2 heard, RRR. JVD with pedal edema.   Gastrointestinal system: Abdomen is nondistended, soft and nontender. Normal bowel sounds heard. Central nervous system: Alert and oriented. No focal neurological deficits. Extremities: Symmetric 5 x 5 power. Skin: No rashes, lesions or ulcers Psychiatry: Mood & affect appropriate.     Data Reviewed:  I have personally reviewed following labs and imaging studies   CBC Lab Results  Component Value Date   WBC 5.9 09/30/2021   RBC 3.33 (L) 09/30/2021   HGB 10.2 (L) 09/30/2021   HCT 31.8 (L) 09/30/2021   MCV 95.5 09/30/2021   MCH 30.6 09/30/2021   PLT 155 09/30/2021   MCHC 32.1 09/30/2021   RDW 17.1 (H) 09/30/2021   LYMPHSABS 0.6 (L) 09/27/2021   MONOABS 0.9 09/27/2021   EOSABS 0.1 09/27/2021   BASOSABS 0.0 79/05/4095     Last metabolic panel Lab Results  Component Value Date   NA 140 09/30/2021   K 4.6 09/30/2021   CL 101 09/30/2021   CO2 30 09/30/2021   BUN 33 (H) 09/30/2021   CREATININE 1.09 09/30/2021   GLUCOSE 120 (H) 09/30/2021   GFRNONAA >60 09/30/2021   GFRAA >60 09/18/2019   CALCIUM 8.5 (L) 09/30/2021   PROT 5.7 (L) 09/27/2021   ALBUMIN 3.3 (L) 09/27/2021   BILITOT 0.8 09/27/2021   ALKPHOS 120 09/27/2021   AST 28 09/27/2021   ALT 19 09/27/2021   ANIONGAP 9 09/30/2021    CBG (last 3)  Recent Labs    09/29/21 2118 09/30/21 0623 09/30/21 1105  GLUCAP 202* 150* 148*       Coagulation Profile: Recent Labs  Lab 09/28/21 0040 09/29/21 1103 09/30/21 0221  INR 1.9* 1.8* 2.0*      Radiology Studies: No results found.     Hosie Poisson M.D. [facility] 09/30/2021, 4:12 PM  Available via Epic secure chat 7am-7pm After 7 pm, please refer to night coverage provider listed on amion.

## 2021-09-30 NOTE — Care Management Important Message (Signed)
Important Message  Patient Details  Name: Gregory [name] MRN: [id number] Date of Birth: 1940-07-15   Medicare Important Message Given:  Yes     [name] 09/30/2021, 8:33 AM

## 2021-09-30 NOTE — Evaluation (Signed)
Occupational Therapy Evaluation Patient Details Name: Gregory [name] MRN: [id number] DOB: [birth year] Today's Date: 09/30/2021   History of Present Illness Pt is an 81 y/o male admitted secondary to SOB and edema likely from CHF exacerbation. PMH includes a fib, DM, CHF, AS s/p TAVR, HTN, and non-hodgkin lymphoma.   Clinical Impression   Patient admitted for the diagnosis above.  PTA he lives at home alone with PRN assist from family.  Patient generally uses a RW for mobility, and admits to increasing difficulty with ADL and iADL.  Currently he is needing up to Mod A for basic mobility and lower body ADL from a sit/stand level.  Unfortunately he does not have the needed 24 hour Mod A to transition directly home, so SNF level rehab is recommended.  Acute OT will follow in the acute setting.        Recommendations for follow up therapy are one component of a multi-disciplinary discharge planning process, led by the attending physician.  Recommendations may be updated based on patient status, additional functional criteria and insurance authorization.   Follow Up Recommendations  Skilled nursing-short term rehab (<3 hours/day)    Assistance Recommended at Discharge Frequent or constant Supervision/Assistance  Patient can return home with the following A lot of help with walking and/or transfers;A lot of help with bathing/dressing/bathroom;Help with stairs or ramp for entrance;Assist for transportation;Assistance with cooking/housework    Functional Status Assessment  Patient has had a recent decline in their functional status and demonstrates the ability to make significant improvements in function in a reasonable and predictable amount of time.  Equipment Recommendations  BSC/3in1    Recommendations for Other Services       Precautions / Restrictions Precautions Precautions: Fall Restrictions Weight Bearing Restrictions: No      Mobility Bed Mobility               General  bed mobility comments: sitting EOB    Transfers Overall transfer level: Needs assistance Equipment used: 1 person hand held assist Transfers: Sit to/from Stand, Bed to chair/wheelchair/BSC Sit to Stand: Mod assist     Step pivot transfers: Mod assist     General transfer comment: No RW in room      Balance Overall balance assessment: Needs assistance Sitting-balance support: No upper extremity supported Sitting balance-Leahy Scale: Fair     Standing balance support: Single extremity supported, During functional activity Standing balance-Leahy Scale: Poor                             ADL either performed or assessed with clinical judgement   ADL Overall ADL's : Needs assistance/impaired Eating/Feeding: Independent;Sitting   Grooming: Wash/dry hands;Wash/dry face;Set up;Sitting   Upper Body Bathing: Min guard;Sitting   Lower Body Bathing: Moderate assistance;Sit to/from stand   Upper Body Dressing : Min guard;Sitting   Lower Body Dressing: Moderate assistance;Sit to/from stand   Toilet Transfer: Minimal assistance;Moderate assistance;BSC/3in1;Stand-pivot   Toileting- Clothing Manipulation and Hygiene: Min guard;Sitting/lateral lean               Vision Patient Visual Report: No change from baseline       Perception Perception Perception: Not tested   Praxis Praxis Praxis: Not tested    Pertinent Vitals/Pain Pain Assessment Pain Assessment: Faces Faces Pain Scale: Hurts a little bit Pain Location: Bottom sore from sitting Pain Descriptors / Indicators: Aching Pain Intervention(s): Monitored during session     Hand Dominance Right  Extremity/Trunk Assessment Upper Extremity Assessment Upper Extremity Assessment: Generalized weakness   Lower Extremity Assessment Lower Extremity Assessment: Defer to PT evaluation   Cervical / Trunk Assessment Cervical / Trunk Assessment: Kyphotic   Communication Communication Communication: No  difficulties   Cognition Arousal/Alertness: Awake/alert Behavior During Therapy: WFL for tasks assessed/performed Overall Cognitive Status: Within Functional Limits for tasks assessed                                       General Comments   VSS on RA    Exercises     Shoulder Instructions      Home Living Family/patient expects to be discharged to:: Private residence Living Arrangements: Alone Available Help at Discharge: Neighbor;Available PRN/intermittently Type of Home: House Home Access: Stairs to enter [employer] of Steps: 2   Home Layout: One level     Bathroom Shower/Tub: Teacher, early years/pre: Standard Bathroom Accessibility: Yes How Accessible: Accessible via walker Home Equipment: [place] (2 wheels);Cane - single point          Prior Functioning/Environment Prior Level of Function : Independent/Modified Independent             Mobility Comments: Has been using RW ADLs Comments: Patient confirms increasing difficulty with lower body ADL and iADL.  Has two niece's that assist as needed/PRN.        OT Problem List: Decreased strength;Decreased activity tolerance;Impaired balance (sitting and/or standing);Increased edema;Pain      OT Treatment/Interventions: Self-care/ADL training;Therapeutic exercise;Therapeutic activities;Patient/family education;Balance training;DME and/or AE instruction    OT Goals(Current goals can be found in the care plan section) Acute Rehab OT Goals Patient Stated Goal: I need to get stronger before I go home. OT Goal Formulation: With patient Time For Goal Achievement: 10/14/21 Potential to Achieve Goals: Good ADL Goals Pt Will Perform Grooming: with set-up;standing Pt Will Perform Lower Body Dressing: with min assist;sit to/from stand;with adaptive equipment Pt Will Transfer to Toilet: with supervision;ambulating;regular height toilet Pt/caregiver will Perform Home Exercise  Program: Increased strength;Both right and left upper extremity;With theraband;With Supervision  OT Frequency: Min 2X/week    Co-evaluation              AM-PAC OT "6 Clicks" Daily Activity     Outcome Measure Help from another person eating meals?: None Help from another person taking care of personal grooming?: None Help from another person toileting, which includes using toliet, bedpan, or urinal?: A Lot Help from another person bathing (including washing, rinsing, drying)?: A Lot Help from another person to put on and taking off regular upper body clothing?: A Little Help from another person to put on and taking off regular lower body clothing?: A Lot 6 Click Score: 17   End of Session Nurse Communication: Mobility status  Activity Tolerance: Patient tolerated treatment well Patient left: in chair;with call bell/phone within reach;with chair alarm set  OT Visit Diagnosis: Unsteadiness on feet (R26.81);Muscle weakness (generalized) (M62.81)                Time: 2778-2423 OT Time Calculation (min): 21 min Charges:  OT General Charges $OT Visit: 1 Visit OT Evaluation $OT Eval Moderate Complexity: 1 Mod  09/30/2021  RP, OTR/L  Acute Rehabilitation Services  Office:  [phone]   Metta Clines 09/30/2021, 10:56 AM

## 2021-10-01 DIAGNOSIS — I5020 Unspecified systolic (congestive) heart failure: Secondary | ICD-10-CM | POA: Diagnosis not present

## 2021-10-01 DIAGNOSIS — I5033 Acute on chronic diastolic (congestive) heart failure: Secondary | ICD-10-CM | POA: Diagnosis not present

## 2021-10-01 DIAGNOSIS — I50813 Acute on chronic right heart failure: Secondary | ICD-10-CM | POA: Diagnosis not present

## 2021-10-01 DIAGNOSIS — E119 Type 2 diabetes mellitus without complications: Secondary | ICD-10-CM | POA: Diagnosis not present

## 2021-10-01 DIAGNOSIS — I482 Chronic atrial fibrillation, unspecified: Secondary | ICD-10-CM | POA: Diagnosis not present

## 2021-10-01 LAB — GLUCOSE, CAPILLARY
Glucose-Capillary: 96 mg/dL (ref 70–99)
Glucose-Capillary: 111 mg/dL — ABNORMAL HIGH (ref 70–99)
Glucose-Capillary: 129 mg/dL — ABNORMAL HIGH (ref 70–99)
Glucose-Capillary: 137 mg/dL — ABNORMAL HIGH (ref 70–99)

## 2021-10-01 LAB — CBC
HCT: 30.7 % — ABNORMAL LOW (ref 39.0–52.0)
Hemoglobin: 10.2 g/dL — ABNORMAL LOW (ref 13.0–17.0)
MCH: 31.2 pg (ref 26.0–34.0)
MCHC: 33.2 g/dL (ref 30.0–36.0)
MCV: 93.9 fL (ref 80.0–100.0)
Platelets: 152 10*3/uL (ref 150–400)
RBC: 3.27 MIL/uL — ABNORMAL LOW (ref 4.22–5.81)
RDW: 16.7 % — ABNORMAL HIGH (ref 11.5–15.5)
WBC: 6.1 10*3/uL (ref 4.0–10.5)
nRBC: 0 % (ref 0.0–0.2)

## 2021-10-01 LAB — BASIC METABOLIC PANEL
Anion gap: 8 (ref 5–15)
BUN: 31 mg/dL — ABNORMAL HIGH (ref 8–23)
CO2: 30 mmol/L (ref 22–32)
Calcium: 8.3 mg/dL — ABNORMAL LOW (ref 8.9–10.3)
Chloride: 98 mmol/L (ref 98–111)
Creatinine, Ser: 1.05 mg/dL (ref 0.61–1.24)
GFR, Estimated: >60 mL/min (ref >60)
Glucose, Bld: 109 mg/dL — ABNORMAL HIGH (ref 70–99)
Potassium: 4.3 mmol/L (ref 3.5–5.1)
Sodium: 136 mmol/L (ref 135–145)

## 2021-10-01 LAB — PROTIME-INR
INR: 1.9 — ABNORMAL HIGH (ref 0.8–1.2)
Prothrombin Time: 21.6 seconds — ABNORMAL HIGH (ref 11.4–15.2)

## 2021-10-01 MED ORDER — DAPAGLIFLOZIN PROPANEDIOL 10 MG PO TABS
10.0000 mg | ORAL_TABLET | Freq: Every day | ORAL | Status: DC
  Administered 2021-10-01 – 2021-10-04 (×4): 10 mg via ORAL
  Filled 2021-10-01 (×4): qty 1

## 2021-10-01 NOTE — Progress Notes (Signed)
Assumed care of patient at 2330; concurred with previous assessment. Patient denies pain and distress.

## 2021-10-01 NOTE — Progress Notes (Signed)
Progress Note  Patient Name: Gregory [name] Date of Encounter: 10/01/2021  Primary Cardiologist: Larae Grooms, MD  Subjective   Slept better last night.  Abdominal distention and leg swelling are gradually improving.  Inpatient Medications    Scheduled Meds:  atorvastatin  20 mg Oral Daily   digoxin  0.125 mg Oral Daily   feeding supplement  237 mL Oral BID BM   furosemide  80 mg Intravenous Q12H   insulin aspart  0-5 Units Subcutaneous QHS   insulin aspart  0-6 Units Subcutaneous TID WC   metoprolol succinate  50 mg Oral QHS   sodium chloride flush  3 mL Intravenous Q12H   spironolactone  12.5 mg Oral Daily   tamsulosin  0.4 mg Oral Daily   warfarin  2.5 mg Oral q1600   Warfarin - Pharmacist Dosing Inpatient   Does not apply q1600    PRN Meds: acetaminophen **OR** acetaminophen, mouth rinse, senna-docusate   Vital Signs    Vitals:   09/30/21 1702 09/30/21 1921 10/01/21 0331 10/01/21 0843  BP: 121/64 121/60 (!) 115/53 (!) 115/55  Pulse: 60 64 64 62  Resp: '20 19 17 18  '$ Temp: 97.7 F (36.5 C) 97.9 F (36.6 C) 97.9 F (36.6 C)   TempSrc: Oral Oral Oral   SpO2: 91% 93% 91% 90%  Weight:   111.3 kg   Height:        Intake/Output Summary (Last 24 hours) at 10/01/2021 0951 Last data filed at 10/01/2021 0823 Gross per 24 hour  Intake 951 ml  Output 5000 ml  Net -4049 ml   Filed Weights   09/29/21 0320 09/30/21 0403 10/01/21 0331  Weight: 117.2 kg 114.9 kg 111.3 kg    Telemetry    Ventricular pacing.  Personally reviewed.  ECG    An ECG dated 09/28/2021 was personally reviewed today and demonstrated:  Ventricular paced rhythm.  Physical Exam   GEN: No acute distress.   Neck: No JVD. Cardiac: RRR, 2/6 mid systolic murmur, no gallop.  Respiratory: Nonlabored. Clear to auscultation bilaterally. GI: Protuberant, bowel sounds present. MS: Chronic appearing woody edema. Neuro:  Nonfocal. Psych: Alert and oriented x 3. Normal affect.  Labs     Chemistry Recent Labs  Lab 09/27/21 1952 09/28/21 1202 09/29/21 0642 09/30/21 0221 10/01/21 0400  NA 138   < > 142 140 136  K 5.2*   < > 4.8 4.6 4.3  CL 105   < > 102 101 98  CO2 23   < > '29 30 30  '$ GLUCOSE 119*   < > 116* 120* 109*  BUN 39*   < > 35* 33* 31*  CREATININE 1.09   < > 1.13 1.09 1.05  CALCIUM 8.8*   < > 8.8* 8.5* 8.3*  PROT 5.7*  --   --   --   --   ALBUMIN 3.3*  --   --   --   --   AST 28  --   --   --   --   ALT 19  --   --   --   --   ALKPHOS 120  --   --   --   --   BILITOT 0.8  --   --   --   --   GFRNONAA >60   < > >60 >60 >60  ANIONGAP 10   < > '11 9 8   '$ < > = values in this interval not displayed.  Hematology Recent Labs  Lab 09/29/21 0642 09/30/21 0221 10/01/21 0400  WBC 6.2 5.9 6.1  RBC 3.51* 3.33* 3.27*  HGB 11.0* 10.2* 10.2*  HCT 33.6* 31.8* 30.7*  MCV 95.7 95.5 93.9  MCH 31.3 30.6 31.2  MCHC 32.7 32.1 33.2  RDW 17.4* 17.1* 16.7*  PLT 148* 155 152    Cardiac Enzymes Recent Labs  Lab 09/27/21 1952 09/27/21 2209  TROPONINIHS 42* 45*    BNP Recent Labs  Lab 09/27/21 1952  BNP 354.5*     Radiology    No results found.  Cardiac Studies   Echocardiogram 09/22/2021:  1. Distal inferolateral and apical hypokinesis. . Left ventricular  ejection fraction, by estimation, is 45%%. The left ventricle has mildly  decreased function. The left ventricular internal cavity size was mildly  dilated.   2. Right ventricular systolic function is severely reduced. The right  ventricular size is moderately enlarged. There is moderately elevated  pulmonary artery systolic pressure.   3. Left atrial size was moderate to severely dilated.   4. Right atrial size was severely dilated.   5. Mitral valve is thickened with severe annular calcifications. Peak and  mean gradient through the valve are 24 and 7 mm Hg respectively.  Consistent with at least moderate to severe MS Mitral regurgitant jet is  directed posterior into LA. Vena  contracta  is 0.74; MR volume 55 ml OVerall consistent with severe MR. .  Severe mitral valve regurgitation. Severe mitral annular calcification.   6. S/P TAVR (29 mm Sapien prosthesis, implant date 09/16/19) Leaflets  appear to open well Peak and mean gradients through the valve are 17 and 9  mm Hg respectively AVA (VTI) is 1.8 cm2.      Mild perivalvular AI. No signficant change from echo in July 2022. The  aortic valve has been repaired/replaced. Aortic valve regurgitation is  mild. There is a 29 mm Sapien prosthetic (TAVR) valve present in the  aortic position. Procedure Date:  09/16/19.   7. Aortic dilatation noted. There is mild dilatation of the ascending  aorta, measuring 40 mm.   8. The inferior vena cava is dilated in size with <50% respiratory  variability, suggesting right atrial pressure of 15 mmHg.   Assessment & Plan    1.  HFrEF with associated severe RV dysfunction and fluid overload/anasarca.  Approximately 3500 cc net output last 24 hours.  Remains on IV Lasix along with Toprol-XL, Aldactone, and Lanoxin.  Recent systolics 638-756.  2.  History of severe aortic stenosis status post TAVR.  Recent echocardiogram showed mild paravalvular regurgitation but grossly normal function with mean gradient 9 mmHg.  3.  Mitral valve disease, moderate to severe mitral stenosis and severe mitral regurgitation.  Conservative management anticipated as not a good operative candidate or candidate for MitraClip.  4.  Complete heart block with Medtronic pacemaker in place, ventricular pacing noted.  5.  Permanent atrial fibrillation, on Coumadin for anticoagulation.  CHA2DS2-VASc score is 5.  Chart reviewed.  Patient continues to diurese with clinical improvement.  Renal function remains stable with creatinine 1.05 and normal potassium.  Add Farxiga 10 mg daily.  Continue IV Lasix, Toprol-XL, digoxin, Aldactone, and Coumadin.  Hold off on ARB/ARNI given current blood pressure range.  Check BMET  AM.  Signed, Rozann Lesches, MD  10/01/2021, 9:51 AM

## 2021-10-01 NOTE — Progress Notes (Signed)
[facility]                                                                               [name], is a 81 y.o. male, DOB - May 01, 1940, VWU:[id number] Admit date - 09/27/2021    Outpatient Primary MD for the patient is Kathyrn Lass, MD  LOS - 3  days    Brief summary  Gregory [name] is a pleasant 81 y.o. male with medical history significant for atrial fibrillation on warfarin, chronic diastolic CHF, chronic right-sided heart failure, severe AS status post TAVR, severe mitral regurgitation, moderate-severe mitral stenosis, type 2 diabetes mellitus, non-Hodgkin lymphoma, and hypertension, now presenting to the emergency department for evaluation of swelling and worsening exertional dyspnea. EKG features a ventricular paced rhythm with RBBB.  Mild to moderate CHF changes noted on chest x-ray.   Cardiology consulted. He is diuresing well.   Assessment & Plan    Acute on chronic systolic heart failure:  With severe MR, moderate- severe MS - Echo Showed LVEF 45% with severely reduced RV systolic function, severe MR, mod severe MS . BNP of 354, minimally elevated troponins probably from demand ischemia from CHF.  Cardiology consulted, appreciate recommendations, plan to continue with IV lasix 80 mg BID, continue with strict intake and output.  Resume metoprolol 50 mg XL daily along with digoxin 0.125 mg daily. Marland Kitchen Spironolactone 12.5 mg  was added.  Farxiga added.  He has diuresed about  net 12.5 lit since admission He is on RA, with good sats.  Creatinine at baseline along with potassium .  Continue to monitor.    Severe MR:  Unfortunately not a surgical candidate.  Medical management    Severe AS S/p TAVR Outpatient follow up with cardiology.     Complete heart block s/p PPM Paced on the monitor.    Atrial fibrillation rate controlled.  Continue with metoprolol.  Continue with coumadin.  Therapeutic INR.     Type 2 DM with hyperglycemia:  CBG (last 3)   Recent Labs    09/30/21 2104 10/01/21 0619 10/01/21 1153  GLUCAP 187* 96 111*   Hemoglobin A1c is 6.3 no changes in meds.  Resume SSI.    Stage 2 CKD and hyperkalemia:  Creatinine stable  Hyperkalemia resolved.    Anemia of chronic disease:  Hemoglobin stable between 11 to 10.  Continue to monitor.    Code Status: DNR DVT Prophylaxis:   warfarin (COUMADIN) tablet 2.5 mg   Level of Care: Level of care: Telemetry Cardiac Family Communication: Updated patients' family at bedside.   Disposition Plan:     Remains inpatient appropriate:  IV Lasix.   Procedures:  None.   Consultants:   Cardiology.   Antimicrobials:   Anti-infectives (From admission, onward)    None        Medications  Scheduled Meds:  atorvastatin  20 mg Oral Daily   dapagliflozin propanediol  10 mg Oral Daily   digoxin  0.125 mg Oral Daily   feeding supplement  237 mL Oral BID BM   furosemide  80 mg Intravenous Q12H   insulin aspart  0-5 Units Subcutaneous QHS   insulin aspart  0-6 Units Subcutaneous TID WC   metoprolol succinate  50 mg Oral QHS   sodium chloride flush  3 mL Intravenous Q12H   spironolactone  12.5 mg Oral Daily   tamsulosin  0.4 mg Oral Daily   warfarin  2.5 mg Oral q1600   Warfarin - Pharmacist Dosing Inpatient   Does not apply q1600   Continuous Infusions: PRN Meds:.acetaminophen **OR** acetaminophen, mouth rinse, senna-docusate    Subjective:   [name] was seen and examined today. Reports feeling weak and tired.  Objective:   Vitals:   09/30/21 1921 10/01/21 0331 10/01/21 0843 10/01/21 1112  BP: 121/60 (!) 115/53 (!) 115/55 (!) 97/40  Pulse: 64 64 62 66  Resp: '19 17 18 18  '$ Temp: 97.9 F (36.6 C) 97.9 F (36.6 C)  98 F (36.7 C)  TempSrc: Oral Oral  Oral  SpO2: 93% 91% 90% 92%  Weight:  111.3 kg    Height:        Intake/Output Summary (Last 24 hours) at 10/01/2021 1433 Last data filed at 10/01/2021 1117 Gross per 24 hour  Intake 951 ml  Output  5600 ml  Net -4649 ml    Filed Weights   09/29/21 0320 09/30/21 0403 10/01/21 0331  Weight: 117.2 kg 114.9 kg 111.3 kg     Exam General exam: Appears calm and comfortable  Respiratory system: Clear to auscultation. Respiratory effort normal. Cardiovascular system: S1 & S2 heard, RRR, jVD plus, and with leg edema.  Gastrointestinal system: Abdomen is nondistended, soft and nontender. Normal bowel sounds heard. Central nervous system: Alert and oriented. No focal neurological deficits. Extremities: Symmetric 5 x 5 power. Skin: No rashes, lesions or ulcers Psychiatry: Mood & affect appropriate.      Data Reviewed:  I have personally reviewed following labs and imaging studies   CBC Lab Results  Component Value Date   WBC 6.1 10/01/2021   RBC 3.27 (L) 10/01/2021   HGB 10.2 (L) 10/01/2021   HCT 30.7 (L) 10/01/2021   MCV 93.9 10/01/2021   MCH 31.2 10/01/2021   PLT 152 10/01/2021   MCHC 33.2 10/01/2021   RDW 16.7 (H) 10/01/2021   LYMPHSABS 0.6 (L) 09/27/2021   MONOABS 0.9 09/27/2021   EOSABS 0.1 09/27/2021   BASOSABS 0.0 98/92/1194     Last metabolic panel Lab Results  Component Value Date   NA 136 10/01/2021   K 4.3 10/01/2021   CL 98 10/01/2021   CO2 30 10/01/2021   BUN 31 (H) 10/01/2021   CREATININE 1.05 10/01/2021   GLUCOSE 109 (H) 10/01/2021   GFRNONAA >60 10/01/2021   GFRAA >60 09/18/2019   CALCIUM 8.3 (L) 10/01/2021   PROT 5.7 (L) 09/27/2021   ALBUMIN 3.3 (L) 09/27/2021   BILITOT 0.8 09/27/2021   ALKPHOS 120 09/27/2021   AST 28 09/27/2021   ALT 19 09/27/2021   ANIONGAP 8 10/01/2021    CBG (last 3)  Recent Labs    09/30/21 2104 10/01/21 0619 10/01/21 1153  GLUCAP 187* 96 111*       Coagulation Profile: Recent Labs  Lab 09/28/21 0040 09/29/21 1103 09/30/21 0221 10/01/21 0400  INR 1.9* 1.8* 2.0* 1.9*      Radiology Studies: No results found.     Hosie Poisson M.D. [facility] 10/01/2021, 2:33 PM  Available via Epic  secure chat 7am-7pm After 7 pm, please refer to night coverage provider listed on amion.

## 2021-10-01 NOTE — Progress Notes (Signed)
ANTICOAGULATION CONSULT NOTE - Follow Up Consult  Pharmacy Consult for Warfarin Indication: atrial fibrillation  Allergies  Allergen Reactions   Aspirin     visual issues and fatigue   Macrobid [Nitrofurantoin]    Nystatin    Prevnar 13 [Pneumococcal 13-Val Conj Vacc]    Bisoprolol-Hydrochlorothiazide Other (See Comments)    "Severe" arthralgias and myalgias   Nsaids Other (See Comments)    GI upset, Peptic ulcer disease    Patient Measurements: Height: '5\' 10"'$  (177.8 cm) Weight: 111.3 kg (245 lb 6 oz) IBW/kg (Calculated) : 73  Vital Signs: Temp: 98 F (36.7 C) (07/01 1112) Temp Source: Oral (07/01 1112) BP: 97/40 (07/01 1112) Pulse Rate: 66 (07/01 1112)  Labs: Recent Labs    09/29/21 0642 09/29/21 1103 09/30/21 0221 10/01/21 0400  HGB 11.0*  --  10.2* 10.2*  HCT 33.6*  --  31.8* 30.7*  PLT 148*  --  155 152  LABPROT  --  20.6* 22.0* 21.6*  INR  --  1.8* 2.0* 1.9*  CREATININE 1.13  --  1.09 1.05     Estimated Creatinine Clearance: 68.9 mL/min (by C-G formula based on SCr of 1.05 mg/dL).   Assessment: 81yo male presented 09/27/21 pm c/o worsening edema of BUE/BLE with exertional dyspnea, admitted for acute on chronic HF. PTA Warfarin for atrial fibrillation continued.    PTA Warfarin regimen: 2.5 mg daily. Last PTA dose 6/26 at bedtime.   INR fluctuating1.8-2 around lower end of goal range on PTA dose warfarin 2.'5mg'$  daily.  Tried to boost dose earlier in stay and patient refused. Cbc stable Afib CVR V paced    Goal of Therapy:  INR 2-3 Monitor platelets by anticoagulation protocol: Yes   Plan:  Continue warfarin 2.5 mg daily. Daily PT/INR for now.  Bonnita Nasuti Pharm.D. CPP, BCPS Clinical Pharmacist [phone] 10/01/2021 2:20 PM

## 2021-10-02 DIAGNOSIS — E119 Type 2 diabetes mellitus without complications: Secondary | ICD-10-CM | POA: Diagnosis not present

## 2021-10-02 DIAGNOSIS — I5033 Acute on chronic diastolic (congestive) heart failure: Secondary | ICD-10-CM | POA: Diagnosis not present

## 2021-10-02 DIAGNOSIS — I482 Chronic atrial fibrillation, unspecified: Secondary | ICD-10-CM | POA: Diagnosis not present

## 2021-10-02 DIAGNOSIS — Z7189 Other specified counseling: Secondary | ICD-10-CM

## 2021-10-02 DIAGNOSIS — I50813 Acute on chronic right heart failure: Secondary | ICD-10-CM | POA: Diagnosis not present

## 2021-10-02 DIAGNOSIS — Z515 Encounter for palliative care: Secondary | ICD-10-CM

## 2021-10-02 DIAGNOSIS — I5020 Unspecified systolic (congestive) heart failure: Secondary | ICD-10-CM | POA: Diagnosis not present

## 2021-10-02 LAB — URINALYSIS, ROUTINE W REFLEX MICROSCOPIC
Bilirubin Urine: NEGATIVE
Glucose, UA: >=500 mg/dL — AB
Ketones, ur: NEGATIVE mg/dL
Nitrite: POSITIVE — AB
Protein, ur: 30 mg/dL — AB
Specific Gravity, Urine: 1.009 (ref 1.005–1.030)
WBC, UA: >50 WBC/hpf — ABNORMAL HIGH (ref 0–5)
pH: 9 — ABNORMAL HIGH (ref 5.0–8.0)

## 2021-10-02 LAB — CBC
HCT: 32.6 % — ABNORMAL LOW (ref 39.0–52.0)
Hemoglobin: 10.8 g/dL — ABNORMAL LOW (ref 13.0–17.0)
MCH: 30.9 pg (ref 26.0–34.0)
MCHC: 33.1 g/dL (ref 30.0–36.0)
MCV: 93.4 fL (ref 80.0–100.0)
Platelets: 174 10*3/uL (ref 150–400)
RBC: 3.49 MIL/uL — ABNORMAL LOW (ref 4.22–5.81)
RDW: 16.4 % — ABNORMAL HIGH (ref 11.5–15.5)
WBC: 8.8 10*3/uL (ref 4.0–10.5)
nRBC: 0 % (ref 0.0–0.2)

## 2021-10-02 LAB — GLUCOSE, CAPILLARY
Glucose-Capillary: 115 mg/dL — ABNORMAL HIGH (ref 70–99)
Glucose-Capillary: 128 mg/dL — ABNORMAL HIGH (ref 70–99)
Glucose-Capillary: 115 mg/dL — ABNORMAL HIGH (ref 70–99)
Glucose-Capillary: 151 mg/dL — ABNORMAL HIGH (ref 70–99)

## 2021-10-02 LAB — BASIC METABOLIC PANEL
Anion gap: 9 (ref 5–15)
BUN: 28 mg/dL — ABNORMAL HIGH (ref 8–23)
CO2: 33 mmol/L — ABNORMAL HIGH (ref 22–32)
Calcium: 8.6 mg/dL — ABNORMAL LOW (ref 8.9–10.3)
Chloride: 96 mmol/L — ABNORMAL LOW (ref 98–111)
Creatinine, Ser: 1.02 mg/dL (ref 0.61–1.24)
GFR, Estimated: >60 mL/min (ref >60)
Glucose, Bld: 108 mg/dL — ABNORMAL HIGH (ref 70–99)
Potassium: 3.9 mmol/L (ref 3.5–5.1)
Sodium: 138 mmol/L (ref 135–145)

## 2021-10-02 LAB — PROTIME-INR
INR: 1.6 — ABNORMAL HIGH (ref 0.8–1.2)
Prothrombin Time: 18.4 seconds — ABNORMAL HIGH (ref 11.4–15.2)

## 2021-10-02 MED ORDER — WARFARIN SODIUM 2.5 MG PO TABS
2.5000 mg | ORAL_TABLET | Freq: Every day | ORAL | Status: DC
  Administered 2021-10-03 – 2021-10-04 (×2): 2.5 mg via ORAL
  Filled 2021-10-02 (×2): qty 1

## 2021-10-02 MED ORDER — PHENAZOPYRIDINE HCL 100 MG PO TABS
100.0000 mg | ORAL_TABLET | Freq: Three times a day (TID) | ORAL | Status: DC
  Administered 2021-10-03 – 2021-10-04 (×6): 100 mg via ORAL
  Filled 2021-10-02 (×7): qty 1

## 2021-10-02 MED ORDER — SODIUM CHLORIDE 0.9 % IV SOLN
1.0000 g | INTRAVENOUS | Status: DC
  Administered 2021-10-02 – 2021-10-04 (×3): 1 g via INTRAVENOUS
  Filled 2021-10-02 (×3): qty 10

## 2021-10-02 MED ORDER — WARFARIN SODIUM 5 MG PO TABS
5.0000 mg | ORAL_TABLET | Freq: Once | ORAL | Status: AC
  Administered 2021-10-02: 5 mg via ORAL
  Filled 2021-10-02: qty 1

## 2021-10-02 NOTE — Progress Notes (Signed)
ANTICOAGULATION CONSULT NOTE - Follow Up Consult  Pharmacy Consult for Warfarin Indication: atrial fibrillation  Allergies  Allergen Reactions   Aspirin     visual issues and fatigue   Macrobid [Nitrofurantoin]    Nystatin    Prevnar 13 [Pneumococcal 13-Val Conj Vacc]    Bisoprolol-Hydrochlorothiazide Other (See Comments)    "Severe" arthralgias and myalgias   Nsaids Other (See Comments)    GI upset, Peptic ulcer disease    Patient Measurements: Height: '5\' 10"'$  (177.8 cm) Weight: 107.2 kg (236 lb 5.3 oz) IBW/kg (Calculated) : 73  Vital Signs: Temp: 97.8 F (36.6 C) (07/02 0922) Temp Source: Oral (07/02 0922) BP: 116/55 (07/02 0922) Pulse Rate: 66 (07/02 0922)  Labs: Recent Labs    09/30/21 0221 10/01/21 0400 10/02/21 0418  HGB 10.2* 10.2* 10.8*  HCT 31.8* 30.7* 32.6*  PLT 155 152 174  LABPROT 22.0* 21.6* 18.4*  INR 2.0* 1.9* 1.6*  CREATININE 1.09 1.05 1.02     Estimated Creatinine Clearance: 69.7 mL/min (by C-G formula based on SCr of 1.02 mg/dL).   Assessment: 81yo male presented 09/27/21 pm c/o worsening edema of BUE/BLE with exertional dyspnea, admitted for acute on chronic HF. PTA Warfarin for atrial fibrillation continued.    PTA Warfarin regimen: 2.5 mg daily. Last PTA dose 6/26 at bedtime.   INR fell to 1.6 <  goal range on PTA dose warfarin 2.'5mg'$  daily.  Tried to boost dose earlier in stay and patient refused. Discussed with patient today and he is ok wil higher today today  Cbc stable Afib CVR V paced    Goal of Therapy:  INR 2-3 Monitor platelets by anticoagulation protocol: Yes   Plan:  Warfarin '5mg'$  x1 today then  Continue warfarin 2.5 mg daily. Daily PT/INR for now.  Bonnita Nasuti Pharm.D. CPP, BCPS Clinical Pharmacist [phone] 10/02/2021 1:48 PM

## 2021-10-02 NOTE — Progress Notes (Signed)
Orthopedic Tech Progress Note Patient Details:  [name] Jul 31, 1940 [id number]  Patient ID: Gregory [name], male   DOB: 07-Mar-1941, 81 y.o.   MRN: [id number] Patient currently eating and has visitors, he requested that I come back later to apply the unna boots, will check back in a few hours. Vernona Rieger 10/02/2021, 12:41 PM

## 2021-10-02 NOTE — Progress Notes (Signed)
Orthopedic Tech Progress Note Patient Details:  Gregory [name] 08/19/1940 [id number]  Ortho Devices Type of Ortho Device: Haematologist Ortho Device/Splint Location: BLE Ortho Device/Splint Interventions: Ordered, Application, Adjustment   Post Interventions Patient Tolerated: Well Instructions Provided: Care of device  [name] 10/02/2021, 4:04 PM

## 2021-10-02 NOTE — Progress Notes (Signed)
[facility]                                                                               Gregory [name], is a 81 y.o. male, DOB - July 08, 1940, UKG:[id number] Admit date - 09/27/2021    Outpatient Primary MD for the patient is Gregory Lass, MD  LOS - 4  days    Brief summary  Gregory [name] is a pleasant 81 y.o. male with medical history significant for atrial fibrillation on warfarin, chronic diastolic CHF, chronic right-sided heart failure, severe AS status post TAVR, severe mitral regurgitation, moderate-severe mitral stenosis, type 2 diabetes mellitus, non-Hodgkin lymphoma, and hypertension, now presenting to the emergency department for evaluation of swelling and worsening exertional dyspnea. EKG features a ventricular paced rhythm with RBBB.  Mild to moderate CHF changes noted on chest x-ray.   Cardiology consulted. He is diuresing well.   Assessment & Plan    Acute on chronic systolic heart failure:  With severe MR, moderate- severe MS - Echo Showed LVEF 45% with severely reduced RV systolic function, severe MR, mod severe MS . BNP of 354, minimally elevated troponins probably from demand ischemia from CHF.  Cardiology consulted, appreciate recommendations, plan to continue with IV lasix 80 mg BID, continue with strict intake and output.  Resume metoprolol 50 mg XL daily along with digoxin 0.125 mg daily. Marland Kitchen Spironolactone 12.5 mg  was added.  Farxiga added.  He has diuresed about 15.6 net  lit since admission He is on RA, with good sats.  Creatinine at baseline along with potassium .  Continue to monitor.    Severe MR:  Unfortunately not a surgical candidate.  Medical management    Severe AS S/p TAVR Outpatient follow up with cardiology.     Complete heart block s/p PPM Paced on the monitor.    Atrial fibrillation rate controlled.  Continue with metoprolol.  Continue with coumadin.  Sub therapeutic INR.     Type 2 DM with hyperglycemia:  CBG (last  3)  Recent Labs    10/01/21 2151 10/02/21 0611 10/02/21 1156  GLUCAP 137* 115* 128*   Hemoglobin A1c is 6.3 no changes in meds.  Resume SSI.    Stage 2 CKD and hyperkalemia:  Creatinine stable  Hyperkalemia resolved.    Anemia of chronic disease:  Hemoglobin stable between 11 to 10.  Continue to monitor.   Urinary Frequency with abnormal UA.  He was found to have large leukocytes and positive nitrites with pyuria.  Urine cultures obtained  Started him on rocephin.    Code Status: DNR DVT Prophylaxis:   warfarin (COUMADIN) tablet 5 mg  warfarin (COUMADIN) tablet 2.5 mg   Level of Care: Level of care: Telemetry Cardiac Family Communication: Updated patients' family at bedside.   Disposition Plan:     Remains inpatient appropriate:  IV Lasix.   Procedures:  None.   Consultants:   Cardiology.   Antimicrobials:   Anti-infectives (From admission, onward)    Start     Dose/Rate Route Frequency Ordered Stop   10/02/21 1035  cefTRIAXone (ROCEPHIN) 1 g in sodium chloride 0.9 % 100 mL IVPB  1 g 200 mL/hr over 30 Minutes Intravenous Every 24 hours 10/02/21 1035          Medications  Scheduled Meds:  atorvastatin  20 mg Oral Daily   dapagliflozin propanediol  10 mg Oral Daily   digoxin  0.125 mg Oral Daily   feeding supplement  237 mL Oral BID BM   furosemide  80 mg Intravenous Q12H   insulin aspart  0-5 Units Subcutaneous QHS   insulin aspart  0-6 Units Subcutaneous TID WC   metoprolol succinate  50 mg Oral QHS   sodium chloride flush  3 mL Intravenous Q12H   spironolactone  12.5 mg Oral Daily   tamsulosin  0.4 mg Oral Daily   [START ON 10/03/2021] warfarin  2.5 mg Oral q1600   warfarin  5 mg Oral ONCE-1600   Warfarin - Pharmacist Dosing Inpatient   Does not apply q1600   Continuous Infusions:  cefTRIAXone (ROCEPHIN)  IV     PRN Meds:.acetaminophen **OR** acetaminophen, mouth rinse, senna-docusate    Subjective:   Gregory [name] was seen and  examined today. Unable to get out of chair, no sob. Reports feeling weak and tired.  Objective:   Vitals:   10/01/21 1112 10/01/21 1924 10/02/21 0355 10/02/21 0922  BP: (!) 97/40 (!) 106/50 (!) 128/50 (!) 116/55  Pulse: 66 61 66 66  Resp: '18 19 18 18  '$ Temp: 98 F (36.7 C) 98.5 F (36.9 C) 98.1 F (36.7 C) 97.8 F (36.6 C)  TempSrc: Oral Oral Oral Oral  SpO2: 92% 96% 92% 93%  Weight:   107.2 kg   Height:        Intake/Output Summary (Last 24 hours) at 10/02/2021 1514 Last data filed at 10/02/2021 1412 Gross per 24 hour  Intake 720 ml  Output 3900 ml  Net -3180 ml    Filed Weights   09/30/21 0403 10/01/21 0331 10/02/21 0355  Weight: 114.9 kg 111.3 kg 107.2 kg     Exam General exam: Appears calm and comfortable  Respiratory system: Clear to auscultation. Respiratory effort normal. Cardiovascular system: S1 & S2 heard, RRR. JVD, pedal edema present.  Gastrointestinal system: Abdomen is nondistended, soft and nontender. Normal bowel sounds heard. Central nervous system: Alert and oriented. No focal neurological deficits. Extremities: Symmetric 5 x 5 power. Skin: No rashes, lesions or ulcers Psychiatry: mood is appropriate.       Data Reviewed:  I have personally reviewed following labs and imaging studies   CBC Lab Results  Component Value Date   WBC 8.8 10/02/2021   RBC 3.49 (L) 10/02/2021   HGB 10.8 (L) 10/02/2021   HCT 32.6 (L) 10/02/2021   MCV 93.4 10/02/2021   MCH 30.9 10/02/2021   PLT 174 10/02/2021   MCHC 33.1 10/02/2021   RDW 16.4 (H) 10/02/2021   LYMPHSABS 0.6 (L) 09/27/2021   MONOABS 0.9 09/27/2021   EOSABS 0.1 09/27/2021   BASOSABS 0.0 58/12/9831     Last metabolic panel Lab Results  Component Value Date   NA 138 10/02/2021   K 3.9 10/02/2021   CL 96 (L) 10/02/2021   CO2 33 (H) 10/02/2021   BUN 28 (H) 10/02/2021   CREATININE 1.02 10/02/2021   GLUCOSE 108 (H) 10/02/2021   GFRNONAA >60 10/02/2021   GFRAA >60 09/18/2019   CALCIUM 8.6 (L)  10/02/2021   PROT 5.7 (L) 09/27/2021   ALBUMIN 3.3 (L) 09/27/2021   BILITOT 0.8 09/27/2021   ALKPHOS 120 09/27/2021   AST 28 09/27/2021  ALT 19 09/27/2021   ANIONGAP 9 10/02/2021    CBG (last 3)  Recent Labs    10/01/21 2151 10/02/21 0611 10/02/21 1156  GLUCAP 137* 115* 128*       Coagulation Profile: Recent Labs  Lab 09/28/21 0040 09/29/21 1103 09/30/21 0221 10/01/21 0400 10/02/21 0418  INR 1.9* 1.8* 2.0* 1.9* 1.6*      Radiology Studies: No results found.     Hosie Poisson M.D. [facility] 10/02/2021, 3:14 PM  Available via Epic secure chat 7am-7pm After 7 pm, please refer to night coverage provider listed on amion.

## 2021-10-02 NOTE — Progress Notes (Signed)
Progress Note  Patient Name: Gregory [name] Date of Encounter: 10/02/2021  Primary Cardiologist: Larae Grooms, MD  Subjective   Some abdominal discomfort this morning.  Had bowel movement.  Still urinating well.  Inpatient Medications    Scheduled Meds:  atorvastatin  20 mg Oral Daily   dapagliflozin propanediol  10 mg Oral Daily   digoxin  0.125 mg Oral Daily   feeding supplement  237 mL Oral BID BM   furosemide  80 mg Intravenous Q12H   insulin aspart  0-5 Units Subcutaneous QHS   insulin aspart  0-6 Units Subcutaneous TID WC   metoprolol succinate  50 mg Oral QHS   sodium chloride flush  3 mL Intravenous Q12H   spironolactone  12.5 mg Oral Daily   tamsulosin  0.4 mg Oral Daily   warfarin  2.5 mg Oral q1600   Warfarin - Pharmacist Dosing Inpatient   Does not apply q1600    PRN Meds: acetaminophen **OR** acetaminophen, mouth rinse, senna-docusate   Vital Signs    Vitals:   10/01/21 0843 10/01/21 1112 10/01/21 1924 10/02/21 0355  BP: (!) 115/55 (!) 97/40 (!) 106/50 (!) 128/50  Pulse: 62 66 61 66  Resp: '18 18 19 18  '$ Temp:  98 F (36.7 C) 98.5 F (36.9 C) 98.1 F (36.7 C)  TempSrc:  Oral Oral Oral  SpO2: 90% 92% 96% 92%  Weight:    107.2 kg  Height:        Intake/Output Summary (Last 24 hours) at 10/02/2021 0805 Last data filed at 10/02/2021 0356 Gross per 24 hour  Intake 720 ml  Output 4250 ml  Net -3530 ml   Filed Weights   09/30/21 0403 10/01/21 0331 10/02/21 0355  Weight: 114.9 kg 111.3 kg 107.2 kg    Telemetry    Ventricular pacing with intermittent intrinsic conduction and atrial fibrillation.  Personally reviewed.  ECG    An ECG dated 09/28/2021 was personally reviewed today and demonstrated:  Ventricular paced rhythm.  Physical Exam   GEN: No acute distress.   Neck: No JVD. Cardiac: RRR, 2/6 midsystolic murmur, no gallop.  Respiratory: Nonlabored. Clear to auscultation bilaterally. GI: Protuberant, bowel sounds present. MS: Improving  woody edema. Neuro:  Nonfocal. Psych: Alert and oriented x 3. Normal affect.  Labs    Chemistry Recent Labs  Lab 09/27/21 1952 09/28/21 1202 09/30/21 0221 10/01/21 0400 10/02/21 0418  NA 138   < > 140 136 138  K 5.2*   < > 4.6 4.3 3.9  CL 105   < > 101 98 96*  CO2 23   < > 30 30 33*  GLUCOSE 119*   < > 120* 109* 108*  BUN 39*   < > 33* 31* 28*  CREATININE 1.09   < > 1.09 1.05 1.02  CALCIUM 8.8*   < > 8.5* 8.3* 8.6*  PROT 5.7*  --   --   --   --   ALBUMIN 3.3*  --   --   --   --   AST 28  --   --   --   --   ALT 19  --   --   --   --   ALKPHOS 120  --   --   --   --   BILITOT 0.8  --   --   --   --   GFRNONAA >60   < > >60 >60 >60  ANIONGAP 10   < > 9  8 9   < > = values in this interval not displayed.     Hematology Recent Labs  Lab 09/30/21 0221 10/01/21 0400 10/02/21 0418  WBC 5.9 6.1 8.8  RBC 3.33* 3.27* 3.49*  HGB 10.2* 10.2* 10.8*  HCT 31.8* 30.7* 32.6*  MCV 95.5 93.9 93.4  MCH 30.6 31.2 30.9  MCHC 32.1 33.2 33.1  RDW 17.1* 16.7* 16.4*  PLT 155 152 174    Cardiac Enzymes Recent Labs  Lab 09/27/21 1952 09/27/21 2209  TROPONINIHS 42* 45*    BNP Recent Labs  Lab 09/27/21 1952  BNP 354.5*     Radiology    No results found.  Cardiac Studies   Echocardiogram 09/22/2021:  1. Distal inferolateral and apical hypokinesis. . Left ventricular  ejection fraction, by estimation, is 45%%. The left ventricle has mildly  decreased function. The left ventricular internal cavity size was mildly  dilated.   2. Right ventricular systolic function is severely reduced. The right  ventricular size is moderately enlarged. There is moderately elevated  pulmonary artery systolic pressure.   3. Left atrial size was moderate to severely dilated.   4. Right atrial size was severely dilated.   5. Mitral valve is thickened with severe annular calcifications. Peak and  mean gradient through the valve are 24 and 7 mm Hg respectively.  Consistent with at least  moderate to severe MS Mitral regurgitant jet is  directed posterior into LA. Vena  contracta is 0.74; MR volume 55 ml OVerall consistent with severe MR. .  Severe mitral valve regurgitation. Severe mitral annular calcification.   6. S/P TAVR (29 mm Sapien prosthesis, implant date 09/16/19) Leaflets  appear to open well Peak and mean gradients through the valve are 17 and 9  mm Hg respectively AVA (VTI) is 1.8 cm2.      Mild perivalvular AI. No signficant change from echo in July 2022. The  aortic valve has been repaired/replaced. Aortic valve regurgitation is  mild. There is a 29 mm Sapien prosthetic (TAVR) valve present in the  aortic position. Procedure Date:  09/16/19.   7. Aortic dilatation noted. There is mild dilatation of the ascending  aorta, measuring 40 mm.   8. The inferior vena cava is dilated in size with <50% respiratory  variability, suggesting right atrial pressure of 15 mmHg.   Assessment & Plan    1.  HFrEF with associated severe RV dysfunction and fluid overload/anasarca.  Continues with approximately 3500 cc net output again last 24 hours.  Remains on IV Lasix along with Toprol-XL, Aldactone, Farxiga and Lanoxin.  Renal function holding steady with creatinine 1.02 and normal potassium.  2.  History of severe aortic stenosis status post TAVR.  Recent echocardiogram showed mild paravalvular regurgitation but grossly normal function with mean gradient 9 mmHg.  3.  Mitral valve disease, moderate to severe mitral stenosis and severe mitral regurgitation.  Conservative management anticipated as not a good operative candidate or candidate for MitraClip.  4.  Complete heart block with Medtronic pacemaker in place, ventricular pacing noted.  5.  Permanent atrial fibrillation, on Coumadin for anticoagulation.  CHA2DS2-VASc score is 5.  Patient continues to diurese with clinical improvement.  Renal function remains stable with creatinine 1.02 and normal potassium.  Continue  present IV Lasix dosing.  Otherwise tolerating Toprol-XL, Aldactone, Lanoxin and [place].  Coumadin per pharmacy.  Signed, Rozann Lesches, MD  10/02/2021, 8:05 AM

## 2021-10-02 NOTE — Consult Note (Signed)
Palliative Medicine Inpatient Consult Note  Consulting Provider: Hosie Poisson, MD  Reason for consult:   Gregory [name] Palliative Medicine Consult  Reason for Consult? goals of care   10/02/2021  HPI:  Per intake H&P --> Gregory [name] is a pleasant 81 y.o. male with medical history significant for atrial fibrillation on warfarin, chronic diastolic CHF, chronic right-sided heart failure, severe AS status post TAVR, severe mitral regurgitation, moderate-severe mitral stenosis, type 2 diabetes mellitus, non-Hodgkin lymphoma, and hypertension, now presenting to the emergency department for evaluation of swelling and worsening exertional dyspnea.  Palliative care has been asked to get involved to further discuss goals of care in the setting of multiple chronic comorbid conditions and an acute heart failure exacerbation.  Clinical Assessment/Goals of Care:  *Please note that this is a verbal dictation therefore any spelling or grammatical errors are due to the "[name]" system interpretation.  I have reviewed medical records including EPIC notes, labs and imaging, received report from bedside RN, assessed the patient.    I met with Gregory [name] to further discuss diagnosis prognosis, GOC, EOL wishes, disposition and options.   I introduced Palliative Medicine as specialized medical care for people living with serious illness. It focuses on providing relief from the symptoms and stress of a serious illness. The goal is to improve quality of life for both the patient and the family.  Medical History Review and Understanding:  A review of Gregory [name]'s medical conditions inclusive of atrial fibrillation, diastolic heart failure, aortic stenosis status post TAVR, mitral valve stenosis, type 2 diabetes, non-Hodgkin's lymphoma, chronic debility was had.  Social History:  Gregory [name] lives in [place].  He has never been married and has no children.  He has a  Sister Gregory [name] who is 12 years younger than him and a brother Gregory [name] who is 13 months apart from him.  His care is overseen by his nieces Gregory [name] and Gregory [name].  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  He is a man of faith and practices within Christianity.  He shares that he loves gardening, being outside, and watching sports.  Functional and Nutritional State:  Prior to hospital admission [name] had been living alone in a single-family home.  His function is very limited and for the most part his day consists of him sitting in a recliner chair-he slides out of this and turns around on the floor to get up.  He does have a crutch which he uses in the home to mobilize as well as a walker.  He has a poor appetite overall, though what he eats is all of the wrong foods for his disease processes.  Advance Directives:  A detailed discussion was had today regarding advanced directives.Patient's nieces Gregory [name] and Gregory [name] are  surrogate decision makers.  A copy of these has been retrieved and will be sent medical records.  Code Status:  Gregory [name] is an established DNAR/DNI CODE STATUS.  Discussion:  Gregory [name] and I had a long discussion about his past medical history as above.  We additionally reviewed his multiple hospitalizations he has had over the years.  Gregory [name] I reviewed the chronic disease trajectory of heart failure in accordance with the [name] classification system.  We discussed the circumstances leading to Gregory [name]'s hospitalization.  He shares that he was having a harder time moving and getting about.  This is not new in the setting of his right knee which  he has terrible osteoporosis and.  He endorses that he was swollen up significantly and when his [name] came out to assess him she shared that he had to go to the hospital in the setting of his florid volume overload.  Gregory [name] expresses thankfulness for her efforts in having come  to the hospital.  He is feeling tremendously better since he has been diuresed and is noted a significant amount of swelling decrease.  We further reviewed that Gregory [name] is likely getting to a point where he cannot live at home alone anymore.  He is very aware of this fact.  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  We discussed that him transitioning there is likely of benefit for both skilled nursing and then assistance with living thereafter.  Per patient's nieces he has a long-term life insurance policy which would help with the payment associated with [facility].  In regard to symptoms the only thing Gregory [name] expresses at this time is burning with urination.  I shared that I would bring this to the attention of Gregory [name] and brought up that he had a positive urine analysis.  We discussed medication such as Pyridium which do help with urethral burning though patients have to have stable kidney function to initiate that.  He is open to this as symptomatic relief.  Gregory [name] shares the hope for continued improvement in terms of diuresis.  Discussed the importance of continued conversation with family and their  medical providers regarding overall plan of care and treatment options, ensuring decisions are within the context of the patients values and GOCs.  Decision Maker: Gregory [name] (Niece): [phone] (Mobile)  SUMMARY OF RECOMMENDATIONS   DNAR/DNI  Advance Directives will be scanned into Vyn[employer]  Discussed patient's cardiovascular disease severity  Reviewed that patient is likely unsafe to live at home alone any longer  Outpatient palliative support will be provided upon discharge  Chaplain support as patient is a strong Christian  The inpatient palliative care team will continue to follow Gregory [name] until he is discharged  Code Status/Advance Care Planning: DNAR/DNI  Palliative  Prophylaxis:  Aspiration, Bowel Regimen, Delirium Protocol, Frequent Pain Assessment, Oral Care, Palliative Wound Care, and Turn Reposition  Additional Recommendations (Limitations, Scope, Preferences): Continue current scope of care  Psycho-social/Spiritual:  Desire for further Chaplaincy support: Yes-Christian Additional Recommendations: Education on chronicity of heart failure   Prognosis: Multiple chronic comorbid conditions, increased frailty.  At a high 30-monthmortality risk  Discharge Planning: Discharge will need to be to skilled nursing with outpatient palliative support.  Vitals:   10/02/21 0355 10/02/21 0922  BP: (!) 128/50 (!) 116/55  Pulse: 66 66  Resp: 18 18  Temp: 98.1 F (36.7 C) 97.8 F (36.6 C)  SpO2: 92% 93%    Intake/Output Summary (Last 24 hours) at 10/02/2021 1532 Last data filed at 10/02/2021 1412 Gross per 24 hour  Intake 720 ml  Output 3900 ml  Net -3180 ml   Last Weight  Most recent update: 10/02/2021  4:01 AM    Weight  107.2 kg (236 lb 5.3 oz)            Gen: Elderly Caucasian male in no acute distress HEENT: moist mucous membranes CV: Regular rate and rhythm PULM: c on room air breathing is even and nonlabored ABD: soft/nontender EXT generalized edema Neuro: Alert and oriented x3  PPS: 50%  This conversation/these recommendations were discussed with patient primary care team, Gregory [name]  Total Time: 78  Billing based on MDM: High  Problems Addressed: One acute or chronic illness or injury that poses a threat to life or bodily function  Amount and/or Complexity of Data: Category 3:Discussion of management or test interpretation with external physician/other qualified health care professional/appropriate source (not separately reported)  Risks: Decision not to resuscitate or to de-escalate care because of poor prognosis ______________________________________________________ [place]  Team Team Cell Phone: [phone] Please utilize secure chat with additional questions, if there is no response within 30 minutes please call the above phone number  Palliative Medicine Team providers are available by phone from 7am to 7pm daily and can be reached through the team cell phone.  Should this patient require assistance outside of these hours, please call the patient's attending physician.

## 2021-10-03 DIAGNOSIS — Z515 Encounter for palliative care: Secondary | ICD-10-CM | POA: Diagnosis not present

## 2021-10-03 DIAGNOSIS — Z7189 Other specified counseling: Secondary | ICD-10-CM | POA: Diagnosis not present

## 2021-10-03 DIAGNOSIS — I5033 Acute on chronic diastolic (congestive) heart failure: Secondary | ICD-10-CM | POA: Diagnosis not present

## 2021-10-03 LAB — BASIC METABOLIC PANEL
Anion gap: 8 (ref 5–15)
BUN: 26 mg/dL — ABNORMAL HIGH (ref 8–23)
CO2: 34 mmol/L — ABNORMAL HIGH (ref 22–32)
Calcium: 8.5 mg/dL — ABNORMAL LOW (ref 8.9–10.3)
Chloride: 94 mmol/L — ABNORMAL LOW (ref 98–111)
Creatinine, Ser: 1.12 mg/dL (ref 0.61–1.24)
GFR, Estimated: >60 mL/min (ref >60)
Glucose, Bld: 98 mg/dL (ref 70–99)
Potassium: 3.4 mmol/L — ABNORMAL LOW (ref 3.5–5.1)
Sodium: 136 mmol/L (ref 135–145)

## 2021-10-03 LAB — CBC
HCT: 32 % — ABNORMAL LOW (ref 39.0–52.0)
Hemoglobin: 10.5 g/dL — ABNORMAL LOW (ref 13.0–17.0)
MCH: 30.5 pg (ref 26.0–34.0)
MCHC: 32.8 g/dL (ref 30.0–36.0)
MCV: 93 fL (ref 80.0–100.0)
Platelets: 158 10*3/uL (ref 150–400)
RBC: 3.44 MIL/uL — ABNORMAL LOW (ref 4.22–5.81)
RDW: 16.2 % — ABNORMAL HIGH (ref 11.5–15.5)
WBC: 11.2 10*3/uL — ABNORMAL HIGH (ref 4.0–10.5)
nRBC: 0 % (ref 0.0–0.2)

## 2021-10-03 LAB — GLUCOSE, CAPILLARY
Glucose-Capillary: 110 mg/dL — ABNORMAL HIGH (ref 70–99)
Glucose-Capillary: 104 mg/dL — ABNORMAL HIGH (ref 70–99)
Glucose-Capillary: 102 mg/dL — ABNORMAL HIGH (ref 70–99)
Glucose-Capillary: 127 mg/dL — ABNORMAL HIGH (ref 70–99)

## 2021-10-03 LAB — PROTIME-INR
INR: 1.7 — ABNORMAL HIGH (ref 0.8–1.2)
Prothrombin Time: 19.7 seconds — ABNORMAL HIGH (ref 11.4–15.2)

## 2021-10-03 LAB — DIGOXIN LEVEL: Digoxin Level: 0.4 ng/mL — ABNORMAL LOW (ref 0.8–2.0)

## 2021-10-03 MED ORDER — SPIRONOLACTONE 25 MG PO TABS
25.0000 mg | ORAL_TABLET | Freq: Every day | ORAL | Status: DC
  Administered 2021-10-04: 25 mg via ORAL
  Filled 2021-10-03: qty 1

## 2021-10-03 NOTE — Progress Notes (Addendum)
Physical Therapy Treatment Patient Details Name: Gregory [name] MRN: [id number] DOB: [birth year] Today's Date: 10/03/2021   History of Present Illness Pt is an 81 y/o male admitted secondary to SOB and edema likely from CHF exacerbation. PMH includes a fib, DM, CHF, AS s/p TAVR, HTN, and non-hodgkin lymphoma.    PT Comments    Pt making steady progress with mobility. However pt lives alone and continues to need assist with all mobility. Continue to recommend skilled nursing facility for post-acute therapy needs.    Recommendations for follow up therapy are one component of a multi-disciplinary discharge planning process, led by the attending physician.  Recommendations may be updated based on patient status, additional functional criteria and insurance authorization.  Follow Up Recommendations  Skilled nursing-short term rehab (<3 hours/day)     Assistance Recommended at Discharge Frequent or constant Supervision/Assistance  Patient can return home with the following Assistance with cooking/housework;Help with stairs or ramp for entrance;Assist for transportation;A little help with walking and/or transfers;A little help with bathing/dressing/bathroom   Equipment Recommendations  None recommended by PT    Recommendations for Other Services       Precautions / Restrictions Precautions Precautions: Fall     Mobility  Bed Mobility               General bed mobility comments: sitting EOB    Transfers Overall transfer level: Needs assistance Equipment used: Rolling walker (2 wheels) Transfers: Sit to/from Stand, Bed to chair/wheelchair/BSC Sit to Stand: Mod assist, Min assist   Step pivot transfers: Min assist       General transfer comment: Mod assist to stand from low soft bed to power up. Min assist to stand from [facility]    Ambulation/Gait Ambulation/Gait assistance: Min assist Gait Distance (Feet): 200 Feet Assistive device: Rolling walker (2 wheels) Gait  Pattern/deviations: Step-through pattern, Decreased stride length, Trunk flexed Gait velocity: decr Gait velocity interpretation: <1.31 ft/sec, indicative of household ambulator   General Gait Details: Assist for balance and support   Stairs             Wheelchair Mobility    Modified Rankin (Stroke Patients Only)       Balance Overall balance assessment: Needs assistance Sitting-balance support: No upper extremity supported, Feet supported Sitting balance-Leahy Scale: Fair     Standing balance support: Bilateral upper extremity supported, Reliant on assistive device for balance Standing balance-Leahy Scale: Poor Standing balance comment: walker and min guard for static standing                            Cognition Arousal/Alertness: Awake/alert Behavior During Therapy: WFL for tasks assessed/performed Overall Cognitive Status: Within Functional Limits for tasks assessed                                          Exercises      General Comments        Pertinent Vitals/Pain Pain Assessment Pain Assessment: No/denies pain    Home Living                          Prior Function            PT Goals (current goals can now be found in the care plan section) Acute Rehab PT Goals Patient Stated Goal: to get  stronger Progress towards PT goals: Progressing toward goals    Frequency    Min 2X/week      PT Plan Current plan remains appropriate    Co-evaluation              AM-PAC PT "6 Clicks" Mobility   Outcome Measure  Help needed turning from your back to your side while in a flat bed without using bedrails?: A Little Help needed moving from lying on your back to sitting on the side of a flat bed without using bedrails?: A Little Help needed moving to and from a bed to a chair (including a wheelchair)?: A Little Help needed standing up from a chair using your arms (e.g., wheelchair or bedside chair)?: A  Little Help needed to walk in hospital room?: A Little Help needed climbing 3-5 steps with a railing? : Total 6 Click Score: 16    End of Session Equipment Utilized During Treatment: Gait belt Activity Tolerance: Patient tolerated treatment well Patient left: in chair;with call bell/phone within reach;with chair alarm set Nurse Communication: Other (comment) (pt wants soft diet due to missing teeth) PT Visit Diagnosis: Unsteadiness on feet (R26.81);Muscle weakness (generalized) (M62.81);Difficulty in walking, not elsewhere classified (R26.2)     Time: 4742-5956 PT Time Calculation (min) (ACUTE ONLY): 21 min  Charges:  $Gait Training: 8-22 mins                     [place] [facility] [place] 10/03/2021, 3:02 PM

## 2021-10-03 NOTE — Progress Notes (Signed)
   Palliative Medicine Inpatient Follow Up Note  HPI: Gregory [name] is a pleasant 81 y.o. male with medical history significant for atrial fibrillation on warfarin, chronic diastolic CHF, chronic right-sided heart failure, severe AS status post TAVR, severe mitral regurgitation, moderate-severe mitral stenosis, type 2 diabetes mellitus, non-Hodgkin lymphoma, and hypertension, now presenting to the emergency department for evaluation of swelling and worsening exertional dyspnea.  Palliative care has been asked to get involved to further discuss goals of care in the setting of multiple chronic comorbid conditions and an acute heart failure exacerbation.  Today's Discussion 10/03/2021  *Please note that this is a verbal dictation therefore any spelling or grammatical errors are due to the "[place] [employer]" system interpretation.  Chart reviewed inclusive of vital signs, progress notes, laboratory results, and diagnostic images.   I met with Gregory [name] at bedside this morning.  He was in good spirits and denied any pain, nausea, or shortness of breath this morning.  He was noted to be on room air.   Gregory [name] is very well aware at this juncture of the severity of his heart failure and the need for more stringent management and oversight. He feels very limited in the setting of his right knee OA.   Created space and opportunity for patient to explore thoughts feelings and fears regarding current medical situation. Gregory [name] is aware that he needs more help than he could provide himself at home. He expresses a degree of peace with the plan for placement.   Questions and concerns addressed   Palliative Support Provided  Objective Assessment: Vital Signs Vitals:   10/02/21 2038 10/03/21 0608  BP: (!) 117/57 118/66  Pulse: 66 66  Resp: 20 17  Temp: 98.2 F (36.8 C) 99.4 F (37.4 C)  SpO2: 94% 96%    Intake/Output Summary (Last 24 hours) at 10/03/2021 1043 Last data filed at 10/03/2021 0600 Gross per  24 hour  Intake 480 ml  Output 2900 ml  Net -2420 ml   Last Weight  Most recent update: 10/02/2021  4:01 AM    Weight  107.2 kg (236 lb 5.3 oz)            Gen: Elderly Caucasian male in no acute distress HEENT: moist mucous membranes CV: Regular rate and rhythm PULM: On room air breathing is even and nonlabored ABD: soft/nontender EXT generalized edema Neuro: Alert and oriented x3  SUMMARY OF RECOMMENDATIONS   DNAR/DNI   Advance Directives will be scanned into V[employer]   Discussed patient's cardiovascular disease severity   Reviewed that patient is likely unsafe to live at home alone any longer --> He is at peace with transitioning to [place] palliative support will be provided upon discharge   Chaplain support as patient is a strong Christian   The inpatient palliative care team will continue to follow Gregory [name] until he is discharged  Billing based on MDM: Moderate  ______________________________________________________________________________________ [place] Team Team Cell Phone: [phone] Please utilize secure chat with additional questions, if there is no response within 30 minutes please call the above phone number  Palliative Medicine Team providers are available by phone from 7am to 7pm daily and can be reached through the team cell phone.  Should this patient require assistance outside of these hours, please call the patient's attending physician.

## 2021-10-03 NOTE — Progress Notes (Signed)
Heart Failure Stewardship Pharmacist Progress Note   PCP: Kathyrn Lass, MD PCP-Cardiologist: Larae Grooms, MD    HPI:  81 yo M with PMH of afib, CHF, T2DM, HTN, OSA, non-hodgkin's lymphoma, severe MR and severe AS s/p TAVR.  He presented to the ED on 6/27 with dyspnea, orthopnea, and LEE. CXR with mild to moderate CHF. His last ECHO was done 09/22/21 and LVEF was 45% with severely reduced RV and severe MR. Structual team consulted before admission and decision was to watch, and if recurrent symptoms, he could be referred out for mitral valve repair/replacement. Cardiology consulted - seeing if symptoms improve following compliance to lasix or if valvular intervention needs to take place. Not deemed to be a surgical candidate. Now DNR with ongoing goals of care discussions. SNF at discharge.  Current HF Medications: Diuretic: furosemide 80 mg IV BID Beta Blocker: metoprolol XL 50 mg qhs Aldosterone Antagonist: spironolactone 25 mg daily SGLT2i: Farxiga 10 mg daily Other: digoxin 0.125 mg daily  Prior to admission HF Medications: Beta blocker: metoprolol 100 mg XL qhs ACE/ARB/ARNI: lisinopril 10 mg qhs *also on Actos 15 mg daily, not taking lasix PRN  Pertinent Lab Values: Serum creatinine 1.12, BUN 26, Potassium 3.4, Sodium 136, BNP 354.5, Magnesium 2.0, A1c 6.3, Digoxin level 0.4 (7/3)  Vital Signs: Weight: 236 lbs (admission weight: 258 lbs) Blood pressure: 110/60s  Heart rate: 60s - V-paced  I/O: -2.2L yesterday; net -17.3L  Medication Assistance / Insurance Benefits Check: Does the patient have prescription insurance?  Yes Type of insurance plan: [employer]  Outpatient Pharmacy:  Prior to admission outpatient pharmacy: [facility] Is the patient willing to use [facility] pharmacy at discharge? No - SNF   Assessment: 1. Acute on chronic systolic and diastolic CHF (LVEF 10%) with RV dysfunction. NYHA class III symptoms. - Volume overloaded on exam with 1+  edema, no JVD - continue furosemide 80 mg IV BID. May transition to PO soon. Keep K>4 and Mag>2. - Continue metoprolol XL 50 mg qhs. Is V-paced to 60 bpm. - Off lisinopril with soft BP - Continue Farxiga 10 mg daily - Continue digoxin 0.125 mg daily with severe RV dysfunction - level today ok. May need to recheck once more before discharge.   Plan: 1) Medication changes recommended at this time: - Continue current regimen - Stop Actos on discharge  2) Patient assistance: - None, SNF at discharge  3)  Education  - To be completed prior to discharge  Kerby Nora, PharmD, BCPS Heart Failure Stewardship Pharmacist Phone [phone]

## 2021-10-03 NOTE — Progress Notes (Signed)
Progress Note  Patient Name: Gregory [name] Date of Encounter: 10/03/2021  Primary Cardiologist: Larae Grooms, MD  Subjective   Saw PC in interim.  Now DNR. Patient notes burning urination and loose stools.  No breathing issues.  Inpatient Medications    Scheduled Meds:  atorvastatin  20 mg Oral Daily   dapagliflozin propanediol  10 mg Oral Daily   digoxin  0.125 mg Oral Daily   feeding supplement  237 mL Oral BID BM   furosemide  80 mg Intravenous Q12H   insulin aspart  0-5 Units Subcutaneous QHS   insulin aspart  0-6 Units Subcutaneous TID WC   metoprolol succinate  50 mg Oral QHS   phenazopyridine  100 mg Oral TID WC   sodium chloride flush  3 mL Intravenous Q12H   spironolactone  12.5 mg Oral Daily   tamsulosin  0.4 mg Oral Daily   warfarin  2.5 mg Oral q1600   Warfarin - Pharmacist Dosing Inpatient   Does not apply q1600    cefTRIAXone (ROCEPHIN)  IV 1 g (10/02/21 1637)   PRN Meds: acetaminophen **OR** acetaminophen, mouth rinse, senna-docusate   Vital Signs    Vitals:   10/02/21 0355 10/02/21 0922 10/02/21 2038 10/03/21 0608  BP: (!) 128/50 (!) 116/55 (!) 117/57 118/66  Pulse: 66 66 66 66  Resp: '18 18 20 17  '$ Temp: 98.1 F (36.7 C) 97.8 F (36.6 C) 98.2 F (36.8 C) 99.4 F (37.4 C)  TempSrc: Oral Oral Oral Oral  SpO2: 92% 93% 94% 96%  Weight: 107.2 kg     Height:        Intake/Output Summary (Last 24 hours) at 10/03/2021 1049 Last data filed at 10/03/2021 0600 Gross per 24 hour  Intake 480 ml  Output 2900 ml  Net -2420 ml   Filed Weights   09/30/21 0403 10/01/21 0331 10/02/21 0355  Weight: 114.9 kg 111.3 kg 107.2 kg    Telemetry    Predominantly AF VP.  Personally reviewed.  Physical Exam   GEN: No acute distress.   Neck: No JVD. Cardiac: IRIR, 2/6 midsystolic murmur diastolic murmur as well, no gallop.  Respiratory: Nonlabored. Clear to auscultation bilaterally. GI: Protuberant, bowel sounds present. MS: I+1 bilateral edema above  where is legs are wrapped Neuro:  Nonfocal. Psych: Alert and oriented x 3. Normal affect.  Labs    Chemistry Recent Labs  Lab 09/27/21 1952 09/28/21 1202 10/01/21 0400 10/02/21 0418 10/03/21 0730  NA 138   < > 136 138 136  K 5.2*   < > 4.3 3.9 3.4*  CL 105   < > 98 96* 94*  CO2 23   < > 30 33* 34*  GLUCOSE 119*   < > 109* 108* 98  BUN 39*   < > 31* 28* 26*  CREATININE 1.09   < > 1.05 1.02 1.12  CALCIUM 8.8*   < > 8.3* 8.6* 8.5*  PROT 5.7*  --   --   --   --   ALBUMIN 3.3*  --   --   --   --   AST 28  --   --   --   --   ALT 19  --   --   --   --   ALKPHOS 120  --   --   --   --   BILITOT 0.8  --   --   --   --   GFRNONAA >60   < > >  60 >60 >60  ANIONGAP 10   < > '8 9 8   '$ < > = values in this interval not displayed.     Hematology Recent Labs  Lab 10/01/21 0400 10/02/21 0418 10/03/21 0730  WBC 6.1 8.8 11.2*  RBC 3.27* 3.49* 3.44*  HGB 10.2* 10.8* 10.5*  HCT 30.7* 32.6* 32.0*  MCV 93.9 93.4 93.0  MCH 31.2 30.9 30.5  MCHC 33.2 33.1 32.8  RDW 16.7* 16.4* 16.2*  PLT 152 174 158    Cardiac Enzymes Recent Labs  Lab 09/27/21 1952 09/27/21 2209  TROPONINIHS 42* 45*    BNP Recent Labs  Lab 09/27/21 1952  BNP 354.5*     Radiology    No results found.  Cardiac Studies   Echocardiogram 09/22/2021:  1. Distal inferolateral and apical hypokinesis. . Left ventricular  ejection fraction, by estimation, is 45%%. The left ventricle has mildly  decreased function. The left ventricular internal cavity size was mildly  dilated.   2. Right ventricular systolic function is severely reduced. The right  ventricular size is moderately enlarged. There is moderately elevated  pulmonary artery systolic pressure.   3. Left atrial size was moderate to severely dilated.   4. Right atrial size was severely dilated.   5. Mitral valve is thickened with severe annular calcifications. Peak and  mean gradient through the valve are 24 and 7 mm Hg respectively.  Consistent with  at least moderate to severe MS Mitral regurgitant jet is  directed posterior into LA. Vena  contracta is 0.74; MR volume 55 ml OVerall consistent with severe MR. .  Severe mitral valve regurgitation. Severe mitral annular calcification.   6. S/P TAVR (29 mm Sapien prosthesis, implant date 09/16/19) Leaflets  appear to open well Peak and mean gradients through the valve are 17 and 9  mm Hg respectively AVA (VTI) is 1.8 cm2.      Mild perivalvular AI. No signficant change from echo in July 2022. The  aortic valve has been repaired/replaced. Aortic valve regurgitation is  mild. There is a 29 mm Sapien prosthetic (TAVR) valve present in the  aortic position. Procedure Date:  09/16/19.   7. Aortic dilatation noted. There is mild dilatation of the ascending  aorta, measuring 40 mm.   8. The inferior vena cava is dilated in size with <50% respiratory  variability, suggesting right atrial pressure of 15 mmHg.   Assessment & Plan    Heart Failure Reduced Ejection Fraction  Mixed Mitral disease significant MS and MR poor  m-TEER candidate in the setting of gradients of 7 mm Hg at low heart rate S/p TAVR with mild PVL NYHA class III, Stage IV, hypervolemic, etiology from mixed valve disease and AF, complicated by RV dysfunction Diuretic: 80 IV BID with good response - continue farxiga 10  - continue succinate 50 mg - Increase aldactone to 25 mg to decrease K burden - little BP room for ANRI or ARB presently  Potential PO diuretic change today.  Complete heart block with Medtronic pacemaker in place, ventricular pacing noted.  Permanent atrial fibrillation, on Coumadin for anticoagulation.  CHA2DS2-VASc score is 5.  We had discussed limited long term solutions for his valve disease.  As outpatient we will need to discuss [place] in more detail, again.  Notes lactose intolerance   Signed, Werner Lean, MD  10/03/2021, 10:49 AM

## 2021-10-03 NOTE — Progress Notes (Signed)
ANTICOAGULATION CONSULT NOTE - Follow Up Consult  Pharmacy Consult for Warfarin Indication: atrial fibrillation  Allergies  Allergen Reactions   Aspirin     visual issues and fatigue   Macrobid [Nitrofurantoin]    Nystatin    Prevnar 13 [Pneumococcal 13-Val Conj Vacc]    Bisoprolol-Hydrochlorothiazide Other (See Comments)    "Severe" arthralgias and myalgias   Nsaids Other (See Comments)    GI upset, Peptic ulcer disease    Patient Measurements: Height: '5\' 10"'$  (177.8 cm) Weight: 107.2 kg (236 lb 5.3 oz) IBW/kg (Calculated) : 73  Vital Signs: Temp: 99.4 F (37.4 C) (07/03 0608) Temp Source: Oral (07/03 0608) BP: 118/66 (07/03 7169) Pulse Rate: 66 (07/03 0608)  Labs: Recent Labs    10/01/21 0400 10/02/21 0418 10/03/21 0730  HGB 10.2* 10.8* 10.5*  HCT 30.7* 32.6* 32.0*  PLT 152 174 158  LABPROT 21.6* 18.4* 19.7*  INR 1.9* 1.6* 1.7*  CREATININE 1.05 1.02 1.12     Estimated Creatinine Clearance: 63.4 mL/min (by C-G formula based on SCr of 1.12 mg/dL).   Assessment: 81 yo male presented 09/27/21 pm c/o worsening edema of BUE/BLE with exertional dyspnea, admitted for acute on chronic HF exacerbation. PTA Warfarin for atrial fibrillation continued.   PTA Warfarin regimen: 2.5 mg daily. Last PTA dose 6/26 at bedtime.  INR fell to 1.6 <  goal range on PTA dose warfarin 2.'5mg'$  daily.  Tried to boost dose earlier in stay and patient refused. Discussed with patient and he received higher dose 7/2.  CBC stable.   Goal of Therapy:  INR 2-3 Monitor platelets by anticoagulation protocol: Yes   Plan:  Continue warfarin 2.5 mg daily. Daily PT/INR for now.  [employer], Pharm.D., BCPS Clinical Pharmacist  **Pharmacist phone directory can be found on [email or web address] listed under [place].  10/03/2021 10:09 AM

## 2021-10-03 NOTE — Progress Notes (Signed)
PROGRESS NOTE  Gregory [name]  DOB: 1941-04-03  PCP: Kathyrn Lass, MD JME:[id number]  DOA: 09/27/2021  LOS: 5 days  Hospital Day: 7  Brief narrative: Gregory [name] is a 81 y.o. male with PMH significant for obesity, OSA, DM2, HTN, HLD, chronic A-fib on Coumadin, chronic diastolic CHF, severe AS s/p TAVR, severe MR, moderate to severe MS, history of GI bleed requiring transfusions, non-Hodgkin's lymphoma. Patient presented to the ED on 6/27 with complaint of progressively worsening exertional dyspnea and bilateral pedal edema. Admitted for CHF exacerbation Cardiology following.  See below for details  Subjective: Patient was seen and examined this morning.  Pleasant elderly Caucasian male.  Propped up in bed.  Not in distress.  Breathing on room air.  Hard of hearing.  Otherwise alert, awake, able to answer questions appropriately. Chart reviewed Last 24 hours, Tmax 99.4, heart rate in 60s, blood pressure 118/66 this morning, breathing on room air  Assessment and plan: Acute on chronic systolic heart failure  -Presented with progressive dyspnea -Echo with EF 45%, severely reduced RV systolic function, severe MR, moderate to severe MS.  -Currently on IV Lasix 80 mg twice daily.  Diuresed more than 16 L of urine.  Noted a plan from cardiology to switch to oral diuretics today. -Also continue metoprolol, digoxin, Aldactone, Farxiga -Net IO Since Admission: -17,316 mL [10/03/21 1148] -Continue to monitor for daily intake output, weight, blood pressure, BNP, renal function and electrolytes. Recent Labs  Lab 09/27/21 1952 09/28/21 1202 09/29/21 0642 09/30/21 0221 10/01/21 0400 10/02/21 0418 10/03/21 0730  BNP 354.5*  --   --   --   --   --   --   BUN 39*   < > 35* 33* 31* 28* 26*  CREATININE 1.09   < > 1.13 1.09 1.05 1.02 1.12  K 5.2*   < > 4.8 4.6 4.3 3.9 3.4*  MG  --   --  2.0  --   --   --   --    < > = values in this interval not displayed.   Severe MR Moderate to severe  MS -Unfortunately not a surgical candidate.  -Continue medical management    Severe AS s/p TAVR -Outpatient follow up with cardiology.    Complete heart block s/p PPM -Paced on the monitor   Atrial fibrillation -Rate controlled with metoprolol.  Continue Coumadin. -INR subtherapeutic.  Pharmacy helping with dosing Recent Labs  Lab 09/29/21 1103 09/30/21 0221 10/01/21 0400 10/02/21 0418 10/03/21 0730  INR 1.8* 2.0* 1.9* 1.6* 1.7*   Type 2 diabetes mellitus -A1c 6.3 on 09/29/2021 -Home meds include Actos 50 mg daily, metformin 5 mg daily.  Currently metformin on hold.  I would not resume Actos because of risk of recurrence of CHF flareup. -Currently on sliding scale insulin with Accu-Cheks Recent Labs  Lab 10/02/21 1156 10/02/21 1627 10/02/21 2148 10/03/21 0630 10/03/21 1108  GLUCAP 128* 115* 151* 110* 104*   Stage II CKD -Creatinine stable Recent Labs    09/27/21 1952 09/28/21 1202 09/29/21 0642 09/30/21 0221 10/01/21 0400 10/02/21 0418 10/03/21 0730  BUN 39* 38* 35* 33* 31* 28* 26*  CREATININE 1.09 1.11 1.13 1.09 1.05 1.02 1.12   Anemia of chronic disease -Hemoglobin stable between 10 and 11.  Continue to monitor Recent Labs    09/29/21 0642 09/30/21 0221 10/01/21 0400 10/02/21 0418 10/03/21 0730  HGB 11.0* 10.2* 10.2* 10.8* 10.5*  MCV 95.7 95.5 93.9 93.4 93.0   UTI -7/2, complaint of  urinary frequency. -Urinalysis showed large leukocytes, positive nitrites with pyuria.  -Urine culture pending -Currently on Rocephin  Goals of care   Code Status: DNR  Palliative care consult appreciated.  Mobility: Encourage ambulation.  PT eval obtained.  SNF recommended  Skin assessment:     Nutritional status:  Body mass index is 33.91 kg/m.  Nutrition Problem: Inadequate oral intake Etiology:  (social situation) Signs/Symptoms: per patient/family report     Diet:  Diet Order             Diet heart healthy/carb modified Room service  appropriate? Yes; Fluid consistency: Thin  Diet effective now                   DVT prophylaxis:   warfarin (COUMADIN) tablet 2.5 mg   Antimicrobials: IV Rocephin Fluid: None Consultants: Cardiology Family Communication: None at bedside  Status is: Inpatient  Continue in-hospital care because: Medication adjustment.  Pending urine culture report Level of care: Telemetry Cardiac   Dispo: The patient is from: Home              Anticipated d/c is to: SNF              Patient currently is not medically stable to d/c.   Difficult to place patient No     Infusions:   cefTRIAXone (ROCEPHIN)  IV 1 g (10/03/21 1134)    Scheduled Meds:  atorvastatin  20 mg Oral Daily   dapagliflozin propanediol  10 mg Oral Daily   digoxin  0.125 mg Oral Daily   feeding supplement  237 mL Oral BID BM   furosemide  80 mg Intravenous Q12H   insulin aspart  0-5 Units Subcutaneous QHS   insulin aspart  0-6 Units Subcutaneous TID WC   metoprolol succinate  50 mg Oral QHS   phenazopyridine  100 mg Oral TID WC   sodium chloride flush  3 mL Intravenous Q12H   [START ON 10/04/2021] spironolactone  25 mg Oral Daily   tamsulosin  0.4 mg Oral Daily   warfarin  2.5 mg Oral q1600   Warfarin - Pharmacist Dosing Inpatient   Does not apply q1600    PRN meds: acetaminophen **OR** acetaminophen, mouth rinse, senna-docusate   Antimicrobials: Anti-infectives (From admission, onward)    Start     Dose/Rate Route Frequency Ordered Stop   10/02/21 1035  cefTRIAXone (ROCEPHIN) 1 g in sodium chloride 0.9 % 100 mL IVPB        1 g 200 mL/hr over 30 Minutes Intravenous Every 24 hours 10/02/21 1035         Objective: Vitals:   10/03/21 0608 10/03/21 1052  BP: 118/66 (!) 103/46  Pulse: 66 62  Resp: 17 18  Temp: 99.4 F (37.4 C) 97.9 F (36.6 C)  SpO2: 96% 94%    Intake/Output Summary (Last 24 hours) at 10/03/2021 1148 Last data filed at 10/03/2021 1053 Gross per 24 hour  Intake 480 ml  Output 3250 ml   Net -2770 ml   Filed Weights   09/30/21 0403 10/01/21 0331 10/02/21 0355  Weight: 114.9 kg 111.3 kg 107.2 kg   Weight change:  Body mass index is 33.91 kg/m.   Physical Exam: General exam: Pleasant, elderly male.  Not in physical distress Skin: No rashes, lesions or ulcers. HEENT: Atraumatic, normocephalic, no obvious bleeding Lungs: Clear to auscultation bilaterally CVS: Regular rate and rhythm, no murmur GI/Abd soft, nontender, nondistended, bowel sound present CNS: Alert, awake, oriented x3.  Hard of hearing Psychiatry: Mood appropriate Extremities: No pedal edema, no calf tenderness.  Compression bandage on both legs noted  Data Review: I have personally reviewed the laboratory data and studies available.  F/u labs ordered Unresulted Labs (From admission, onward)     Start     Ordered   10/02/21 0500  CBC  Daily,   R      10/01/21 1421   10/02/21 6734  Basic metabolic panel  Daily,   R      10/01/21 1421   09/29/21 1023  Protime-INR  Daily,   R      09/29/21 1022            Signed, Terrilee Croak, MD [facility] 10/03/2021

## 2021-10-04 DIAGNOSIS — I442 Atrioventricular block, complete: Secondary | ICD-10-CM | POA: Diagnosis not present

## 2021-10-04 DIAGNOSIS — I350 Nonrheumatic aortic (valve) stenosis: Secondary | ICD-10-CM | POA: Diagnosis not present

## 2021-10-04 DIAGNOSIS — I059 Rheumatic mitral valve disease, unspecified: Secondary | ICD-10-CM | POA: Diagnosis not present

## 2021-10-04 DIAGNOSIS — C8190 Hodgkin lymphoma, unspecified, unspecified site: Secondary | ICD-10-CM | POA: Diagnosis not present

## 2021-10-04 DIAGNOSIS — D638 Anemia in other chronic diseases classified elsewhere: Secondary | ICD-10-CM | POA: Diagnosis not present

## 2021-10-04 DIAGNOSIS — E119 Type 2 diabetes mellitus without complications: Secondary | ICD-10-CM | POA: Diagnosis not present

## 2021-10-04 DIAGNOSIS — N3000 Acute cystitis without hematuria: Secondary | ICD-10-CM | POA: Diagnosis not present

## 2021-10-04 DIAGNOSIS — Z952 Presence of prosthetic heart valve: Secondary | ICD-10-CM | POA: Diagnosis not present

## 2021-10-04 DIAGNOSIS — I482 Chronic atrial fibrillation, unspecified: Secondary | ICD-10-CM | POA: Diagnosis not present

## 2021-10-04 DIAGNOSIS — M1990 Unspecified osteoarthritis, unspecified site: Secondary | ICD-10-CM | POA: Diagnosis not present

## 2021-10-04 DIAGNOSIS — Z7901 Long term (current) use of anticoagulants: Secondary | ICD-10-CM | POA: Diagnosis not present

## 2021-10-04 DIAGNOSIS — N390 Urinary tract infection, site not specified: Secondary | ICD-10-CM | POA: Diagnosis not present

## 2021-10-04 DIAGNOSIS — G4733 Obstructive sleep apnea (adult) (pediatric): Secondary | ICD-10-CM | POA: Diagnosis not present

## 2021-10-04 DIAGNOSIS — R2689 Other abnormalities of gait and mobility: Secondary | ICD-10-CM | POA: Diagnosis not present

## 2021-10-04 DIAGNOSIS — Z794 Long term (current) use of insulin: Secondary | ICD-10-CM | POA: Diagnosis not present

## 2021-10-04 DIAGNOSIS — Z89021 Acquired absence of right finger(s): Secondary | ICD-10-CM | POA: Diagnosis not present

## 2021-10-04 DIAGNOSIS — I5022 Chronic systolic (congestive) heart failure: Secondary | ICD-10-CM | POA: Diagnosis not present

## 2021-10-04 DIAGNOSIS — E669 Obesity, unspecified: Secondary | ICD-10-CM | POA: Diagnosis not present

## 2021-10-04 DIAGNOSIS — M2550 Pain in unspecified joint: Secondary | ICD-10-CM | POA: Diagnosis not present

## 2021-10-04 DIAGNOSIS — I5033 Acute on chronic diastolic (congestive) heart failure: Secondary | ICD-10-CM | POA: Diagnosis not present

## 2021-10-04 DIAGNOSIS — E785 Hyperlipidemia, unspecified: Secondary | ICD-10-CM | POA: Diagnosis not present

## 2021-10-04 DIAGNOSIS — E1159 Type 2 diabetes mellitus with other circulatory complications: Secondary | ICD-10-CM | POA: Diagnosis not present

## 2021-10-04 DIAGNOSIS — R112 Nausea with vomiting, unspecified: Secondary | ICD-10-CM | POA: Diagnosis not present

## 2021-10-04 DIAGNOSIS — Z7401 Bed confinement status: Secondary | ICD-10-CM | POA: Diagnosis not present

## 2021-10-04 DIAGNOSIS — I959 Hypotension, unspecified: Secondary | ICD-10-CM | POA: Diagnosis not present

## 2021-10-04 DIAGNOSIS — B964 Proteus (mirabilis) (morganii) as the cause of diseases classified elsewhere: Secondary | ICD-10-CM | POA: Diagnosis not present

## 2021-10-04 DIAGNOSIS — I5032 Chronic diastolic (congestive) heart failure: Secondary | ICD-10-CM | POA: Diagnosis not present

## 2021-10-04 DIAGNOSIS — R609 Edema, unspecified: Secondary | ICD-10-CM | POA: Diagnosis not present

## 2021-10-04 DIAGNOSIS — I5031 Acute diastolic (congestive) heart failure: Secondary | ICD-10-CM | POA: Diagnosis not present

## 2021-10-04 LAB — CBC
HCT: 30.3 % — ABNORMAL LOW (ref 39.0–52.0)
Hemoglobin: 10.3 g/dL — ABNORMAL LOW (ref 13.0–17.0)
MCH: 31.5 pg (ref 26.0–34.0)
MCHC: 34 g/dL (ref 30.0–36.0)
MCV: 92.7 fL (ref 80.0–100.0)
Platelets: 156 10*3/uL (ref 150–400)
RBC: 3.27 MIL/uL — ABNORMAL LOW (ref 4.22–5.81)
RDW: 16.1 % — ABNORMAL HIGH (ref 11.5–15.5)
WBC: 9.5 10*3/uL (ref 4.0–10.5)
nRBC: 0 % (ref 0.0–0.2)

## 2021-10-04 LAB — URINE CULTURE: Culture: >=100000 — AB

## 2021-10-04 LAB — GLUCOSE, CAPILLARY
Glucose-Capillary: 88 mg/dL (ref 70–99)
Glucose-Capillary: 123 mg/dL — ABNORMAL HIGH (ref 70–99)
Glucose-Capillary: 113 mg/dL — ABNORMAL HIGH (ref 70–99)

## 2021-10-04 LAB — BASIC METABOLIC PANEL
Anion gap: 12 (ref 5–15)
BUN: 30 mg/dL — ABNORMAL HIGH (ref 8–23)
CO2: 34 mmol/L — ABNORMAL HIGH (ref 22–32)
Calcium: 8.3 mg/dL — ABNORMAL LOW (ref 8.9–10.3)
Chloride: 92 mmol/L — ABNORMAL LOW (ref 98–111)
Creatinine, Ser: 1.24 mg/dL (ref 0.61–1.24)
GFR, Estimated: 58 mL/min — ABNORMAL LOW (ref >60)
Glucose, Bld: 107 mg/dL — ABNORMAL HIGH (ref 70–99)
Potassium: 3.4 mmol/L — ABNORMAL LOW (ref 3.5–5.1)
Sodium: 138 mmol/L (ref 135–145)

## 2021-10-04 LAB — PROTIME-INR
INR: 1.9 — ABNORMAL HIGH (ref 0.8–1.2)
Prothrombin Time: 21.9 seconds — ABNORMAL HIGH (ref 11.4–15.2)

## 2021-10-04 MED ORDER — INSULIN ASPART 100 UNIT/ML IJ SOLN: 0.0000 [IU] | Freq: Three times a day (TID) | INTRAMUSCULAR | 11 refills | Status: DC

## 2021-10-04 MED ORDER — POTASSIUM CHLORIDE CRYS ER 20 MEQ PO TBCR: 20.0000 meq | EXTENDED_RELEASE_TABLET | Freq: Every day | ORAL | Status: DC

## 2021-10-04 MED ORDER — SENNOSIDES-DOCUSATE SODIUM 8.6-50 MG PO TABS: 1.0000 | ORAL_TABLET | Freq: Every evening | ORAL | Status: AC | PRN

## 2021-10-04 MED ORDER — ENSURE ENLIVE PO LIQD: 237.0000 mL | Freq: Two times a day (BID) | ORAL | 12 refills | Status: AC

## 2021-10-04 MED ORDER — SPIRONOLACTONE 25 MG PO TABS: 25.0000 mg | ORAL_TABLET | Freq: Every day | ORAL | Status: DC

## 2021-10-04 MED ORDER — FUROSEMIDE 80 MG PO TABS: 80.0000 mg | ORAL_TABLET | Freq: Two times a day (BID) | ORAL | Status: DC

## 2021-10-04 MED ORDER — POTASSIUM CHLORIDE CRYS ER 20 MEQ PO TBCR
20.0000 meq | EXTENDED_RELEASE_TABLET | Freq: Every day | ORAL | Status: DC
  Administered 2021-10-04: 20 meq via ORAL
  Filled 2021-10-04: qty 1

## 2021-10-04 MED ORDER — CEPHALEXIN 500 MG PO CAPS: 500.0000 mg | ORAL_CAPSULE | Freq: Three times a day (TID) | ORAL | Status: AC

## 2021-10-04 MED ORDER — METOPROLOL SUCCINATE ER 50 MG PO TB24
50.0000 mg | ORAL_TABLET | Freq: Every day | ORAL | Status: AC
Start: 2021-10-04 — End: ?

## 2021-10-04 MED ORDER — DAPAGLIFLOZIN PROPANEDIOL 10 MG PO TABS: 10.0000 mg | ORAL_TABLET | Freq: Every day | ORAL | Status: AC

## 2021-10-04 MED ORDER — LOSARTAN POTASSIUM 25 MG PO TABS: 12.5000 mg | ORAL_TABLET | Freq: Every day | ORAL | Status: DC

## 2021-10-04 MED ORDER — FUROSEMIDE 40 MG PO TABS
80.0000 mg | ORAL_TABLET | Freq: Two times a day (BID) | ORAL | Status: DC
  Administered 2021-10-04 (×2): 80 mg via ORAL
  Filled 2021-10-04 (×2): qty 2

## 2021-10-04 NOTE — Discharge Summary (Signed)
Physician Discharge Summary  MACCOY HAUBNER RWE:[id number] DOB: [birth year] DOA: 09/27/2021  PCP: Kathyrn Lass, MD  Admit date: 09/27/2021 Discharge date: 10/04/2021  Admitted From: home Discharge disposition: SNF      Brief narrative: Gregory [name] is a 81 y.o. male with PMH significant for obesity, OSA, DM2, HTN, HLD, chronic A-fib on Coumadin, chronic diastolic CHF, severe AS s/p TAVR, severe MR, moderate to severe MS, history of GI bleed requiring transfusions, non-Hodgkin's lymphoma. Patient presented to the ED on 6/27 with complaint of progressively worsening exertional dyspnea and bilateral pedal edema. Admitted for CHF exacerbation Cardiology following.  See below for details  Subjective: Patient was seen and examined this morning.  Pleasant elderly Caucasian male.  Propped up in bed.  Not in distress.  Breathing on room air.  Hard of hearing.  Otherwise alert, awake, able to answer questions appropriately. Chart reviewed Last 24 hours, Tmax 99.4, heart rate in 60s, blood pressure 118/66 this morning, breathing on room air  Hospital course: Acute on chronic systolic heart failure and right-sided heart failure -Presented with progressive dyspnea -Echo with EF 45%, severely reduced RV systolic function, severe MR, moderate to severe MS.  -Aggressively diuresed with IV Lasix.  More than 17 L of diuresis.   -Per cardiology recommendation, will discharge the patient on Toprol 50 mg daily, Farxiga 10 mg daily, p.o. Lasix 80 mg twice daily, Aldactone 25 mg daily, losartan 12.5 mg daily and KCl 20 mg daily. -Also continue metoprolol, digoxin, Aldactone, Farxiga -Net IO Since Admission: -17,536 mL [10/04/21 1339] -Continue to monitor for daily intake output, weight, blood pressure, BNP, renal function and electrolytes. Recent Labs  Lab 09/27/21 1952 09/28/21 1202 09/29/21 0642 09/30/21 0221 10/01/21 0400 10/02/21 0418 10/03/21 0730 10/04/21 0204  BNP 354.5*  --   --   --   --    --   --   --   BUN 39*   < > 35* 33* 31* 28* 26* 30*  CREATININE 1.09   < > 1.13 1.09 1.05 1.02 1.12 1.24  K 5.2*   < > 4.8 4.6 4.3 3.9 3.4* 3.4*  MG  --   --  2.0  --   --   --   --   --    < > = values in this interval not displayed.   Severe MR Moderate to severe MS -Unfortunately not a surgical candidate.  -Continue medical management    Severe AS s/p TAVR -Outpatient follow up with cardiology.    Complete heart block s/p PPM -Paced on the monitor   Permanent atrial fibrillation -Rate controlled with metoprolol.  Continue Coumadin.  Type 2 diabetes mellitus -A1c 6.3 on 09/29/2021 -Home meds include Actos 50 mg daily, metformin 500 mg daily.  Currently both meds are on hold.  -Would avoid Actos completely because of coexisting CHF.  Metformin 500 mg daily can resume post discharge  -Can continue on sliding scale insulin with Accu-Cheks Recent Labs  Lab 10/03/21 1108 10/03/21 1622 10/03/21 2125 10/04/21 0612 10/04/21 1121  GLUCAP 104* 102* 127* 88 123*   Stage II CKD -Creatinine stable Recent Labs    09/27/21 1952 09/28/21 1202 09/29/21 0642 09/30/21 0221 10/01/21 0400 10/02/21 0418 10/03/21 0730 10/04/21 0204  BUN 39* 38* 35* 33* 31* 28* 26* 30*  CREATININE 1.09 1.11 1.13 1.09 1.05 1.02 1.12 1.24   Anemia of chronic disease -Hemoglobin stable between 10 and 11.  Continue to monitor Recent Labs    09/30/21 0221  10/01/21 0400 10/02/21 0418 10/03/21 0730 10/04/21 0204  HGB 10.2* 10.2* 10.8* 10.5* 10.3*  MCV 95.5 93.9 93.4 93.0 92.7   Proteus UTI -7/2, complaint of urinary frequency. -Urinalysis showed large leukocytes, positive nitrites with pyuria.  -Urine culture grew more than 100,000 CFU per mL of Proteus mirabilis -Currently on Rocephin.  Switch to oral Keflex at discharge for next 3 days.  Goals of care   Code Status: DNR  Palliative care consult appreciated.  Mobility: Encourage ambulation.  PT eval obtained.  SNF recommended   Wounds:   - Incision (Closed) 09/16/19 Groin Right (Active)  Date First Assessed/Time First Assessed: 09/16/19 1900   Location: Groin  Location Orientation: Right    Assessments 09/16/2019  8:00 PM 09/19/2019  8:30 AM  Dressing Type Gauze (Comment) Adhesive strips  Dressing Clean;Dry;Intact Clean;Dry;Intact  Site / Wound Assessment Clean;Dry Dressing in place / Unable to assess  Drainage Amount None --  Treatment Other (Comment) --     No associated orders.     Incision (Closed) 09/16/19 Groin Left (Active)  Date First Assessed/Time First Assessed: 09/16/19 1900   Location: Groin  Location Orientation: Left    Assessments 09/16/2019  8:00 PM 09/19/2019  8:30 AM  Dressing Type Gauze (Comment) Gauze (Comment)  Dressing Clean;Dry;Intact Clean;Dry;Intact  Site / Wound Assessment Clean;Dry Dressing in place / Unable to assess  Drainage Amount None --  Treatment Other (Comment) --     No associated orders.     Incision (Closed) 09/18/19 Chest Left;Upper (Active)  Date First Assessed/Time First Assessed: 09/18/19 1229   Location: Chest  Location Orientation: Left;Upper  Present on Admission: No    Assessments 09/18/2019  8:30 AM 09/19/2019  8:30 AM  Dressing Type Gauze (Comment);Transparent dressing Adhesive strips  Dressing Clean;Dry;Intact Clean;Dry;Intact  Site / Wound Assessment -- Dressing in place / Unable to assess  Margins -- Attached edges (approximated)  Drainage Amount None --     No associated orders.     Incision (Closed) 09/18/19 Chest Right;Upper (Active)  Date First Assessed/Time First Assessed: 09/18/19 1229   Location: Chest  Location Orientation: Right;Upper  Present on Admission: No    Assessments 09/18/2019  8:30 AM 09/19/2019  8:30 AM  Dressing Type Gauze (Comment);Transparent dressing Adhesive strips  Dressing Clean;Dry;Old drainage (marked) Clean;Dry;Intact  Site / Wound Assessment -- Dressing in place / Unable to assess  Drainage Amount Moderate --  Drainage Description  Serosanguineous --     No associated orders.    Discharge Exam:   Vitals:   10/03/21 1052 10/03/21 1957 10/04/21 0634 10/04/21 1117  BP: (!) 103/46 (!) 117/50 (!) 105/47 (!) 96/47  Pulse: 62 68 62 61  Resp: '18 15 20 20  '$ Temp: 97.9 F (36.6 C) 98.6 F (37 C) 98.6 F (37 C) 97.6 F (36.4 C)  TempSrc: Oral Oral Oral Oral  SpO2: 94% 95% 91% 91%  Weight:   109 kg   Height:        Body mass index is 34.48 kg/m.  General exam: Pleasant, elderly male.  Not in physical distress Skin: No rashes, lesions or ulcers. HEENT: Atraumatic, normocephalic, no obvious bleeding Lungs: Clear to auscultation bilaterally CVS: Regular rate and rhythm, systolic ejection murmur GI/Abd soft, nontender, nondistended, bowel sound present CNS: Alert, awake, oriented x3.  Hard of hearing Psychiatry: Mood appropriate Extremities: No pedal edema, no calf tenderness.  Compression bandage on both legs noted  Follow ups:    Follow-up Information     Kathyrn Lass,  MD Follow up.   Specialty: Family Medicine Contact information: [place] [address] [phone]         Jettie Booze, MD .   Specialties: Cardiology, Radiology, Interventional Cardiology Contact information: [address] [address] [phone]         Constance Haw, MD .   Specialty: Cardiology Contact information: [address] [place] [address] [place] [address] [phone]                 Discharge Instructions:   Discharge Instructions     Call MD for:  difficulty breathing, headache or visual disturbances   Complete by: As directed    Call MD for:  extreme fatigue   Complete by: As directed    Call MD for:  hives   Complete by: As directed    Call MD for:  persistant dizziness or light-headedness   Complete by: As directed    Call MD for:  persistant nausea and vomiting   Complete by: As directed    Call MD for:  severe uncontrolled  pain   Complete by: As directed    Call MD for:  temperature >100.4   Complete by: As directed    Diet general   Complete by: As directed    Discharge instructions   Complete by: As directed    Discharge instructions for diabetes mellitus: Check blood sugar 3 times a day and bedtime at home. If blood sugar running above 200 or less than 70 please call your MD to adjust insulin. If you notice signs and symptoms of hypoglycemia (low blood sugar) like jitteriness, confusion, thirst, tremor and sweating, please check blood sugar, drink sugary drink/biscuits/sweets to increase sugar level and call MD or return to ER.    Discharge instructions for CHF Check weight daily -preferably same time every day. Restrict fluid intake to 1200 ml daily Restrict salt intake to less than 2 g daily. Call MD if you have one of the following symptoms 1) 3 pound weight gain in 24 hours or 5 pounds in 1 week  2) swelling in the hands, feet or stomach  3) progressive shortness of breath 4) if you have to sleep on extra pillows at night in order to breathe     General discharge instructions: Follow with Primary MD Kathyrn Lass, MD in 7 days  Please request your PCP  to go over your hospital tests, procedures, radiology results at the follow up. Please get your medicines reviewed and adjusted.  Your PCP may decide to repeat certain labs or tests as needed. Do not drive, operate heavy machinery, perform activities at heights, swimming or participation in water activities or provide baby sitting services if your were admitted for syncope or siezures until you have seen by Primary MD or a Neurologist and advised to do so again. [place] Controlled Substance Reporting System database was reviewed. Do not drive, operate heavy machinery, perform activities at heights, swim, participate in water activities or provide baby-sitting services while on medications for pain, sleep and mood until your outpatient  physician has reevaluated you and advised to do so again.  You are strongly recommended to comply with the dose, frequency and duration of prescribed medications. Activity: As tolerated with Full fall precautions use walker/cane & assistance as needed Avoid using any recreational substances like cigarette, tobacco, alcohol, or non-prescribed drug. If you experience worsening of your admission symptoms, develop shortness of breath, life  threatening emergency, suicidal or homicidal thoughts you must seek medical attention immediately by calling 911 or calling your MD immediately  if symptoms less severe. You must read complete instructions/literature along with all the possible adverse reactions/side effects for all the medicines you take and that have been prescribed to you. Take any new medicine only after you have completely understood and accepted all the possible adverse reactions/side effects.  Wear Seat belts while driving. You were cared for by a hospitalist during your hospital stay. If you have any questions about your discharge medications or the care you received while you were in the hospital after you are discharged, you can call the unit and ask to speak with the hospitalist or the covering physician. Once you are discharged, your primary care physician will handle any further medical issues. Please note that NO REFILLS for any discharge medications will be authorized once you are discharged, as it is imperative that you return to your primary care physician (or establish a relationship with a primary care physician if you do not have one).   Increase activity slowly   Complete by: As directed        Discharge Medications:   Allergies as of 10/04/2021       Reactions   Aspirin    visual issues and fatigue   Macrobid [nitrofurantoin]    Nystatin    Prevnar 13 [pneumococcal 13-val Conj Vacc]    Bisoprolol-hydrochlorothiazide Other (See Comments)   "Severe" arthralgias and myalgias    Nsaids Other (See Comments)   GI upset, Peptic ulcer disease        Medication List     STOP taking these medications    diltiazem 120 MG 24 hr capsule Commonly known as: CARDIZEM CD   lisinopril 10 MG tablet Commonly known as: ZESTRIL   pioglitazone 15 MG tablet Commonly known as: ACTOS       TAKE these medications    acetaminophen 650 MG CR tablet Commonly known as: TYLENOL Take 650 mg by mouth every 8 (eight) hours as needed for pain.   atorvastatin 20 MG tablet Commonly known as: LIPITOR Take 20 mg by mouth daily.   cephALEXin 500 MG capsule Commonly known as: Keflex Take 1 capsule (500 mg total) by mouth 3 (three) times daily for 3 days.   cyanocobalamin 1000 MCG tablet Take 1,000 mcg by mouth daily.   dapagliflozin propanediol 10 MG Tabs tablet Commonly known as: FARXIGA Take 1 tablet (10 mg total) by mouth daily. Start taking on: October 05, 2021   feeding supplement Liqd Take 237 mLs by mouth 2 (two) times daily between meals.   furosemide 80 MG tablet Commonly known as: LASIX Take 1 tablet (80 mg total) by mouth 2 (two) times daily. What changed:  medication strength how much to take when to take this reasons to take this additional instructions   insulin aspart 100 UNIT/ML injection Commonly known as: novoLOG Inject 0-6 Units into the skin 3 (three) times daily with meals.   ketoconazole 2 % cream Commonly known as: NIZORAL Apply 1 Application topically 2 (two) times daily as needed for irritation.   losartan 25 MG tablet Commonly known as: COZAAR Take 0.5 tablets (12.5 mg total) by mouth at bedtime.   metFORMIN 500 MG 24 hr tablet Commonly known as: GLUCOPHAGE-XR Take 1 tablet (500 mg total) by mouth 2 (two) times daily. What changed: when to take this   metoprolol succinate 50 MG 24 hr tablet Commonly known as: TOPROL-XL  Take 1 tablet (50 mg total) by mouth at bedtime. Take with or immediately following a meal. What changed:   medication strength how much to take   ONE TOUCH ULTRA TEST test strip Generic drug: glucose blood 1 each by Other route as needed (BLOOD SUGAR).   OneTouch Delica Lancets 84O Misc Apply 1 each topically daily as needed (BLOOD SUGAR).   potassium chloride SA 20 MEQ tablet Commonly known as: KLOR-CON M Take 1 tablet (20 mEq total) by mouth daily. Start taking on: October 05, 2021   PRESERVISION AREDS 2 PO Take 1 tablet by mouth daily.   senna-docusate 8.6-50 MG tablet Commonly known as: Senokot-S Take 1 tablet by mouth at bedtime as needed for mild constipation.   spironolactone 25 MG tablet Commonly known as: ALDACTONE Take 1 tablet (25 mg total) by mouth daily. Start taking on: October 05, 2021   tamsulosin 0.4 MG Caps capsule Commonly known as: FLOMAX Take 0.4 mg by mouth daily.   warfarin 5 MG tablet Commonly known as: COUMADIN Take 2.5 mg by mouth at bedtime.         The results of significant diagnostics from this hospitalization (including imaging, microbiology, ancillary and laboratory) are listed below for reference.    Procedures and Diagnostic Studies:   DG Chest 2 View  Result Date: 09/27/2021 CLINICAL DATA:  Shortness of breath. EXAM: CHEST - 2 VIEW COMPARISON:  Radiograph 09/18/2019 FINDINGS: Right-sided pacemaker remains in place. Similar cardiomegaly. TAVR. There are bilateral pleural effusions and bibasilar opacities, likely representing atelectasis, left greater than right. Mild vascular congestion. Subtle Kerley B-lines/pulmonary edema. No pneumothorax. No acute osseous findings. IMPRESSION: Mild to moderate CHF. Electronically Signed   By: Keith Rake M.D.   On: 09/27/2021 20:31     Labs:   Basic Metabolic Panel: Recent Labs  Lab 09/29/21 0642 09/30/21 0221 10/01/21 0400 10/02/21 0418 10/03/21 0730 10/04/21 0204  NA 142 140 136 138 136 138  K 4.8 4.6 4.3 3.9 3.4* 3.4*  CL 102 101 98 96* 94* 92*  CO2 '29 30 30 '$ 33* 34* 34*  GLUCOSE 116*  120* 109* 108* 98 107*  BUN 35* 33* 31* 28* 26* 30*  CREATININE 1.13 1.09 1.05 1.02 1.12 1.24  CALCIUM 8.8* 8.5* 8.3* 8.6* 8.5* 8.3*  MG 2.0  --   --   --   --   --    GFR Estimated Creatinine Clearance: 57.8 mL/min (by C-G formula based on SCr of 1.24 mg/dL). Liver Function Tests: Recent Labs  Lab 09/27/21 1952  AST 28  ALT 19  ALKPHOS 120  BILITOT 0.8  PROT 5.7*  ALBUMIN 3.3*   No results for input(s): "LIPASE", "AMYLASE" in the last 168 hours. No results for input(s): "AMMONIA" in the last 168 hours. Coagulation profile Recent Labs  Lab 09/30/21 0221 10/01/21 0400 10/02/21 0418 10/03/21 0730 10/04/21 0204  INR 2.0* 1.9* 1.6* 1.7* 1.9*    CBC: Recent Labs  Lab 09/27/21 1952 09/29/21 0642 09/30/21 0221 10/01/21 0400 10/02/21 0418 10/03/21 0730 10/04/21 0204  WBC 7.4   < > 5.9 6.1 8.8 11.2* 9.5  NEUTROABS 5.7  --   --   --   --   --   --   HGB 11.3*   < > 10.2* 10.2* 10.8* 10.5* 10.3*  HCT 35.6*   < > 31.8* 30.7* 32.6* 32.0* 30.3*  MCV 98.1   < > 95.5 93.9 93.4 93.0 92.7  PLT 170   < > 155 152 174  158 156   < > = values in this interval not displayed.   Cardiac Enzymes: No results for input(s): "CKTOTAL", "CKMB", "CKMBINDEX", "TROPONINI" in the last 168 hours. BNP: Invalid input(s): "POCBNP" CBG: Recent Labs  Lab 10/03/21 1108 10/03/21 1622 10/03/21 2125 10/04/21 0612 10/04/21 1121  GLUCAP 104* 102* 127* 88 123*   D-Dimer No results for input(s): "DDIMER" in the last 72 hours. Hgb A1c No results for input(s): "HGBA1C" in the last 72 hours. Lipid Profile No results for input(s): "CHOL", "HDL", "LDLCALC", "TRIG", "CHOLHDL", "LDLDIRECT" in the last 72 hours. Thyroid function studies No results for input(s): "TSH", "T4TOTAL", "T3FREE", "THYROIDAB" in the last 72 hours.  Invalid input(s): "FREET3" Anemia work up No results for input(s): "VITAMINB12", "FOLATE", "FERRITIN", "TIBC", "IRON", "RETICCTPCT" in the last 72 hours. Microbiology Recent  Results (from the past 240 hour(s))  Urine Culture     Status: Abnormal   Collection Time: 10/02/21  2:10 PM   Specimen: Urine, Clean Catch  Result Value Ref Range Status   Specimen Description URINE, CLEAN CATCH  Final   Special Requests   Final    NONE Performed at [facility], [address] [place], [place] [address]    Culture >=100,000 COLONIES/mL PROTEUS MIRABILIS (A)  Final   Report Status 10/04/2021 FINAL  Final   Organism ID, Bacteria PROTEUS MIRABILIS (A)  Final      Susceptibility   Proteus mirabilis - MIC*    AMPICILLIN <=2 SENSITIVE Sensitive     CEFAZOLIN <=4 SENSITIVE Sensitive     CEFEPIME <=0.12 SENSITIVE Sensitive     CEFTRIAXONE <=0.25 SENSITIVE Sensitive     CIPROFLOXACIN <=0.25 SENSITIVE Sensitive     GENTAMICIN <=1 SENSITIVE Sensitive     IMIPENEM 2 SENSITIVE Sensitive     NITROFURANTOIN 128 RESISTANT Resistant     TRIMETH/SULFA <=20 SENSITIVE Sensitive     AMPICILLIN/SULBACTAM <=2 SENSITIVE Sensitive     PIP/TAZO <=4 SENSITIVE Sensitive     * >=100,000 COLONIES/mL PROTEUS MIRABILIS    Time coordinating discharge: 35 minutes  Signed: Ammara Raj  [facility] 10/04/2021, 1:39 PM

## 2021-10-04 NOTE — Progress Notes (Signed)
Report called to [place] at [facility]. All questions addressed.

## 2021-10-04 NOTE — Progress Notes (Signed)
Progress Note  Patient Name: Gregory [name] Date of Encounter: 10/04/2021  Primary Cardiologist: Larae Grooms, MD  Subjective   No acute events overnight.  Dyspnea much improved.  No complaints other than some soreness of LLE (which is tightly wrapped).  I/O -2520 yesterday  Inpatient Medications    Scheduled Meds:  atorvastatin  20 mg Oral Daily   dapagliflozin propanediol  10 mg Oral Daily   digoxin  0.125 mg Oral Daily   feeding supplement  237 mL Oral BID BM   furosemide  80 mg Intravenous Q12H   insulin aspart  0-5 Units Subcutaneous QHS   insulin aspart  0-6 Units Subcutaneous TID WC   metoprolol succinate  50 mg Oral QHS   phenazopyridine  100 mg Oral TID WC   sodium chloride flush  3 mL Intravenous Q12H   spironolactone  25 mg Oral Daily   tamsulosin  0.4 mg Oral Daily   warfarin  2.5 mg Oral q1600   Warfarin - Pharmacist Dosing Inpatient   Does not apply q1600    cefTRIAXone (ROCEPHIN)  IV 1 g (10/03/21 1134)   PRN Meds: acetaminophen **OR** acetaminophen, mouth rinse, senna-docusate   Vital Signs    Vitals:   10/03/21 0608 10/03/21 1052 10/03/21 1957 10/04/21 0634  BP: 118/66 (!) 103/46 (!) 117/50 (!) 105/47  Pulse: 66 62 68 62  Resp: _0 Temp: 99.4 F (37.4 C) 97.9 F (36.6 C) 98.6 F (37 C) 98.6 F (37 C)  TempSrc: Oral Oral Oral Oral  SpO2: 96% 94% 95% 91%  Weight:    109 kg  Height:        Intake/Output Summary (Last 24 hours) at 10/04/2021 0649 Last data filed at 10/04/2021 0640 Gross per 24 hour  Intake --  Output 850 ml  Net -850 ml   Filed Weights   10/01/21 0331 10/02/21 0355 10/04/21 0634  Weight: 111.3 kg 107.2 kg 109 kg    Telemetry    Predominantly AF VP.  Personally reviewed.  Physical Exam   GEN: No acute distress.   Neck: No JVD. Cardiac: RRR, 2/6 midsystolic murmur.  Respiratory: Nonlabored. Clear to auscultation bilaterally. GI: Protuberant, bowel sounds present. MS:  No significant edema, legs are  wrapped;  warm and well perfused Neuro:  Nonfocal. Psych: Alert and oriented x 3. Normal affect.  Labs    Chemistry Recent Labs  Lab 09/27/21 1952 09/28/21 1202 10/02/21 0418 10/03/21 0730 10/04/21 0204  NA 138   < > 138 136 138  K 5.2*   < > 3.9 3.4* 3.4*  CL 105   < > 96* 94* 92*  CO2 23   < > 33* 34* 34*  GLUCOSE 119*   < > 108* 98 107*  BUN 39*   < > 28* 26* 30*  CREATININE 1.09   < > 1.02 1.12 1.24  CALCIUM 8.8*   < > 8.6* 8.5* 8.3*  PROT 5.7*  --   --   --   --   ALBUMIN 3.3*  --   --   --   --   AST 28  --   --   --   --   ALT 19  --   --   --   --   ALKPHOS 120  --   --   --   --   BILITOT 0.8  --   --   --   --   GFRNONAA >60   < > >  60 >60 58*  ANIONGAP 10   < > _0 < > = values in this interval not displayed.     Hematology Recent Labs  Lab 10/02/21 0418 10/03/21 0730 10/04/21 0204  WBC 8.8 11.2* 9.5  RBC 3.49* 3.44* 3.27*  HGB 10.8* 10.5* 10.3*  HCT 32.6* 32.0* 30.3*  MCV 93.4 93.0 92.7  MCH 30.9 30.5 31.5  MCHC 33.1 32.8 34.0  RDW 16.4* 16.2* 16.1*  PLT 174 158 156    Cardiac Enzymes Recent Labs  Lab 09/27/21 1952 09/27/21 2209  TROPONINIHS 42* 45*    BNP Recent Labs  Lab 09/27/21 1952  BNP 354.5*     Radiology    No results found.  Cardiac Studies   Echocardiogram 09/22/2021:  1. Distal inferolateral and apical hypokinesis. . Left ventricular  ejection fraction, by estimation, is 45%%. The left ventricle has mildly  decreased function. The left ventricular internal cavity size was mildly  dilated.   2. Right ventricular systolic function is severely reduced. The right  ventricular size is moderately enlarged. There is moderately elevated  pulmonary artery systolic pressure.   3. Left atrial size was moderate to severely dilated.   4. Right atrial size was severely dilated.   5. Mitral valve is thickened with severe annular calcifications. Peak and  mean gradient through the valve are 24 and 7 mm Hg respectively.   Consistent with at least moderate to severe MS Mitral regurgitant jet is  directed posterior into LA. Vena  contracta is 0.74; MR volume 55 ml OVerall consistent with severe MR. .  Severe mitral valve regurgitation. Severe mitral annular calcification.   6. S/P TAVR (29 mm Sapien prosthesis, implant date 09/16/19) Leaflets  appear to open well Peak and mean gradients through the valve are 17 and 9  mm Hg respectively AVA (VTI) is 1.8 cm2.      Mild perivalvular AI. No signficant change from echo in July 2022. The  aortic valve has been repaired/replaced. Aortic valve regurgitation is  mild. There is a 29 mm Sapien prosthetic (TAVR) valve present in the  aortic position. Procedure Date:  09/16/19.   7. Aortic dilatation noted. There is mild dilatation of the ascending  aorta, measuring 40 mm.   8. The inferior vena cava is dilated in size with <50% respiratory  variability, suggesting right atrial pressure of 15 mmHg.   Assessment & Plan    Acute on chronic systolic HF (predominantly RV failure): Patient remains stable on current regimen.  Will start losartan 12.57m QPM for added afterload reduction; cont Toprol 55m Farxiga 1059mand spironolactone 81m37mBUN/Cr mildly elevated, will change to PO lasix 80 PO BID.  Start KCL [id number].         Patient with right-sided heart failure presumably from severe mitral valve disease consisting of mixed mitral stenosis and mitral regurgitation.  The patient's echocardiogram demonstrates an RV TAPSE of 1.  He does have severe mitral annular calcification on his TAVR protocol CTA that I reviewed from 2021.  Given the presence of severe RV dysfunction I do not think that the patient would derive much benefit from consideration for advanced transcatheter therapies treating the mitral valve disease such as a off label valve and MAC TAVR or an experimental specifically designed mitral valve replacement.  The patient was recently transitioned to DNR as well.     2. Severe mitral valve disease: Mixed mitral stenosis and mitral regurgitation.  See discussion above.  The  patient is not a candidate for advanced or off label therapies.  3. S/p TAVR with mild PVL:  TAVR valve seems to be functioning well.  Mean gradient 8mHg on recent echo.  4. Complete heart block with Medtronic pacemaker:  Monitor for now; remains paced  5. Permanent atrial fibrillation:  Cont Coumadin given AF (and mitral disease)  6. Code status:  DNR   Signed, AEarly Osmond MD  10/04/2021, 6:49 AM

## 2021-10-04 NOTE — Progress Notes (Signed)
ANTICOAGULATION CONSULT NOTE - Follow Up Consult  Pharmacy Consult for Warfarin Indication: atrial fibrillation  Allergies  Allergen Reactions   Aspirin     visual issues and fatigue   Macrobid [Nitrofurantoin]    Nystatin    Prevnar 13 [Pneumococcal 13-Val Conj Vacc]    Bisoprolol-Hydrochlorothiazide Other (See Comments)    "Severe" arthralgias and myalgias   Nsaids Other (See Comments)    GI upset, Peptic ulcer disease    Patient Measurements: Height: '5\' 10"'$  (177.8 cm) Weight: 109 kg (240 lb 4.8 oz) IBW/kg (Calculated) : 73  Vital Signs: Temp: 98.6 F (37 C) (07/04 0634) Temp Source: Oral (07/04 0634) BP: 105/47 (07/04 0634) Pulse Rate: 62 (07/04 0634)  Labs: Recent Labs    10/02/21 0418 10/03/21 0730 10/04/21 0204  HGB 10.8* 10.5* 10.3*  HCT 32.6* 32.0* 30.3*  PLT 174 158 156  LABPROT 18.4* 19.7* 21.9*  INR 1.6* 1.7* 1.9*  CREATININE 1.02 1.12 1.24     Estimated Creatinine Clearance: 57.8 mL/min (by C-G formula based on SCr of 1.24 mg/dL).   Assessment: 81 yo male presented 09/27/21 pm c/o worsening edema of BUE/BLE with exertional dyspnea, admitted for acute on chronic HF exacerbation. PTA Warfarin for atrial fibrillation continued.   PTA Warfarin regimen: 2.5 mg daily. Last PTA dose 6/26 at bedtime.  INR fell to 1.6 <  goal range on PTA dose warfarin 2.'5mg'$  daily.  Tried to boost dose earlier in stay and patient refused. PharmD discussed with patient and he received higher dose 7/2.  INR rose to 1.9 after 2.5 mg dose on 7/3. CBC stable.   Goal of Therapy:  INR 2-3 Monitor platelets by anticoagulation protocol: Yes   Plan:  Continue warfarin 2.5 mg daily. Daily PT/INR for now.  Thank you for allowing pharmacy to participate in this patient's care.  Reatha Harps, PharmD PGY1 Pharmacy Resident 10/04/2021 8:36 AM Check [email or web address] for unit specific pharmacy number

## 2021-10-04 NOTE — TOC Transition Note (Addendum)
Transition of Care [facility]) - CM/SW Discharge Note   Patient Details  Name: MARKEVION LATTIN MRN: [id number] Date of Birth: Aug 13, 1940  Transition of Care [facility]) CM/SW Contact:  Bethann Berkshire, [place] Phone Number: 10/04/2021, 1:38 PM   Clinical Narrative:     Patient will DC to: [facility] Anticipated DC date: 10/04/21 Family notified: Daughter Sports administrator by: Corey Harold   Per MD patient ready for DC to [facility]. RN, patient, patient's family, and facility notified of DC. Discharge Summary and FL2 sent to facility. RN to call report prior to discharge [phone] Room 111). DC packet on chart. Ambulance transport requested for patient.   CSW will sign off for now as social work intervention is no longer needed. Please consult [place] again if new needs arise.  Final next level of care: Skilled Nursing Facility Barriers to Discharge: No Barriers Identified   Patient Goals and CMS Choice        Discharge Placement              Patient chooses bed at: [facility] Patient to be transferred to facility by: [place] Name of family member notified: Daughters Melanie Patient and family notified of of transfer: 10/04/21  Discharge Plan and Services                                     Social Determinants of Health (SDOH) Interventions Food Insecurity Interventions: Intervention Not Indicated Financial Strain Interventions: Intervention Not Indicated Housing Interventions: Intervention Not Indicated Transportation Interventions: Intervention Not Indicated   Readmission Risk Interventions    09/19/2019   12:12 PM  Readmission Risk Prevention Plan  Transportation Screening Complete  PCP or Specialist Appt within 5-7 Days Complete  Home Care Screening Complete  Medication Review (RN CM) Complete

## 2021-10-05 DIAGNOSIS — Z794 Long term (current) use of insulin: Secondary | ICD-10-CM | POA: Diagnosis not present

## 2021-10-05 DIAGNOSIS — I5033 Acute on chronic diastolic (congestive) heart failure: Secondary | ICD-10-CM | POA: Diagnosis not present

## 2021-10-05 DIAGNOSIS — I059 Rheumatic mitral valve disease, unspecified: Secondary | ICD-10-CM | POA: Diagnosis not present

## 2021-10-05 DIAGNOSIS — D638 Anemia in other chronic diseases classified elsewhere: Secondary | ICD-10-CM | POA: Diagnosis not present

## 2021-10-05 DIAGNOSIS — N3000 Acute cystitis without hematuria: Secondary | ICD-10-CM | POA: Diagnosis not present

## 2021-10-05 DIAGNOSIS — I482 Chronic atrial fibrillation, unspecified: Secondary | ICD-10-CM | POA: Diagnosis not present

## 2021-10-05 DIAGNOSIS — E1159 Type 2 diabetes mellitus with other circulatory complications: Secondary | ICD-10-CM | POA: Diagnosis not present

## 2021-10-05 DIAGNOSIS — R2689 Other abnormalities of gait and mobility: Secondary | ICD-10-CM | POA: Diagnosis not present

## 2021-10-11 DIAGNOSIS — R609 Edema, unspecified: Secondary | ICD-10-CM | POA: Diagnosis not present

## 2021-10-11 DIAGNOSIS — I5032 Chronic diastolic (congestive) heart failure: Secondary | ICD-10-CM | POA: Diagnosis not present

## 2021-10-12 ENCOUNTER — Encounter (HOSPITAL_COMMUNITY): Payer: PPO

## 2021-10-12 IMAGING — CT CT ANGIO CHEST
1 of 4 series · 1 of 29 positions shown · IV contrast (omnipaque)
Comparison: 04/17/2003 chest CT. 08/20/2018 CT abdomen/pelvis.

CLINICAL DATA: Severe symptomatic aortic stenosis. Pre-TAVR
evaluation.

EXAM:
CT ANGIOGRAPHY CHEST, ABDOMEN AND PELVIS
TECHNIQUE: Multidetector CT imaging through the chest, abdomen and pelvis was
performed using the standard protocol during bolus administration of
intravenous contrast. Multiplanar reconstructed images and MIPs were
obtained and reviewed to evaluate the vascular anatomy.
CONTRAST:  100mL OMNIPAQUE IOHEXOL 350 MG/ML SOLN

[Series 626: — · 0.42mm/px · 1 of 7 slices shown]
[im 4/7]
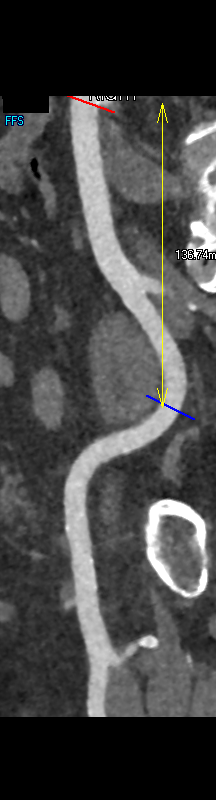

[1 of 29 positions shown; findings below may reference images not displayed]

FINDINGS: CTA CHEST FINDINGS

Cardiovascular: Mild cardiomegaly. Diffuse thickening and coarse
calcification of the aortic valve. No significant pericardial
effusion/thickening. Three-vessel coronary atherosclerosis.
Atherosclerotic nonaneurysmal thoracic aorta. Top-normal caliber
main pulmonary artery (3.4 cm diameter). No central pulmonary
emboli.

Mediastinum/Nodes: No discrete thyroid nodules. Unremarkable
esophagus. No pathologically enlarged axillary, mediastinal or hilar
lymph nodes.

Lungs/Pleura: No pneumothorax. No pleural effusion. No acute
consolidative airspace disease, lung masses or significant pulmonary
nodules.

Musculoskeletal: No aggressive appearing focal osseous lesions.
Marked thoracic spondylosis.

CTA ABDOMEN AND PELVIS FINDINGS

Hepatobiliary: Normal liver with no liver mass. Normal gallbladder
with no radiopaque cholelithiasis. No biliary ductal dilatation.

Pancreas: Low-attenuation 1.3 cm focus in the pancreatic body
(series 15/image 116). No additional potential pancreatic lesions.
No pancreatic duct dilation.

Spleen: Normal size. No mass.

Adrenals/Urinary Tract: Right adrenal 1.1 cm nodule with density 49
HU, stable since 04/17/2003 CT, compatible with a benign adenoma. No
left adrenal nodules. No hydronephrosis. No contour deforming renal
masses. Normal bladder.

Stomach/Bowel: Normal non-distended stomach. Normal caliber small
bowel with no small bowel wall thickening. Appendectomy. Mild
sigmoid diverticulosis, with no large bowel wall thickening or
significant pericolonic fat stranding.

Vascular/Lymphatic: Atherosclerotic nonaneurysmal abdominal aorta.
No pathologically enlarged lymph nodes in the abdomen or pelvis.

Reproductive: Mild prostatomegaly.

Other: No pneumoperitoneum, ascites or focal fluid collection.

Musculoskeletal: No aggressive appearing focal osseous lesions.
Marked lumbar spondylosis.

VASCULAR MEASUREMENTS PERTINENT TO TAVR:

AORTA:

Minimal Aortic Uiameter-1P.1 x 13.2 mm

Severity of Aortic Calcification-moderate

RIGHT PELVIS:

Right Common Iliac Artery -

Minimal Yiameter-A4.7 x 10.7 mm

Tortuosity-mild

Calcification-mild

Right External Iliac Artery -

Minimal Jiameter-J.M x 8.3 mm

Tortuosity-moderate

Calcification-mild

Right Common Femoral Artery -

Minimal Jiameter-J.M x 7.8 mm

Tortuosity-mild

Calcification-mild

LEFT PELVIS:

Left Common Iliac Artery -

Minimal Oiameter-BB.N x 9.6 mm

Tortuosity-mild to moderate

Calcification-mild

Left External Iliac Artery -

Minimal Xiameter-E.U x 8.4 mm

Tortuosity-mild

Calcification-mild

Left Common Femoral Artery -

Minimal 8iameter-X.3 x 9.5 mm

Tortuosity-mild

Calcification-mild

Review of the MIP images confirms the above findings.
IMPRESSION: 1. Vascular findings and measurements pertinent to potential TAVR
procedure, as detailed.
2. Diffuse thickening and coarse calcification of the aortic valve,
compatible with the reported history of severe symptomatic aortic
stenosis.
3. Low-attenuation 1.3 cm focus in the pancreatic body. No biliary
or pancreatic duct dilation. MRI abdomen without and with IV
contrast recommended for further characterization.
4. Mild cardiomegaly.
5. Stable right adrenal adenoma.
6. Mild sigmoid diverticulosis.
7. Mild prostatomegaly.
8. Aortic Atherosclerosis ([id number].E).

## 2021-10-14 ENCOUNTER — Encounter (HOSPITAL_COMMUNITY): Payer: PPO

## 2021-10-21 ENCOUNTER — Encounter (HOSPITAL_COMMUNITY): Payer: PPO

## 2021-10-21 ENCOUNTER — Telehealth (HOSPITAL_COMMUNITY): Payer: Self-pay | Admitting: *Deleted

## 2021-10-21 NOTE — Telephone Encounter (Signed)
Heart Failure Nurse Navigator Progress Note    Unable to leave appointment reminder message for 10 am on 10/21/21.   Earnestine Leys, BSN, Clinical cytogeneticist Only

## 2021-10-29 DIAGNOSIS — I10 Essential (primary) hypertension: Secondary | ICD-10-CM | POA: Diagnosis not present

## 2021-10-31 ENCOUNTER — Encounter (HOSPITAL_COMMUNITY): Payer: PPO

## 2021-11-01 DIAGNOSIS — I10 Essential (primary) hypertension: Secondary | ICD-10-CM | POA: Diagnosis not present

## 2021-11-01 DIAGNOSIS — I5020 Unspecified systolic (congestive) heart failure: Secondary | ICD-10-CM | POA: Diagnosis not present

## 2021-11-07 DIAGNOSIS — I10 Essential (primary) hypertension: Secondary | ICD-10-CM | POA: Diagnosis not present

## 2021-11-07 DIAGNOSIS — D649 Anemia, unspecified: Secondary | ICD-10-CM | POA: Diagnosis not present

## 2021-11-08 DIAGNOSIS — I10 Essential (primary) hypertension: Secondary | ICD-10-CM | POA: Diagnosis not present

## 2021-11-08 DIAGNOSIS — D649 Anemia, unspecified: Secondary | ICD-10-CM | POA: Diagnosis not present

## 2021-11-10 DIAGNOSIS — R319 Hematuria, unspecified: Secondary | ICD-10-CM | POA: Diagnosis not present

## 2021-11-10 DIAGNOSIS — N329 Bladder disorder, unspecified: Secondary | ICD-10-CM | POA: Diagnosis not present

## 2021-11-14 DIAGNOSIS — M1990 Unspecified osteoarthritis, unspecified site: Secondary | ICD-10-CM | POA: Diagnosis not present

## 2021-11-14 DIAGNOSIS — R2689 Other abnormalities of gait and mobility: Secondary | ICD-10-CM | POA: Diagnosis not present

## 2021-11-14 DIAGNOSIS — E669 Obesity, unspecified: Secondary | ICD-10-CM | POA: Diagnosis not present

## 2021-11-14 DIAGNOSIS — I110 Hypertensive heart disease with heart failure: Secondary | ICD-10-CM | POA: Diagnosis not present

## 2021-11-17 DIAGNOSIS — Z79899 Other long term (current) drug therapy: Secondary | ICD-10-CM | POA: Diagnosis not present

## 2021-11-17 DIAGNOSIS — Z7901 Long term (current) use of anticoagulants: Secondary | ICD-10-CM | POA: Diagnosis not present

## 2021-11-21 DIAGNOSIS — E1122 Type 2 diabetes mellitus with diabetic chronic kidney disease: Secondary | ICD-10-CM | POA: Diagnosis not present

## 2021-11-21 DIAGNOSIS — I5022 Chronic systolic (congestive) heart failure: Secondary | ICD-10-CM | POA: Diagnosis not present

## 2021-11-21 DIAGNOSIS — I130 Hypertensive heart and chronic kidney disease with heart failure and stage 1 through stage 4 chronic kidney disease, or unspecified chronic kidney disease: Secondary | ICD-10-CM | POA: Diagnosis not present

## 2021-11-21 DIAGNOSIS — Z7901 Long term (current) use of anticoagulants: Secondary | ICD-10-CM | POA: Diagnosis not present

## 2021-11-21 DIAGNOSIS — N182 Chronic kidney disease, stage 2 (mild): Secondary | ICD-10-CM | POA: Diagnosis not present

## 2021-11-21 DIAGNOSIS — E782 Mixed hyperlipidemia: Secondary | ICD-10-CM | POA: Diagnosis not present

## 2021-11-21 DIAGNOSIS — I4821 Permanent atrial fibrillation: Secondary | ICD-10-CM | POA: Diagnosis not present

## 2021-11-25 DIAGNOSIS — D631 Anemia in chronic kidney disease: Secondary | ICD-10-CM | POA: Diagnosis not present

## 2021-11-25 DIAGNOSIS — N400 Enlarged prostate without lower urinary tract symptoms: Secondary | ICD-10-CM | POA: Diagnosis not present

## 2021-11-25 DIAGNOSIS — M1990 Unspecified osteoarthritis, unspecified site: Secondary | ICD-10-CM | POA: Diagnosis not present

## 2021-11-25 DIAGNOSIS — Z7984 Long term (current) use of oral hypoglycemic drugs: Secondary | ICD-10-CM | POA: Diagnosis not present

## 2021-11-25 DIAGNOSIS — S81801D Unspecified open wound, right lower leg, subsequent encounter: Secondary | ICD-10-CM | POA: Diagnosis not present

## 2021-11-25 DIAGNOSIS — Z9181 History of falling: Secondary | ICD-10-CM | POA: Diagnosis not present

## 2021-11-25 DIAGNOSIS — I442 Atrioventricular block, complete: Secondary | ICD-10-CM | POA: Diagnosis not present

## 2021-11-25 DIAGNOSIS — Z7901 Long term (current) use of anticoagulants: Secondary | ICD-10-CM | POA: Diagnosis not present

## 2021-11-25 DIAGNOSIS — Z952 Presence of prosthetic heart valve: Secondary | ICD-10-CM | POA: Diagnosis not present

## 2021-11-25 DIAGNOSIS — E1122 Type 2 diabetes mellitus with diabetic chronic kidney disease: Secondary | ICD-10-CM | POA: Diagnosis not present

## 2021-11-25 DIAGNOSIS — C8590 Non-Hodgkin lymphoma, unspecified, unspecified site: Secondary | ICD-10-CM | POA: Diagnosis not present

## 2021-11-25 DIAGNOSIS — K279 Peptic ulcer, site unspecified, unspecified as acute or chronic, without hemorrhage or perforation: Secondary | ICD-10-CM | POA: Diagnosis not present

## 2021-11-25 DIAGNOSIS — I050 Rheumatic mitral stenosis: Secondary | ICD-10-CM | POA: Diagnosis not present

## 2021-11-25 DIAGNOSIS — I50810 Right heart failure, unspecified: Secondary | ICD-10-CM | POA: Diagnosis not present

## 2021-11-25 DIAGNOSIS — Z950 Presence of cardiac pacemaker: Secondary | ICD-10-CM | POA: Diagnosis not present

## 2021-11-25 DIAGNOSIS — G4733 Obstructive sleep apnea (adult) (pediatric): Secondary | ICD-10-CM | POA: Diagnosis not present

## 2021-11-25 DIAGNOSIS — I5043 Acute on chronic combined systolic (congestive) and diastolic (congestive) heart failure: Secondary | ICD-10-CM | POA: Diagnosis not present

## 2021-11-25 DIAGNOSIS — I130 Hypertensive heart and chronic kidney disease with heart failure and stage 1 through stage 4 chronic kidney disease, or unspecified chronic kidney disease: Secondary | ICD-10-CM | POA: Diagnosis not present

## 2021-11-25 DIAGNOSIS — N182 Chronic kidney disease, stage 2 (mild): Secondary | ICD-10-CM | POA: Diagnosis not present

## 2021-11-25 DIAGNOSIS — I4821 Permanent atrial fibrillation: Secondary | ICD-10-CM | POA: Diagnosis not present

## 2021-11-25 DIAGNOSIS — E785 Hyperlipidemia, unspecified: Secondary | ICD-10-CM | POA: Diagnosis not present

## 2021-11-25 DIAGNOSIS — Z87442 Personal history of urinary calculi: Secondary | ICD-10-CM | POA: Diagnosis not present

## 2021-11-29 ENCOUNTER — Ambulatory Visit: Payer: PPO | Admitting: Interventional Cardiology

## 2021-12-01 DIAGNOSIS — Z7901 Long term (current) use of anticoagulants: Secondary | ICD-10-CM | POA: Diagnosis not present

## 2021-12-08 DIAGNOSIS — G4733 Obstructive sleep apnea (adult) (pediatric): Secondary | ICD-10-CM | POA: Diagnosis not present

## 2021-12-08 DIAGNOSIS — D631 Anemia in chronic kidney disease: Secondary | ICD-10-CM | POA: Diagnosis not present

## 2021-12-08 DIAGNOSIS — Z9181 History of falling: Secondary | ICD-10-CM | POA: Diagnosis not present

## 2021-12-08 DIAGNOSIS — I050 Rheumatic mitral stenosis: Secondary | ICD-10-CM | POA: Diagnosis not present

## 2021-12-08 DIAGNOSIS — I130 Hypertensive heart and chronic kidney disease with heart failure and stage 1 through stage 4 chronic kidney disease, or unspecified chronic kidney disease: Secondary | ICD-10-CM | POA: Diagnosis not present

## 2021-12-08 DIAGNOSIS — Z7901 Long term (current) use of anticoagulants: Secondary | ICD-10-CM | POA: Diagnosis not present

## 2021-12-08 DIAGNOSIS — M1990 Unspecified osteoarthritis, unspecified site: Secondary | ICD-10-CM | POA: Diagnosis not present

## 2021-12-08 DIAGNOSIS — C8590 Non-Hodgkin lymphoma, unspecified, unspecified site: Secondary | ICD-10-CM | POA: Diagnosis not present

## 2021-12-08 DIAGNOSIS — Z87442 Personal history of urinary calculi: Secondary | ICD-10-CM | POA: Diagnosis not present

## 2021-12-08 DIAGNOSIS — E785 Hyperlipidemia, unspecified: Secondary | ICD-10-CM | POA: Diagnosis not present

## 2021-12-08 DIAGNOSIS — I5043 Acute on chronic combined systolic (congestive) and diastolic (congestive) heart failure: Secondary | ICD-10-CM | POA: Diagnosis not present

## 2021-12-08 DIAGNOSIS — I442 Atrioventricular block, complete: Secondary | ICD-10-CM | POA: Diagnosis not present

## 2021-12-08 DIAGNOSIS — I4821 Permanent atrial fibrillation: Secondary | ICD-10-CM | POA: Diagnosis not present

## 2021-12-08 DIAGNOSIS — I50810 Right heart failure, unspecified: Secondary | ICD-10-CM | POA: Diagnosis not present

## 2021-12-08 DIAGNOSIS — N182 Chronic kidney disease, stage 2 (mild): Secondary | ICD-10-CM | POA: Diagnosis not present

## 2021-12-08 DIAGNOSIS — K279 Peptic ulcer, site unspecified, unspecified as acute or chronic, without hemorrhage or perforation: Secondary | ICD-10-CM | POA: Diagnosis not present

## 2021-12-08 DIAGNOSIS — N400 Enlarged prostate without lower urinary tract symptoms: Secondary | ICD-10-CM | POA: Diagnosis not present

## 2021-12-08 DIAGNOSIS — Z7984 Long term (current) use of oral hypoglycemic drugs: Secondary | ICD-10-CM | POA: Diagnosis not present

## 2021-12-08 DIAGNOSIS — Z952 Presence of prosthetic heart valve: Secondary | ICD-10-CM | POA: Diagnosis not present

## 2021-12-08 DIAGNOSIS — Z950 Presence of cardiac pacemaker: Secondary | ICD-10-CM | POA: Diagnosis not present

## 2021-12-08 DIAGNOSIS — S81801D Unspecified open wound, right lower leg, subsequent encounter: Secondary | ICD-10-CM | POA: Diagnosis not present

## 2021-12-08 DIAGNOSIS — E1122 Type 2 diabetes mellitus with diabetic chronic kidney disease: Secondary | ICD-10-CM | POA: Diagnosis not present

## 2021-12-14 DIAGNOSIS — Z87442 Personal history of urinary calculi: Secondary | ICD-10-CM | POA: Diagnosis not present

## 2021-12-14 DIAGNOSIS — Z7901 Long term (current) use of anticoagulants: Secondary | ICD-10-CM | POA: Diagnosis not present

## 2021-12-14 DIAGNOSIS — N182 Chronic kidney disease, stage 2 (mild): Secondary | ICD-10-CM | POA: Diagnosis not present

## 2021-12-14 DIAGNOSIS — I5043 Acute on chronic combined systolic (congestive) and diastolic (congestive) heart failure: Secondary | ICD-10-CM | POA: Diagnosis not present

## 2021-12-14 DIAGNOSIS — I4821 Permanent atrial fibrillation: Secondary | ICD-10-CM | POA: Diagnosis not present

## 2021-12-14 DIAGNOSIS — E785 Hyperlipidemia, unspecified: Secondary | ICD-10-CM | POA: Diagnosis not present

## 2021-12-14 DIAGNOSIS — N400 Enlarged prostate without lower urinary tract symptoms: Secondary | ICD-10-CM | POA: Diagnosis not present

## 2021-12-14 DIAGNOSIS — I50810 Right heart failure, unspecified: Secondary | ICD-10-CM | POA: Diagnosis not present

## 2021-12-14 DIAGNOSIS — Z9181 History of falling: Secondary | ICD-10-CM | POA: Diagnosis not present

## 2021-12-14 DIAGNOSIS — I130 Hypertensive heart and chronic kidney disease with heart failure and stage 1 through stage 4 chronic kidney disease, or unspecified chronic kidney disease: Secondary | ICD-10-CM | POA: Diagnosis not present

## 2021-12-14 DIAGNOSIS — I050 Rheumatic mitral stenosis: Secondary | ICD-10-CM | POA: Diagnosis not present

## 2021-12-14 DIAGNOSIS — S81801D Unspecified open wound, right lower leg, subsequent encounter: Secondary | ICD-10-CM | POA: Diagnosis not present

## 2021-12-14 DIAGNOSIS — C8590 Non-Hodgkin lymphoma, unspecified, unspecified site: Secondary | ICD-10-CM | POA: Diagnosis not present

## 2021-12-14 DIAGNOSIS — G4733 Obstructive sleep apnea (adult) (pediatric): Secondary | ICD-10-CM | POA: Diagnosis not present

## 2021-12-14 DIAGNOSIS — K279 Peptic ulcer, site unspecified, unspecified as acute or chronic, without hemorrhage or perforation: Secondary | ICD-10-CM | POA: Diagnosis not present

## 2021-12-14 DIAGNOSIS — E1122 Type 2 diabetes mellitus with diabetic chronic kidney disease: Secondary | ICD-10-CM | POA: Diagnosis not present

## 2021-12-14 DIAGNOSIS — M1990 Unspecified osteoarthritis, unspecified site: Secondary | ICD-10-CM | POA: Diagnosis not present

## 2021-12-14 DIAGNOSIS — D631 Anemia in chronic kidney disease: Secondary | ICD-10-CM | POA: Diagnosis not present

## 2021-12-14 DIAGNOSIS — Z952 Presence of prosthetic heart valve: Secondary | ICD-10-CM | POA: Diagnosis not present

## 2021-12-14 DIAGNOSIS — Z950 Presence of cardiac pacemaker: Secondary | ICD-10-CM | POA: Diagnosis not present

## 2021-12-14 DIAGNOSIS — I442 Atrioventricular block, complete: Secondary | ICD-10-CM | POA: Diagnosis not present

## 2021-12-14 DIAGNOSIS — Z7984 Long term (current) use of oral hypoglycemic drugs: Secondary | ICD-10-CM | POA: Diagnosis not present

## 2021-12-15 ENCOUNTER — Ambulatory Visit (INDEPENDENT_AMBULATORY_CARE_PROVIDER_SITE_OTHER): Payer: PPO

## 2021-12-15 DIAGNOSIS — E669 Obesity, unspecified: Secondary | ICD-10-CM | POA: Diagnosis not present

## 2021-12-15 DIAGNOSIS — I442 Atrioventricular block, complete: Secondary | ICD-10-CM | POA: Diagnosis not present

## 2021-12-15 DIAGNOSIS — R2689 Other abnormalities of gait and mobility: Secondary | ICD-10-CM | POA: Diagnosis not present

## 2021-12-15 DIAGNOSIS — I110 Hypertensive heart disease with heart failure: Secondary | ICD-10-CM | POA: Diagnosis not present

## 2021-12-15 DIAGNOSIS — M1990 Unspecified osteoarthritis, unspecified site: Secondary | ICD-10-CM | POA: Diagnosis not present

## 2021-12-15 LAB — CUP PACEART REMOTE DEVICE CHECK
Battery Remaining Longevity: 116 mo
Battery Voltage: 3.01 V
Brady Statistic RV Percent Paced: 99.87 %
Date Time Interrogation Session: 20230913221040
Implantable Lead Implant Date: 20210616
Implantable Lead Location: 753860
Implantable Lead Model: 5076
Implantable Pulse Generator Implant Date: 20210616
Lead Channel Impedance Value: 437 Ohm
Lead Channel Impedance Value: 494 Ohm
Lead Channel Pacing Threshold Amplitude: 0.5 V
Lead Channel Pacing Threshold Pulse Width: 0.4 ms
Lead Channel Sensing Intrinsic Amplitude: 6.625 mV
Lead Channel Sensing Intrinsic Amplitude: 6.625 mV
Lead Channel Setting Pacing Amplitude: 2.5 V
Lead Channel Setting Pacing Pulse Width: 0.4 ms
Lead Channel Setting Sensing Sensitivity: 4 mV

## 2021-12-16 DIAGNOSIS — S81801D Unspecified open wound, right lower leg, subsequent encounter: Secondary | ICD-10-CM | POA: Diagnosis not present

## 2021-12-16 DIAGNOSIS — I5043 Acute on chronic combined systolic (congestive) and diastolic (congestive) heart failure: Secondary | ICD-10-CM | POA: Diagnosis not present

## 2021-12-16 DIAGNOSIS — I050 Rheumatic mitral stenosis: Secondary | ICD-10-CM | POA: Diagnosis not present

## 2021-12-16 DIAGNOSIS — E1122 Type 2 diabetes mellitus with diabetic chronic kidney disease: Secondary | ICD-10-CM | POA: Diagnosis not present

## 2021-12-16 DIAGNOSIS — I442 Atrioventricular block, complete: Secondary | ICD-10-CM | POA: Diagnosis not present

## 2021-12-16 DIAGNOSIS — Z952 Presence of prosthetic heart valve: Secondary | ICD-10-CM | POA: Diagnosis not present

## 2021-12-16 DIAGNOSIS — E785 Hyperlipidemia, unspecified: Secondary | ICD-10-CM | POA: Diagnosis not present

## 2021-12-16 DIAGNOSIS — K279 Peptic ulcer, site unspecified, unspecified as acute or chronic, without hemorrhage or perforation: Secondary | ICD-10-CM | POA: Diagnosis not present

## 2021-12-16 DIAGNOSIS — D631 Anemia in chronic kidney disease: Secondary | ICD-10-CM | POA: Diagnosis not present

## 2021-12-16 DIAGNOSIS — N182 Chronic kidney disease, stage 2 (mild): Secondary | ICD-10-CM | POA: Diagnosis not present

## 2021-12-16 DIAGNOSIS — Z7901 Long term (current) use of anticoagulants: Secondary | ICD-10-CM | POA: Diagnosis not present

## 2021-12-16 DIAGNOSIS — G4733 Obstructive sleep apnea (adult) (pediatric): Secondary | ICD-10-CM | POA: Diagnosis not present

## 2021-12-16 DIAGNOSIS — C8590 Non-Hodgkin lymphoma, unspecified, unspecified site: Secondary | ICD-10-CM | POA: Diagnosis not present

## 2021-12-16 DIAGNOSIS — N400 Enlarged prostate without lower urinary tract symptoms: Secondary | ICD-10-CM | POA: Diagnosis not present

## 2021-12-16 DIAGNOSIS — Z7984 Long term (current) use of oral hypoglycemic drugs: Secondary | ICD-10-CM | POA: Diagnosis not present

## 2021-12-16 DIAGNOSIS — I130 Hypertensive heart and chronic kidney disease with heart failure and stage 1 through stage 4 chronic kidney disease, or unspecified chronic kidney disease: Secondary | ICD-10-CM | POA: Diagnosis not present

## 2021-12-16 DIAGNOSIS — Z87442 Personal history of urinary calculi: Secondary | ICD-10-CM | POA: Diagnosis not present

## 2021-12-16 DIAGNOSIS — I50810 Right heart failure, unspecified: Secondary | ICD-10-CM | POA: Diagnosis not present

## 2021-12-16 DIAGNOSIS — M1990 Unspecified osteoarthritis, unspecified site: Secondary | ICD-10-CM | POA: Diagnosis not present

## 2021-12-16 DIAGNOSIS — I4821 Permanent atrial fibrillation: Secondary | ICD-10-CM | POA: Diagnosis not present

## 2021-12-16 DIAGNOSIS — Z9181 History of falling: Secondary | ICD-10-CM | POA: Diagnosis not present

## 2021-12-16 DIAGNOSIS — Z950 Presence of cardiac pacemaker: Secondary | ICD-10-CM | POA: Diagnosis not present

## 2021-12-19 DIAGNOSIS — K279 Peptic ulcer, site unspecified, unspecified as acute or chronic, without hemorrhage or perforation: Secondary | ICD-10-CM | POA: Diagnosis not present

## 2021-12-19 DIAGNOSIS — Z9181 History of falling: Secondary | ICD-10-CM | POA: Diagnosis not present

## 2021-12-19 DIAGNOSIS — Z952 Presence of prosthetic heart valve: Secondary | ICD-10-CM | POA: Diagnosis not present

## 2021-12-19 DIAGNOSIS — Z7901 Long term (current) use of anticoagulants: Secondary | ICD-10-CM | POA: Diagnosis not present

## 2021-12-19 DIAGNOSIS — Z950 Presence of cardiac pacemaker: Secondary | ICD-10-CM | POA: Diagnosis not present

## 2021-12-19 DIAGNOSIS — M1990 Unspecified osteoarthritis, unspecified site: Secondary | ICD-10-CM | POA: Diagnosis not present

## 2021-12-19 DIAGNOSIS — N400 Enlarged prostate without lower urinary tract symptoms: Secondary | ICD-10-CM | POA: Diagnosis not present

## 2021-12-19 DIAGNOSIS — S81801D Unspecified open wound, right lower leg, subsequent encounter: Secondary | ICD-10-CM | POA: Diagnosis not present

## 2021-12-19 DIAGNOSIS — N182 Chronic kidney disease, stage 2 (mild): Secondary | ICD-10-CM | POA: Diagnosis not present

## 2021-12-19 DIAGNOSIS — C8590 Non-Hodgkin lymphoma, unspecified, unspecified site: Secondary | ICD-10-CM | POA: Diagnosis not present

## 2021-12-19 DIAGNOSIS — Z7984 Long term (current) use of oral hypoglycemic drugs: Secondary | ICD-10-CM | POA: Diagnosis not present

## 2021-12-19 DIAGNOSIS — I442 Atrioventricular block, complete: Secondary | ICD-10-CM | POA: Diagnosis not present

## 2021-12-19 DIAGNOSIS — I050 Rheumatic mitral stenosis: Secondary | ICD-10-CM | POA: Diagnosis not present

## 2021-12-19 DIAGNOSIS — I130 Hypertensive heart and chronic kidney disease with heart failure and stage 1 through stage 4 chronic kidney disease, or unspecified chronic kidney disease: Secondary | ICD-10-CM | POA: Diagnosis not present

## 2021-12-19 DIAGNOSIS — Z87442 Personal history of urinary calculi: Secondary | ICD-10-CM | POA: Diagnosis not present

## 2021-12-19 DIAGNOSIS — I4821 Permanent atrial fibrillation: Secondary | ICD-10-CM | POA: Diagnosis not present

## 2021-12-19 DIAGNOSIS — D631 Anemia in chronic kidney disease: Secondary | ICD-10-CM | POA: Diagnosis not present

## 2021-12-19 DIAGNOSIS — I50810 Right heart failure, unspecified: Secondary | ICD-10-CM | POA: Diagnosis not present

## 2021-12-19 DIAGNOSIS — E785 Hyperlipidemia, unspecified: Secondary | ICD-10-CM | POA: Diagnosis not present

## 2021-12-19 DIAGNOSIS — I5043 Acute on chronic combined systolic (congestive) and diastolic (congestive) heart failure: Secondary | ICD-10-CM | POA: Diagnosis not present

## 2021-12-19 DIAGNOSIS — I5022 Chronic systolic (congestive) heart failure: Secondary | ICD-10-CM | POA: Diagnosis not present

## 2021-12-19 DIAGNOSIS — E1122 Type 2 diabetes mellitus with diabetic chronic kidney disease: Secondary | ICD-10-CM | POA: Diagnosis not present

## 2021-12-19 DIAGNOSIS — G4733 Obstructive sleep apnea (adult) (pediatric): Secondary | ICD-10-CM | POA: Diagnosis not present

## 2021-12-22 DIAGNOSIS — E1122 Type 2 diabetes mellitus with diabetic chronic kidney disease: Secondary | ICD-10-CM | POA: Diagnosis not present

## 2021-12-22 DIAGNOSIS — Z952 Presence of prosthetic heart valve: Secondary | ICD-10-CM | POA: Diagnosis not present

## 2021-12-22 DIAGNOSIS — Z9181 History of falling: Secondary | ICD-10-CM | POA: Diagnosis not present

## 2021-12-22 DIAGNOSIS — I5043 Acute on chronic combined systolic (congestive) and diastolic (congestive) heart failure: Secondary | ICD-10-CM | POA: Diagnosis not present

## 2021-12-22 DIAGNOSIS — E785 Hyperlipidemia, unspecified: Secondary | ICD-10-CM | POA: Diagnosis not present

## 2021-12-22 DIAGNOSIS — I50810 Right heart failure, unspecified: Secondary | ICD-10-CM | POA: Diagnosis not present

## 2021-12-22 DIAGNOSIS — K279 Peptic ulcer, site unspecified, unspecified as acute or chronic, without hemorrhage or perforation: Secondary | ICD-10-CM | POA: Diagnosis not present

## 2021-12-22 DIAGNOSIS — I442 Atrioventricular block, complete: Secondary | ICD-10-CM | POA: Diagnosis not present

## 2021-12-22 DIAGNOSIS — C8590 Non-Hodgkin lymphoma, unspecified, unspecified site: Secondary | ICD-10-CM | POA: Diagnosis not present

## 2021-12-22 DIAGNOSIS — Z87442 Personal history of urinary calculi: Secondary | ICD-10-CM | POA: Diagnosis not present

## 2021-12-22 DIAGNOSIS — G4733 Obstructive sleep apnea (adult) (pediatric): Secondary | ICD-10-CM | POA: Diagnosis not present

## 2021-12-22 DIAGNOSIS — N182 Chronic kidney disease, stage 2 (mild): Secondary | ICD-10-CM | POA: Diagnosis not present

## 2021-12-22 DIAGNOSIS — I050 Rheumatic mitral stenosis: Secondary | ICD-10-CM | POA: Diagnosis not present

## 2021-12-22 DIAGNOSIS — M1990 Unspecified osteoarthritis, unspecified site: Secondary | ICD-10-CM | POA: Diagnosis not present

## 2021-12-22 DIAGNOSIS — N400 Enlarged prostate without lower urinary tract symptoms: Secondary | ICD-10-CM | POA: Diagnosis not present

## 2021-12-22 DIAGNOSIS — I130 Hypertensive heart and chronic kidney disease with heart failure and stage 1 through stage 4 chronic kidney disease, or unspecified chronic kidney disease: Secondary | ICD-10-CM | POA: Diagnosis not present

## 2021-12-22 DIAGNOSIS — S81801D Unspecified open wound, right lower leg, subsequent encounter: Secondary | ICD-10-CM | POA: Diagnosis not present

## 2021-12-22 DIAGNOSIS — D631 Anemia in chronic kidney disease: Secondary | ICD-10-CM | POA: Diagnosis not present

## 2021-12-22 DIAGNOSIS — I4821 Permanent atrial fibrillation: Secondary | ICD-10-CM | POA: Diagnosis not present

## 2021-12-22 DIAGNOSIS — Z7984 Long term (current) use of oral hypoglycemic drugs: Secondary | ICD-10-CM | POA: Diagnosis not present

## 2021-12-22 DIAGNOSIS — Z950 Presence of cardiac pacemaker: Secondary | ICD-10-CM | POA: Diagnosis not present

## 2021-12-22 DIAGNOSIS — Z7901 Long term (current) use of anticoagulants: Secondary | ICD-10-CM | POA: Diagnosis not present

## 2021-12-27 DIAGNOSIS — Z87442 Personal history of urinary calculi: Secondary | ICD-10-CM | POA: Diagnosis not present

## 2021-12-27 DIAGNOSIS — S81801D Unspecified open wound, right lower leg, subsequent encounter: Secondary | ICD-10-CM | POA: Diagnosis not present

## 2021-12-27 DIAGNOSIS — D631 Anemia in chronic kidney disease: Secondary | ICD-10-CM | POA: Diagnosis not present

## 2021-12-27 DIAGNOSIS — I50810 Right heart failure, unspecified: Secondary | ICD-10-CM | POA: Diagnosis not present

## 2021-12-27 DIAGNOSIS — I4821 Permanent atrial fibrillation: Secondary | ICD-10-CM | POA: Diagnosis not present

## 2021-12-27 DIAGNOSIS — Z952 Presence of prosthetic heart valve: Secondary | ICD-10-CM | POA: Diagnosis not present

## 2021-12-27 DIAGNOSIS — Z7984 Long term (current) use of oral hypoglycemic drugs: Secondary | ICD-10-CM | POA: Diagnosis not present

## 2021-12-27 DIAGNOSIS — Z7901 Long term (current) use of anticoagulants: Secondary | ICD-10-CM | POA: Diagnosis not present

## 2021-12-27 DIAGNOSIS — Z950 Presence of cardiac pacemaker: Secondary | ICD-10-CM | POA: Diagnosis not present

## 2021-12-27 DIAGNOSIS — C8590 Non-Hodgkin lymphoma, unspecified, unspecified site: Secondary | ICD-10-CM | POA: Diagnosis not present

## 2021-12-27 DIAGNOSIS — I5043 Acute on chronic combined systolic (congestive) and diastolic (congestive) heart failure: Secondary | ICD-10-CM | POA: Diagnosis not present

## 2021-12-27 DIAGNOSIS — I442 Atrioventricular block, complete: Secondary | ICD-10-CM | POA: Diagnosis not present

## 2021-12-27 DIAGNOSIS — E1122 Type 2 diabetes mellitus with diabetic chronic kidney disease: Secondary | ICD-10-CM | POA: Diagnosis not present

## 2021-12-27 DIAGNOSIS — I119 Hypertensive heart disease without heart failure: Secondary | ICD-10-CM | POA: Diagnosis not present

## 2021-12-27 DIAGNOSIS — E782 Mixed hyperlipidemia: Secondary | ICD-10-CM | POA: Diagnosis not present

## 2021-12-27 DIAGNOSIS — M1990 Unspecified osteoarthritis, unspecified site: Secondary | ICD-10-CM | POA: Diagnosis not present

## 2021-12-27 DIAGNOSIS — K279 Peptic ulcer, site unspecified, unspecified as acute or chronic, without hemorrhage or perforation: Secondary | ICD-10-CM | POA: Diagnosis not present

## 2021-12-27 DIAGNOSIS — N182 Chronic kidney disease, stage 2 (mild): Secondary | ICD-10-CM | POA: Diagnosis not present

## 2021-12-27 DIAGNOSIS — I050 Rheumatic mitral stenosis: Secondary | ICD-10-CM | POA: Diagnosis not present

## 2021-12-27 DIAGNOSIS — N400 Enlarged prostate without lower urinary tract symptoms: Secondary | ICD-10-CM | POA: Diagnosis not present

## 2021-12-27 DIAGNOSIS — E785 Hyperlipidemia, unspecified: Secondary | ICD-10-CM | POA: Diagnosis not present

## 2021-12-27 DIAGNOSIS — G4733 Obstructive sleep apnea (adult) (pediatric): Secondary | ICD-10-CM | POA: Diagnosis not present

## 2021-12-27 DIAGNOSIS — I130 Hypertensive heart and chronic kidney disease with heart failure and stage 1 through stage 4 chronic kidney disease, or unspecified chronic kidney disease: Secondary | ICD-10-CM | POA: Diagnosis not present

## 2021-12-27 DIAGNOSIS — Z9181 History of falling: Secondary | ICD-10-CM | POA: Diagnosis not present

## 2021-12-28 DIAGNOSIS — I130 Hypertensive heart and chronic kidney disease with heart failure and stage 1 through stage 4 chronic kidney disease, or unspecified chronic kidney disease: Secondary | ICD-10-CM | POA: Diagnosis not present

## 2021-12-28 DIAGNOSIS — C8590 Non-Hodgkin lymphoma, unspecified, unspecified site: Secondary | ICD-10-CM | POA: Diagnosis not present

## 2021-12-28 DIAGNOSIS — N400 Enlarged prostate without lower urinary tract symptoms: Secondary | ICD-10-CM | POA: Diagnosis not present

## 2021-12-28 DIAGNOSIS — S81801D Unspecified open wound, right lower leg, subsequent encounter: Secondary | ICD-10-CM | POA: Diagnosis not present

## 2021-12-28 DIAGNOSIS — I4821 Permanent atrial fibrillation: Secondary | ICD-10-CM | POA: Diagnosis not present

## 2021-12-28 DIAGNOSIS — N182 Chronic kidney disease, stage 2 (mild): Secondary | ICD-10-CM | POA: Diagnosis not present

## 2021-12-28 DIAGNOSIS — Z950 Presence of cardiac pacemaker: Secondary | ICD-10-CM | POA: Diagnosis not present

## 2021-12-28 DIAGNOSIS — E785 Hyperlipidemia, unspecified: Secondary | ICD-10-CM | POA: Diagnosis not present

## 2021-12-28 DIAGNOSIS — E1122 Type 2 diabetes mellitus with diabetic chronic kidney disease: Secondary | ICD-10-CM | POA: Diagnosis not present

## 2021-12-28 DIAGNOSIS — Z952 Presence of prosthetic heart valve: Secondary | ICD-10-CM | POA: Diagnosis not present

## 2021-12-28 DIAGNOSIS — Z7901 Long term (current) use of anticoagulants: Secondary | ICD-10-CM | POA: Diagnosis not present

## 2021-12-28 DIAGNOSIS — Z7984 Long term (current) use of oral hypoglycemic drugs: Secondary | ICD-10-CM | POA: Diagnosis not present

## 2021-12-28 DIAGNOSIS — I050 Rheumatic mitral stenosis: Secondary | ICD-10-CM | POA: Diagnosis not present

## 2021-12-28 DIAGNOSIS — I442 Atrioventricular block, complete: Secondary | ICD-10-CM | POA: Diagnosis not present

## 2021-12-28 DIAGNOSIS — I50810 Right heart failure, unspecified: Secondary | ICD-10-CM | POA: Diagnosis not present

## 2021-12-28 DIAGNOSIS — K279 Peptic ulcer, site unspecified, unspecified as acute or chronic, without hemorrhage or perforation: Secondary | ICD-10-CM | POA: Diagnosis not present

## 2021-12-28 DIAGNOSIS — Z87442 Personal history of urinary calculi: Secondary | ICD-10-CM | POA: Diagnosis not present

## 2021-12-28 DIAGNOSIS — D631 Anemia in chronic kidney disease: Secondary | ICD-10-CM | POA: Diagnosis not present

## 2021-12-28 DIAGNOSIS — G4733 Obstructive sleep apnea (adult) (pediatric): Secondary | ICD-10-CM | POA: Diagnosis not present

## 2021-12-28 DIAGNOSIS — Z9181 History of falling: Secondary | ICD-10-CM | POA: Diagnosis not present

## 2021-12-28 DIAGNOSIS — I5043 Acute on chronic combined systolic (congestive) and diastolic (congestive) heart failure: Secondary | ICD-10-CM | POA: Diagnosis not present

## 2021-12-28 DIAGNOSIS — M1990 Unspecified osteoarthritis, unspecified site: Secondary | ICD-10-CM | POA: Diagnosis not present

## 2021-12-28 NOTE — Progress Notes (Signed)
Remote pacemaker transmission.   

## 2022-01-02 DIAGNOSIS — Z950 Presence of cardiac pacemaker: Secondary | ICD-10-CM | POA: Diagnosis not present

## 2022-01-02 DIAGNOSIS — I700 Atherosclerosis of aorta: Secondary | ICD-10-CM | POA: Diagnosis not present

## 2022-01-02 DIAGNOSIS — Z23 Encounter for immunization: Secondary | ICD-10-CM | POA: Diagnosis not present

## 2022-01-02 DIAGNOSIS — I5032 Chronic diastolic (congestive) heart failure: Secondary | ICD-10-CM | POA: Diagnosis not present

## 2022-01-02 DIAGNOSIS — I10 Essential (primary) hypertension: Secondary | ICD-10-CM | POA: Diagnosis not present

## 2022-01-02 DIAGNOSIS — I4821 Permanent atrial fibrillation: Secondary | ICD-10-CM | POA: Diagnosis not present

## 2022-01-02 DIAGNOSIS — Z952 Presence of prosthetic heart valve: Secondary | ICD-10-CM | POA: Diagnosis not present

## 2022-01-02 DIAGNOSIS — E7800 Pure hypercholesterolemia, unspecified: Secondary | ICD-10-CM | POA: Diagnosis not present

## 2022-01-02 DIAGNOSIS — Z6827 Body mass index (BMI) 27.0-27.9, adult: Secondary | ICD-10-CM | POA: Diagnosis not present

## 2022-01-02 DIAGNOSIS — Z9989 Dependence on other enabling machines and devices: Secondary | ICD-10-CM | POA: Diagnosis not present

## 2022-01-02 DIAGNOSIS — R634 Abnormal weight loss: Secondary | ICD-10-CM | POA: Diagnosis not present

## 2022-01-02 DIAGNOSIS — E1140 Type 2 diabetes mellitus with diabetic neuropathy, unspecified: Secondary | ICD-10-CM | POA: Diagnosis not present

## 2022-01-04 DIAGNOSIS — S81801D Unspecified open wound, right lower leg, subsequent encounter: Secondary | ICD-10-CM | POA: Diagnosis not present

## 2022-01-04 DIAGNOSIS — G4733 Obstructive sleep apnea (adult) (pediatric): Secondary | ICD-10-CM | POA: Diagnosis not present

## 2022-01-04 DIAGNOSIS — Z7901 Long term (current) use of anticoagulants: Secondary | ICD-10-CM | POA: Diagnosis not present

## 2022-01-04 DIAGNOSIS — Z9181 History of falling: Secondary | ICD-10-CM | POA: Diagnosis not present

## 2022-01-04 DIAGNOSIS — I442 Atrioventricular block, complete: Secondary | ICD-10-CM | POA: Diagnosis not present

## 2022-01-04 DIAGNOSIS — I050 Rheumatic mitral stenosis: Secondary | ICD-10-CM | POA: Diagnosis not present

## 2022-01-04 DIAGNOSIS — E1122 Type 2 diabetes mellitus with diabetic chronic kidney disease: Secondary | ICD-10-CM | POA: Diagnosis not present

## 2022-01-04 DIAGNOSIS — Z7984 Long term (current) use of oral hypoglycemic drugs: Secondary | ICD-10-CM | POA: Diagnosis not present

## 2022-01-04 DIAGNOSIS — N182 Chronic kidney disease, stage 2 (mild): Secondary | ICD-10-CM | POA: Diagnosis not present

## 2022-01-04 DIAGNOSIS — N400 Enlarged prostate without lower urinary tract symptoms: Secondary | ICD-10-CM | POA: Diagnosis not present

## 2022-01-04 DIAGNOSIS — Z952 Presence of prosthetic heart valve: Secondary | ICD-10-CM | POA: Diagnosis not present

## 2022-01-04 DIAGNOSIS — E785 Hyperlipidemia, unspecified: Secondary | ICD-10-CM | POA: Diagnosis not present

## 2022-01-04 DIAGNOSIS — I4821 Permanent atrial fibrillation: Secondary | ICD-10-CM | POA: Diagnosis not present

## 2022-01-04 DIAGNOSIS — C8590 Non-Hodgkin lymphoma, unspecified, unspecified site: Secondary | ICD-10-CM | POA: Diagnosis not present

## 2022-01-04 DIAGNOSIS — I50810 Right heart failure, unspecified: Secondary | ICD-10-CM | POA: Diagnosis not present

## 2022-01-04 DIAGNOSIS — I5043 Acute on chronic combined systolic (congestive) and diastolic (congestive) heart failure: Secondary | ICD-10-CM | POA: Diagnosis not present

## 2022-01-04 DIAGNOSIS — Z87442 Personal history of urinary calculi: Secondary | ICD-10-CM | POA: Diagnosis not present

## 2022-01-04 DIAGNOSIS — I130 Hypertensive heart and chronic kidney disease with heart failure and stage 1 through stage 4 chronic kidney disease, or unspecified chronic kidney disease: Secondary | ICD-10-CM | POA: Diagnosis not present

## 2022-01-04 DIAGNOSIS — D631 Anemia in chronic kidney disease: Secondary | ICD-10-CM | POA: Diagnosis not present

## 2022-01-04 DIAGNOSIS — M1990 Unspecified osteoarthritis, unspecified site: Secondary | ICD-10-CM | POA: Diagnosis not present

## 2022-01-04 DIAGNOSIS — Z950 Presence of cardiac pacemaker: Secondary | ICD-10-CM | POA: Diagnosis not present

## 2022-01-04 DIAGNOSIS — K279 Peptic ulcer, site unspecified, unspecified as acute or chronic, without hemorrhage or perforation: Secondary | ICD-10-CM | POA: Diagnosis not present

## 2022-01-06 DIAGNOSIS — Z7984 Long term (current) use of oral hypoglycemic drugs: Secondary | ICD-10-CM | POA: Diagnosis not present

## 2022-01-06 DIAGNOSIS — I130 Hypertensive heart and chronic kidney disease with heart failure and stage 1 through stage 4 chronic kidney disease, or unspecified chronic kidney disease: Secondary | ICD-10-CM | POA: Diagnosis not present

## 2022-01-06 DIAGNOSIS — I442 Atrioventricular block, complete: Secondary | ICD-10-CM | POA: Diagnosis not present

## 2022-01-06 DIAGNOSIS — D631 Anemia in chronic kidney disease: Secondary | ICD-10-CM | POA: Diagnosis not present

## 2022-01-06 DIAGNOSIS — E785 Hyperlipidemia, unspecified: Secondary | ICD-10-CM | POA: Diagnosis not present

## 2022-01-06 DIAGNOSIS — Z87442 Personal history of urinary calculi: Secondary | ICD-10-CM | POA: Diagnosis not present

## 2022-01-06 DIAGNOSIS — K279 Peptic ulcer, site unspecified, unspecified as acute or chronic, without hemorrhage or perforation: Secondary | ICD-10-CM | POA: Diagnosis not present

## 2022-01-06 DIAGNOSIS — N400 Enlarged prostate without lower urinary tract symptoms: Secondary | ICD-10-CM | POA: Diagnosis not present

## 2022-01-06 DIAGNOSIS — Z950 Presence of cardiac pacemaker: Secondary | ICD-10-CM | POA: Diagnosis not present

## 2022-01-06 DIAGNOSIS — I50810 Right heart failure, unspecified: Secondary | ICD-10-CM | POA: Diagnosis not present

## 2022-01-06 DIAGNOSIS — Z9181 History of falling: Secondary | ICD-10-CM | POA: Diagnosis not present

## 2022-01-06 DIAGNOSIS — I4821 Permanent atrial fibrillation: Secondary | ICD-10-CM | POA: Diagnosis not present

## 2022-01-06 DIAGNOSIS — M1990 Unspecified osteoarthritis, unspecified site: Secondary | ICD-10-CM | POA: Diagnosis not present

## 2022-01-06 DIAGNOSIS — Z952 Presence of prosthetic heart valve: Secondary | ICD-10-CM | POA: Diagnosis not present

## 2022-01-06 DIAGNOSIS — N182 Chronic kidney disease, stage 2 (mild): Secondary | ICD-10-CM | POA: Diagnosis not present

## 2022-01-06 DIAGNOSIS — C8590 Non-Hodgkin lymphoma, unspecified, unspecified site: Secondary | ICD-10-CM | POA: Diagnosis not present

## 2022-01-06 DIAGNOSIS — I050 Rheumatic mitral stenosis: Secondary | ICD-10-CM | POA: Diagnosis not present

## 2022-01-06 DIAGNOSIS — S81801D Unspecified open wound, right lower leg, subsequent encounter: Secondary | ICD-10-CM | POA: Diagnosis not present

## 2022-01-06 DIAGNOSIS — I5043 Acute on chronic combined systolic (congestive) and diastolic (congestive) heart failure: Secondary | ICD-10-CM | POA: Diagnosis not present

## 2022-01-06 DIAGNOSIS — G4733 Obstructive sleep apnea (adult) (pediatric): Secondary | ICD-10-CM | POA: Diagnosis not present

## 2022-01-06 DIAGNOSIS — E1122 Type 2 diabetes mellitus with diabetic chronic kidney disease: Secondary | ICD-10-CM | POA: Diagnosis not present

## 2022-01-06 DIAGNOSIS — Z7901 Long term (current) use of anticoagulants: Secondary | ICD-10-CM | POA: Diagnosis not present

## 2022-01-09 DIAGNOSIS — Z950 Presence of cardiac pacemaker: Secondary | ICD-10-CM | POA: Diagnosis not present

## 2022-01-09 DIAGNOSIS — Z9181 History of falling: Secondary | ICD-10-CM | POA: Diagnosis not present

## 2022-01-09 DIAGNOSIS — I050 Rheumatic mitral stenosis: Secondary | ICD-10-CM | POA: Diagnosis not present

## 2022-01-09 DIAGNOSIS — E785 Hyperlipidemia, unspecified: Secondary | ICD-10-CM | POA: Diagnosis not present

## 2022-01-09 DIAGNOSIS — Z7984 Long term (current) use of oral hypoglycemic drugs: Secondary | ICD-10-CM | POA: Diagnosis not present

## 2022-01-09 DIAGNOSIS — Z7901 Long term (current) use of anticoagulants: Secondary | ICD-10-CM | POA: Diagnosis not present

## 2022-01-09 DIAGNOSIS — E1122 Type 2 diabetes mellitus with diabetic chronic kidney disease: Secondary | ICD-10-CM | POA: Diagnosis not present

## 2022-01-09 DIAGNOSIS — I442 Atrioventricular block, complete: Secondary | ICD-10-CM | POA: Diagnosis not present

## 2022-01-09 DIAGNOSIS — N400 Enlarged prostate without lower urinary tract symptoms: Secondary | ICD-10-CM | POA: Diagnosis not present

## 2022-01-09 DIAGNOSIS — I50810 Right heart failure, unspecified: Secondary | ICD-10-CM | POA: Diagnosis not present

## 2022-01-09 DIAGNOSIS — Z952 Presence of prosthetic heart valve: Secondary | ICD-10-CM | POA: Diagnosis not present

## 2022-01-09 DIAGNOSIS — I130 Hypertensive heart and chronic kidney disease with heart failure and stage 1 through stage 4 chronic kidney disease, or unspecified chronic kidney disease: Secondary | ICD-10-CM | POA: Diagnosis not present

## 2022-01-09 DIAGNOSIS — I5043 Acute on chronic combined systolic (congestive) and diastolic (congestive) heart failure: Secondary | ICD-10-CM | POA: Diagnosis not present

## 2022-01-09 DIAGNOSIS — M1990 Unspecified osteoarthritis, unspecified site: Secondary | ICD-10-CM | POA: Diagnosis not present

## 2022-01-09 DIAGNOSIS — G4733 Obstructive sleep apnea (adult) (pediatric): Secondary | ICD-10-CM | POA: Diagnosis not present

## 2022-01-09 DIAGNOSIS — K279 Peptic ulcer, site unspecified, unspecified as acute or chronic, without hemorrhage or perforation: Secondary | ICD-10-CM | POA: Diagnosis not present

## 2022-01-09 DIAGNOSIS — C8590 Non-Hodgkin lymphoma, unspecified, unspecified site: Secondary | ICD-10-CM | POA: Diagnosis not present

## 2022-01-09 DIAGNOSIS — N182 Chronic kidney disease, stage 2 (mild): Secondary | ICD-10-CM | POA: Diagnosis not present

## 2022-01-09 DIAGNOSIS — S81801D Unspecified open wound, right lower leg, subsequent encounter: Secondary | ICD-10-CM | POA: Diagnosis not present

## 2022-01-09 DIAGNOSIS — Z87442 Personal history of urinary calculi: Secondary | ICD-10-CM | POA: Diagnosis not present

## 2022-01-09 DIAGNOSIS — I4821 Permanent atrial fibrillation: Secondary | ICD-10-CM | POA: Diagnosis not present

## 2022-01-09 DIAGNOSIS — D631 Anemia in chronic kidney disease: Secondary | ICD-10-CM | POA: Diagnosis not present

## 2022-01-10 DIAGNOSIS — I10 Essential (primary) hypertension: Secondary | ICD-10-CM | POA: Diagnosis not present

## 2022-01-10 DIAGNOSIS — I4821 Permanent atrial fibrillation: Secondary | ICD-10-CM | POA: Diagnosis not present

## 2022-01-10 DIAGNOSIS — M1711 Unilateral primary osteoarthritis, right knee: Secondary | ICD-10-CM | POA: Diagnosis not present

## 2022-01-10 DIAGNOSIS — I5032 Chronic diastolic (congestive) heart failure: Secondary | ICD-10-CM | POA: Diagnosis not present

## 2022-01-10 DIAGNOSIS — E7800 Pure hypercholesterolemia, unspecified: Secondary | ICD-10-CM | POA: Diagnosis not present

## 2022-01-10 DIAGNOSIS — N400 Enlarged prostate without lower urinary tract symptoms: Secondary | ICD-10-CM | POA: Diagnosis not present

## 2022-01-10 DIAGNOSIS — E1140 Type 2 diabetes mellitus with diabetic neuropathy, unspecified: Secondary | ICD-10-CM | POA: Diagnosis not present

## 2022-01-11 DIAGNOSIS — D631 Anemia in chronic kidney disease: Secondary | ICD-10-CM | POA: Diagnosis not present

## 2022-01-11 DIAGNOSIS — S81801D Unspecified open wound, right lower leg, subsequent encounter: Secondary | ICD-10-CM | POA: Diagnosis not present

## 2022-01-11 DIAGNOSIS — I5043 Acute on chronic combined systolic (congestive) and diastolic (congestive) heart failure: Secondary | ICD-10-CM | POA: Diagnosis not present

## 2022-01-11 DIAGNOSIS — K279 Peptic ulcer, site unspecified, unspecified as acute or chronic, without hemorrhage or perforation: Secondary | ICD-10-CM | POA: Diagnosis not present

## 2022-01-11 DIAGNOSIS — I130 Hypertensive heart and chronic kidney disease with heart failure and stage 1 through stage 4 chronic kidney disease, or unspecified chronic kidney disease: Secondary | ICD-10-CM | POA: Diagnosis not present

## 2022-01-11 DIAGNOSIS — C8590 Non-Hodgkin lymphoma, unspecified, unspecified site: Secondary | ICD-10-CM | POA: Diagnosis not present

## 2022-01-11 DIAGNOSIS — I442 Atrioventricular block, complete: Secondary | ICD-10-CM | POA: Diagnosis not present

## 2022-01-11 DIAGNOSIS — Z87442 Personal history of urinary calculi: Secondary | ICD-10-CM | POA: Diagnosis not present

## 2022-01-11 DIAGNOSIS — E785 Hyperlipidemia, unspecified: Secondary | ICD-10-CM | POA: Diagnosis not present

## 2022-01-11 DIAGNOSIS — I4821 Permanent atrial fibrillation: Secondary | ICD-10-CM | POA: Diagnosis not present

## 2022-01-11 DIAGNOSIS — Z7984 Long term (current) use of oral hypoglycemic drugs: Secondary | ICD-10-CM | POA: Diagnosis not present

## 2022-01-11 DIAGNOSIS — G4733 Obstructive sleep apnea (adult) (pediatric): Secondary | ICD-10-CM | POA: Diagnosis not present

## 2022-01-11 DIAGNOSIS — N182 Chronic kidney disease, stage 2 (mild): Secondary | ICD-10-CM | POA: Diagnosis not present

## 2022-01-11 DIAGNOSIS — Z9181 History of falling: Secondary | ICD-10-CM | POA: Diagnosis not present

## 2022-01-11 DIAGNOSIS — Z950 Presence of cardiac pacemaker: Secondary | ICD-10-CM | POA: Diagnosis not present

## 2022-01-11 DIAGNOSIS — I50810 Right heart failure, unspecified: Secondary | ICD-10-CM | POA: Diagnosis not present

## 2022-01-11 DIAGNOSIS — Z7901 Long term (current) use of anticoagulants: Secondary | ICD-10-CM | POA: Diagnosis not present

## 2022-01-11 DIAGNOSIS — I050 Rheumatic mitral stenosis: Secondary | ICD-10-CM | POA: Diagnosis not present

## 2022-01-11 DIAGNOSIS — Z952 Presence of prosthetic heart valve: Secondary | ICD-10-CM | POA: Diagnosis not present

## 2022-01-11 DIAGNOSIS — E1122 Type 2 diabetes mellitus with diabetic chronic kidney disease: Secondary | ICD-10-CM | POA: Diagnosis not present

## 2022-01-11 DIAGNOSIS — M1990 Unspecified osteoarthritis, unspecified site: Secondary | ICD-10-CM | POA: Diagnosis not present

## 2022-01-11 DIAGNOSIS — N400 Enlarged prostate without lower urinary tract symptoms: Secondary | ICD-10-CM | POA: Diagnosis not present

## 2022-01-11 NOTE — Progress Notes (Signed)
Cardiology Office Note   Date:  01/13/2022   ID:  Gregory [name], DOB Dec 08, 1940, MRN [id number]  PCP:  Kathyrn Lass, MD    No chief complaint on file.  S/p AVR  Wt Readings from Last 3 Encounters:  01/13/22 194 lb (88 kg)  10/04/21 240 lb 4.8 oz (109 kg)  02/22/21 257 lb 3.2 oz (116.7 kg)       History of Present Illness: Gregory [name] is a 81 y.o. male  with mitral regurgtation and severe AS.    In 07/06/15, had acute heart failure diastolic, asa diuresed.  He is now 40 lbs lighter than he was in 2/17.  20 lbs of fluid diuresed and then 20 lb weight loss.   He reduced salt in his diet.   Walking limited by knee pain.  Has received knee injections.    His sister in law died from cancer in early 2018-07-06.  THis has been a source of stress.    05/2018 echo showed severe AS: Aortic stenosis now severe.  He should watch for chest discomfort, signs of CHF ( DOE, fluid overload), or lightheadedness/syncope./  WOuld need left and right heart cath and then evaluation for SAVR vs. TAVR based on coronary anatomy.     4/21 echo showed: Normal LV function. Calcification of the aortomitral continuity with severe aortic valve  stenosis and moderate mitral valve stesnosis. Findings suggestive of  radiation heart disease.   6. The mitral valve is abnormal. Mild to moderate mitral valve  regurgitation. Moderate mitral stenosis. The mean mitral valve gradient is 6.0 mmHg with average heart rate of 74 bpm.   7. The aortic valve is abnormal. Aortic valve regurgitation is not  visualized. Severe aortic valve stenosis. Aortic valve mean gradient  measures 44.0 mmHg.   8. Aortic dilatation noted. There is mild dilatation of the ascending  aorta measuring 41 mm.    In July 06, 2019, he had cath showing no severe CAD.  He then had TAVR followed by pacemaker in Jul 06, 2019.  Pacer on the right side of chest after there was too much scar tissue on the left side.    Walking limited by right knee pain.      Last hospitalized in July 2023 for heart failure.  Went to rehab.  He has been at home for the past 2 weeks.   Denies : Chest pain. Dizziness. Leg edema. Nitroglycerin use. Orthopnea. Palpitations. Paroxysmal nocturnal dyspnea. Shortness of breath. Syncope.    He was supposed to see CHF clinic, but the driver from rehab did not get him to the visit.    Past Medical History:  Diagnosis Date   Amputation finger    Distal 4th & 5th digits of right hand   Arthritis    Chronic atrial fibrillation (HCC)    Chronic diastolic CHF (congestive heart failure) (HCC)    Diabetes mellitus type 2, noninsulin dependent (HCC)    History of blood transfusion    5 pints for peptic ulcer   History of kidney stones    Passed x 2   Hypertension    Mitral valve disease    mild-moderate MR, moderate MS 07/15/19 echo   OSA (obstructive sleep apnea)    Other malignant lymphomas, unspecified site, extranodal and solid organ sites    Non Hodkins Lymothopathy   Ruptured disk    S/P TAVR (transcatheter aortic valve replacement) 09/16/2019   s/p TAVR with a 29 mm Edwards Sapien 3 THV via the  TF approach by Dr. Burt Knack and Dr. Cyndia Bent   Severe aortic stenosis    Ulcer    peptic Ulcer - bleeding    Past Surgical History:  Procedure Laterality Date   APPENDECTOMY  1967   COLONOSCOPY     left knee surgery     left shoulder surgery     MULTIPLE EXTRACTIONS WITH ALVEOLOPLASTY N/A 09/04/2019   Procedure: Extraction of tooth #'s 2,7,8,9,11,13,14 20,30, and 31 with alveoloplatsty and gross debridement of remaining teeth;  Surgeon: Lenn Cal, DDS;  Location: [facility];  Service: Oral Surgery;  Laterality: N/A;   PACEMAKER IMPLANT N/A 09/17/2019   Procedure: PACEMAKER IMPLANT;  Surgeon: Constance Haw, MD;  Location: [facility];  Service: Cardiovascular;  Laterality: N/A;   RIGHT/LEFT HEART CATH AND CORONARY ANGIOGRAPHY N/A 08/12/2019   Procedure: RIGHT/LEFT HEART CATH AND CORONARY ANGIOGRAPHY;   Surgeon: Jettie Booze, MD;  Location: [facility];  Service: Cardiovascular;  Laterality: N/A;   TEE WITHOUT CARDIOVERSION N/A 09/16/2019   Procedure: TRANSESOPHAGEAL ECHOCARDIOGRAM (TEE);  Surgeon: Sherren Mocha, MD;  Location: [facility];  Service: Open Heart Surgery;  Laterality: N/A;   TONSILLECTOMY     TRANSCATHETER AORTIC VALVE REPLACEMENT, TRANSFEMORAL N/A 09/16/2019   Procedure: TRANSCATHETER AORTIC VALVE REPLACEMENT, TRANSFEMORAL;  Surgeon: Sherren Mocha, MD;  Location: [facility];  Service: Open Heart Surgery;  Laterality: N/A;     Current Outpatient Medications  Medication Sig Dispense Refill   acetaminophen (TYLENOL) 650 MG CR tablet Take 650 mg by mouth every 8 (eight) hours as needed for pain.     atorvastatin (LIPITOR) 20 MG tablet Take 20 mg by mouth daily.     cyanocobalamin 1000 MCG tablet Take 1,000 mcg by mouth daily.     dapagliflozin propanediol (FARXIGA) 10 MG TABS tablet Take 1 tablet (10 mg total) by mouth daily. 30 tablet    feeding supplement (ENSURE ENLIVE / ENSURE PLUS) LIQD Take 237 mLs by mouth 2 (two) times daily between meals. 237 mL 12   furosemide (LASIX) 80 MG tablet Take 1 tablet (80 mg total) by mouth 2 (two) times daily. 30 tablet    gabapentin (NEURONTIN) 300 MG capsule daily.     insulin aspart (NOVOLOG) 100 UNIT/ML injection Inject 0-6 Units into the skin 3 (three) times daily with meals. 10 mL 11   ketoconazole (NIZORAL) 2 % cream Apply 1 Application topically 2 (two) times daily as needed for irritation.     losartan (COZAAR) 25 MG tablet Take 0.5 tablets (12.5 mg total) by mouth at bedtime.     metFORMIN (GLUCOPHAGE-XR) 500 MG 24 hr tablet Take 1 tablet (500 mg total) by mouth 2 (two) times daily. (Patient taking differently: Take 500 mg by mouth daily with breakfast.)     metoprolol succinate (TOPROL-XL) 50 MG 24 hr tablet Take 1 tablet (50 mg total) by mouth at bedtime. Take with or immediately following a meal.      Multiple Vitamins-Minerals (PRESERVISION AREDS 2 PO) Take 1 tablet by mouth daily.     ONE TOUCH ULTRA TEST test strip 1 each by Other route as needed (BLOOD SUGAR).      ONETOUCH DELICA LANCETS 56L MISC Apply 1 each topically daily as needed (BLOOD SUGAR).      potassium chloride SA (KLOR-CON M) 20 MEQ tablet Take 1 tablet (20 mEq total) by mouth daily.     senna-docusate (SENOKOT-S) 8.6-50 MG tablet Take 1 tablet by mouth at bedtime  as needed for mild constipation.     spironolactone (ALDACTONE) 25 MG tablet Take 1 tablet (25 mg total) by mouth daily.     tamsulosin (FLOMAX) 0.4 MG CAPS capsule Take 0.4 mg by mouth daily.     torsemide (DEMADEX) 20 MG tablet 2 tablets daily.     warfarin (COUMADIN) 6 MG tablet Take 6 mg by mouth daily.     No current facility-administered medications for this visit.    Allergies:   Aspirin, Macrobid [nitrofurantoin], Nystatin, Prevnar 13 [pneumococcal 13-val conj vacc], Bisoprolol-hydrochlorothiazide, and Nsaids    Social History:  The patient  reports that he has never smoked. He has never used smokeless tobacco. He reports that he does not drink alcohol and does not use drugs.   Family History:  The patient's family history includes Cancer in his mother; Heart attack in his father and mother; Hypertension in his father.    ROS:  Please see the history of present illness.   Otherwise, review of systems are positive for intentional weight loss, knee pain.   All other systems are reviewed and negative.    PHYSICAL EXAM: VS:  BP (!) 100/58   Pulse 68   Ht '5\' 10"'$  (1.778 m)   Wt 194 lb (88 kg)   BMI 27.84 kg/m  , BMI Body mass index is 27.84 kg/m. GEN: Well nourished, well developed, in no acute distress HEENT: normal Neck: no JVD, carotid bruits, or masses Cardiac: ; RRR, no murmurs, rubs, or gallops,; tr edema  Respiratory:  clear to auscultation bilaterally, normal work of breathing GI: soft, nontender, nondistended, + BS MS: no deformity or  atrophy Skin: warm and dry, no rash Neuro:  Strength and sensation are intact Psych: euthymic mood, full affect   EKG:   The ekg ordered today demonstrates    Recent Labs: 09/27/2021: ALT 19; B Natriuretic Peptide 354.5 09/29/2021: Magnesium 2.0 10/04/2021: BUN 30; Creatinine, Ser 1.24; Hemoglobin 10.3; Platelets 156; Potassium 3.4; Sodium 138   Lipid Panel No results found for: "CHOL", "TRIG", "HDL", "CHOLHDL", "VLDL", "LDLCALC", "LDLDIRECT"   Other studies Reviewed: Additional studies/ records that were reviewed today with results demonstrating: lab results reviewed.   ASSESSMENT AND PLAN:  AS: s/p TAVR.  He needs SBE prophylaxis for dental work.  Considering knee surgery. Will assess risk depending on the surgery, but I suspect he would tolerate it ok.  WOuld have to ensure that he does not receive excessive IV fluids that might put him into heart failure. Mitral stenosis: Has been seen by structural team.  Plan was for conservative therapy unless he had recurrent heart failure.  Last hospitalized in July 2023 for heart failure.  Went to rehab Chronic diastolic heart failure: Appears euvolemic. Now on Torsemide for diuretic.  Labs stable in early 10/23.  While he was in rehab, he had transportation issues and could not get a ride to the heart failure clinic.  On one occasion, they brought him to [facility] and then took him home when they realized it was the wrong place.  His nieces are here today.  Would like to have a heart failure evaluation and see if there would be options for mitral valve intervention at another institution if it became necessary.  For now, continue medical therapy. Hypertensive heart disease: The current medical regimen is effective;  continue present plan and medications. Hyperlipidemia:  LDL 62.  HTN: The current medical regimen is effective;  continue present plan and medications.  Creatinine 1.25.  Potassium  5.0.  Stable.  Will stop potassium supplements since  he is on losartan and spironolactone.   Current medicines are reviewed at length with the patient today.  The patient concerns regarding his medicines were addressed.  The following changes have been made:  No change  Labs/ tests ordered today include:  No orders of the defined types were placed in this encounter.   Recommend 150 minutes/week of aerobic exercise Low fat, low carb, high fiber diet recommended  Disposition:   FU in 6 months   Signed, Larae Grooms, MD  01/13/2022 11:05 AM    [facility] [address], [place], [place]  [address] Phone: [phone]; Fax: [phone]

## 2022-01-13 ENCOUNTER — Encounter: Payer: Self-pay | Admitting: Interventional Cardiology

## 2022-01-13 ENCOUNTER — Ambulatory Visit: Payer: PPO | Attending: Interventional Cardiology | Admitting: Interventional Cardiology

## 2022-01-13 VITALS — BP 100/58 | HR 68 | Ht 70.0 in | Wt 194.0 lb

## 2022-01-13 DIAGNOSIS — I442 Atrioventricular block, complete: Secondary | ICD-10-CM

## 2022-01-13 DIAGNOSIS — I059 Rheumatic mitral valve disease, unspecified: Secondary | ICD-10-CM

## 2022-01-13 DIAGNOSIS — Z950 Presence of cardiac pacemaker: Secondary | ICD-10-CM

## 2022-01-13 DIAGNOSIS — I4819 Other persistent atrial fibrillation: Secondary | ICD-10-CM

## 2022-01-13 DIAGNOSIS — I5032 Chronic diastolic (congestive) heart failure: Secondary | ICD-10-CM | POA: Diagnosis not present

## 2022-01-13 DIAGNOSIS — Z952 Presence of prosthetic heart valve: Secondary | ICD-10-CM | POA: Diagnosis not present

## 2022-01-13 MED ORDER — LOSARTAN POTASSIUM 25 MG PO TABS: 12.5000 mg | ORAL_TABLET | Freq: Every day | ORAL | 3 refills | Status: DC

## 2022-01-13 MED ORDER — TORSEMIDE 20 MG PO TABS: 40.0000 mg | ORAL_TABLET | Freq: Every day | ORAL | 3 refills | Status: DC

## 2022-01-13 MED ORDER — SPIRONOLACTONE 25 MG PO TABS: 25.0000 mg | ORAL_TABLET | Freq: Every day | ORAL | 3 refills | Status: DC

## 2022-01-13 NOTE — Patient Instructions (Addendum)
Medication Instructions:  Your physician has recommended you make the following change in your medication: Stop potassium   *If you need a refill on your cardiac medications before your next appointment, please call your pharmacy*   Lab Work: none If you have labs (blood work) drawn today and your tests are completely normal, you will receive your results only by: [place] (if you have MyChart) OR A paper copy in the mail If you have any lab test that is abnormal or we need to change your treatment, we will call you to review the results.   Testing/Procedures: none   Follow-Up: At [facility], you and your health needs are our priority.  As part of our continuing mission to provide you with exceptional heart care, we have created designated Provider Care Teams.  These Care Teams include your primary Cardiologist (physician) and Advanced Practice Providers (APPs -  Physician Assistants and Nurse Practitioners) who all work together to provide you with the care you need, when you need it.  We recommend signing up for the patient portal called "MyChart".  Sign up information is provided on this After Visit Summary.  MyChart is used to connect with patients for Virtual Visits (Telemedicine).  Patients are able to view lab/test results, encounter notes, upcoming appointments, etc.  Non-urgent messages can be sent to your provider as well.   To learn more about what you can do with MyChart, go to [email or web address].    Your next appointment:   6 month(s)  The format for your next appointment:   In Person  Provider:   Larae Grooms, MD     Other Instructions [facility] have been [facility]   Please schedule follow up appointment with Dr Curt Bears  Important Information About Sugar

## 2022-01-14 DIAGNOSIS — I110 Hypertensive heart disease with heart failure: Secondary | ICD-10-CM | POA: Diagnosis not present

## 2022-01-14 DIAGNOSIS — R2689 Other abnormalities of gait and mobility: Secondary | ICD-10-CM | POA: Diagnosis not present

## 2022-01-14 DIAGNOSIS — M1990 Unspecified osteoarthritis, unspecified site: Secondary | ICD-10-CM | POA: Diagnosis not present

## 2022-01-14 DIAGNOSIS — E669 Obesity, unspecified: Secondary | ICD-10-CM | POA: Diagnosis not present

## 2022-01-16 ENCOUNTER — Telehealth: Payer: Self-pay | Admitting: Interventional Cardiology

## 2022-01-16 DIAGNOSIS — H353132 Nonexudative age-related macular degeneration, bilateral, intermediate dry stage: Secondary | ICD-10-CM | POA: Diagnosis not present

## 2022-01-16 MED ORDER — SPIRONOLACTONE 25 MG PO TABS: 25.0000 mg | ORAL_TABLET | Freq: Every day | ORAL | 3 refills | Status: DC

## 2022-01-16 NOTE — Telephone Encounter (Signed)
Pt c/o medication issue:  1. Name of Medication: spironolactone (ALDACTONE) 25 MG tablet  2. How are you currently taking this medication (dosage and times per day)?  Take 1 tablet (25 mg total) by mouth daily.  3. Are you having a reaction (difficulty breathing--STAT)? no  4. What is your medication issue? Patient states he had a visit on Friday and this medication was supposed to be sent to the [place], [place], but it wasn't received.

## 2022-01-16 NOTE — Telephone Encounter (Signed)
Patient stated that he went to pick up his new medication started on Friday but he needed a refill of his Spironolactone. Sent in to [facility] at patients request.

## 2022-01-17 DIAGNOSIS — M25561 Pain in right knee: Secondary | ICD-10-CM | POA: Diagnosis not present

## 2022-01-19 ENCOUNTER — Telehealth (HOSPITAL_COMMUNITY): Payer: Self-pay | Admitting: Cardiology

## 2022-01-19 DIAGNOSIS — I5043 Acute on chronic combined systolic (congestive) and diastolic (congestive) heart failure: Secondary | ICD-10-CM | POA: Diagnosis not present

## 2022-01-19 DIAGNOSIS — M1990 Unspecified osteoarthritis, unspecified site: Secondary | ICD-10-CM | POA: Diagnosis not present

## 2022-01-19 DIAGNOSIS — I4821 Permanent atrial fibrillation: Secondary | ICD-10-CM | POA: Diagnosis not present

## 2022-01-19 DIAGNOSIS — K279 Peptic ulcer, site unspecified, unspecified as acute or chronic, without hemorrhage or perforation: Secondary | ICD-10-CM | POA: Diagnosis not present

## 2022-01-19 DIAGNOSIS — Z950 Presence of cardiac pacemaker: Secondary | ICD-10-CM | POA: Diagnosis not present

## 2022-01-19 DIAGNOSIS — G4733 Obstructive sleep apnea (adult) (pediatric): Secondary | ICD-10-CM | POA: Diagnosis not present

## 2022-01-19 DIAGNOSIS — C8590 Non-Hodgkin lymphoma, unspecified, unspecified site: Secondary | ICD-10-CM | POA: Diagnosis not present

## 2022-01-19 DIAGNOSIS — Z7984 Long term (current) use of oral hypoglycemic drugs: Secondary | ICD-10-CM | POA: Diagnosis not present

## 2022-01-19 DIAGNOSIS — D631 Anemia in chronic kidney disease: Secondary | ICD-10-CM | POA: Diagnosis not present

## 2022-01-19 DIAGNOSIS — S81801D Unspecified open wound, right lower leg, subsequent encounter: Secondary | ICD-10-CM | POA: Diagnosis not present

## 2022-01-19 DIAGNOSIS — I442 Atrioventricular block, complete: Secondary | ICD-10-CM | POA: Diagnosis not present

## 2022-01-19 DIAGNOSIS — E785 Hyperlipidemia, unspecified: Secondary | ICD-10-CM | POA: Diagnosis not present

## 2022-01-19 DIAGNOSIS — N400 Enlarged prostate without lower urinary tract symptoms: Secondary | ICD-10-CM | POA: Diagnosis not present

## 2022-01-19 DIAGNOSIS — I050 Rheumatic mitral stenosis: Secondary | ICD-10-CM | POA: Diagnosis not present

## 2022-01-19 DIAGNOSIS — N182 Chronic kidney disease, stage 2 (mild): Secondary | ICD-10-CM | POA: Diagnosis not present

## 2022-01-19 DIAGNOSIS — E1122 Type 2 diabetes mellitus with diabetic chronic kidney disease: Secondary | ICD-10-CM | POA: Diagnosis not present

## 2022-01-19 DIAGNOSIS — Z952 Presence of prosthetic heart valve: Secondary | ICD-10-CM | POA: Diagnosis not present

## 2022-01-19 DIAGNOSIS — I130 Hypertensive heart and chronic kidney disease with heart failure and stage 1 through stage 4 chronic kidney disease, or unspecified chronic kidney disease: Secondary | ICD-10-CM | POA: Diagnosis not present

## 2022-01-19 DIAGNOSIS — Z7901 Long term (current) use of anticoagulants: Secondary | ICD-10-CM | POA: Diagnosis not present

## 2022-01-19 DIAGNOSIS — I50810 Right heart failure, unspecified: Secondary | ICD-10-CM | POA: Diagnosis not present

## 2022-01-19 DIAGNOSIS — Z9181 History of falling: Secondary | ICD-10-CM | POA: Diagnosis not present

## 2022-01-19 DIAGNOSIS — Z87442 Personal history of urinary calculi: Secondary | ICD-10-CM | POA: Diagnosis not present

## 2022-01-19 NOTE — Telephone Encounter (Signed)
Pt called to f/u w/referral 

## 2022-01-24 ENCOUNTER — Ambulatory Visit (HOSPITAL_COMMUNITY)
Admission: RE | Admit: 2022-01-24 | Discharge: 2022-01-24 | Disposition: A | Discharge status: Home / Self Care | Payer: PPO | Source: Ambulatory Visit | Attending: Vascular Surgery | Admitting: Vascular Surgery

## 2022-01-24 ENCOUNTER — Other Ambulatory Visit: Payer: Self-pay | Admitting: *Deleted

## 2022-01-24 DIAGNOSIS — Z01818 Encounter for other preprocedural examination: Secondary | ICD-10-CM | POA: Insufficient documentation

## 2022-01-24 DIAGNOSIS — M9902 Segmental and somatic dysfunction of thoracic region: Secondary | ICD-10-CM | POA: Diagnosis not present

## 2022-01-24 DIAGNOSIS — M5134 Other intervertebral disc degeneration, thoracic region: Secondary | ICD-10-CM | POA: Diagnosis not present

## 2022-01-25 ENCOUNTER — Ambulatory Visit: Payer: PPO | Admitting: Vascular Surgery

## 2022-01-25 ENCOUNTER — Encounter: Payer: Self-pay | Admitting: Vascular Surgery

## 2022-01-25 VITALS — BP 95/57 | HR 67 | Temp 98.3°F | Resp 20 | Ht 70.0 in | Wt 191.0 lb

## 2022-01-25 DIAGNOSIS — I739 Peripheral vascular disease, unspecified: Secondary | ICD-10-CM | POA: Diagnosis not present

## 2022-01-25 NOTE — Progress Notes (Signed)
Patient ID: Gregory [name], male   DOB: [birth year], 81 y.o.   MRN: [id number]  Reason for Consult: New Patient (Initial Visit)   Referred by Willaim Sheng, MD  Subjective:     HPI:  Gregory [name] is a 81 y.o. male with chronic atrial fibrillation on anticoagulation also has diabetes, hypertension and previous history of TAVR.  Denies any history of vascular disease and is a lifelong non-smoker.  Currently he is having difficulty with walking mostly due to joint pain particularly right lower extremity pain when he has had multiple injections.  He is now being considered for right total knee arthroplasty.  No previous vascular interventions.  Recently bumped his right leg on a shelf this has been slow to heal but is gradually healing with local wound care.  Past Medical History:  Diagnosis Date   Amputation finger    Distal 4th & 5th digits of right hand   Arthritis    Chronic atrial fibrillation (HCC)    Chronic diastolic CHF (congestive heart failure) (HCC)    Diabetes mellitus type 2, noninsulin dependent (HCC)    History of blood transfusion    5 pints for peptic ulcer   History of kidney stones    Passed x 2   Hypertension    Mitral valve disease    mild-moderate MR, moderate MS 07/15/19 echo   OSA (obstructive sleep apnea)    Other malignant lymphomas, unspecified site, extranodal and solid organ sites    Non Hodkins Lymothopathy   Ruptured disk    S/P TAVR (transcatheter aortic valve replacement) 09/16/2019   s/p TAVR with a 29 mm Edwards Sapien 3 THV via the TF approach by Dr. Burt Knack and Dr. Cyndia Bent   Severe aortic stenosis    Ulcer    peptic Ulcer - bleeding   Family History  Problem Relation Age of Onset   Hypertension Father    Heart attack Father    Heart attack Mother    Cancer Mother        LUNG   Stroke Neg Hx    Past Surgical History:  Procedure Laterality Date   APPENDECTOMY  1967   COLONOSCOPY     left knee surgery     left shoulder surgery      MULTIPLE EXTRACTIONS WITH ALVEOLOPLASTY N/A 09/04/2019   Procedure: Extraction of tooth #'s 2,7,8,9,11,13,14 20,30, and 31 with alveoloplatsty and gross debridement of remaining teeth;  Surgeon: Lenn Cal, DDS;  Location: [facility];  Service: Oral Surgery;  Laterality: N/A;   PACEMAKER IMPLANT N/A 09/17/2019   Procedure: PACEMAKER IMPLANT;  Surgeon: Constance Haw, MD;  Location: [facility];  Service: Cardiovascular;  Laterality: N/A;   RIGHT/LEFT HEART CATH AND CORONARY ANGIOGRAPHY N/A 08/12/2019   Procedure: RIGHT/LEFT HEART CATH AND CORONARY ANGIOGRAPHY;  Surgeon: Jettie Booze, MD;  Location: [facility];  Service: Cardiovascular;  Laterality: N/A;   TEE WITHOUT CARDIOVERSION N/A 09/16/2019   Procedure: TRANSESOPHAGEAL ECHOCARDIOGRAM (TEE);  Surgeon: Sherren Mocha, MD;  Location: [facility];  Service: Open Heart Surgery;  Laterality: N/A;   TONSILLECTOMY     TRANSCATHETER AORTIC VALVE REPLACEMENT, TRANSFEMORAL N/A 09/16/2019   Procedure: TRANSCATHETER AORTIC VALVE REPLACEMENT, TRANSFEMORAL;  Surgeon: Sherren Mocha, MD;  Location: [facility];  Service: Open Heart Surgery;  Laterality: N/A;    Short Social History:  Social History   Tobacco Use   Smoking status: Never   Smokeless tobacco: Never  Substance  Use Topics   Alcohol use: No    Allergies  Allergen Reactions   Aspirin     visual issues and fatigue   Macrobid [Nitrofurantoin]    Nystatin    Prevnar 13 [Pneumococcal 13-Val Conj Vacc]    Bisoprolol-Hydrochlorothiazide Other (See Comments)    "Severe" arthralgias and myalgias   Nsaids Other (See Comments)    GI upset, Peptic ulcer disease    Current Outpatient Medications  Medication Sig Dispense Refill   acetaminophen (TYLENOL) 650 MG CR tablet Take 650 mg by mouth every 8 (eight) hours as needed for pain.     atorvastatin (LIPITOR) 20 MG tablet Take 20 mg by mouth daily.     cyanocobalamin 1000 MCG tablet Take 1,000 mcg  by mouth daily.     dapagliflozin propanediol (FARXIGA) 10 MG TABS tablet Take 1 tablet (10 mg total) by mouth daily. 30 tablet    feeding supplement (ENSURE ENLIVE / ENSURE PLUS) LIQD Take 237 mLs by mouth 2 (two) times daily between meals. 237 mL 12   gabapentin (NEURONTIN) 300 MG capsule daily.     insulin aspart (NOVOLOG) 100 UNIT/ML injection Inject 0-6 Units into the skin 3 (three) times daily with meals. 10 mL 11   ketoconazole (NIZORAL) 2 % cream Apply 1 Application topically 2 (two) times daily as needed for irritation.     losartan (COZAAR) 25 MG tablet Take 0.5 tablets (12.5 mg total) by mouth at bedtime. 45 tablet 3   metFORMIN (GLUCOPHAGE-XR) 500 MG 24 hr tablet Take 1 tablet (500 mg total) by mouth 2 (two) times daily. (Patient taking differently: Take 500 mg by mouth daily with breakfast.)     metoprolol succinate (TOPROL-XL) 50 MG 24 hr tablet Take 1 tablet (50 mg total) by mouth at bedtime. Take with or immediately following a meal.     Multiple Vitamins-Minerals (PRESERVISION AREDS 2 PO) Take 1 tablet by mouth daily.     ONE TOUCH ULTRA TEST test strip 1 each by Other route as needed (BLOOD SUGAR).      ONETOUCH DELICA LANCETS 40X MISC Apply 1 each topically daily as needed (BLOOD SUGAR).      senna-docusate (SENOKOT-S) 8.6-50 MG tablet Take 1 tablet by mouth at bedtime as needed for mild constipation.     spironolactone (ALDACTONE) 25 MG tablet Take 1 tablet (25 mg total) by mouth daily. 90 tablet 3   tamsulosin (FLOMAX) 0.4 MG CAPS capsule Take 0.4 mg by mouth daily.     torsemide (DEMADEX) 20 MG tablet Take 2 tablets (40 mg total) by mouth daily. 180 tablet 3   warfarin (COUMADIN) 6 MG tablet Take 6 mg by mouth daily.     No current facility-administered medications for this visit.    Review of Systems  Constitutional:  Constitutional negative. HENT: HENT negative.  Eyes: Eyes negative.  Cardiovascular: Cardiovascular negative.  GI: Gastrointestinal negative.   Musculoskeletal: Positive for gait problem, leg pain and joint pain.  Skin: Positive for wound.  Hematologic: Hematologic/lymphatic negative.  Psychiatric: Psychiatric negative.        Objective:  Objective   Vitals:   01/25/22 1436  BP: (!) 95/57  Pulse: 67  Resp: 20  Temp: 98.3 F (36.8 C)  SpO2: 95%  Weight: 191 lb (86.6 kg)  Height: '5\' 10"'$  (1.778 m)   Body mass index is 27.41 kg/m.  Physical Exam HENT:     Head: Normocephalic.     Nose: Nose normal.  Eyes:  Pupils: Pupils are equal, round, and reactive to light.  Cardiovascular:     Rate and Rhythm: Normal rate.     Pulses:          Radial pulses are 2+ on the right side and 2+ on the left side.       Femoral pulses are 2+ on the right side and 2+ on the left side.      Popliteal pulses are 0 on the right side and 0 on the left side.  Pulmonary:     Effort: Pulmonary effort is normal.  Abdominal:     General: Abdomen is flat.     Palpations: Abdomen is soft. There is no mass.  Musculoskeletal:     Cervical back: Neck supple.  Skin:    Capillary Refill: Capillary refill takes 2 to 3 seconds.     Comments: Superficial abrasions right anterior leg  Neurological:     Mental Status: He is alert.     Data: ABI Findings:  +---------+------------------+-----+----------+--------+  Right    Rt Pressure (mmHg)IndexWaveform  Comment   +---------+------------------+-----+----------+--------+  Brachial 109                                        +---------+------------------+-----+----------+--------+  PTA                             monophasicCNO       +---------+------------------+-----+----------+--------+  DP                              monophasicCNO       +---------+------------------+-----+----------+--------+  Great Toe37                0.34 Abnormal            +---------+------------------+-----+----------+--------+    +---------+------------------+-----+----------+-------+  Left     Lt Pressure (mmHg)IndexWaveform  Comment  +---------+------------------+-----+----------+-------+  Brachial 93                                        +---------+------------------+-----+----------+-------+  PTA                             monophasicCNO      +---------+------------------+-----+----------+-------+  DP                              monophasicCNO      +---------+------------------+-----+----------+-------+  Great Toe35                0.32 Normal             +---------+------------------+-----+----------+-------+   +-------+-----------+-----------+------------+------------+  ABI/TBIToday's ABIToday's TBIPrevious ABIPrevious TBI  +-------+-----------+-----------+------------+------------+  Right  [place]         0.34                                 +-------+-----------+-----------+------------+------------+  Left   [place]         0.32                                 +-------+-----------+-----------+------------+------------+  Arterial wall calcification precludes accurate ankle pressures and ABIs.     Summary:  Right: Resting right ankle-brachial index indicates noncompressible right  lower extremity arteries. The right toe-brachial index is abnormal.   Left: Resting left ankle-brachial index indicates noncompressible left  lower extremity arteries. The left toe-brachial index is abnormal.      Assessment/Plan:    81 year old male with need for right total knee arthroplasty with decreased ABIs bilaterally but more symptomatic from a joint standpoint does not appear to be able to walk far enough to claudicate at this time.  As long as he can heal his wounds on his leg I would not recommend any intervention prior to total knee arthroplasty given that he has palpable femoral pulses and any stenting would require Plavix and I would not want a bypass in place if  he required a tourniquet.  Certainly if he has delayed wound healing issues we can consider stenting postoperatively.  I will otherwise have him follow-up in 6 months with repeat ABIs and bilateral lower extremity duplex.     Waynetta Sandy MD [facility]

## 2022-01-27 ENCOUNTER — Other Ambulatory Visit: Payer: Self-pay

## 2022-01-27 DIAGNOSIS — I739 Peripheral vascular disease, unspecified: Secondary | ICD-10-CM

## 2022-01-31 ENCOUNTER — Telehealth: Payer: Self-pay | Admitting: Interventional Cardiology

## 2022-01-31 DIAGNOSIS — M9902 Segmental and somatic dysfunction of thoracic region: Secondary | ICD-10-CM | POA: Diagnosis not present

## 2022-01-31 DIAGNOSIS — M5134 Other intervertebral disc degeneration, thoracic region: Secondary | ICD-10-CM | POA: Diagnosis not present

## 2022-01-31 NOTE — Telephone Encounter (Signed)
Pt c/o medication issue:  1. Name of Medication: Spironolactone  2. How are you currently taking this medication (dosage and times per day)? 1 a day  3. Are you having a reaction (difficulty breathing--STAT)?   4. What is your medication issue? Patient is not sure if he took 1 pill or two pills of this medicine this morning. If he took 2 pills, what should he do?

## 2022-01-31 NOTE — Telephone Encounter (Signed)
I spoke with [facility]. She is not with patient.  Patient is not sure if he took an extra spironolactone today.  I advised Threasa Beards to have patient monitor BP today and go back to normal dosing tomorrow

## 2022-02-02 DIAGNOSIS — Z7901 Long term (current) use of anticoagulants: Secondary | ICD-10-CM | POA: Diagnosis not present

## 2022-02-07 DIAGNOSIS — M9902 Segmental and somatic dysfunction of thoracic region: Secondary | ICD-10-CM | POA: Diagnosis not present

## 2022-02-07 DIAGNOSIS — M5134 Other intervertebral disc degeneration, thoracic region: Secondary | ICD-10-CM | POA: Diagnosis not present

## 2022-02-09 DIAGNOSIS — M25561 Pain in right knee: Secondary | ICD-10-CM | POA: Diagnosis not present

## 2022-02-10 DIAGNOSIS — Z7984 Long term (current) use of oral hypoglycemic drugs: Secondary | ICD-10-CM | POA: Diagnosis not present

## 2022-02-10 DIAGNOSIS — Z6828 Body mass index (BMI) 28.0-28.9, adult: Secondary | ICD-10-CM | POA: Diagnosis not present

## 2022-02-10 DIAGNOSIS — I700 Atherosclerosis of aorta: Secondary | ICD-10-CM | POA: Diagnosis not present

## 2022-02-10 DIAGNOSIS — E1151 Type 2 diabetes mellitus with diabetic peripheral angiopathy without gangrene: Secondary | ICD-10-CM | POA: Diagnosis not present

## 2022-02-10 DIAGNOSIS — M19011 Primary osteoarthritis, right shoulder: Secondary | ICD-10-CM | POA: Diagnosis not present

## 2022-02-10 DIAGNOSIS — E1140 Type 2 diabetes mellitus with diabetic neuropathy, unspecified: Secondary | ICD-10-CM | POA: Diagnosis not present

## 2022-02-10 DIAGNOSIS — C8590 Non-Hodgkin lymphoma, unspecified, unspecified site: Secondary | ICD-10-CM | POA: Diagnosis not present

## 2022-02-14 DIAGNOSIS — M1990 Unspecified osteoarthritis, unspecified site: Secondary | ICD-10-CM | POA: Diagnosis not present

## 2022-02-14 DIAGNOSIS — M9902 Segmental and somatic dysfunction of thoracic region: Secondary | ICD-10-CM | POA: Diagnosis not present

## 2022-02-14 DIAGNOSIS — R2689 Other abnormalities of gait and mobility: Secondary | ICD-10-CM | POA: Diagnosis not present

## 2022-02-14 DIAGNOSIS — E669 Obesity, unspecified: Secondary | ICD-10-CM | POA: Diagnosis not present

## 2022-02-14 DIAGNOSIS — M5134 Other intervertebral disc degeneration, thoracic region: Secondary | ICD-10-CM | POA: Diagnosis not present

## 2022-02-14 DIAGNOSIS — I110 Hypertensive heart disease with heart failure: Secondary | ICD-10-CM | POA: Diagnosis not present

## 2022-02-15 DIAGNOSIS — I5032 Chronic diastolic (congestive) heart failure: Secondary | ICD-10-CM | POA: Diagnosis not present

## 2022-02-15 DIAGNOSIS — E1140 Type 2 diabetes mellitus with diabetic neuropathy, unspecified: Secondary | ICD-10-CM | POA: Diagnosis not present

## 2022-02-15 DIAGNOSIS — M1711 Unilateral primary osteoarthritis, right knee: Secondary | ICD-10-CM | POA: Diagnosis not present

## 2022-02-15 DIAGNOSIS — I4821 Permanent atrial fibrillation: Secondary | ICD-10-CM | POA: Diagnosis not present

## 2022-02-15 DIAGNOSIS — E7800 Pure hypercholesterolemia, unspecified: Secondary | ICD-10-CM | POA: Diagnosis not present

## 2022-02-15 DIAGNOSIS — I10 Essential (primary) hypertension: Secondary | ICD-10-CM | POA: Diagnosis not present

## 2022-02-21 DIAGNOSIS — M5134 Other intervertebral disc degeneration, thoracic region: Secondary | ICD-10-CM | POA: Diagnosis not present

## 2022-02-21 DIAGNOSIS — Z0000 Encounter for general adult medical examination without abnormal findings: Secondary | ICD-10-CM | POA: Diagnosis not present

## 2022-02-21 DIAGNOSIS — Z6828 Body mass index (BMI) 28.0-28.9, adult: Secondary | ICD-10-CM | POA: Diagnosis not present

## 2022-02-21 DIAGNOSIS — M9902 Segmental and somatic dysfunction of thoracic region: Secondary | ICD-10-CM | POA: Diagnosis not present

## 2022-02-21 DIAGNOSIS — Z1389 Encounter for screening for other disorder: Secondary | ICD-10-CM | POA: Diagnosis not present

## 2022-02-28 DIAGNOSIS — M5134 Other intervertebral disc degeneration, thoracic region: Secondary | ICD-10-CM | POA: Diagnosis not present

## 2022-02-28 DIAGNOSIS — M9902 Segmental and somatic dysfunction of thoracic region: Secondary | ICD-10-CM | POA: Diagnosis not present

## 2022-03-02 DIAGNOSIS — Z7901 Long term (current) use of anticoagulants: Secondary | ICD-10-CM | POA: Diagnosis not present

## 2022-03-07 DIAGNOSIS — M9902 Segmental and somatic dysfunction of thoracic region: Secondary | ICD-10-CM | POA: Diagnosis not present

## 2022-03-07 DIAGNOSIS — M5134 Other intervertebral disc degeneration, thoracic region: Secondary | ICD-10-CM | POA: Diagnosis not present

## 2022-03-14 ENCOUNTER — Ambulatory Visit: Payer: PPO | Attending: Cardiology | Admitting: Cardiology

## 2022-03-14 ENCOUNTER — Encounter: Payer: Self-pay | Admitting: Cardiology

## 2022-03-14 VITALS — BP 126/78 | HR 75 | Ht 70.0 in | Wt 196.0 lb

## 2022-03-14 DIAGNOSIS — D6869 Other thrombophilia: Secondary | ICD-10-CM | POA: Diagnosis not present

## 2022-03-14 DIAGNOSIS — I442 Atrioventricular block, complete: Secondary | ICD-10-CM

## 2022-03-14 DIAGNOSIS — I4821 Permanent atrial fibrillation: Secondary | ICD-10-CM | POA: Diagnosis not present

## 2022-03-14 DIAGNOSIS — I10 Essential (primary) hypertension: Secondary | ICD-10-CM | POA: Diagnosis not present

## 2022-03-14 MED ORDER — LOSARTAN POTASSIUM 25 MG PO TABS: 25.0000 mg | ORAL_TABLET | Freq: Every day | ORAL | 3 refills | Status: DC

## 2022-03-14 NOTE — Patient Instructions (Addendum)
Medication Instructions:  Your physician has recommended you make the following change in your medication:  STOP Aldactone INCREASE Losartan to 25 mg once daily  *If you need a refill on your cardiac medications before your next appointment, please call your pharmacy*   Lab Work: None ordered If you have labs (blood work) drawn today and your tests are completely normal, you will receive your results only by: [place] (if you have MyChart) OR A paper copy in the mail If you have any lab test that is abnormal or we need to change your treatment, we will call you to review the results.   Testing/Procedures: None ordered   Follow-Up: At [facility], you and your health needs are our priority.  As part of our continuing mission to provide you with exceptional heart care, we have created designated Provider Care Teams.  These Care Teams include your primary Cardiologist (physician) and Advanced Practice Providers (APPs -  Physician Assistants and Nurse Practitioners) who all work together to provide you with the care you need, when you need it.  Remote monitoring is used to monitor your Pacemaker or ICD from home. This monitoring reduces the number of office visits required to check your device to one time per year. It allows [place] to keep an eye on the functioning of your device to ensure it is working properly. You are scheduled for a device check from home on 03/16/2022, 06/15/2022. You may send your transmission at any time that day. If you have a wireless device, the transmission will be sent automatically. After your physician reviews your transmission, you will receive a postcard with your next transmission date.  Your next appointment:   1 year(s)  The format for your next appointment:   In Person  Provider:   Allegra Lai, MD    Thank you for choosing [facility]!!   Trinidad Curet, RN [phone]    Other Instructions  Important Information About  [place]

## 2022-03-14 NOTE — Progress Notes (Signed)
Rate is   Electrophysiology Office Note   Date:  03/14/2022   ID:  Gregory [name], DOB 12-11-1940, MRN [id number]  PCP:  Kathyrn Lass, MD  Cardiologist:  Irish Lack Primary Electrophysiologist:  Gregory Goytia Meredith Leeds, MD    Chief Complaint: complete heart block   History of Present Illness: Gregory [name] is a 81 y.o. male who is being seen today for the evaluation of complete heart block at the request of Kathyrn Lass, MD. Presenting today for electrophysiology evaluation.  Is a history significant for permanent atrial fibrillation, diastolic heart failure, mitral regurgitation, hide non-Hodgkin's lymphoma, diabetes, hypertension, severe MAC with moderate to severe MR, severe aortic stenosis post TAVR 09/16/2019.  Post TAVR he was noted to have complete heart block and is post Medtronic single-chamber pacemaker implanted 09/17/2019.  Today, denies symptoms of palpitations, chest pain, shortness of breath, orthopnea, PND, lower extremity edema, claudication, dizziness, presyncope, syncope, bleeding, or neurologic sequela. The patient is tolerating medications without difficulties.  Being seen he has done well.  He is having issues with his right knee.  He has an appointment later this week to discuss knee replacement surgery.  His main issue today is that he is having breast tenderness.  This has been occurring for many months.  He is on Aldactone.   Past Medical History:  Diagnosis Date   Amputation finger    Distal 4th & 5th digits of right hand   Arthritis    Chronic atrial fibrillation (HCC)    Chronic diastolic CHF (congestive heart failure) (HCC)    Diabetes mellitus type 2, noninsulin dependent (HCC)    History of blood transfusion    5 pints for peptic ulcer   History of kidney stones    Passed x 2   Hypertension    Mitral valve disease    mild-moderate MR, moderate MS 07/15/19 echo   OSA (obstructive sleep apnea)    Other malignant lymphomas, unspecified site, extranodal and  solid organ sites    Non Hodkins Lymothopathy   Ruptured disk    S/P TAVR (transcatheter aortic valve replacement) 09/16/2019   s/p TAVR with a 29 mm Edwards Sapien 3 THV via the TF approach by Dr. Burt Knack and Dr. Cyndia Bent   Severe aortic stenosis    Ulcer    peptic Ulcer - bleeding   Past Surgical History:  Procedure Laterality Date   APPENDECTOMY  1967   COLONOSCOPY     left knee surgery     left shoulder surgery     MULTIPLE EXTRACTIONS WITH ALVEOLOPLASTY N/A 09/04/2019   Procedure: Extraction of tooth #'s 2,7,8,9,11,13,14 20,30, and 31 with alveoloplatsty and gross debridement of remaining teeth;  Surgeon: Lenn Cal, DDS;  Location: [facility];  Service: Oral Surgery;  Laterality: N/A;   PACEMAKER IMPLANT N/A 09/17/2019   Procedure: PACEMAKER IMPLANT;  Surgeon: Constance Haw, MD;  Location: [place] [facility];  Service: Cardiovascular;  Laterality: N/A;   RIGHT/LEFT HEART CATH AND CORONARY ANGIOGRAPHY N/A 08/12/2019   Procedure: RIGHT/LEFT HEART CATH AND CORONARY ANGIOGRAPHY;  Surgeon: Jettie Booze, MD;  Location: [place] [facility];  Service: Cardiovascular;  Laterality: N/A;   TEE WITHOUT CARDIOVERSION N/A 09/16/2019   Procedure: TRANSESOPHAGEAL ECHOCARDIOGRAM (TEE);  Surgeon: Sherren Mocha, MD;  Location: [place] [facility];  Service: Open Heart Surgery;  Laterality: N/A;   TONSILLECTOMY     TRANSCATHETER AORTIC VALVE REPLACEMENT, TRANSFEMORAL N/A 09/16/2019   Procedure: TRANSCATHETER AORTIC VALVE REPLACEMENT, TRANSFEMORAL;  Surgeon: Sherren Mocha, MD;  Location: [place] [facility];  Service: Open Heart Surgery;  Laterality: N/A;     Current Outpatient Medications  Medication Sig Dispense Refill   acetaminophen (TYLENOL) 650 MG CR tablet Take 650 mg by mouth every 8 (eight) hours as needed for pain.     atorvastatin (LIPITOR) 20 MG tablet Take 20 mg by mouth daily.     cyanocobalamin 1000 MCG tablet Take 1,000 mcg by mouth daily.     dapagliflozin propanediol  (FARXIGA) 10 MG TABS tablet Take 1 tablet (10 mg total) by mouth daily. 30 tablet    feeding supplement (ENSURE ENLIVE / ENSURE PLUS) LIQD Take 237 mLs by mouth 2 (two) times daily between meals. 237 mL 12   gabapentin (NEURONTIN) 300 MG capsule daily.     insulin aspart (NOVOLOG) 100 UNIT/ML injection Inject 0-6 Units into the skin 3 (three) times daily with meals. 10 mL 11   ketoconazole (NIZORAL) 2 % cream Apply 1 Application topically 2 (two) times daily as needed for irritation.     losartan (COZAAR) 25 MG tablet Take 1 tablet (25 mg total) by mouth daily. 90 tablet 3   metFORMIN (GLUCOPHAGE-XR) 500 MG 24 hr tablet Take 1 tablet (500 mg total) by mouth 2 (two) times daily. (Patient taking differently: Take 500 mg by mouth daily with breakfast.)     metoprolol succinate (TOPROL-XL) 50 MG 24 hr tablet Take 1 tablet (50 mg total) by mouth at bedtime. Take with or immediately following a meal.     Multiple Vitamins-Minerals (PRESERVISION AREDS 2 PO) Take 1 tablet by mouth daily.     ONE TOUCH ULTRA TEST test strip 1 each by Other route as needed (BLOOD SUGAR).      ONETOUCH DELICA LANCETS 10X MISC Apply 1 each topically daily as needed (BLOOD SUGAR).      senna-docusate (SENOKOT-S) 8.6-50 MG tablet Take 1 tablet by mouth at bedtime as needed for mild constipation.     tamsulosin (FLOMAX) 0.4 MG CAPS capsule Take 0.4 mg by mouth daily.     torsemide (DEMADEX) 20 MG tablet Take 2 tablets (40 mg total) by mouth daily. 180 tablet 3   warfarin (COUMADIN) 6 MG tablet Take 6 mg by mouth daily.     No current facility-administered medications for this visit.    Allergies:   Aspirin, Macrobid [nitrofurantoin], Nystatin, Prevnar 13 [pneumococcal 13-val conj vacc], Spironolactone, Bisoprolol-hydrochlorothiazide, and Nsaids   Social History:  The patient  reports that he has never smoked. He has never used smokeless tobacco. He reports that he does not drink alcohol and does not use drugs.   Family  History:  The patient's family history includes Cancer in his mother; Heart attack in his father and mother; Hypertension in his father.   ROS:  Please see the history of present illness.   Otherwise, review of systems is positive for none.   All other systems are reviewed and negative.   PHYSICAL EXAM: VS:  BP 126/78   Pulse 75   Ht _0  (1.778 m)   Wt 196 lb (88.9 kg)   SpO2 98%   BMI 28.12 kg/m  , BMI Body mass index is 28.12 kg/m. GEN: Well nourished, well developed, in no acute distress  HEENT: normal  Neck: no JVD, carotid bruits, or masses Cardiac: RRR; no murmurs, rubs, or gallops,no edema  Respiratory:  clear to auscultation bilaterally, normal work of breathing GI: soft, nontender, nondistended, + BS MS: no deformity or atrophy  Skin: warm  and dry, device site well healed Neuro:  Strength and sensation are intact Psych: euthymic mood, full affect  EKG:  EKG is ordered today. Personal review of the ekg ordered shows AF, V paced  Personal review of the device interrogation today. Results in [place]: 09/27/2021: ALT 19; B Natriuretic Peptide 354.5 09/29/2021: Magnesium 2.0 10/04/2021: BUN 30; Creatinine, Ser 1.24; Hemoglobin 10.3; Platelets 156; Potassium 3.4; Sodium 138    Lipid Panel  No results found for: "CHOL", "TRIG", "HDL", "CHOLHDL", "VLDL", "LDLCALC", "LDLDIRECT"   Wt Readings from Last 3 Encounters:  03/14/22 196 lb (88.9 kg)  01/25/22 191 lb (86.6 kg)  01/13/22 194 lb (88 kg)      Other studies Reviewed: Additional studies/ records that were reviewed today include: TTE 10/22/19  Review of the above records today demonstrates:   1. Ventricular bigeminy present.   2. Left ventricular ejection fraction, by estimation, is 55 to 60%. The  left ventricle has normal function. The left ventricle has no regional  wall motion abnormalities. Left ventricular diastolic function could not  be evaluated.   3. Right ventricular systolic function is  low normal. The right  ventricular size is mildly enlarged. There is mildly elevated pulmonary  artery systolic pressure. The estimated right ventricular systolic  pressure is 98.9 mmHg.   4. Left atrial size was severely dilated.   5. Right atrial size was mild to moderately dilated.   6. The mitral valve is degenerative. Mild to moderate mitral valve  regurgitation. Moderate to severe mitral stenosis. The mean mitral valve  gradient is 8.3 mmHg with average heart rate of 33 bpm.   7. 29 mm S3. V max 2.0 m/s, MG 8 mmHG, EOA 2.03 cm2, DI 0.41. No evidence  of paravalvular leak. Difficult assessment due to ventricular bigeminy.  The aortic valve has been repaired/replaced. Aortic valve regurgitation is  not visualized. There is a 29 mm   Edwards Sapien prosthetic (TAVR) valve present in the aortic position.  Procedure Date: 09/16/2019. Echo findings are consistent with normal  structure and function of the aortic valve prosthesis.   8. The inferior vena cava is normal in size with greater than 50%  respiratory variability, suggesting right atrial pressure of 3 mmHg.    ASSESSMENT AND PLAN:  1.  Complete heart block: Occurred post TAVR.  He is status post Medtronic single-chamber pacemaker.  Device function appropriately.  No changes at this time.  2.  Permanent atrial fibrillation: CHA2DS2-VASc of 5.  Currently on Coumadin  3.  Severe aortic stenosis: Status post TAVR.  Echo with stable valve.  Plan per primary cardiology.  4.  PVCs: Continue Toprol-XL  5.  Secondary hypercoagulable state: Currently on Coumadin for atrial fibrillation as above  6.  Hypertension: Blood pressure well-controlled.  He is having breast tenderness.  He is on Aldactone.  Murel Shenberger stop Aldactone and increase losartan to 25 mg daily.  Current medicines are reviewed at length with the patient today.   The patient does not have concerns regarding his medicines.  The following changes were made today: Stop  Aldactone, increase losartan  Labs/ tests ordered today include:  Orders Placed This Encounter  Procedures   EKG 12-Lead      Disposition:   FU 12 months  Signed, Joshual Terrio Meredith Leeds, MD  03/14/2022 3:06 PM     [address] [place] [address] [phone] (office) [phone] (fax)

## 2022-03-16 ENCOUNTER — Ambulatory Visit (INDEPENDENT_AMBULATORY_CARE_PROVIDER_SITE_OTHER): Payer: PPO

## 2022-03-16 DIAGNOSIS — E669 Obesity, unspecified: Secondary | ICD-10-CM | POA: Diagnosis not present

## 2022-03-16 DIAGNOSIS — R2689 Other abnormalities of gait and mobility: Secondary | ICD-10-CM | POA: Diagnosis not present

## 2022-03-16 DIAGNOSIS — I442 Atrioventricular block, complete: Secondary | ICD-10-CM | POA: Diagnosis not present

## 2022-03-16 DIAGNOSIS — M1990 Unspecified osteoarthritis, unspecified site: Secondary | ICD-10-CM | POA: Diagnosis not present

## 2022-03-16 DIAGNOSIS — I110 Hypertensive heart disease with heart failure: Secondary | ICD-10-CM | POA: Diagnosis not present

## 2022-03-16 DIAGNOSIS — M25561 Pain in right knee: Secondary | ICD-10-CM | POA: Diagnosis not present

## 2022-03-16 LAB — CUP PACEART REMOTE DEVICE CHECK
Battery Remaining Longevity: 113 mo
Battery Voltage: 3.01 V
Brady Statistic RV Percent Paced: 99.26 %
Date Time Interrogation Session: 20231213201012
Implantable Lead Connection Status: 753985
Implantable Lead Implant Date: 20210616
Implantable Lead Location: 753860
Implantable Lead Model: 5076
Implantable Pulse Generator Implant Date: 20210616
Lead Channel Impedance Value: 437 Ohm
Lead Channel Impedance Value: 494 Ohm
Lead Channel Pacing Threshold Amplitude: 0.5 V
Lead Channel Pacing Threshold Pulse Width: 0.4 ms
Lead Channel Sensing Intrinsic Amplitude: 9.75 mV
Lead Channel Sensing Intrinsic Amplitude: 9.75 mV
Lead Channel Setting Pacing Amplitude: 2.5 V
Lead Channel Setting Pacing Pulse Width: 0.4 ms
Lead Channel Setting Sensing Sensitivity: 4 mV
Zone Setting Status: 755011

## 2022-03-22 DIAGNOSIS — I10 Essential (primary) hypertension: Secondary | ICD-10-CM | POA: Diagnosis not present

## 2022-03-22 DIAGNOSIS — E1140 Type 2 diabetes mellitus with diabetic neuropathy, unspecified: Secondary | ICD-10-CM | POA: Diagnosis not present

## 2022-03-22 DIAGNOSIS — E7800 Pure hypercholesterolemia, unspecified: Secondary | ICD-10-CM | POA: Diagnosis not present

## 2022-03-22 DIAGNOSIS — I5032 Chronic diastolic (congestive) heart failure: Secondary | ICD-10-CM | POA: Diagnosis not present

## 2022-03-30 DIAGNOSIS — Z7901 Long term (current) use of anticoagulants: Secondary | ICD-10-CM | POA: Diagnosis not present

## 2022-04-07 ENCOUNTER — Telehealth: Payer: Self-pay | Admitting: Interventional Cardiology

## 2022-04-07 DIAGNOSIS — N400 Enlarged prostate without lower urinary tract symptoms: Secondary | ICD-10-CM | POA: Diagnosis not present

## 2022-04-07 DIAGNOSIS — I4891 Unspecified atrial fibrillation: Secondary | ICD-10-CM | POA: Diagnosis not present

## 2022-04-07 DIAGNOSIS — Z950 Presence of cardiac pacemaker: Secondary | ICD-10-CM | POA: Diagnosis not present

## 2022-04-07 DIAGNOSIS — D649 Anemia, unspecified: Secondary | ICD-10-CM | POA: Diagnosis not present

## 2022-04-07 DIAGNOSIS — Z6830 Body mass index (BMI) 30.0-30.9, adult: Secondary | ICD-10-CM | POA: Diagnosis not present

## 2022-04-07 DIAGNOSIS — R635 Abnormal weight gain: Secondary | ICD-10-CM | POA: Diagnosis not present

## 2022-04-07 DIAGNOSIS — I10 Essential (primary) hypertension: Secondary | ICD-10-CM | POA: Diagnosis not present

## 2022-04-07 DIAGNOSIS — I5032 Chronic diastolic (congestive) heart failure: Secondary | ICD-10-CM | POA: Diagnosis not present

## 2022-04-07 DIAGNOSIS — E6609 Other obesity due to excess calories: Secondary | ICD-10-CM | POA: Diagnosis not present

## 2022-04-07 DIAGNOSIS — Z9989 Dependence on other enabling machines and devices: Secondary | ICD-10-CM | POA: Diagnosis not present

## 2022-04-07 DIAGNOSIS — E1140 Type 2 diabetes mellitus with diabetic neuropathy, unspecified: Secondary | ICD-10-CM | POA: Diagnosis not present

## 2022-04-07 NOTE — Progress Notes (Signed)
Remote pacemaker transmission.   

## 2022-04-07 NOTE — Telephone Encounter (Signed)
Returned call to [place], but had to leave message. Will await call back.

## 2022-04-07 NOTE — Telephone Encounter (Signed)
Gregory [name] from [facility] called said that the spiralactone was stopped. Patient was put on lasartan. She would like to discuss medication

## 2022-04-10 DIAGNOSIS — M25561 Pain in right knee: Secondary | ICD-10-CM | POA: Diagnosis not present

## 2022-04-10 NOTE — Telephone Encounter (Signed)
I returned call and received message from [place].  --She is calling to report patient's weight has gone up some since stopping spironolactone.Weight on 1/5 was 214 lbs.  Spironolactone was stopped due to breast tenderness. Potassium was previously stopped when patient was on spironolactone and losartan.  Now that spironolactone has been stopped Romualdo Bolk is asking if patient needs to restart potassium. Potassium was 4.4 on 04/07/22 and 5.0 on 01/26/22

## 2022-04-10 NOTE — Telephone Encounter (Signed)
Romualdo Bolk returned RN's call.  Caller stated her ext. # [phone].

## 2022-04-11 ENCOUNTER — Telehealth (HOSPITAL_COMMUNITY): Payer: Self-pay | Admitting: Surgery

## 2022-04-11 NOTE — Telephone Encounter (Signed)
I called patient in reference to scheduling an appt in [facility] after referral from Cardiology.  I requested that he return the call.

## 2022-04-11 NOTE — Telephone Encounter (Signed)
If he is not having symptoms, would have him keep his appt on 1/12 and they can assess his volume status at that time.

## 2022-04-11 NOTE — Telephone Encounter (Signed)
No need to restart potassium.  Is the breast tenderness better?  Is he more short of breath?  What was his prior weight before the 214 lbs. Thanks

## 2022-04-11 NOTE — Telephone Encounter (Signed)
Spoke to [place]. Advised pt does not need to restart the K+ per Dr. Irish Lack.  She report pt weight on 12/12 was 196 pd. Was 214 lb on 1/5  Pt breasts still tender, he cannot lay on his stomach to sleep d/t tenderness. Pt denies sob or LEE per [place].  Will forward back to MD for further review/advisement.

## 2022-04-12 NOTE — Telephone Encounter (Signed)
Attempt made to contact patient / family to assess if patient is still having symptoms per Dr. Hassell Done request.  No answer when number called, voicemail message left to follow up with [facility] T[facility] tomorrow.

## 2022-04-14 ENCOUNTER — Encounter (HOSPITAL_COMMUNITY): Payer: Self-pay | Admitting: Cardiology

## 2022-04-14 ENCOUNTER — Ambulatory Visit (HOSPITAL_COMMUNITY)
Admission: RE | Admit: 2022-04-14 | Discharge: 2022-04-14 | Disposition: A | Discharge status: Home / Self Care | Payer: PPO | Source: Ambulatory Visit | Attending: Cardiology | Admitting: Cardiology

## 2022-04-14 VITALS — BP 102/60 | HR 58 | Wt 218.0 lb

## 2022-04-14 DIAGNOSIS — I50810 Right heart failure, unspecified: Secondary | ICD-10-CM

## 2022-04-14 DIAGNOSIS — I340 Nonrheumatic mitral (valve) insufficiency: Secondary | ICD-10-CM

## 2022-04-14 DIAGNOSIS — I5032 Chronic diastolic (congestive) heart failure: Secondary | ICD-10-CM

## 2022-04-14 DIAGNOSIS — I050 Rheumatic mitral stenosis: Secondary | ICD-10-CM | POA: Diagnosis not present

## 2022-04-14 LAB — BASIC METABOLIC PANEL
Anion gap: 11 (ref 5–15)
BUN: 28 mg/dL — ABNORMAL HIGH (ref 8–23)
CO2: 25 mmol/L (ref 22–32)
Calcium: 8.4 mg/dL — ABNORMAL LOW (ref 8.9–10.3)
Chloride: 97 mmol/L — ABNORMAL LOW (ref 98–111)
Creatinine, Ser: 0.97 mg/dL (ref 0.61–1.24)
GFR, Estimated: >60 mL/min (ref >60)
Glucose, Bld: 125 mg/dL — ABNORMAL HIGH (ref 70–99)
Potassium: 4.2 mmol/L (ref 3.5–5.1)
Sodium: 133 mmol/L — ABNORMAL LOW (ref 135–145)

## 2022-04-14 MED ORDER — DIGOXIN 125 MCG PO TABS: 0.0625 mg | ORAL_TABLET | Freq: Every day | ORAL | 3 refills | Status: DC

## 2022-04-14 NOTE — Telephone Encounter (Signed)
Patient was seen in CHF clinic today

## 2022-04-14 NOTE — Progress Notes (Signed)
ADVANCED HEART FAILURE CLINIC NOTE  Referring Physician: Kathyrn Lass, MD  Primary Care: Kathyrn Lass, MD Primary Cardiologist: Dr. Irish Lack   HPI: Gregory [name] is a 82 y.o. male with severe AS s/p TAVR, CHB after TAVR now w/ single chamber ICD, HFpEF, permanent atrial fibrillation, hx of GI bleed, hx of non-hodgkins lymphoma presenting today for evaluation of mitral valve stenosis and HFpEF. Gregory [name] more recent cardiac history dates back to 2021 when he underwent heart catheterization for evaluation of severe aortic stenosis.  He was found to have nonobstructive CAD and underwent successful TAVR in June 2021.  Postoperative course was complicated by acute complete heart block requiring placement of Medtronic dual-chamber pacemaker.  After the procedure he had a repeat echo in July 2022 with EF of 55 to 60% severe left atrial enlargement and moderate to severe mitral stenosis.  He was seen in clinic in November of that year and reported doing fairly well aside from intermittent need of Lasix.  The following year in June 2023 he had reduction in EF to 45% with moderate to severely reduced RV systolic function.  Over the past 1 year he has continued to have intermittent dyspnea and lower extremity edema requiring admission on 2 different occasions.   Today [name] reports that he feels closer to his baseline functional status. He can perform ADLs independently. He is currently taking torsemide '40mg'$  daily; he has minimal orthopnea; does have some dyspnea after ambulating >80f but does not feel that it is limiting him at this time.   Activity level/exercise tolerance:  NYHA IIB-III Orthopnea:  Sleeps on 3 pillows Paroxysmal noctural dyspnea:  improved/ infrequent Chest pain/pressure:  no Orthostatic lightheadedness:  no Palpitations:  no Lower extremity edema:  yes Presyncope/syncope:  no Cough:  no  Past Medical History:  Diagnosis Date   Amputation finger    Distal 4th & 5th digits of  right hand   Arthritis    Chronic atrial fibrillation (HCC)    [facility]ronic diastolic [facility] ([place]estive heart failure) ([facility]    Diabetes mellitus type 2, noninsulin dependent ([facility]    History of blood transfusion    5 pints for peptic ulcer   History of kidney stones    Passed x 2   Hypertension    Mitral valve disease    mild-moderate MR, moderate MS 07/15/19 echo   OSA (obstructive sleep apnea)    Other malignant lymphomas, unspecified site, extranodal and solid organ sites    Non Hodkins Lymothopathy   Ruptured disk    S/P TAVR (transcatheter aortic valve replacement) 09/16/2019   s/p TAVR with a 29 mm Edwards Sapien 3 THV via the TF approach by Dr. CBurt Knackand Dr. BCyndia Bent  Severe aortic stenosis    Ulcer    peptic Ulcer - bleeding    Current Outpatient Medications  Medication Sig Dispense Refill   acetaminophen (TYLENOL) 650 MG CR tablet Take 650 mg by mouth every 8 (eight) hours as needed for pain.     atorvastatin (LIPITOR) 20 MG tablet Take 20 mg by mouth daily.     cyanocobalamin 1000 MCG tablet Take 1,000 mcg by mouth daily.     dapagliflozin propanediol (FARXIGA) 10 MG TABS tablet Take 1 tablet (10 mg total) by mouth daily. 30 tablet    digoxin (LANOXIN) 0.125 MG tablet Take 0.5 tablets (0.0625 mg total) by mouth daily. 45 tablet 3   feeding supplement (ENSURE ENLIVE / ENSURE PLUS) LIQD Take 237 mLs by mouth  2 (two) times daily between meals. 237 mL 12   gabapentin (NEURONTIN) 300 MG capsule daily.     ketoconazole (NIZORAL) 2 % cream Apply 1 Application topically 2 (two) times daily as needed for irritation.     losartan (COZAAR) 25 MG tablet Take 1 tablet (25 mg total) by mouth daily. 90 tablet 3   metFORMIN (GLUCOPHAGE-XR) 500 MG 24 hr tablet Take 1 tablet (500 mg total) by mouth 2 (two) times daily.     metoprolol succinate (TOPROL-XL) 50 MG 24 hr tablet Take 1 tablet (50 mg total) by mouth at bedtime. Take with or immediately following a meal.     Multiple Vitamins-Minerals  (PRESERVISION AREDS 2 PO) Take 1 tablet by mouth daily.     ONE TOUCH ULTRA TEST test strip 1 each by Other route as needed (BLOOD SUGAR).      ONETOUCH DELICA LANCETS 25K MISC Apply 1 each topically daily as needed (BLOOD SUGAR).      senna-docusate (SENOKOT-S) 8.6-50 MG tablet Take 1 tablet by mouth at bedtime as needed for mild constipation.     tamsulosin (FLOMAX) 0.4 MG CAPS capsule Take 0.4 mg by mouth daily.     torsemide (DEMADEX) 20 MG tablet Take 2 tablets (40 mg total) by mouth daily. 180 tablet 3   warfarin (COUMADIN) 6 MG tablet Take 6 mg by mouth daily.     No current facility-administered medications for this encounter.    Allergies  Allergen Reactions   Aspirin     visual issues and fatigue   Macrobid [Nitrofurantoin]    Nystatin    Prevnar 13 [Pneumococcal 13-Val Conj Vacc]    Spironolactone Other (See Comments)    Breast tenderness   Bisoprolol-Hydrochlorothiazide Other (See Comments)    "Severe" arthralgias and myalgias   Nsaids Other (See Comments)    GI upset, Peptic ulcer disease      Social History   Socioeconomic History   Marital status: Single    Spouse name: Not on file   Number of children: 0   Years of education: Not on file   Highest education level: High school graduate  Occupational History   Occupation: Retired-Trucking  Tobacco Use   Smoking status: Never   Smokeless tobacco: Never  Vaping Use   Vaping Use: Never used  Substance and Sexual Activity   Alcohol use: No   Drug use: No   Sexual activity: Never  Other Topics Concern   Not on file  Social History Narrative   Not on file   Social Determinants of Health   Financial Resource Strain: Low Risk  (09/29/2021)   Overall Financial Resource Strain (CARDIA)    Difficulty of Paying Living Expenses: Not very hard  Food Insecurity: No Food Insecurity (09/29/2021)   Hunger Vital Sign    Worried About Running Out of Food in the Last Year: Never true    Ran Out of Food in the Last  Year: Never true  Transportation Needs: No Transportation Needs (09/29/2021)   PRAPARE - Hydrologist (Medical): No    Lack of Transportation (Non-Medical): No  Physical Activity: Not on file  Stress: Not on file  Social Connections: Not on file  Intimate Partner Violence: Not on file      Family History  Problem Relation Age of Onset   Hypertension Father    Heart attack Father    Heart attack Mother    Cancer Mother  LUNG   Stroke Neg Hx     PHYSICAL EXAM: Vitals:   04/14/22 1214  BP: 102/60  Pulse: (!) 58  SpO2: 100%   GENERAL: elderly WM, no distress HEENT: Negative for arcus senilis or xanthelasma. There is no scleral icterus.  The mucous membranes are pink and moist.   NECK: Supple, No masses. Normal carotid upstrokes without bruits. No masses or thyromegaly.    CHEST: There are no chest wall deformities. There is no chest wall tenderness. Respirations are unlabored.  Lungs- CTA B/L CARDIAC:  JVP: 9 cm H2O         Normal S1, S2  Normal rate with regular rhythm. No murmurs, rubs or gallops.  Pulses are 2+ and symmetrical in upper and lower extremities. 1+ edema.  ABDOMEN: Soft, non-tender, non-distended. There are no masses or hepatomegaly. There are normal bowel sounds.  EXTREMITIES: Warm and well perfused with no cyanosis, clubbing.  LYMPHATIC: No axillary or supraclavicular lymphadenopathy.  NEUROLOGIC: Patient is oriented x3 with no focal or lateralizing neurologic deficits.  PSYCH: Patients affect is appropriate, there is no evidence of anxiety or depression.  SKIN: Warm and dry; no lesions or wounds.   DATA REVIEW  ECG:NSR  ECHO: 09/22/21: LVEF 45%, severely reduced RV function, thickened mitral valve with severe mitral stenosis and mitral regurgitation  CATH: 08/12/19 The left ventricular systolic function is normal. LV end diastolic pressure is normal. The left ventricular ejection fraction is 55-65% by visual  estimate. There is severe aortic valve stenosis. Mean gradient 46 mm Hg. There is mild mitral valve stenosis. Mitral valve area 2.1 cm2. Mean gradient 11 mm Hg. Hemodynamic findings consistent with mild pulmonary hypertension. No significant CAD.   ASSESSMENT & PLAN:  Severe MS/MR / HFpEF / RV failure Today I had a very lengthy discussion with Mr. Degen regarding his mitral valve disease. He has severe mitral stenosis (pressure half time 156m, MVA 1.1cm) and severe mitral regurgitation. His advanced age and frailty preclude him from MVR. The only viable option I see for him is valve in valve repair with TAVR valve in the mitral position. With his RV dysfunction and multiple co-morbidities this would be higher risk and I would not advise it. I explained the risks and benefits of all options to him. At this time, he is satisfied with his current functional status and would like to avoid invasive procedures.  - Continue current HF regimen; torsemide '40mg'$  daily.  - Add digoxin 0.06283m daily for RV support.  - Wilder Glade'10mg'a$  daily.  - Losartan '25mg'$  daily.  - Can consider upgrade to BiV device if he functionally declines; he has RV dysfunction and will likely not tolerate frequent RV pacing.  - Continue follow up with cardiology.   Bela Bonaparte Advanced Heart Failure Mechanical Circulatory Support

## 2022-04-14 NOTE — Patient Instructions (Signed)
Start Digoxin 0.0617mg ( 1/2 Tab) daily.  Labs done today, your results will be available in MyChart, we will contact you for abnormal readings.  Thank you for allowing [facility] provider your heart failure care. Please follow-up with Dr. VIrish Lackas scheduled.  If you have any questions, issues, or concerns before your next appointment please call our office at [phone] opt. 2 and leave a message for the triage nurse.

## 2022-04-16 DIAGNOSIS — I110 Hypertensive heart disease with heart failure: Secondary | ICD-10-CM | POA: Diagnosis not present

## 2022-04-16 DIAGNOSIS — R2689 Other abnormalities of gait and mobility: Secondary | ICD-10-CM | POA: Diagnosis not present

## 2022-04-16 DIAGNOSIS — E669 Obesity, unspecified: Secondary | ICD-10-CM | POA: Diagnosis not present

## 2022-04-16 DIAGNOSIS — M1990 Unspecified osteoarthritis, unspecified site: Secondary | ICD-10-CM | POA: Diagnosis not present

## 2022-04-20 ENCOUNTER — Telehealth: Payer: Self-pay | Admitting: Interventional Cardiology

## 2022-04-20 DIAGNOSIS — I10 Essential (primary) hypertension: Secondary | ICD-10-CM | POA: Diagnosis not present

## 2022-04-20 DIAGNOSIS — M25561 Pain in right knee: Secondary | ICD-10-CM | POA: Diagnosis not present

## 2022-04-20 DIAGNOSIS — N400 Enlarged prostate without lower urinary tract symptoms: Secondary | ICD-10-CM | POA: Diagnosis not present

## 2022-04-20 DIAGNOSIS — I5032 Chronic diastolic (congestive) heart failure: Secondary | ICD-10-CM | POA: Diagnosis not present

## 2022-04-20 DIAGNOSIS — E1140 Type 2 diabetes mellitus with diabetic neuropathy, unspecified: Secondary | ICD-10-CM | POA: Diagnosis not present

## 2022-04-20 DIAGNOSIS — E7800 Pure hypercholesterolemia, unspecified: Secondary | ICD-10-CM | POA: Diagnosis not present

## 2022-04-20 DIAGNOSIS — M1711 Unilateral primary osteoarthritis, right knee: Secondary | ICD-10-CM | POA: Diagnosis not present

## 2022-04-20 DIAGNOSIS — I4821 Permanent atrial fibrillation: Secondary | ICD-10-CM | POA: Diagnosis not present

## 2022-04-20 NOTE — Telephone Encounter (Signed)
Spoke with Manuela Schwartz at [facility] who states that she faxed over office visit notes and lab work from patient's visit with Dr. Sabra Heck on 1/5. She states that Dr. Sabra Heck had concerns about the patient's weight gain and BNP level. Weight went up from 201 on 02/21/22 to 214 on 04/07/22. She is unsure whether the patient has had any shortness of breath. Advised that we were made aware on 1/5 of these concerns and patient has since been seen by our Heart failure clinic who did not recommend any changes to his diuretics. Office visit note from PCP on 1/5 has been received and uploaded into patient's chart under media.

## 2022-04-20 NOTE — Telephone Encounter (Signed)
Pt c/o medication issue:  1. Name of Medication:  torsemide (DEMADEX) 20 MG tablet  2. How are you currently taking this medication (dosage and times per day)?   3. Are you having a reaction (difficulty breathing--STAT)?   4. What is your medication issue?   Gregory [name] with [facility] is requesting to speak with Dr. Hassell Done nurse. She states Dr. Sabra Heck saw the patient at their office and they plan to fax office visit notes and labs to the office. She states BMP was elevated and patient has gained weight. She would like to know if Dr. Irish Lack thinks Torsemide should be increased since Spironolactone was stopped.

## 2022-04-24 NOTE — Telephone Encounter (Signed)
Manuela Schwartz called to follow-up on recommendation for patient.

## 2022-04-25 ENCOUNTER — Telehealth: Payer: Self-pay

## 2022-04-25 DIAGNOSIS — M2500 Hemarthrosis, unspecified joint: Secondary | ICD-10-CM | POA: Diagnosis not present

## 2022-04-25 DIAGNOSIS — M25561 Pain in right knee: Secondary | ICD-10-CM | POA: Diagnosis not present

## 2022-04-25 DIAGNOSIS — M25461 Effusion, right knee: Secondary | ICD-10-CM | POA: Diagnosis not present

## 2022-04-25 DIAGNOSIS — M25069 Hemarthrosis, unspecified knee: Secondary | ICD-10-CM | POA: Diagnosis not present

## 2022-04-25 NOTE — Telephone Encounter (Signed)
Claiborne Billings from [employer] called to request MD call Dr. Yetta Numbers to discuss pt's upcoming bilateral knee arthroplasty. MD has been made aware.

## 2022-04-28 ENCOUNTER — Telehealth: Payer: Self-pay | Admitting: Interventional Cardiology

## 2022-04-28 NOTE — Telephone Encounter (Signed)
Left voicemail to call the clinic on 1/26 @ 1355.

## 2022-04-28 NOTE — Telephone Encounter (Signed)
Patient is returning call.  °

## 2022-04-28 NOTE — Telephone Encounter (Signed)
Attempted phone call to pt.  No answer and no voicemail.

## 2022-04-28 NOTE — Telephone Encounter (Signed)
Calling to f/u on call from 04/20/22. Still awaiting a callback from nurse. Please advise

## 2022-05-01 NOTE — Telephone Encounter (Signed)
Called patient no answer, called Manuela Schwartz with [facility] she wanted to get an update on what recommendations if any should for the patient. Will forward the message to the MD and RN.

## 2022-05-01 NOTE — Telephone Encounter (Signed)
The patient has been notified of the result and verbalized understanding.  All questions (if any) were answered. Gershon Crane, LPN 0/01/2724 3:66 PM

## 2022-05-01 NOTE — Telephone Encounter (Signed)
Gregory [name] from [facility] is returning call and is requesting call back.

## 2022-05-04 NOTE — Telephone Encounter (Signed)
Attempted to call patient to clarify, but unable to reach him. Looks like this was already addresed and completed via the 04/20/22 phone note. Will close this encounter. No changes or new recommendations made per Dr Irish Lack.

## 2022-05-04 NOTE — Telephone Encounter (Signed)
I spoke with patient. He has no questions for Dr Irish Lack at this time.  Appointment made for patient to see Dr Irish Lack on April 15 for 6 month follow up.

## 2022-05-04 NOTE — Telephone Encounter (Signed)
Call placed to patient.  Message left to call office

## 2022-05-04 NOTE — Telephone Encounter (Signed)
Patient returning call.

## 2022-05-09 DIAGNOSIS — E119 Type 2 diabetes mellitus without complications: Secondary | ICD-10-CM | POA: Diagnosis not present

## 2022-05-16 DIAGNOSIS — I10 Essential (primary) hypertension: Secondary | ICD-10-CM | POA: Diagnosis not present

## 2022-05-16 DIAGNOSIS — E1140 Type 2 diabetes mellitus with diabetic neuropathy, unspecified: Secondary | ICD-10-CM | POA: Diagnosis not present

## 2022-05-16 DIAGNOSIS — I5032 Chronic diastolic (congestive) heart failure: Secondary | ICD-10-CM | POA: Diagnosis not present

## 2022-05-16 DIAGNOSIS — E7800 Pure hypercholesterolemia, unspecified: Secondary | ICD-10-CM | POA: Diagnosis not present

## 2022-05-16 DIAGNOSIS — N400 Enlarged prostate without lower urinary tract symptoms: Secondary | ICD-10-CM | POA: Diagnosis not present

## 2022-05-16 DIAGNOSIS — I4821 Permanent atrial fibrillation: Secondary | ICD-10-CM | POA: Diagnosis not present

## 2022-05-16 DIAGNOSIS — M1711 Unilateral primary osteoarthritis, right knee: Secondary | ICD-10-CM | POA: Diagnosis not present

## 2022-05-17 DIAGNOSIS — E669 Obesity, unspecified: Secondary | ICD-10-CM | POA: Diagnosis not present

## 2022-05-17 DIAGNOSIS — I110 Hypertensive heart disease with heart failure: Secondary | ICD-10-CM | POA: Diagnosis not present

## 2022-05-17 DIAGNOSIS — M1990 Unspecified osteoarthritis, unspecified site: Secondary | ICD-10-CM | POA: Diagnosis not present

## 2022-05-17 DIAGNOSIS — R2689 Other abnormalities of gait and mobility: Secondary | ICD-10-CM | POA: Diagnosis not present

## 2022-05-25 DIAGNOSIS — Z7901 Long term (current) use of anticoagulants: Secondary | ICD-10-CM | POA: Diagnosis not present

## 2022-06-09 DIAGNOSIS — E7800 Pure hypercholesterolemia, unspecified: Secondary | ICD-10-CM | POA: Diagnosis not present

## 2022-06-09 DIAGNOSIS — I5032 Chronic diastolic (congestive) heart failure: Secondary | ICD-10-CM | POA: Diagnosis not present

## 2022-06-09 DIAGNOSIS — I10 Essential (primary) hypertension: Secondary | ICD-10-CM | POA: Diagnosis not present

## 2022-06-09 DIAGNOSIS — N400 Enlarged prostate without lower urinary tract symptoms: Secondary | ICD-10-CM | POA: Diagnosis not present

## 2022-06-09 DIAGNOSIS — E1140 Type 2 diabetes mellitus with diabetic neuropathy, unspecified: Secondary | ICD-10-CM | POA: Diagnosis not present

## 2022-06-09 DIAGNOSIS — M1711 Unilateral primary osteoarthritis, right knee: Secondary | ICD-10-CM | POA: Diagnosis not present

## 2022-06-09 DIAGNOSIS — I4821 Permanent atrial fibrillation: Secondary | ICD-10-CM | POA: Diagnosis not present

## 2022-06-15 ENCOUNTER — Ambulatory Visit: Payer: PPO

## 2022-06-15 DIAGNOSIS — R2689 Other abnormalities of gait and mobility: Secondary | ICD-10-CM | POA: Diagnosis not present

## 2022-06-15 DIAGNOSIS — E669 Obesity, unspecified: Secondary | ICD-10-CM | POA: Diagnosis not present

## 2022-06-15 DIAGNOSIS — I442 Atrioventricular block, complete: Secondary | ICD-10-CM | POA: Diagnosis not present

## 2022-06-15 DIAGNOSIS — I110 Hypertensive heart disease with heart failure: Secondary | ICD-10-CM | POA: Diagnosis not present

## 2022-06-15 DIAGNOSIS — M1990 Unspecified osteoarthritis, unspecified site: Secondary | ICD-10-CM | POA: Diagnosis not present

## 2022-06-16 LAB — CUP PACEART REMOTE DEVICE CHECK
Battery Remaining Longevity: 111 mo
Battery Voltage: 3.01 V
Brady Statistic RV Percent Paced: 94.84 %
Date Time Interrogation Session: 20240314070957
Implantable Lead Connection Status: 753985
Implantable Lead Implant Date: 20210616
Implantable Lead Location: 753860
Implantable Lead Model: 5076
Implantable Pulse Generator Implant Date: 20210616
Lead Channel Impedance Value: 437 Ohm
Lead Channel Impedance Value: 513 Ohm
Lead Channel Pacing Threshold Amplitude: 0.5 V
Lead Channel Pacing Threshold Pulse Width: 0.4 ms
Lead Channel Sensing Intrinsic Amplitude: 10 mV
Lead Channel Sensing Intrinsic Amplitude: 10 mV
Lead Channel Setting Pacing Amplitude: 2.5 V
Lead Channel Setting Pacing Pulse Width: 0.4 ms
Lead Channel Setting Sensing Sensitivity: 4 mV
Zone Setting Status: 755011

## 2022-06-23 DIAGNOSIS — I4821 Permanent atrial fibrillation: Secondary | ICD-10-CM | POA: Diagnosis not present

## 2022-06-23 DIAGNOSIS — Z7901 Long term (current) use of anticoagulants: Secondary | ICD-10-CM | POA: Diagnosis not present

## 2022-07-16 DIAGNOSIS — E669 Obesity, unspecified: Secondary | ICD-10-CM | POA: Diagnosis not present

## 2022-07-16 DIAGNOSIS — I110 Hypertensive heart disease with heart failure: Secondary | ICD-10-CM | POA: Diagnosis not present

## 2022-07-16 DIAGNOSIS — M1990 Unspecified osteoarthritis, unspecified site: Secondary | ICD-10-CM | POA: Diagnosis not present

## 2022-07-16 DIAGNOSIS — R2689 Other abnormalities of gait and mobility: Secondary | ICD-10-CM | POA: Diagnosis not present

## 2022-07-16 NOTE — Progress Notes (Unsigned)
Cardiology Office Note   Date:  07/17/2022   ID:  Gregory [name], DOB April 06, 1940, MRN [id number]  PCP:  Sigmund Hazel, MD    No chief complaint on file.  Chronic diastolic heart failure/status post TAVR  Wt Readings from Last 3 Encounters:  07/17/22 209 lb 3.2 oz (94.9 kg)  04/14/22 218 lb (98.9 kg)  03/14/22 196 lb (88.9 kg)       History of Present Illness: Gregory [name] is a 82 y.o. male   with mitral regurgtation and severe AS.    In March 2017, had acute heart failure diastolic, asa diuresed.  He is now 40 lbs lighter than he was in 2/17.  20 lbs of fluid diuresed and then 20 lb weight loss.   He reduced salt in his diet.   Walking limited by knee pain.  Has received knee injections.    His sister in law died from cancer in early 02-Sep-2018.  THis has been a source of stress.    05/2018 echo showed severe AS: Aortic stenosis now severe.  He should watch for chest discomfort, signs of CHF ( DOE, fluid overload), or lightheadedness/syncope./  WOuld need left and right heart cath and then evaluation for SAVR vs. TAVR based on coronary anatomy.     4/21 echo showed: Normal LV function. Calcification of the aortomitral continuity with severe aortic valve  stenosis and moderate mitral valve stesnosis. Findings suggestive of  radiation heart disease.   6. The mitral valve is abnormal. Mild to moderate mitral valve  regurgitation. Moderate mitral stenosis. The mean mitral valve gradient is 6.0 mmHg with average heart rate of 74 bpm.   7. The aortic valve is abnormal. Aortic valve regurgitation is not  visualized. Severe aortic valve stenosis. Aortic valve mean gradient  measures 44.0 mmHg.   8. Aortic dilatation noted. There is mild dilatation of the ascending  aorta measuring 41 mm.    In 09/02/19, he had cath showing no severe CAD.  He then had TAVR followed by pacemaker in 09-02-19.  Pacer on the right side of chest after there was too much scar tissue on the left side.       Last  hospitalized in July 2023 for heart failure.  Went to rehab.    Gained weight at the end of 2021-09-01.  His nieces came to the visit in October 2023 with me.  At that time, we stopped potassium supplements since he was on losartan and spironolactone.   In 09/02/22, noted that he urinates a lot with 20 mg torsemide so he decreased from 40 mg daily on his own.   Has lost weight since Jan 2024.  Walking limited by right knee pain.  Not a candidate for TKR as of 09/02/22 due to some PAD, DM as well. PTA was considered.  Had fluid in his knee that was drained.     Past Medical History:  Diagnosis Date   Amputation finger    Distal 4th & 5th digits of right hand   Arthritis    Chronic atrial fibrillation    Chronic diastolic CHF (congestive heart failure)    Diabetes mellitus type 2, noninsulin dependent    History of blood transfusion    5 pints for peptic ulcer   History of kidney stones    Passed x 2   Hypertension    Mitral valve disease    mild-moderate MR, moderate MS 07/15/19 echo   OSA (obstructive sleep apnea)  Other malignant lymphomas, unspecified site, extranodal and solid organ sites    Non Hodkins Lymothopathy   Ruptured disk    S/P TAVR (transcatheter aortic valve replacement) 09/16/2019   s/p TAVR with a 29 mm Edwards Sapien 3 THV via the TF approach by Dr. Excell Seltzer and Dr. Laneta Simmers   Severe aortic stenosis    Ulcer    peptic Ulcer - bleeding    Past Surgical History:  Procedure Laterality Date   APPENDECTOMY  1967   COLONOSCOPY     left knee surgery     left shoulder surgery     MULTIPLE EXTRACTIONS WITH ALVEOLOPLASTY N/A 09/04/2019   Procedure: Extraction of tooth #'s 2,7,8,9,11,13,14 20,30, and 31 with alveoloplatsty and gross debridement of remaining teeth;  Surgeon: Charlynne Pander, DDS;  Location: [facility] OR;  Service: Oral Surgery;  Laterality: N/A;   PACEMAKER IMPLANT N/A 09/17/2019   Procedure: PACEMAKER IMPLANT;  Surgeon: Regan Lemming, MD;  Location: [facility] INVASIVE  CV LAB;  Service: Cardiovascular;  Laterality: N/A;   RIGHT/LEFT HEART CATH AND CORONARY ANGIOGRAPHY N/A 08/12/2019   Procedure: RIGHT/LEFT HEART CATH AND CORONARY ANGIOGRAPHY;  Surgeon: Corky Crafts, MD;  Location: [facility] INVASIVE CV LAB;  Service: Cardiovascular;  Laterality: N/A;   TEE WITHOUT CARDIOVERSION N/A 09/16/2019   Procedure: TRANSESOPHAGEAL ECHOCARDIOGRAM (TEE);  Surgeon: Tonny Bollman, MD;  Location: [facility] INVASIVE CV LAB;  Service: Open Heart Surgery;  Laterality: N/A;   TONSILLECTOMY     TRANSCATHETER AORTIC VALVE REPLACEMENT, TRANSFEMORAL N/A 09/16/2019   Procedure: TRANSCATHETER AORTIC VALVE REPLACEMENT, TRANSFEMORAL;  Surgeon: Tonny Bollman, MD;  Location: [facility] INVASIVE CV LAB;  Service: Open Heart Surgery;  Laterality: N/A;     Current Outpatient Medications  Medication Sig Dispense Refill   acetaminophen (TYLENOL) 650 MG CR tablet Take 650 mg by mouth every 8 (eight) hours as needed for pain.     atorvastatin (LIPITOR) 20 MG tablet Take 20 mg by mouth daily.     Cholecalciferol 50 MCG (2000 UT) TABS 1 tablet Orally Once a day for 30 day(s)     cyanocobalamin 1000 MCG tablet Take 1,000 mcg by mouth daily.     dapagliflozin propanediol (FARXIGA) 10 MG TABS tablet Take 1 tablet (10 mg total) by mouth daily. 30 tablet    digoxin (LANOXIN) 0.125 MG tablet Take 0.5 tablets (0.0625 mg total) by mouth daily. 45 tablet 3   feeding supplement (ENSURE ENLIVE / ENSURE PLUS) LIQD Take 237 mLs by mouth 2 (two) times daily between meals. 237 mL 12   ferrous sulfate 325 (65 FE) MG tablet 1 tablet Orally Once a day     gabapentin (NEURONTIN) 300 MG capsule daily.     ketoconazole (NIZORAL) 2 % cream Apply 1 Application topically 2 (two) times daily as needed for irritation.     losartan (COZAAR) 25 MG tablet Take 1 tablet (25 mg total) by mouth daily. 90 tablet 3   metFORMIN (GLUCOPHAGE-XR) 500 MG 24 hr tablet Take 1 tablet (500 mg total) by mouth 2 (two) times daily.     metoprolol  succinate (TOPROL-XL) 50 MG 24 hr tablet Take 1 tablet (50 mg total) by mouth at bedtime. Take with or immediately following a meal.     Multiple Vitamins-Minerals (PRESERVISION AREDS 2 PO) Take 1 tablet by mouth daily.     ONE TOUCH ULTRA TEST test strip 1 each by Other route as needed (BLOOD SUGAR).      ONETOUCH DELICA LANCETS 33G MISC Apply 1 each topically  daily as needed (BLOOD SUGAR).      senna-docusate (SENOKOT-S) 8.6-50 MG tablet Take 1 tablet by mouth at bedtime as needed for mild constipation.     tamsulosin (FLOMAX) 0.4 MG CAPS capsule Take 0.4 mg by mouth daily.     warfarin (COUMADIN) 6 MG tablet Take 6 mg by mouth daily.     torsemide (DEMADEX) 20 MG tablet Take 2 tablets (40 mg total) by mouth daily. (Patient not taking: Reported on 07/17/2022) 180 tablet 3   No current facility-administered medications for this visit.    Allergies:   Aspirin, Macrobid [nitrofurantoin], Nystatin, Prevnar 13 [pneumococcal 13-val conj vacc], Spironolactone, Bisoprolol-hydrochlorothiazide, and Nsaids    Social History:  The patient  reports that he has never smoked. He has never used smokeless tobacco. He reports that he does not drink alcohol and does not use drugs.   Family History:  The patient's family history includes Cancer in his mother; Heart attack in his father and mother; Hypertension in his father.    ROS:  Please see the history of present illness.   Otherwise, review of systems are positive for intentional weight loss.   All other systems are reviewed and negative.    PHYSICAL EXAM: VS:  BP (!) 120/56   Pulse 73   Ht  (1.778 m)   Wt 209 lb 3.2 oz (94.9 kg)   SpO2 96%   BMI 30.02 kg/m  , BMI Body mass index is 30.02 kg/m. GEN: Well nourished, well developed, in no acute distress HEENT: normal Neck: no JVD, carotid bruits, or masses Cardiac: RRR; 2/6 systolic murmur, no rubs, or gallops, tr bilateral LE edema  Respiratory:  clear to auscultation bilaterally, normal  work of breathing GI: soft, nontender, nondistended, + BS MS: no deformity or atrophy Skin: warm and dry, no rash Neuro:  Strength and sensation are intact Psych: euthymic mood, full affect    Recent Labs: 09/27/2021: ALT 19; B Natriuretic Peptide 354.5 09/29/2021: Magnesium 2.0 10/04/2021: Hemoglobin 10.3; Platelets 156 04/14/2022: BUN 28; Creatinine, Ser 0.97; Potassium 4.2; Sodium 133   Lipid Panel No results found for: "CHOL", "TRIG", "HDL", "CHOLHDL", "VLDL", "LDLCALC", "LDLDIRECT"   Other studies Reviewed: Additional studies/ records that were reviewed today with results demonstrating: Labs reviewed: LDL 62 in 10/23. A1C 6.4%.   ASSESSMENT AND PLAN:  Aortic stenosis: Status post TAVR.  He needs SBE prophylaxis for dental work.  He was considering knee surgery as well but appears to not be a candidate.  Still has his lower teeth. Needs routine dental cleanings. Mitral stenosis.  He has been seen by the structural team.  Plan for conservative therapy unless there were recurrent heart failure episodes. No CHF.  Chronic diastolic heart failure: Torsemide for diuresis.  He did see heart failure clinic, but will f/u here. Hypertensive heart disease: The current medical regimen is effective;  continue present plan and medications. Hyperlipidemia: COntinue atorvastatin.  Whole food plant-based diet.High fiber diet.  Avoid processed foods.    Current medicines are reviewed at length with the patient today.  The patient concerns regarding his medicines were addressed.  The following changes have been made:  No change  Labs/ tests ordered today include: CBC, BMet, Dig level, INR; INR result to be sent to Dr. Hyacinth Meeker. No orders of the defined types were placed in this encounter.   Recommend 150 minutes/week of aerobic exercise Low fat, low carb, high fiber diet recommended  Disposition:   FU in 6 months   Signed,  Lance Muss, MD  07/17/2022 12:15 PM    [facility] [address] [place], [place], [place]  [address] Phone: [phone]; Fax: [phone]

## 2022-07-17 ENCOUNTER — Ambulatory Visit: Payer: PPO | Attending: Interventional Cardiology | Admitting: Interventional Cardiology

## 2022-07-17 ENCOUNTER — Encounter: Payer: Self-pay | Admitting: Interventional Cardiology

## 2022-07-17 VITALS — BP 120/56 | HR 73 | Ht 70.0 in | Wt 209.2 lb

## 2022-07-17 DIAGNOSIS — Z7901 Long term (current) use of anticoagulants: Secondary | ICD-10-CM

## 2022-07-17 DIAGNOSIS — I4821 Permanent atrial fibrillation: Secondary | ICD-10-CM | POA: Diagnosis not present

## 2022-07-17 DIAGNOSIS — Z79899 Other long term (current) drug therapy: Secondary | ICD-10-CM | POA: Diagnosis not present

## 2022-07-17 DIAGNOSIS — I5032 Chronic diastolic (congestive) heart failure: Secondary | ICD-10-CM | POA: Diagnosis not present

## 2022-07-17 LAB — CBC
Hematocrit: 38.1 % (ref 37.5–51.0)
Hemoglobin: 12.8 g/dL — ABNORMAL LOW (ref 13.0–17.7)
MCH: 30.7 pg (ref 26.6–33.0)
MCHC: 33.6 g/dL (ref 31.5–35.7)
Platelets: 200 10*3/uL (ref 150–450)
RBC: 4.17 x10E6/uL (ref 4.14–5.80)

## 2022-07-17 NOTE — Patient Instructions (Signed)
Medication Instructions:  Your physician recommends that you continue on your current medications as directed. Please refer to the Current Medication list given to you today.  *If you need a refill on your cardiac medications before your next appointment, please call your pharmacy*   Lab Work: Lab work to be done today--BMP, Digoxin level, CBC, PT/INR If you have labs (blood work) drawn today and your tests are completely normal, you will receive your results only by: MyChart Message (if you have MyChart) OR A paper copy in the mail If you have any lab test that is abnormal or we need to change your treatment, we will call you to review the results.   Testing/Procedures: none   Follow-Up: At [facility], you and your health needs are our priority.  As part of our continuing mission to provide you with exceptional heart care, we have created designated Provider Care Teams.  These Care Teams include your primary Cardiologist (physician) and Advanced Practice Providers (APPs -  Physician Assistants and Nurse Practitioners) who all work together to provide you with the care you need, when you need it.  We recommend signing up for the patient portal called "MyChart".  Sign up information is provided on this After Visit Summary.  MyChart is used to connect with patients for Virtual Visits (Telemedicine).  Patients are able to view lab/test results, encounter notes, upcoming appointments, etc.  Non-urgent messages can be sent to your provider as well.   To learn more about what you can do with MyChart, go to [email or web address].    Your next appointment:   6 month(s)  Provider:   Lance Muss, MD

## 2022-07-18 ENCOUNTER — Telehealth: Payer: Self-pay | Admitting: Interventional Cardiology

## 2022-07-18 DIAGNOSIS — I4821 Permanent atrial fibrillation: Secondary | ICD-10-CM | POA: Diagnosis not present

## 2022-07-18 DIAGNOSIS — E1140 Type 2 diabetes mellitus with diabetic neuropathy, unspecified: Secondary | ICD-10-CM | POA: Diagnosis not present

## 2022-07-18 DIAGNOSIS — E7800 Pure hypercholesterolemia, unspecified: Secondary | ICD-10-CM | POA: Diagnosis not present

## 2022-07-18 DIAGNOSIS — I5032 Chronic diastolic (congestive) heart failure: Secondary | ICD-10-CM | POA: Diagnosis not present

## 2022-07-18 DIAGNOSIS — I10 Essential (primary) hypertension: Secondary | ICD-10-CM | POA: Diagnosis not present

## 2022-07-18 LAB — CBC
MCV: 91 fL (ref 79–97)
RDW: 13.5 % (ref 11.6–15.4)
WBC: 9.3 10*3/uL (ref 3.4–10.8)

## 2022-07-18 LAB — BASIC METABOLIC PANEL
BUN/Creatinine Ratio: 25 — ABNORMAL HIGH (ref 10–24)
BUN: 21 mg/dL (ref 8–27)
CO2: 22 mmol/L (ref 20–29)
Calcium: 8.8 mg/dL (ref 8.6–10.2)
Chloride: 100 mmol/L (ref 96–106)
Creatinine, Ser: 0.83 mg/dL (ref 0.76–1.27)
Glucose: 107 mg/dL — ABNORMAL HIGH (ref 70–99)
Potassium: 4.8 mmol/L (ref 3.5–5.2)
Sodium: 137 mmol/L (ref 134–144)
eGFR: 87 mL/min/{1.73_m2} (ref >59)

## 2022-07-18 LAB — DIGOXIN LEVEL: Digoxin, Serum: 0.4 ng/mL — ABNORMAL LOW (ref 0.5–0.9)

## 2022-07-18 LAB — PROTIME-INR
INR: 1.8 — ABNORMAL HIGH (ref 0.9–1.2)
Prothrombin Time: 18.8 s — ABNORMAL HIGH (ref 9.1–12.0)

## 2022-07-18 NOTE — Telephone Encounter (Signed)
Patient is returning call about results. Please advise

## 2022-07-18 NOTE — Telephone Encounter (Signed)
?  Pt is returning call to get result ?

## 2022-07-18 NOTE — Telephone Encounter (Signed)
Results reviewed with patient's niece.  They will contact PCP to follow up on coumadin dosing

## 2022-07-18 NOTE — Telephone Encounter (Signed)
Results reviewed with patient.  He will contact Dr Rondel Baton office to follow up on coumadin dosing.

## 2022-07-18 NOTE — Progress Notes (Signed)
Remote pacemaker transmission.   

## 2022-08-03 DIAGNOSIS — Z7901 Long term (current) use of anticoagulants: Secondary | ICD-10-CM | POA: Diagnosis not present

## 2022-08-15 DIAGNOSIS — I110 Hypertensive heart disease with heart failure: Secondary | ICD-10-CM | POA: Diagnosis not present

## 2022-08-15 DIAGNOSIS — M1990 Unspecified osteoarthritis, unspecified site: Secondary | ICD-10-CM | POA: Diagnosis not present

## 2022-08-15 DIAGNOSIS — R2689 Other abnormalities of gait and mobility: Secondary | ICD-10-CM | POA: Diagnosis not present

## 2022-08-15 DIAGNOSIS — E669 Obesity, unspecified: Secondary | ICD-10-CM | POA: Diagnosis not present

## 2022-08-25 ENCOUNTER — Telehealth: Payer: Self-pay

## 2022-08-25 DIAGNOSIS — Z961 Presence of intraocular lens: Secondary | ICD-10-CM | POA: Diagnosis not present

## 2022-08-25 DIAGNOSIS — H2511 Age-related nuclear cataract, right eye: Secondary | ICD-10-CM | POA: Diagnosis not present

## 2022-08-25 DIAGNOSIS — H2512 Age-related nuclear cataract, left eye: Secondary | ICD-10-CM | POA: Diagnosis not present

## 2022-08-25 DIAGNOSIS — H25013 Cortical age-related cataract, bilateral: Secondary | ICD-10-CM | POA: Diagnosis not present

## 2022-08-25 DIAGNOSIS — H2513 Age-related nuclear cataract, bilateral: Secondary | ICD-10-CM | POA: Diagnosis not present

## 2022-08-25 DIAGNOSIS — H18413 Arcus senilis, bilateral: Secondary | ICD-10-CM | POA: Diagnosis not present

## 2022-08-25 NOTE — Telephone Encounter (Signed)
   Pre-operative Risk Assessment    Patient Name: Gregory [name]  DOB: [birth year] MRN: [id number]      Request for Surgical Clearance    Procedure:   Cataract extraction with intraocular lens implantation of the right and left eye  Date of Surgery:  Clearance 11/20/22                                 Surgeon:  Dr. Mia Creek Surgeon's Group or Practice Name:  [facility] Phone number:  [phone] Fax number:  [phone]   Type of Clearance Requested:   - Medical  - Pharmacy:  Hold Warfarin (Coumadin) pt will need instructions on when to hold   Type of Anesthesia:  Not Indicated   Additional requests/questions:    Wynetta Fines   08/25/2022, 1:42 PM

## 2022-08-25 NOTE — Telephone Encounter (Signed)
   Patient Name: [name]  DOB: 05-17-1940 MRN: [id number]  Primary Cardiologist: Lance Muss, MD  Chart reviewed as part of pre-operative protocol coverage. Cataract extractions are recognized in guidelines as low risk surgeries that do not typically require specific preoperative testing or holding of blood thinner therapy. Therefore, given past medical history and time since last visit, based on ACC/AHA guidelines, Gregory [name] would be at acceptable risk for the planned procedure without further cardiovascular testing.   I will route this recommendation to the requesting party via Epic fax function and remove from pre-op pool.  Please call with questions.  Napoleon Form, Leodis Rains, NP 08/25/2022, 1:49 PM

## 2022-09-01 DIAGNOSIS — Z7901 Long term (current) use of anticoagulants: Secondary | ICD-10-CM | POA: Diagnosis not present

## 2022-09-01 DIAGNOSIS — I4821 Permanent atrial fibrillation: Secondary | ICD-10-CM | POA: Diagnosis not present

## 2022-09-08 DIAGNOSIS — I4821 Permanent atrial fibrillation: Secondary | ICD-10-CM | POA: Diagnosis not present

## 2022-09-08 DIAGNOSIS — Z6830 Body mass index (BMI) 30.0-30.9, adult: Secondary | ICD-10-CM | POA: Diagnosis not present

## 2022-09-08 DIAGNOSIS — E1151 Type 2 diabetes mellitus with diabetic peripheral angiopathy without gangrene: Secondary | ICD-10-CM | POA: Diagnosis not present

## 2022-09-08 DIAGNOSIS — Z950 Presence of cardiac pacemaker: Secondary | ICD-10-CM | POA: Diagnosis not present

## 2022-09-08 DIAGNOSIS — M1711 Unilateral primary osteoarthritis, right knee: Secondary | ICD-10-CM | POA: Diagnosis not present

## 2022-09-08 DIAGNOSIS — D6869 Other thrombophilia: Secondary | ICD-10-CM | POA: Diagnosis not present

## 2022-09-08 DIAGNOSIS — I442 Atrioventricular block, complete: Secondary | ICD-10-CM | POA: Diagnosis not present

## 2022-09-08 DIAGNOSIS — Z7901 Long term (current) use of anticoagulants: Secondary | ICD-10-CM | POA: Diagnosis not present

## 2022-09-08 DIAGNOSIS — D692 Other nonthrombocytopenic purpura: Secondary | ICD-10-CM | POA: Diagnosis not present

## 2022-09-08 DIAGNOSIS — I77810 Thoracic aortic ectasia: Secondary | ICD-10-CM | POA: Diagnosis not present

## 2022-09-08 DIAGNOSIS — I700 Atherosclerosis of aorta: Secondary | ICD-10-CM | POA: Diagnosis not present

## 2022-09-12 ENCOUNTER — Emergency Department (HOSPITAL_BASED_OUTPATIENT_CLINIC_OR_DEPARTMENT_OTHER)
Admission: EM | Admit: 2022-09-12 | Discharge: 2022-09-12 | Disposition: A | Discharge status: Home / Self Care | Payer: PPO | Attending: Emergency Medicine | Admitting: Emergency Medicine

## 2022-09-12 ENCOUNTER — Encounter (HOSPITAL_BASED_OUTPATIENT_CLINIC_OR_DEPARTMENT_OTHER): Payer: Self-pay

## 2022-09-12 ENCOUNTER — Other Ambulatory Visit: Payer: Self-pay

## 2022-09-12 DIAGNOSIS — S51811A Laceration without foreign body of right forearm, initial encounter: Secondary | ICD-10-CM | POA: Diagnosis not present

## 2022-09-12 DIAGNOSIS — W268XXA Contact with other sharp object(s), not elsewhere classified, initial encounter: Secondary | ICD-10-CM | POA: Diagnosis not present

## 2022-09-12 DIAGNOSIS — Z23 Encounter for immunization: Secondary | ICD-10-CM | POA: Diagnosis not present

## 2022-09-12 MED ORDER — TETANUS-DIPHTH-ACELL PERTUSSIS 5-2.5-18.5 LF-MCG/0.5 IM SUSY
0.5000 mL | PREFILLED_SYRINGE | Freq: Once | INTRAMUSCULAR | Status: AC
  Administered 2022-09-12: 0.5 mL via INTRAMUSCULAR
  Filled 2022-09-12: qty 0.5

## 2022-09-12 NOTE — ED Notes (Signed)
Right arm wound cleaned and irrigated. Steri strips applied to wound. Non-stick dressing applied on top of steri strips and wrapped with coban.

## 2022-09-12 NOTE — ED Triage Notes (Signed)
Patient here POV from Home.  Endorses Fall today at 1300. Using [employer] when his Right Knee gave way and caused Patient to fall onto his Right Arm. No Head injury. Takes Warfarin.   NAD Noted during Triage. A&Ox4. GCS 15. BIB Wheelchair.

## 2022-09-12 NOTE — ED Notes (Signed)
Patient verbalizes understanding of discharge instructions. Opportunity for questioning and answers were provided. Patient discharged from ED with instructions to follow up with PCP.

## 2022-09-12 NOTE — ED Provider Notes (Signed)
[place] EMERGENCY DEPARTMENT AT [facility] Provider Note   CSN: [id number] Arrival date & time: 09/12/22  1349     History {Add pertinent medical, surgical, social history, OB history to HPI:1} Chief Complaint  Patient presents with   Gregory [name] is a 82 y.o. male.  82 yo M with a chief complaints of a fall.  The patient was edging his yard and he had a sudden pain in his right knee and dropped to the ground.  Complaining of a wound to his right arm.  Denies other injury.  Denies head injury denies loss consciousness denies neck pain back pain chest pain abdominal pain.   Fall       Home Medications Prior to Admission medications   Medication Sig Start Date End Date Taking? Authorizing Provider  acetaminophen (TYLENOL) 650 MG CR tablet Take 650 mg by mouth every 8 (eight) hours as needed for pain.    [provider]  atorvastatin (LIPITOR) 20 MG tablet Take 20 mg by mouth daily. 04/22/12   [provider]  Cholecalciferol 50 MCG (2000 UT) TABS 1 tablet Orally Once a day for 30 day(s) 04/20/22   [provider]  cyanocobalamin 1000 MCG tablet Take 1,000 mcg by mouth daily.    [provider]  dapagliflozin propanediol (FARXIGA) 10 MG TABS tablet Take 1 tablet (10 mg total) by mouth daily. 10/05/21   Lorin Glass, MD  digoxin (LANOXIN) 0.125 MG tablet Take 0.5 tablets (0.0625 mg total) by mouth daily. 04/14/22   Sabharwal, Aditya, DO  feeding supplement (ENSURE ENLIVE / ENSURE PLUS) LIQD Take 237 mLs by mouth 2 (two) times daily between meals. 10/04/21   Lorin Glass, MD  ferrous sulfate 325 (65 FE) MG tablet 1 tablet Orally Once a day 12/15/19   [provider]  gabapentin (NEURONTIN) 300 MG capsule daily. 11/23/21   [provider]  ketoconazole (NIZORAL) 2 % cream Apply 1 Application topically 2 (two) times daily as needed for irritation.    [provider]  losartan (COZAAR) 25 MG tablet Take 1  tablet (25 mg total) by mouth daily. 03/14/22   Camnitz, Andree Coss, MD  metFORMIN (GLUCOPHAGE-XR) 500 MG 24 hr tablet Take 1 tablet (500 mg total) by mouth 2 (two) times daily. 08/14/19   Corky Crafts, MD  metoprolol succinate (TOPROL-XL) 50 MG 24 hr tablet Take 1 tablet (50 mg total) by mouth at bedtime. Take with or immediately following a meal. 10/04/21   Dahal, Melina Schools, MD  Multiple Vitamins-Minerals (PRESERVISION AREDS 2 PO) Take 1 tablet by mouth daily.    [provider]  ONE TOUCH ULTRA TEST test strip 1 each by Other route as needed (BLOOD SUGAR).  07/14/15   [provider]  Dola Argyle LANCETS 33G MISC Apply 1 each topically daily as needed (BLOOD SUGAR).  07/14/15   [provider]  senna-docusate (SENOKOT-S) 8.6-50 MG tablet Take 1 tablet by mouth at bedtime as needed for mild constipation. 10/04/21   Lorin Glass, MD  tamsulosin (FLOMAX) 0.4 MG CAPS capsule Take 0.4 mg by mouth daily. 09/16/20   [provider]  torsemide (DEMADEX) 20 MG tablet Take 2 tablets (40 mg total) by mouth daily. Patient not taking: Reported on 07/17/2022 01/13/22   Corky Crafts, MD  warfarin (COUMADIN) 6 MG tablet Take 6 mg by mouth daily. 12/19/21   [provider]      Allergies    Aspirin, Macrobid [nitrofurantoin],  Nystatin, Prevnar 13 [pneumococcal 13-val conj vacc], Spironolactone, Bisoprolol-hydrochlorothiazide, and Nsaids    Review of Systems   Review of Systems  Physical Exam Updated Vital Signs BP (!) 135/53 (BP Location: Right Arm)   Pulse 82   Temp 97.6 F (36.4 C) (Temporal)   Resp 18   Ht 5\' 10"  (1.778 m)   Wt 98.9 kg   SpO2 95%   BMI 31.28 kg/m  Physical Exam Vitals and nursing note reviewed.  Constitutional:      Appearance: He is well-developed.  HENT:     Head: Normocephalic and atraumatic.  Eyes:     Pupils: Pupils are equal, round, and reactive to light.  Neck:     Vascular: No JVD.  Cardiovascular:     Rate  and Rhythm: Normal rate and regular rhythm.     Heart sounds: No murmur heard.    No friction rub. No gallop.  Pulmonary:     Effort: No respiratory distress.     Breath sounds: No wheezing.  Abdominal:     General: There is no distension.     Tenderness: There is no abdominal tenderness. There is no guarding or rebound.  Musculoskeletal:        General: Normal range of motion.     Cervical back: Normal range of motion and neck supple.     Comments: Fairly large skin tear to the dorsal aspect of the right forearm.  No obvious bony tenderness.  Able to supinate pronate without issue.  Palpated from head to toe without any other obvious noted areas of bony tenderness.  Skin:    Coloration: Skin is not pale.     Findings: No rash.  Neurological:     Mental Status: He is alert and oriented to person, place, and time.  Psychiatric:        Behavior: Behavior normal.     ED Results / Procedures / Treatments   Labs (all labs ordered are listed, but only abnormal results are displayed) Labs Reviewed - No data to display  EKG None  Radiology No results found.  Procedures Procedures  {Document cardiac monitor, telemetry assessment procedure when appropriate:1}  Medications Ordered in ED Medications - No data to display  ED Course/ Medical Decision Making/ A&P   {   Click here for ABCD2, HEART and other calculatorsREFRESH Note before signing :1}                          Medical Decision Making Risk Prescription drug management.   82 yo M with a chief complaint of a fall.  Nonsyncopal by history.  Complaining of a skin tear to the right forearm.  Will have nursing Steri-Strip.  Will have him follow-up with his family doctor in the office.  Unable to find immunization records in [facility]  Will update tdap.   {Document critical care time when appropriate:1} {Document review of labs and clinical decision tools ie heart score, Chads2Vasc2 etc:1}  {Document your independent review  of radiology images, and any outside records:1} {Document your discussion with family members, caretakers, and with consultants:1} {Document social determinants of health affecting pt's care:1} {Document your decision making why or why not admission, treatments were needed:1} Final Clinical Impression(s) / ED Diagnoses Final diagnoses:  None    Rx / DC Orders ED Discharge Orders     None

## 2022-09-12 NOTE — Discharge Instructions (Signed)
Return for redness, drainage, fever. Follow up with your doctor in the office.   You can apply an ointment to the area a couple times a day.

## 2022-09-13 DIAGNOSIS — S51819A Laceration without foreign body of unspecified forearm, initial encounter: Secondary | ICD-10-CM | POA: Diagnosis not present

## 2022-09-13 DIAGNOSIS — Z6831 Body mass index (BMI) 31.0-31.9, adult: Secondary | ICD-10-CM | POA: Diagnosis not present

## 2022-09-13 DIAGNOSIS — Z7901 Long term (current) use of anticoagulants: Secondary | ICD-10-CM | POA: Diagnosis not present

## 2022-09-13 DIAGNOSIS — W19XXXA Unspecified fall, initial encounter: Secondary | ICD-10-CM | POA: Diagnosis not present

## 2022-09-14 ENCOUNTER — Ambulatory Visit (INDEPENDENT_AMBULATORY_CARE_PROVIDER_SITE_OTHER): Payer: PPO

## 2022-09-14 DIAGNOSIS — I442 Atrioventricular block, complete: Secondary | ICD-10-CM

## 2022-09-15 DIAGNOSIS — M1990 Unspecified osteoarthritis, unspecified site: Secondary | ICD-10-CM | POA: Diagnosis not present

## 2022-09-15 DIAGNOSIS — I110 Hypertensive heart disease with heart failure: Secondary | ICD-10-CM | POA: Diagnosis not present

## 2022-09-15 DIAGNOSIS — R2689 Other abnormalities of gait and mobility: Secondary | ICD-10-CM | POA: Diagnosis not present

## 2022-09-15 DIAGNOSIS — E669 Obesity, unspecified: Secondary | ICD-10-CM | POA: Diagnosis not present

## 2022-09-15 LAB — CUP PACEART REMOTE DEVICE CHECK
Battery Remaining Longevity: 106 mo
Battery Voltage: 3.01 V
Brady Statistic RV Percent Paced: 94.55 %
Date Time Interrogation Session: 20240613043357
Implantable Lead Connection Status: 753985
Implantable Lead Implant Date: 20210616
Implantable Lead Location: 753860
Implantable Lead Model: 5076
Implantable Pulse Generator Implant Date: 20210616
Lead Channel Impedance Value: 399 Ohm
Lead Channel Impedance Value: 475 Ohm
Lead Channel Pacing Threshold Amplitude: 0.375 V
Lead Channel Pacing Threshold Pulse Width: 0.4 ms
Lead Channel Sensing Intrinsic Amplitude: 8.375 mV
Lead Channel Sensing Intrinsic Amplitude: 8.375 mV
Lead Channel Setting Pacing Amplitude: 2.5 V
Lead Channel Setting Pacing Pulse Width: 0.4 ms
Lead Channel Setting Sensing Sensitivity: 4 mV
Zone Setting Status: 755011

## 2022-09-20 DIAGNOSIS — Z6831 Body mass index (BMI) 31.0-31.9, adult: Secondary | ICD-10-CM | POA: Diagnosis not present

## 2022-09-20 DIAGNOSIS — S51819A Laceration without foreign body of unspecified forearm, initial encounter: Secondary | ICD-10-CM | POA: Diagnosis not present

## 2022-09-20 DIAGNOSIS — W19XXXD Unspecified fall, subsequent encounter: Secondary | ICD-10-CM | POA: Diagnosis not present

## 2022-09-22 DIAGNOSIS — Z7901 Long term (current) use of anticoagulants: Secondary | ICD-10-CM | POA: Diagnosis not present

## 2022-09-22 DIAGNOSIS — I4821 Permanent atrial fibrillation: Secondary | ICD-10-CM | POA: Diagnosis not present

## 2022-09-27 DIAGNOSIS — Z7901 Long term (current) use of anticoagulants: Secondary | ICD-10-CM | POA: Diagnosis not present

## 2022-09-27 DIAGNOSIS — I4821 Permanent atrial fibrillation: Secondary | ICD-10-CM | POA: Diagnosis not present

## 2022-10-09 NOTE — Progress Notes (Signed)
Remote pacemaker transmission.   

## 2022-10-10 DIAGNOSIS — I050 Rheumatic mitral stenosis: Secondary | ICD-10-CM | POA: Diagnosis not present

## 2022-10-10 DIAGNOSIS — I4821 Permanent atrial fibrillation: Secondary | ICD-10-CM | POA: Diagnosis not present

## 2022-10-10 DIAGNOSIS — Z23 Encounter for immunization: Secondary | ICD-10-CM | POA: Diagnosis not present

## 2022-10-10 DIAGNOSIS — I10 Essential (primary) hypertension: Secondary | ICD-10-CM | POA: Diagnosis not present

## 2022-10-10 DIAGNOSIS — E7800 Pure hypercholesterolemia, unspecified: Secondary | ICD-10-CM | POA: Diagnosis not present

## 2022-10-10 DIAGNOSIS — I272 Pulmonary hypertension, unspecified: Secondary | ICD-10-CM | POA: Diagnosis not present

## 2022-10-10 DIAGNOSIS — Z7901 Long term (current) use of anticoagulants: Secondary | ICD-10-CM | POA: Diagnosis not present

## 2022-10-10 DIAGNOSIS — Z9989 Dependence on other enabling machines and devices: Secondary | ICD-10-CM | POA: Diagnosis not present

## 2022-10-10 DIAGNOSIS — E1140 Type 2 diabetes mellitus with diabetic neuropathy, unspecified: Secondary | ICD-10-CM | POA: Diagnosis not present

## 2022-10-15 DIAGNOSIS — R2689 Other abnormalities of gait and mobility: Secondary | ICD-10-CM | POA: Diagnosis not present

## 2022-10-15 DIAGNOSIS — M1990 Unspecified osteoarthritis, unspecified site: Secondary | ICD-10-CM | POA: Diagnosis not present

## 2022-10-15 DIAGNOSIS — E669 Obesity, unspecified: Secondary | ICD-10-CM | POA: Diagnosis not present

## 2022-10-15 DIAGNOSIS — I110 Hypertensive heart disease with heart failure: Secondary | ICD-10-CM | POA: Diagnosis not present

## 2022-11-15 DIAGNOSIS — R2689 Other abnormalities of gait and mobility: Secondary | ICD-10-CM | POA: Diagnosis not present

## 2022-11-15 DIAGNOSIS — I110 Hypertensive heart disease with heart failure: Secondary | ICD-10-CM | POA: Diagnosis not present

## 2022-11-15 DIAGNOSIS — E669 Obesity, unspecified: Secondary | ICD-10-CM | POA: Diagnosis not present

## 2022-11-15 DIAGNOSIS — M1990 Unspecified osteoarthritis, unspecified site: Secondary | ICD-10-CM | POA: Diagnosis not present

## 2022-11-20 DIAGNOSIS — H25811 Combined forms of age-related cataract, right eye: Secondary | ICD-10-CM | POA: Diagnosis not present

## 2022-11-20 DIAGNOSIS — H2511 Age-related nuclear cataract, right eye: Secondary | ICD-10-CM | POA: Diagnosis not present

## 2022-11-21 DIAGNOSIS — H2512 Age-related nuclear cataract, left eye: Secondary | ICD-10-CM | POA: Diagnosis not present

## 2022-11-22 DIAGNOSIS — E1151 Type 2 diabetes mellitus with diabetic peripheral angiopathy without gangrene: Secondary | ICD-10-CM | POA: Diagnosis not present

## 2022-11-22 DIAGNOSIS — Z1331 Encounter for screening for depression: Secondary | ICD-10-CM | POA: Diagnosis not present

## 2022-11-22 DIAGNOSIS — E261 Secondary hyperaldosteronism: Secondary | ICD-10-CM | POA: Diagnosis not present

## 2022-11-22 DIAGNOSIS — I5032 Chronic diastolic (congestive) heart failure: Secondary | ICD-10-CM | POA: Diagnosis not present

## 2022-12-14 ENCOUNTER — Ambulatory Visit (INDEPENDENT_AMBULATORY_CARE_PROVIDER_SITE_OTHER): Payer: PPO

## 2022-12-14 DIAGNOSIS — I442 Atrioventricular block, complete: Secondary | ICD-10-CM | POA: Diagnosis not present

## 2022-12-14 LAB — CUP PACEART REMOTE DEVICE CHECK
Battery Remaining Longevity: 103 mo
Battery Voltage: 3 V
Brady Statistic RV Percent Paced: 97.08 %
Date Time Interrogation Session: 20240912014902
Implantable Lead Connection Status: 753985
Implantable Lead Implant Date: 20210616
Implantable Lead Location: 753860
Implantable Lead Model: 5076
Implantable Pulse Generator Implant Date: 20210616
Lead Channel Impedance Value: 380 Ohm
Lead Channel Impedance Value: 475 Ohm
Lead Channel Pacing Threshold Amplitude: 0.375 V
Lead Channel Pacing Threshold Pulse Width: 0.4 ms
Lead Channel Sensing Intrinsic Amplitude: 7.625 mV
Lead Channel Sensing Intrinsic Amplitude: 7.625 mV
Lead Channel Setting Pacing Amplitude: 2.5 V
Lead Channel Setting Pacing Pulse Width: 0.4 ms
Lead Channel Setting Sensing Sensitivity: 4 mV
Zone Setting Status: 755011

## 2022-12-25 DIAGNOSIS — H2512 Age-related nuclear cataract, left eye: Secondary | ICD-10-CM | POA: Diagnosis not present

## 2022-12-25 DIAGNOSIS — H25812 Combined forms of age-related cataract, left eye: Secondary | ICD-10-CM | POA: Diagnosis not present

## 2022-12-25 NOTE — Progress Notes (Signed)
Remote pacemaker transmission.   

## 2023-01-03 DIAGNOSIS — I4821 Permanent atrial fibrillation: Secondary | ICD-10-CM | POA: Diagnosis not present

## 2023-01-03 DIAGNOSIS — Z7901 Long term (current) use of anticoagulants: Secondary | ICD-10-CM | POA: Diagnosis not present

## 2023-01-30 DIAGNOSIS — I4891 Unspecified atrial fibrillation: Secondary | ICD-10-CM | POA: Diagnosis not present

## 2023-01-30 DIAGNOSIS — E7800 Pure hypercholesterolemia, unspecified: Secondary | ICD-10-CM | POA: Diagnosis not present

## 2023-01-30 DIAGNOSIS — Z7901 Long term (current) use of anticoagulants: Secondary | ICD-10-CM | POA: Diagnosis not present

## 2023-02-07 ENCOUNTER — Ambulatory Visit: Payer: PPO | Attending: Physician Assistant | Admitting: Physician Assistant

## 2023-02-07 ENCOUNTER — Encounter: Payer: Self-pay | Admitting: Physician Assistant

## 2023-02-07 VITALS — BP 101/52 | HR 66 | Ht 70.0 in | Wt 223.0 lb

## 2023-02-07 DIAGNOSIS — I50810 Right heart failure, unspecified: Secondary | ICD-10-CM

## 2023-02-07 DIAGNOSIS — I340 Nonrheumatic mitral (valve) insufficiency: Secondary | ICD-10-CM

## 2023-02-07 DIAGNOSIS — I5032 Chronic diastolic (congestive) heart failure: Secondary | ICD-10-CM | POA: Diagnosis not present

## 2023-02-07 DIAGNOSIS — I10 Essential (primary) hypertension: Secondary | ICD-10-CM

## 2023-02-07 DIAGNOSIS — E785 Hyperlipidemia, unspecified: Secondary | ICD-10-CM

## 2023-02-07 DIAGNOSIS — Z79899 Other long term (current) drug therapy: Secondary | ICD-10-CM | POA: Diagnosis not present

## 2023-02-07 DIAGNOSIS — I050 Rheumatic mitral stenosis: Secondary | ICD-10-CM

## 2023-02-07 MED ORDER — TORSEMIDE 20 MG PO TABS: 20.0000 mg | ORAL_TABLET | ORAL | Status: AC | PRN

## 2023-02-07 NOTE — Patient Instructions (Signed)
Medication Instructions:  Change torsemide to 20 mg as needed. *If you need a refill on your cardiac medications before your next appointment, please call your pharmacy*  Lab Work: None ordered If you have labs (blood work) drawn today and your tests are completely normal, you will receive your results only by: MyChart Message (if you have MyChart) OR A paper copy in the mail If you have any lab test that is abnormal or we need to change your treatment, we will call you to review the results.  Follow-Up: At [facility], you and your health needs are our priority.  As part of our continuing mission to provide you with exceptional heart care, we have created designated Provider Care Teams.  These Care Teams include your primary Cardiologist (physician) and Advanced Practice Providers (APPs -  Physician Assistants and Nurse Practitioners) who all work together to provide you with the care you need, when you need it.  Your next appointment:   6 month(s)  Provider:   Dr Lynnette Caffey Other Instructions 1.Check your blood pressure daily, 1 hr after morning medications for 2 weeks, keep a log and send [place] the readings through mychart at the end of the 2 weeks.   2.Weigh every morning after using the restroom, before breakfast and call and let us know if you have a weight gain of 2 lbs or more overnight or 5 lbs or more in a week.    3.Wrap legs with dressing and ace wrap and use abx ointment.  Low-Sodium Eating Plan Salt (sodium) helps you keep a healthy balance of fluids in your body. Too much sodium can raise your blood pressure. It can also cause fluid and waste to be held in your body. Your health care provider or dietitian may recommend a low-sodium eating plan if you have high blood pressure (hypertension), kidney disease, liver disease, or heart failure. Eating less sodium can help lower your blood pressure and reduce swelling. It can also protect your heart, liver, and kidneys. What  are tips for following this plan? Reading food labels  Check food labels for the amount of sodium per serving. If you eat more than one serving, you must multiply the listed amount by the number of servings. Choose foods with less than 140 milligrams (mg) of sodium per serving. Avoid foods with 300 mg of sodium or more per serving. Always check how much sodium is in a product, even if the label says "unsalted" or "no salt added." Shopping  Buy products labeled as "low-sodium" or "no salt added." Buy fresh foods. Avoid canned foods and pre-made or frozen meals. Avoid canned, cured, or processed meats. Buy breads that have less than 80 mg of sodium per slice. Cooking  Eat more home-cooked food. Try to eat less restaurant, buffet, and fast food. Try not to add salt when you cook. Use salt-free seasonings or herbs instead of table salt or sea salt. Check with your provider or pharmacist before using salt substitutes. Cook with plant-based oils, such as canola, sunflower, or olive oil. Meal planning When eating at a restaurant, ask if your food can be made with less salt or no salt. Avoid dishes labeled as brined, pickled, cured, or smoked. Avoid dishes made with soy sauce, miso, or teriyaki sauce. Avoid foods that have monosodium glutamate (MSG) in them. MSG may be added to some restaurant food, sauces, soups, bouillon, and canned foods. Make meals that can be grilled, baked, poached, roasted, or steamed. These are often made with  less sodium. General information Try to limit your sodium intake to 1,500-2,300 mg each day, or the amount told by your provider. What foods should I eat? Fruits Fresh, frozen, or canned fruit. Fruit juice. Vegetables Fresh or frozen vegetables. "No salt added" canned vegetables. "No salt added" tomato sauce and paste. Low-sodium or reduced-sodium tomato and vegetable juice. Grains Low-sodium cereals, such as oats, puffed wheat and rice, and shredded wheat.  Low-sodium crackers. Unsalted rice. Unsalted pasta. Low-sodium bread. Whole grain breads and whole grain pasta. Meats and other proteins Fresh or frozen meat, poultry, seafood, and fish. These should have no added salt. Low-sodium canned tuna and salmon. Unsalted nuts. Dried peas, beans, and lentils without added salt. Unsalted canned beans. Eggs. Unsalted nut butters. Dairy Milk. Soy milk. Cheese that is naturally low in sodium, such as ricotta cheese, fresh mozzarella, or Swiss cheese. Low-sodium or reduced-sodium cheese. Cream cheese. Yogurt. Seasonings and condiments Fresh and dried herbs and spices. Salt-free seasonings. Low-sodium mustard and ketchup. Sodium-free salad dressing. Sodium-free light mayonnaise. Fresh or refrigerated horseradish. Lemon juice. Vinegar. Other foods Homemade, reduced-sodium, or low-sodium soups. Unsalted popcorn and pretzels. Low-salt or salt-free chips. The items listed above may not be all the foods and drinks you can have. Talk to a dietitian to learn more. What foods should I avoid? Vegetables Sauerkraut, pickled vegetables, and relishes. Olives. [place] fries. Onion rings. Regular canned vegetables, except low-sodium or reduced-sodium items. Regular canned tomato sauce and paste. Regular tomato and vegetable juice. Frozen vegetables in sauces. Grains Instant hot cereals. Bread stuffing, pancake, and biscuit mixes. Croutons. Seasoned rice or pasta mixes. Noodle soup cups. Boxed or frozen macaroni and cheese. Regular salted crackers. Self-rising flour. Meats and other proteins Meat or fish that is salted, canned, smoked, spiced, or pickled. Precooked or cured meat, such as sausages or meat loaves. Tomasa Blase. Ham. Pepperoni. Hot dogs. Corned beef. Chipped beef. Salt pork. Jerky. Pickled herring, anchovies, and sardines. Regular canned tuna. Salted nuts. Dairy Processed cheese and cheese spreads. Hard cheeses. Cheese curds. Blue cheese. Feta cheese. String cheese.  Regular cottage cheese. Buttermilk. Canned milk. Fats and oils Salted butter. Regular margarine. Ghee. Bacon fat. Seasonings and condiments Onion salt, garlic salt, seasoned salt, table salt, and sea salt. Canned and packaged gravies. Worcestershire sauce. Tartar sauce. Barbecue sauce. Teriyaki sauce. Soy sauce, including reduced-sodium soy sauce. Steak sauce. Fish sauce. Oyster sauce. Cocktail sauce. Horseradish that you find on the shelf. Regular ketchup and mustard. Meat flavorings and tenderizers. Bouillon cubes. Hot sauce. Pre-made or packaged marinades. Pre-made or packaged taco seasonings. Relishes. Regular salad dressings. Salsa. Other foods Salted popcorn and pretzels. Corn chips and puffs. Potato and tortilla chips. Canned or dried soups. Pizza. Frozen entrees and pot pies. The items listed above may not be all the foods and drinks you should avoid. Talk to a dietitian to learn more. This information is not intended to replace advice given to you by your health care provider. Make sure you discuss any questions you have with your health care provider. Document Revised: 04/06/2022 Document Reviewed: 04/06/2022 Elsevier Patient Education  2024 [employer] Heart-Healthy Eating Plan Many factors influence your heart health, including eating and exercise habits. Heart health is also called coronary health. Coronary risk increases with abnormal blood fat (lipid) levels. A heart-healthy eating plan includes limiting unhealthy fats, increasing healthy fats, limiting salt (sodium) intake, and making other diet and lifestyle changes. What is my plan? Your health care provider may recommend that: You limit your fat intake to _________%  or less of your total calories each day. You limit your saturated fat intake to _________% or less of your total calories each day. You limit the amount of cholesterol in your diet to less than _________ mg per day. You limit the amount of sodium in your diet to  less than _________ mg per day. What are tips for following this plan? Cooking Cook foods using methods other than frying. Baking, boiling, grilling, and broiling are all good options. Other ways to reduce fat include: Removing the skin from poultry. Removing all visible fats from meats. Steaming vegetables in water or broth. Meal planning  At meals, imagine dividing your plate into fourths: Fill one-half of your plate with vegetables and green salads. Fill one-fourth of your plate with whole grains. Fill one-fourth of your plate with lean protein foods. Eat 2-4 cups of vegetables per day. One cup of vegetables equals 1 cup (91 g) broccoli or cauliflower florets, 2 medium carrots, 1 large bell pepper, 1 large sweet potato, 1 large tomato, 1 medium white potato, 2 cups (150 g) raw leafy greens. Eat 1-2 cups of fruit per day. One cup of fruit equals 1 small apple, 1 large banana, 1 cup (237 g) mixed fruit, 1 large orange,  cup (82 g) dried fruit, 1 cup (240 mL) 100% fruit juice. Eat more foods that contain soluble fiber. Examples include apples, broccoli, carrots, beans, peas, and barley. Aim to get 25-30 g of fiber per day. Increase your consumption of legumes, nuts, and seeds to 4-5 servings per week. One serving of dried beans or legumes equals  cup (90 g) cooked, 1 serving of nuts is  oz (12 almonds, 24 pistachios, or 7 walnut halves), and 1 serving of seeds equals  oz (8 g). Fats Choose healthy fats more often. Choose monounsaturated and polyunsaturated fats, such as olive and canola oils, avocado oil, flaxseeds, walnuts, almonds, and seeds. Eat more omega-3 fats. Choose salmon, mackerel, sardines, tuna, flaxseed oil, and ground flaxseeds. Aim to eat fish at least 2 times each week. Check food labels carefully to identify foods with trans fats or high amounts of saturated fat. Limit saturated fats. These are found in animal products, such as meats, butter, and cream. Plant sources of  saturated fats include palm oil, palm kernel oil, and coconut oil. Avoid foods with partially hydrogenated oils in them. These contain trans fats. Examples are stick margarine, some tub margarines, cookies, crackers, and other baked goods. Avoid fried foods. General information Eat more home-cooked food and less restaurant, buffet, and fast food. Limit or avoid alcohol. Limit foods that are high in added sugar and simple starches such as foods made using white refined flour (white breads, pastries, sweets). Lose weight if you are overweight. Losing just 5-10% of your body weight can help your overall health and prevent diseases such as diabetes and heart disease. Monitor your sodium intake, especially if you have high blood pressure. Talk with your health care provider about your sodium intake. Try to incorporate more vegetarian meals weekly. What foods should I eat? Fruits All fresh, canned (in natural juice), or frozen fruits. Vegetables Fresh or frozen vegetables (raw, steamed, roasted, or grilled). Green salads. Grains Most grains. Choose whole wheat and whole grains most of the time. Rice and pasta, including brown rice and pastas made with whole wheat. Meats and other proteins Lean, well-trimmed beef, veal, pork, and lamb. Chicken and [place] without skin. All fish and shellfish. Wild duck, rabbit, pheasant, and venison. Egg whites  or low-cholesterol egg substitutes. Dried beans, peas, lentils, and tofu. Seeds and most nuts. Dairy Low-fat or nonfat cheeses, including ricotta and mozzarella. Skim or 1% milk (liquid, powdered, or evaporated). Buttermilk made with low-fat milk. Nonfat or low-fat yogurt. Fats and oils Non-hydrogenated (trans-free) margarines. Vegetable oils, including soybean, sesame, sunflower, olive, avocado, peanut, safflower, corn, canola, and cottonseed. Salad dressings or mayonnaise made with a vegetable oil. Beverages Water (mineral or sparkling). Coffee and tea.  Unsweetened ice tea. Diet beverages. Sweets and desserts Sherbet, gelatin, and fruit ice. Small amounts of dark chocolate. Limit all sweets and desserts. Seasonings and condiments All seasonings and condiments. The items listed above may not be a complete list of foods and beverages you can eat. Contact a dietitian for more options. What foods should I avoid? Fruits Canned fruit in heavy syrup. Fruit in cream or butter sauce. Fried fruit. Limit coconut. Vegetables Vegetables cooked in cheese, cream, or butter sauce. Fried vegetables. Grains Breads made with saturated or trans fats, oils, or whole milk. Croissants. Sweet rolls. Donuts. High-fat crackers, such as cheese crackers and chips. Meats and other proteins Fatty meats, such as hot dogs, ribs, sausage, bacon, rib-eye roast or steak. High-fat deli meats, such as salami and bologna. Caviar. Domestic duck and goose. Organ meats, such as liver. Dairy Cream, sour cream, cream cheese, and creamed cottage cheese. Whole-milk cheeses. Whole or 2% milk (liquid, evaporated, or condensed). Whole buttermilk. Cream sauce or high-fat cheese sauce. Whole-milk yogurt. Fats and oils Meat fat, or shortening. Cocoa butter, hydrogenated oils, palm oil, coconut oil, palm kernel oil. Solid fats and shortenings, including bacon fat, salt pork, lard, and butter. Nondairy cream substitutes. Salad dressings with cheese or sour cream. Beverages Regular sodas and any drinks with added sugar. Sweets and desserts Frosting. Pudding. Cookies. Cakes. Pies. Milk chocolate or white chocolate. Buttered syrups. Full-fat ice cream or ice cream drinks. The items listed above may not be a complete list of foods and beverages to avoid. Contact a dietitian for more information. Summary Heart-healthy meal planning includes limiting unhealthy fats, increasing healthy fats, limiting salt (sodium) intake and making other diet and lifestyle changes. Lose weight if you are  overweight. Losing just 5-10% of your body weight can help your overall health and prevent diseases such as diabetes and heart disease. Focus on eating a balance of foods, including fruits and vegetables, low-fat or nonfat dairy, lean protein, nuts and legumes, whole grains, and heart-healthy oils and fats. This information is not intended to replace advice given to you by your health care provider. Make sure you discuss any questions you have with your health care provider. Document Revised: 04/25/2021 Document Reviewed: 04/25/2021 Elsevier Patient Education  2024 [employer].

## 2023-02-07 NOTE — Progress Notes (Signed)
Cardiology Office Note:  .   Date:  02/07/2023  ID:  Gregory [name], DOB 02/27/1941, MRN [id number] PCP: Sigmund Hazel, MD  [facility] Providers Cardiologist:  Lance Muss, MD Electrophysiologist:  Will Jorja Loa, MD {  History of Present Illness: .   Gregory [name] is a 82 y.o. male with a past medical history of mitral regurgitation and severe AS here for follow-up appointment.  History includes acute heart failure, diastolic March 2017.  He was diuresed and started on ASA.  Now 40 pounds lighter than he was before.  He reduced his salt intake as well.  Walking is limited by knee pain for which she receives injections.  05/2018 with echocardiogram showing severe AS and evaluation for SAVR versus TAVR was initiated based on anatomy.  4/21 echocardiogram showed severe aortic stenosis and moderate mitral valve stenosis.  He also had a cardiac catheterization which showed no severe CAD.  He then had a TAVR followed by pacemaker in 2021.  Pacer on the right side of his chest since it was too much scar tissue on the left side.  Was hospitalized July 2023 for heart failure.  Went to rehab afterwards.  Had gained some weight at the end of 2023 at that time stop potassium supplement since he was on losartan and spironolactone.  2024 he noted that he urinates a lot with 20 mg of furosemide so he had decreased it from 40 mg daily on his own.  Lost weight since January 2024.  Walking limited still by some right knee pain.  Not a candidate for TKR.  Had fluid in his knee that was drained.  Today, he presents with a history of heart valve replacement and congestive heart failure with concerns about low blood pressure and leg ulcers. The patient reports feeling fine with no new cardiac symptoms since their last visit in April.  However, he notes that his blood pressure was recorded as 101, which is lower than his usual reading of 123 systolic. The patient admits to not eating or drinking  much on the day of the visit, which could contribute to the low blood pressure. He also reports dry mouth and cracked lips, suggesting possible dehydration. Despite the low blood pressure, the patient denies experiencing any lightheadedness or dizziness.  He also expresses concern about his leg ulcers, which are red and occasionally form painful blisters. The ulcers have been present for a long time and have been attributed to poor venous drainage in the legs. The patient reports that the ulcers are particularly painful at night and sometimes leak fluid. He has been managing the ulcers by washing with soap and water and applying antibiotic ointment. Recommended covering with a clean dressing and wrapping legs with ACE wrap to prevent infection.  Discussed abx use for dental cleaning and dental procedures (mentioned he wants to get dental implants at some point).   The patient also mentions a recent fall that resulted in a laceration to his arm, requiring stitches. He has been using a walker for mobility due to a knee issue that needs replacement. Arm is healing well.   Reports no shortness of breath nor dyspnea on exertion. Reports no chest pain, pressure, or tightness. No edema, orthopnea, PND. Reports no palpitations.    ROS: Pertinent ROS in HPI  Studies Reviewed: .       Echo 09/22/21 IMPRESSIONS     1. Distal inferolateral and apical hypokinesis. . Left ventricular  ejection fraction, by estimation, is  45%%. The left ventricle has mildly  decreased function. The left ventricular internal cavity size was mildly  dilated.   2. Right ventricular systolic function is severely reduced. The right  ventricular size is moderately enlarged. There is moderately elevated  pulmonary artery systolic pressure.   3. Left atrial size was moderate to severely dilated.   4. Right atrial size was severely dilated.   5. Mitral valve is thickened with severe annular calcifications. Peak and  mean  gradient through the valve are 24 and 7 mm Hg respectively.  Consistent with at least moderate to severe MS Mitral regurgitant jet is  directed posterior into LA. Vena  contracta is 0.74; MR volume 55 ml OVerall consistent with severe MR. .  Severe mitral valve regurgitation. Severe mitral annular calcification.   6. S/P TAVR (29 mm Sapien prosthesis, implant date 09/16/19) Leaflets  appear to open well Peak and mean gradients through the valve are 17 and 9  mm Hg respectively AVA (VTI) is 1.8 cm2.      Mild perivalvular AI. No signficant change from echo in July 2022. The  aortic valve has been repaired/replaced. Aortic valve regurgitation is  mild. There is a 29 mm Sapien prosthetic (TAVR) valve present in the  aortic position. Procedure Date:  09/16/19.   7. Aortic dilatation noted. There is mild dilatation of the ascending  aorta, measuring 40 mm.   8. The inferior vena cava is dilated in size with <50% respiratory  variability, suggesting right atrial pressure of 15 mmHg.   Comparison(s): 10/20/20 EF 55-60%. Moderate MR. Moderate-severe MS  mean PG. AV mean PG, peak PG.   FINDINGS   Left Ventricle: Distal inferolateral and apical hypokinesis. Left  ventricular ejection fraction, by estimation, is 45%%. The left ventricle  has mildly decreased function. The left ventricular internal cavity size  was mildly dilated. There is no left  ventricular hypertrophy.   Right Ventricle: The right ventricular size is moderately enlarged. Right  vetricular wall thickness was not assessed. Right ventricular systolic  function is severely reduced. There is moderately elevated pulmonary  artery systolic pressure. The tricuspid  regurgitant velocity is 3.35 m/s, and with an assumed right atrial  pressure of 8 mmHg, the estimated right ventricular systolic pressure is  52.9 mmHg.   Left Atrium: Left atrial size was moderate to severely dilated.   Right Atrium: Right atrial  size was severely dilated.   Pericardium: Trivial pericardial effusion is present.   Mitral Valve: Mitral valve is thickened with severe annular  calcifications. Peak and mean gradient through the valve are 24 and 7 mm  Hg respectively. Consistent with at least moderate to severe MS Mitral  regurgitant jet is directed posterior into LA.  Vena contracta is 0.74; MR volume 55 ml OVerall consistent with severe MR.  There is moderate thickening of the mitral valve leaflet(s). There is mild  calcification of the mitral valve leaflet(s). Severe mitral annular  calcification. Severe mitral valve  regurgitation. MV peak gradient, 23.8 mmHg. The mean mitral valve gradient  is 7.0 mmHg.   Tricuspid Valve: The tricuspid valve is normal in structure. Tricuspid  valve regurgitation is mild.   Aortic Valve: S/P TAVR (29 mm Sapien prosthesis, implant date 09/16/19)  Leaflets appear to open well Peak and mean gradients through the valve are  17 and 9 mm Hg respectively AVA (VTI) is 1.8 cm2.  Mild perivalvular AI. No signficant change from echo in July 2022. The  aortic valve  has been repaired/replaced. Aortic valve regurgitation is  mild. Aortic regurgitation PHT measures 426 msec. Aortic valve mean  gradient measures 9.0 mmHg. Aortic valve  peak gradient measures 17.0 mmHg. Aortic valve area, by VTI measures 1.79  cm. There is a 29 mm Sapien prosthetic, stented (TAVR) valve present in  the aortic position. Procedure Date: 09/16/19.   Pulmonic Valve: The pulmonic valve was normal in structure. Pulmonic valve  regurgitation is mild. No evidence of pulmonic stenosis.   Aorta: The aortic root is normal in size and structure and aortic  dilatation noted. There is mild dilatation of the ascending aorta,  measuring 40 mm.   Venous: The inferior vena cava is dilated in size with less than 50%  respiratory variability, suggesting right atrial pressure of 15 mmHg.   IAS/Shunts: No atrial level shunt  detected by color flow Doppler.   Additional Comments: A device lead is visualized.      Physical Exam:   VS:  BP (!) 101/52   Pulse 66   Ht 5\' 10"  (1.778 m)   Wt 223 lb (101.2 kg)   SpO2 95%   BMI 32.00 kg/m    Wt Readings from Last 3 Encounters:  02/07/23 223 lb (101.2 kg)  09/12/22 218 lb (98.9 kg)  07/17/22 209 lb 3.2 oz (94.9 kg)    GEN: Well nourished, well developed in no acute distress NECK: No JVD; No carotid bruits CARDIAC: RRR, no murmurs, rubs, gallops RESPIRATORY:  Clear to auscultation without rales, wheezing or rhonchi  ABDOMEN: Soft, non-tender, non-distended EXTREMITIES:  No edema; No deformity, venous stasis rash noted with small ulcer on left leg with some drainage that does not appear to be infected  ASSESSMENT AND PLAN: .   Severe AS status post TAVR 2021 -No issues since surgery  Hypotension Blood pressure low at 101 systolic, possibly due to dehydration. No symptoms of lightheadedness or dizziness. Currently on Losartan 25mg . -Encourage increased fluid intake. -Track blood pressure for two weeks. If consistently low, consider discontinuing Losartan.  Diuretic use/chronic diastolic dysfunction  Patient has been off Lasix due to increased urination. No current symptoms of fluid overload (shortness of breath, leg swelling). -Advise Lasix to be used as needed, particularly if weight increases by 2 pounds overnight or 5 pounds in a week, or if there is swelling in legs and ankles.  Leg ulcers due to venous stasis Patient has chronic leg ulcers, likely due to venous insufficiency. Recent fall resulted in laceration requiring stitches. -Advise to keep ulcers clean and covered to prevent infection. -Consider use of lidocaine cream for pain relief.  Dental health Patient has a partial denture and is considering implants. Has a history of requiring antibiotics prior to dental procedures due to heart valve. -Advise patient to continue informing dentist of heart  valve and pacemaker prior to procedures. -Continue prescribing amoxicillin prior to dental procedures as needed.      Dispo: He can follow-up in 6 months with Dr. Lynnette Caffey  Signed, Sharlene Dory, PA-C

## 2023-02-27 DIAGNOSIS — Z1331 Encounter for screening for depression: Secondary | ICD-10-CM | POA: Diagnosis not present

## 2023-02-27 DIAGNOSIS — Z23 Encounter for immunization: Secondary | ICD-10-CM | POA: Diagnosis not present

## 2023-02-27 DIAGNOSIS — Z9181 History of falling: Secondary | ICD-10-CM | POA: Diagnosis not present

## 2023-02-27 DIAGNOSIS — Z6827 Body mass index (BMI) 27.0-27.9, adult: Secondary | ICD-10-CM | POA: Diagnosis not present

## 2023-02-27 DIAGNOSIS — Z0000 Encounter for general adult medical examination without abnormal findings: Secondary | ICD-10-CM | POA: Diagnosis not present

## 2023-03-15 ENCOUNTER — Ambulatory Visit: Payer: PPO

## 2023-03-15 DIAGNOSIS — I442 Atrioventricular block, complete: Secondary | ICD-10-CM

## 2023-03-15 LAB — CUP PACEART REMOTE DEVICE CHECK
Battery Remaining Longevity: 99 mo
Battery Voltage: 3 V
Brady Statistic RV Percent Paced: 98.05 %
Date Time Interrogation Session: 20241212014645
Implantable Lead Connection Status: 753985
Implantable Lead Implant Date: 20210616
Implantable Lead Location: 753860
Implantable Lead Model: 5076
Implantable Pulse Generator Implant Date: 20210616
Lead Channel Impedance Value: 380 Ohm
Lead Channel Impedance Value: 456 Ohm
Lead Channel Pacing Threshold Amplitude: 0.5 V
Lead Channel Pacing Threshold Pulse Width: 0.4 ms
Lead Channel Sensing Intrinsic Amplitude: 8.25 mV
Lead Channel Sensing Intrinsic Amplitude: 8.25 mV
Lead Channel Setting Pacing Amplitude: 2.5 V
Lead Channel Setting Pacing Pulse Width: 0.4 ms
Lead Channel Setting Sensing Sensitivity: 4 mV
Zone Setting Status: 755011

## 2023-03-22 ENCOUNTER — Telehealth: Payer: Self-pay | Admitting: Physician Assistant

## 2023-03-22 MED ORDER — DIGOXIN 125 MCG PO TABS: 0.0625 mg | ORAL_TABLET | Freq: Every day | ORAL | 3 refills | Status: DC

## 2023-03-22 NOTE — Telephone Encounter (Signed)
*  STAT* If patient is at the pharmacy, call can be transferred to refill team.   1. Which medications need to be refilled? (please list name of each medication and dose if known)   digoxin (LANOXIN) 0.125 MG tablet   2. Would you like to learn more about the convenience, safety, & potential cost savings by using the [facility]?   3. Are you open to using the [facility] (Type [facility]. ).  4. Which pharmacy/location (including street and city if local pharmacy) is medication to be sent to?  [facility] [address] [place], [place] - [address]   5. Do they need a 30 day or 90 day supply?  30 day  Caller Darl Pikes, [place] [facility]) stated patient is completely out of this medication.

## 2023-03-25 ENCOUNTER — Other Ambulatory Visit: Payer: Self-pay | Admitting: Cardiology

## 2023-03-30 ENCOUNTER — Ambulatory Visit: Payer: PPO | Attending: Cardiology | Admitting: Cardiology

## 2023-03-30 ENCOUNTER — Encounter: Payer: Self-pay | Admitting: Cardiology

## 2023-03-30 VITALS — BP 120/68 | HR 79 | Ht 70.0 in | Wt 227.0 lb

## 2023-03-30 DIAGNOSIS — I442 Atrioventricular block, complete: Secondary | ICD-10-CM

## 2023-03-30 DIAGNOSIS — D6869 Other thrombophilia: Secondary | ICD-10-CM | POA: Diagnosis not present

## 2023-03-30 DIAGNOSIS — I493 Ventricular premature depolarization: Secondary | ICD-10-CM

## 2023-03-30 DIAGNOSIS — I4821 Permanent atrial fibrillation: Secondary | ICD-10-CM

## 2023-03-30 DIAGNOSIS — I10 Essential (primary) hypertension: Secondary | ICD-10-CM

## 2023-03-30 LAB — CUP PACEART INCLINIC DEVICE CHECK
Battery Remaining Longevity: 100 mo
Battery Voltage: 3 V
Brady Statistic RV Percent Paced: 96.36 %
Date Time Interrogation Session: 20241227170039
Implantable Lead Connection Status: 753985
Implantable Lead Implant Date: 20210616
Implantable Lead Location: 753860
Implantable Lead Model: 5076
Implantable Pulse Generator Implant Date: 20210616
Lead Channel Impedance Value: 399 Ohm
Lead Channel Impedance Value: 475 Ohm
Lead Channel Pacing Threshold Amplitude: 0.375 V
Lead Channel Pacing Threshold Amplitude: 0.5 V
Lead Channel Pacing Threshold Pulse Width: 0.4 ms
Lead Channel Pacing Threshold Pulse Width: 0.4 ms
Lead Channel Sensing Intrinsic Amplitude: 0 mV
Lead Channel Sensing Intrinsic Amplitude: 7.75 mV
Lead Channel Sensing Intrinsic Amplitude: 7.75 mV
Lead Channel Setting Pacing Amplitude: 2.5 V
Lead Channel Setting Pacing Pulse Width: 0.4 ms
Lead Channel Setting Sensing Sensitivity: 4 mV
Zone Setting Status: 755011

## 2023-03-30 NOTE — Progress Notes (Unsigned)
Electrophysiology Office Note:   Date:  03/30/2023  ID:  Gregory [name], DOB 1940/10/21, MRN [id number]  Primary Cardiologist: Lance Muss, MD Primary Heart Failure: None Electrophysiologist: Miamor Ayler Jorja Loa, MD  {Click to update primary MD,subspecialty MD or APP then REFRESH:1}    History of Present Illness:   Gregory [name] is a 82 y.o. male with h/o atrial fibrillation, diastolic heart failure, mitral regurgitation, non-Hodgkin's lymphoma, diabetes, hypertension, severe MAC with moderate to severe MR, aortic stenosis post TAVR in 2021, complete heart block post pacemaker seen today for routine electrophysiology followup.   Since last being seen in our clinic the patient reports doing well.  He has no chest pain or shortness of breath.  He is able to all of his daily activities without restriction.  He is happy with his control.  He has no cardiac awareness..  he denies chest pain, palpitations, dyspnea, PND, orthopnea, nausea, vomiting, dizziness, syncope, edema, weight gain, or early satiety.   Review of systems complete and found to be negative unless listed in HPI.      EP Information / Studies Reviewed:    EKG is ordered today. Personal review as below.  EKG Interpretation Date/Time:  Friday March 30 2023 16:25:22 EST Ventricular Rate:  79 PR Interval:    QRS Duration:  172 QT Interval:  440 QTC Calculation: 504 R Axis:   263  Text Interpretation: Ventricular-paced rhythm with occasional Premature ventricular complexes When compared with ECG of 14-Apr-2022 12:20, No significant change since last tracing Confirmed by Lilliona Blakeney ([id number]) on 03/30/2023 4:30:54 PM   PPM Interrogation-  reviewed in detail today,  See PACEART report.  Device History: Medtronic Single Chamber PPM implanted 09/17/19 for CHB  Risk Assessment/Calculations:    CHA2DS2-VASc Score = 5  {Confirm score is correct.  If not, click here to update score.  REFRESH note.  :1} This indicates a  7.2% annual risk of stroke. The patient's score is based upon: CHF History: 0 HTN History: 1 Diabetes History: 1 Stroke History: 0 Vascular Disease History: 1 Age Score: 2 Gender Score: 0   {This patient has a significant risk of stroke if diagnosed with atrial fibrillation.  Please consider VKA or DOAC agent for anticoagulation if the bleeding risk is acceptable.   You can also use the SmartPhrase .[email or web address] for documentation.   :[id number]}   Physical Exam:   VS:  BP 120/68 (BP Location: Left Arm, Patient Position: Sitting, Cuff Size: Large)   Pulse 79   Ht 5\' 10"  (1.778 m)   Wt 227 lb (103 kg)   SpO2 98%   BMI 32.57 kg/m    Wt Readings from Last 3 Encounters:  03/30/23 227 lb (103 kg)  02/07/23 223 lb (101.2 kg)  09/12/22 218 lb (98.9 kg)     GEN: Well nourished, well developed in no acute distress NECK: No JVD; No carotid bruits CARDIAC: Regular rate and rhythm, no murmurs, rubs, gallops RESPIRATORY:  Clear to auscultation without rales, wheezing or rhonchi  ABDOMEN: Soft, non-tender, non-distended EXTREMITIES:  No edema; No deformity   ASSESSMENT AND PLAN:    CHB s/p Medtronic PPM  Normal PPM function See Pace Art report No changes today  2.  Permanent atrial fibrillation: Well rate controlled  3.  Severe aortic stenosis: Post TAVR.  Plan per primary cardiology  4.  PVCs: Continue metoprolol  5.  Secondary hypercoagulable state: Continue Coumadin for atrial fibrillation  6.  Hypertension: Currently well-controlled  Disposition:   Follow up  with EP APP in 6 months  Signed, Chayim Bialas Jorja Loa, MD

## 2023-04-19 NOTE — Progress Notes (Signed)
Remote pacemaker transmission.   

## 2023-06-14 ENCOUNTER — Ambulatory Visit (INDEPENDENT_AMBULATORY_CARE_PROVIDER_SITE_OTHER): Payer: PPO

## 2023-06-14 DIAGNOSIS — I442 Atrioventricular block, complete: Secondary | ICD-10-CM | POA: Diagnosis not present

## 2023-06-14 LAB — CUP PACEART REMOTE DEVICE CHECK
Battery Remaining Longevity: 97 mo
Battery Voltage: 3 V
Brady Statistic RV Percent Paced: 93.77 %
Date Time Interrogation Session: 20250313040857
Implantable Lead Connection Status: 753985
Implantable Lead Implant Date: 20210616
Implantable Lead Location: 753860
Implantable Lead Model: 5076
Implantable Pulse Generator Implant Date: 20210616
Lead Channel Impedance Value: 380 Ohm
Lead Channel Impedance Value: 456 Ohm
Lead Channel Pacing Threshold Amplitude: 0.375 V
Lead Channel Pacing Threshold Pulse Width: 0.4 ms
Lead Channel Sensing Intrinsic Amplitude: 8 mV
Lead Channel Sensing Intrinsic Amplitude: 8 mV
Lead Channel Setting Pacing Amplitude: 2.5 V
Lead Channel Setting Pacing Pulse Width: 0.4 ms
Lead Channel Setting Sensing Sensitivity: 4 mV
Zone Setting Status: 755011

## 2023-07-11 DIAGNOSIS — I4821 Permanent atrial fibrillation: Secondary | ICD-10-CM | POA: Diagnosis not present

## 2023-07-11 DIAGNOSIS — I5032 Chronic diastolic (congestive) heart failure: Secondary | ICD-10-CM | POA: Diagnosis not present

## 2023-07-11 DIAGNOSIS — I050 Rheumatic mitral stenosis: Secondary | ICD-10-CM | POA: Diagnosis not present

## 2023-07-11 DIAGNOSIS — I5021 Acute systolic (congestive) heart failure: Secondary | ICD-10-CM | POA: Diagnosis not present

## 2023-07-11 DIAGNOSIS — Z952 Presence of prosthetic heart valve: Secondary | ICD-10-CM | POA: Diagnosis not present

## 2023-07-11 DIAGNOSIS — Z6828 Body mass index (BMI) 28.0-28.9, adult: Secondary | ICD-10-CM | POA: Diagnosis not present

## 2023-07-11 DIAGNOSIS — Z7901 Long term (current) use of anticoagulants: Secondary | ICD-10-CM | POA: Diagnosis not present

## 2023-07-11 DIAGNOSIS — R635 Abnormal weight gain: Secondary | ICD-10-CM | POA: Diagnosis not present

## 2023-07-13 DIAGNOSIS — I5032 Chronic diastolic (congestive) heart failure: Secondary | ICD-10-CM | POA: Diagnosis not present

## 2023-07-13 DIAGNOSIS — I4821 Permanent atrial fibrillation: Secondary | ICD-10-CM | POA: Diagnosis not present

## 2023-07-13 DIAGNOSIS — Z7901 Long term (current) use of anticoagulants: Secondary | ICD-10-CM | POA: Diagnosis not present

## 2023-07-18 DIAGNOSIS — I5032 Chronic diastolic (congestive) heart failure: Secondary | ICD-10-CM | POA: Diagnosis not present

## 2023-07-18 DIAGNOSIS — Z7901 Long term (current) use of anticoagulants: Secondary | ICD-10-CM | POA: Diagnosis not present

## 2023-07-18 DIAGNOSIS — I4821 Permanent atrial fibrillation: Secondary | ICD-10-CM | POA: Diagnosis not present

## 2023-07-27 NOTE — Progress Notes (Signed)
 Remote pacemaker transmission.

## 2023-08-15 DIAGNOSIS — I4821 Permanent atrial fibrillation: Secondary | ICD-10-CM | POA: Diagnosis not present

## 2023-08-15 DIAGNOSIS — Z7901 Long term (current) use of anticoagulants: Secondary | ICD-10-CM | POA: Diagnosis not present

## 2023-09-12 DIAGNOSIS — Z7901 Long term (current) use of anticoagulants: Secondary | ICD-10-CM | POA: Diagnosis not present

## 2023-09-12 DIAGNOSIS — I4821 Permanent atrial fibrillation: Secondary | ICD-10-CM | POA: Diagnosis not present

## 2023-09-13 ENCOUNTER — Ambulatory Visit (INDEPENDENT_AMBULATORY_CARE_PROVIDER_SITE_OTHER): Payer: PPO

## 2023-09-13 DIAGNOSIS — I442 Atrioventricular block, complete: Secondary | ICD-10-CM

## 2023-09-13 LAB — CUP PACEART REMOTE DEVICE CHECK
Battery Remaining Longevity: 95 mo
Battery Voltage: 3 V
Brady Statistic RV Percent Paced: 97.1 %
Date Time Interrogation Session: 20250612004735
Implantable Lead Connection Status: 753985
Implantable Lead Implant Date: 20210616
Implantable Lead Location: 753860
Implantable Lead Model: 5076
Implantable Pulse Generator Implant Date: 20210616
Lead Channel Impedance Value: 380 Ohm
Lead Channel Impedance Value: 456 Ohm
Lead Channel Pacing Threshold Amplitude: 0.5 V
Lead Channel Pacing Threshold Pulse Width: 0.4 ms
Lead Channel Sensing Intrinsic Amplitude: 6.625 mV
Lead Channel Sensing Intrinsic Amplitude: 6.625 mV
Lead Channel Setting Pacing Amplitude: 2.5 V
Lead Channel Setting Pacing Pulse Width: 0.4 ms
Lead Channel Setting Sensing Sensitivity: 4 mV
Zone Setting Status: 755011

## 2023-09-18 ENCOUNTER — Ambulatory Visit: Payer: Self-pay | Admitting: Cardiology

## 2023-09-21 ENCOUNTER — Other Ambulatory Visit: Payer: Self-pay | Admitting: Cardiology

## 2023-10-18 DIAGNOSIS — Z7901 Long term (current) use of anticoagulants: Secondary | ICD-10-CM | POA: Diagnosis not present

## 2023-10-18 DIAGNOSIS — E7800 Pure hypercholesterolemia, unspecified: Secondary | ICD-10-CM | POA: Diagnosis not present

## 2023-10-18 DIAGNOSIS — I4821 Permanent atrial fibrillation: Secondary | ICD-10-CM | POA: Diagnosis not present

## 2023-10-18 DIAGNOSIS — N400 Enlarged prostate without lower urinary tract symptoms: Secondary | ICD-10-CM | POA: Diagnosis not present

## 2023-10-18 DIAGNOSIS — Z6827 Body mass index (BMI) 27.0-27.9, adult: Secondary | ICD-10-CM | POA: Diagnosis not present

## 2023-10-18 DIAGNOSIS — D649 Anemia, unspecified: Secondary | ICD-10-CM | POA: Diagnosis not present

## 2023-10-18 DIAGNOSIS — E1140 Type 2 diabetes mellitus with diabetic neuropathy, unspecified: Secondary | ICD-10-CM | POA: Diagnosis not present

## 2023-10-18 DIAGNOSIS — I10 Essential (primary) hypertension: Secondary | ICD-10-CM | POA: Diagnosis not present

## 2023-10-18 DIAGNOSIS — I739 Peripheral vascular disease, unspecified: Secondary | ICD-10-CM | POA: Diagnosis not present

## 2023-10-18 LAB — LAB REPORT - SCANNED
A1c: 6.6
Creatinine, POC: 40 mg/dL
EGFR: 88
Microalb Creat Ratio: 67.1
Microalbumin, Urine: 2.67

## 2023-10-28 NOTE — Progress Notes (Signed)
 Cardiology Office Note:   Date:  11/05/2023  ID:  Gregory [name], DOB 1941-02-04, MRN [id number] PCP:  Cleotilde Planas, MD  [facility] Providers Cardiologist:  Wendel Haws, MD Referring MD: Cleotilde Planas, MD  Chief Complaint/Reason for Referral: Follow-up for diastolic heart failure and valvular disease ASSESSMENT:    1. S/P TAVR (transcatheter aortic valve replacement)   2. Mitral valve disease   3. Mild CAD   4. Chronic combined systolic and diastolic heart failure (HCC)   5. Right ventricular dysfunction   6. Type 2 diabetes mellitus with complication, without long-term current use of insulin  (HCC)   7. Hypertension associated with diabetes (HCC)   8. Hyperlipidemia associated with type 2 diabetes mellitus (HCC)   9. Aortic atherosclerosis (HCC)   10. Permanent atrial fibrillation (HCC)   11. Secondary hypercoagulable state (HCC)   12. Pacemaker   13. CKD stage 2 due to type 2 diabetes mellitus (HCC)     PLAN:   In order of problems listed above: Aortic stenosis: Echocardiogram in 2023 demonstrated good valve function with a mean gradient of 9 mmHg. Mitral valvular disease: I reviewed the patient's TAVR protocol CTA which demonstrated severe MAC with almost 360 degree circumferential calcium .  He also has developed RV dysfunction.  During his last hospitalization in 2023 he was converted to DNR.   Mild CAD: Continue Coumadin  6 mg instead of aspirin , atorvastatin  20 mg Chronic systolic and diastolic heart failure: Continue Farxiga  10 mg, losartan  25 mg, Toprol  50 mg, torsemide  20 mg, digoxin  0.0625 mg daily.  Check digoxin  level. Right ventricular dysfunction: Will obtain echocardiogram to evaluate further.  Continue Farxiga  10 mg, losartan  25 mg, Toprol  50 mg, torsemide  20 mg, digoxin  0.0625 mg daily. T2DM: Continue Coumadin  instead of aspirin , atorvastatin  20 mg, losartan  25 mg, Farxiga  10 mg. Hypertension: Continue losartan  25 mg, Toprol  50 mg; BP is well-controlled  today. Hyperlipidemia: Continue atorvastatin  20 mg Aortic atherosclerosis: Continue atorvastatin  20 mg, Coumadin . Permanent atrial fibrillation: Continue Coumadin  and Toprol  50 mg daily Secondary hypercoagulable state: Continue Coumadin . CHB status post PPM: Followed by EP service.  On his last monitor in June 2025, he was 97% paced.  This could certainly be contributing to RV and LV dysfunction but given his advanced age, DNR status, and comorbidities I am not sure whether inserting an LV lead would be advisable.  Will review echocardiogram and reach out to Dr. Inocencio. CKD stage II: Continue losartan  25 mg, Farxiga  10 mg.            Dispo:  Return in about 6 months (around 05/07/2024).       Labs/tests ordered: Orders Placed This Encounter  Procedures   Digoxin  level   ECHOCARDIOGRAM COMPLETE    Current medicines are reviewed at length with the patient today.  The patient does not have concerns regarding medicines.  I spent 35 minutes reviewing all clinical data during and prior to this visit including all relevant imaging studies, laboratories, clinical information from other health systems and prior notes from both Cardiology and other specialties, interviewing the patient, conducting a complete physical examination, and coordinating care in order to formulate a comprehensive and personalized evaluation and treatment plan.   History of Present Illness:    FOCUSED PROBLEM LIST:   Aortic stenosis Status post 29 mm SAPIEN 3 2021 Mitral valvular disease Moderate MS MG 7, severe MR, severe MAC TTE 2023 CAD Minimal; coronary angiography 2021 Diastolic heart failure T2DM Not on insulin  Hypertension Hyperlipidemia Aortic atherosclerosis CT  abdomen pelvis 2021 Persistent atrial fibrillation CV 2 score 5 On Coumadin   Complete heart block Status post PPM CKD stage II BMI 32/BSA 2.2 DNR  August 2025:  Patient consents to use of AI scribe. The patient is an 83 year old  male with the above listed medical problems here for routine cardiology follow-up.  The patient was last seen in November 2024.  He was feeling fairly well and denied any cardiovascular symptoms despite all relatively low blood pressure with systolics of around 100.  No changes were made to his medical regimen.  Saw EP in December and current management was continued.  He is doing well overall and is trying to stay active. His breathing is good, and he has not experienced significant bleeding while on Coumadin , except for minor cuts. He recalls an incident where he cut his arm at a store, resulting in significant bleeding due to his medication.  He has a history of heart failure and mitral valve disease, with a previous hospitalization for heart failure last summer. He was transferred to various rehabilitation facilities, which he found unsuitable, leading to significant weight loss. His condition improved after leaving those facilities.  He experiences swelling in his legs.  He has a history of a fall about a year and a half ago, resulting in a hospital visit and twelve stitches. He uses a walker and cane for mobility due to a bad leg. He has been advised against knee replacement surgery due to vascular issues, including calcium  in his blood vessels, which poses a risk of infection and potential amputation.  He spends most of his time indoors due to the risk of falling, although he prefers being outside. He reports frequent urination as a side effect of his medications.      Current Medications: Current Meds  Medication Sig   acetaminophen  (TYLENOL ) 650 MG CR tablet Take 650 mg by mouth every 8 (eight) hours as needed for pain.   atorvastatin  (LIPITOR) 20 MG tablet Take 20 mg by mouth daily.   Cholecalciferol 50 MCG (2000 UT) TABS 1 tablet Orally Once a day for 30 day(s)   cyanocobalamin  1000 MCG tablet Take 1,000 mcg by mouth daily.   dapagliflozin  propanediol (FARXIGA ) 10 MG TABS tablet Take  1 tablet (10 mg total) by mouth daily.   digoxin  (LANOXIN ) 0.125 MG tablet Take 0.5 tablets (0.0625 mg total) by mouth daily.   feeding supplement (ENSURE ENLIVE / ENSURE PLUS) LIQD Take 237 mLs by mouth 2 (two) times daily between meals.   ferrous sulfate 325 (65 FE) MG tablet 1 tablet Orally Once a day   gabapentin (NEURONTIN) 300 MG capsule daily.   ketoconazole (NIZORAL) 2 % cream Apply 1 Application topically 2 (two) times daily as needed for irritation.   losartan  (COZAAR ) 25 MG tablet Take 1 tablet by mouth once daily   metFORMIN  (GLUCOPHAGE -XR) 500 MG 24 hr tablet Take 500 mg by mouth daily with breakfast.   metoprolol  succinate (TOPROL -XL) 50 MG 24 hr tablet Take 1 tablet (50 mg total) by mouth at bedtime. Take with or immediately following a meal.   Multiple Vitamins-Minerals (PRESERVISION AREDS 2 PO) Take 1 tablet by mouth daily.   ONE TOUCH ULTRA TEST test strip 1 each by Other route as needed (BLOOD SUGAR).    ONETOUCH DELICA LANCETS 33G MISC Apply 1 each topically daily as needed (BLOOD SUGAR).    senna-docusate (SENOKOT-S) 8.6-50 MG tablet Take 1 tablet by mouth at bedtime as needed for mild constipation.  SHINGRIX injection Inject 0.5 mLs into the muscle once.   tamsulosin  (FLOMAX ) 0.4 MG CAPS capsule Take 0.4 mg by mouth daily.   torsemide  (DEMADEX ) 20 MG tablet Take 1 tablet (20 mg total) by mouth as needed.   warfarin (COUMADIN ) 6 MG tablet Take 6 mg by mouth daily. Pt takes 1/2 tablet 3 mg on Wednesdays and Saturday.     Review of Systems:   Please see the history of present illness.    All other systems reviewed and are negative.     EKGs/Labs/Other Test Reviewed:   EKG: December 2024 V paced with occasional PVCs  EKG Interpretation Date/Time:    Ventricular Rate:    PR Interval:    QRS Duration:    QT Interval:    QTC Calculation:   R Axis:      Text Interpretation:           Risk Assessment/Calculations:    CHA2DS2-VASc Score = 5   This indicates  a 7.2% annual risk of stroke. The patient's score is based upon: CHF History: 0 HTN History: 1 Diabetes History: 1 Stroke History: 0 Vascular Disease History: 1 Age Score: 2 Gender Score: 0         Physical Exam:   VS:  BP 134/66   Pulse 64   Ht 5' 10 (1.778 m)   Wt 219 lb (99.3 kg)   SpO2 91%   BMI 31.42 kg/m        Wt Readings from Last 3 Encounters:  11/05/23 219 lb (99.3 kg)  03/30/23 227 lb (103 kg)  02/07/23 223 lb (101.2 kg)      GENERAL:  No apparent distress, AOx3 HEENT:  No carotid bruits, +2 carotid impulses, no scleral icterus CAR:  Irregular RR with 2/6 holosystolic murmur at axilla with increase with expiration RES:  Clear to auscultation bilaterally ABD:  Soft, nontender, nondistended, positive bowel sounds x 4 VASC:  +2 radial pulses, +2 carotid pulses NEURO:  CN 2-12 grossly intact; motor and sensory grossly intact PSYCH:  No active depression or anxiety EXT:  +1 edema no ecchymosis, or cyanosis  Signed, Rateel Beldin K Inaki Vantine, MD  11/05/2023 3:49 PM    [facility] [address] [place], [place], [place]  [address] Phone: [phone]; Fax: [phone]   Note:  This document was prepared using Dragon voice recognition software and may include unintentional dictation errors.

## 2023-10-29 DIAGNOSIS — E119 Type 2 diabetes mellitus without complications: Secondary | ICD-10-CM | POA: Diagnosis not present

## 2023-10-30 DIAGNOSIS — E1169 Type 2 diabetes mellitus with other specified complication: Secondary | ICD-10-CM | POA: Diagnosis not present

## 2023-10-30 DIAGNOSIS — I509 Heart failure, unspecified: Secondary | ICD-10-CM | POA: Diagnosis not present

## 2023-10-30 DIAGNOSIS — N400 Enlarged prostate without lower urinary tract symptoms: Secondary | ICD-10-CM | POA: Diagnosis not present

## 2023-10-30 DIAGNOSIS — Z9849 Cataract extraction status, unspecified eye: Secondary | ICD-10-CM | POA: Diagnosis not present

## 2023-10-30 DIAGNOSIS — D6869 Other thrombophilia: Secondary | ICD-10-CM | POA: Diagnosis not present

## 2023-10-30 DIAGNOSIS — M1990 Unspecified osteoarthritis, unspecified site: Secondary | ICD-10-CM | POA: Diagnosis not present

## 2023-10-30 DIAGNOSIS — I110 Hypertensive heart disease with heart failure: Secondary | ICD-10-CM | POA: Diagnosis not present

## 2023-10-30 DIAGNOSIS — E1151 Type 2 diabetes mellitus with diabetic peripheral angiopathy without gangrene: Secondary | ICD-10-CM | POA: Diagnosis not present

## 2023-10-30 DIAGNOSIS — I739 Peripheral vascular disease, unspecified: Secondary | ICD-10-CM | POA: Diagnosis not present

## 2023-10-30 DIAGNOSIS — E785 Hyperlipidemia, unspecified: Secondary | ICD-10-CM | POA: Diagnosis not present

## 2023-10-30 DIAGNOSIS — E669 Obesity, unspecified: Secondary | ICD-10-CM | POA: Diagnosis not present

## 2023-10-30 DIAGNOSIS — I4891 Unspecified atrial fibrillation: Secondary | ICD-10-CM | POA: Diagnosis not present

## 2023-11-01 DIAGNOSIS — E6609 Other obesity due to excess calories: Secondary | ICD-10-CM | POA: Diagnosis not present

## 2023-11-01 DIAGNOSIS — E1151 Type 2 diabetes mellitus with diabetic peripheral angiopathy without gangrene: Secondary | ICD-10-CM | POA: Diagnosis not present

## 2023-11-01 DIAGNOSIS — I340 Nonrheumatic mitral (valve) insufficiency: Secondary | ICD-10-CM | POA: Diagnosis not present

## 2023-11-05 ENCOUNTER — Encounter: Payer: Self-pay | Admitting: Internal Medicine

## 2023-11-05 ENCOUNTER — Ambulatory Visit: Attending: Internal Medicine | Admitting: Internal Medicine

## 2023-11-05 VITALS — BP 134/66 | HR 64 | Ht 70.0 in | Wt 219.0 lb

## 2023-11-05 DIAGNOSIS — E118 Type 2 diabetes mellitus with unspecified complications: Secondary | ICD-10-CM

## 2023-11-05 DIAGNOSIS — N182 Chronic kidney disease, stage 2 (mild): Secondary | ICD-10-CM

## 2023-11-05 DIAGNOSIS — E1122 Type 2 diabetes mellitus with diabetic chronic kidney disease: Secondary | ICD-10-CM | POA: Diagnosis not present

## 2023-11-05 DIAGNOSIS — I5042 Chronic combined systolic (congestive) and diastolic (congestive) heart failure: Secondary | ICD-10-CM | POA: Diagnosis not present

## 2023-11-05 DIAGNOSIS — D6869 Other thrombophilia: Secondary | ICD-10-CM | POA: Diagnosis not present

## 2023-11-05 DIAGNOSIS — E1159 Type 2 diabetes mellitus with other circulatory complications: Secondary | ICD-10-CM

## 2023-11-05 DIAGNOSIS — I519 Heart disease, unspecified: Secondary | ICD-10-CM

## 2023-11-05 DIAGNOSIS — I059 Rheumatic mitral valve disease, unspecified: Secondary | ICD-10-CM

## 2023-11-05 DIAGNOSIS — E1169 Type 2 diabetes mellitus with other specified complication: Secondary | ICD-10-CM

## 2023-11-05 DIAGNOSIS — Z952 Presence of prosthetic heart valve: Secondary | ICD-10-CM

## 2023-11-05 DIAGNOSIS — E785 Hyperlipidemia, unspecified: Secondary | ICD-10-CM | POA: Diagnosis not present

## 2023-11-05 DIAGNOSIS — I2510 Atherosclerotic heart disease of native coronary artery without angina pectoris: Secondary | ICD-10-CM

## 2023-11-05 DIAGNOSIS — Z950 Presence of cardiac pacemaker: Secondary | ICD-10-CM

## 2023-11-05 DIAGNOSIS — I4821 Permanent atrial fibrillation: Secondary | ICD-10-CM | POA: Diagnosis not present

## 2023-11-05 DIAGNOSIS — I152 Hypertension secondary to endocrine disorders: Secondary | ICD-10-CM

## 2023-11-05 DIAGNOSIS — I700 Atherosclerosis of aorta: Secondary | ICD-10-CM | POA: Diagnosis not present

## 2023-11-05 NOTE — Patient Instructions (Signed)
 Medication Instructions:  Your physician recommends that you continue on your current medications as directed. Please refer to the Current Medication list given to you today. *If you need a refill on your cardiac medications before your next appointment, please call your pharmacy*  Lab Work: TODAY-DIGOXIN  LEVEL If you have labs (blood work) drawn today and your tests are completely normal, you will receive your results only by: MyChart Message (if you have MyChart) OR A paper copy in the mail If you have any lab test that is abnormal or we need to change your treatment, we will call you to review the results.  Testing/Procedures: Your physician has requested that you have an echocardiogram. Echocardiography is a painless test that uses sound waves to create images of your heart. It provides your doctor with information about the size and shape of your heart and how well your heart's chambers and valves are working. This procedure takes approximately one hour. There are no restrictions for this procedure. Please do NOT wear cologne, perfume, aftershave, or lotions (deodorant is allowed). Please arrive 15 minutes prior to your appointment time.  Please note: We ask at that you not bring children with you during ultrasound (echo/ vascular) testing. Due to room size and safety concerns, children are not allowed in the ultrasound rooms during exams. Our front office staff cannot provide observation of children in our lobby area while testing is being conducted. An adult accompanying a patient to their appointment will only be allowed in the ultrasound room at the discretion of the ultrasound technician under special circumstances. We apologize for any inconvenience.  Follow-Up: At [facility], you and your health needs are our priority.  As part of our continuing mission to provide you with exceptional heart care, our providers are all part of one team.  This team includes your primary  Cardiologist (physician) and Advanced Practice Providers or APPs (Physician Assistants and Nurse Practitioners) who all work together to provide you with the care you need, when you need it.  Your next appointment:   6 month(s)  Provider:   Lurena Red, MD   We recommend signing up for the patient portal called MyChart.  Sign up information is provided on this After Visit Summary.  MyChart is used to connect with patients for Virtual Visits (Telemedicine).  Patients are able to view lab/test results, encounter notes, upcoming appointments, etc.  Non-urgent messages can be sent to your provider as well.   To learn more about what you can do with MyChart, go to [email or web address].   Other Instructions

## 2023-11-06 ENCOUNTER — Ambulatory Visit: Payer: Self-pay | Admitting: Internal Medicine

## 2023-11-06 DIAGNOSIS — Z952 Presence of prosthetic heart valve: Secondary | ICD-10-CM

## 2023-11-06 DIAGNOSIS — I5042 Chronic combined systolic (congestive) and diastolic (congestive) heart failure: Secondary | ICD-10-CM

## 2023-11-06 LAB — DIGOXIN LEVEL: Digoxin, Serum: <0.4 ng/mL — ABNORMAL LOW (ref 0.5–0.9)

## 2023-11-07 NOTE — Progress Notes (Signed)
 Remote pacemaker transmission.

## 2023-11-08 MED ORDER — DIGOXIN 125 MCG PO TABS: 0.1250 mg | ORAL_TABLET | Freq: Every day | ORAL | 0 refills | Status: DC

## 2023-12-02 DIAGNOSIS — E1151 Type 2 diabetes mellitus with diabetic peripheral angiopathy without gangrene: Secondary | ICD-10-CM | POA: Diagnosis not present

## 2023-12-02 DIAGNOSIS — I340 Nonrheumatic mitral (valve) insufficiency: Secondary | ICD-10-CM | POA: Diagnosis not present

## 2023-12-02 DIAGNOSIS — E6609 Other obesity due to excess calories: Secondary | ICD-10-CM | POA: Diagnosis not present

## 2023-12-11 ENCOUNTER — Ambulatory Visit (HOSPITAL_COMMUNITY)
Admission: RE | Admit: 2023-12-11 | Discharge: 2023-12-11 | Disposition: A | Discharge status: Home / Self Care | Source: Ambulatory Visit | Attending: Internal Medicine | Admitting: Internal Medicine

## 2023-12-11 DIAGNOSIS — I700 Atherosclerosis of aorta: Secondary | ICD-10-CM | POA: Insufficient documentation

## 2023-12-11 DIAGNOSIS — E785 Hyperlipidemia, unspecified: Secondary | ICD-10-CM | POA: Diagnosis not present

## 2023-12-11 DIAGNOSIS — I5042 Chronic combined systolic (congestive) and diastolic (congestive) heart failure: Secondary | ICD-10-CM | POA: Diagnosis not present

## 2023-12-11 DIAGNOSIS — E1122 Type 2 diabetes mellitus with diabetic chronic kidney disease: Secondary | ICD-10-CM | POA: Insufficient documentation

## 2023-12-11 DIAGNOSIS — I4821 Permanent atrial fibrillation: Secondary | ICD-10-CM | POA: Insufficient documentation

## 2023-12-11 DIAGNOSIS — E1159 Type 2 diabetes mellitus with other circulatory complications: Secondary | ICD-10-CM | POA: Diagnosis not present

## 2023-12-11 DIAGNOSIS — N182 Chronic kidney disease, stage 2 (mild): Secondary | ICD-10-CM | POA: Insufficient documentation

## 2023-12-11 DIAGNOSIS — I2510 Atherosclerotic heart disease of native coronary artery without angina pectoris: Secondary | ICD-10-CM | POA: Insufficient documentation

## 2023-12-11 DIAGNOSIS — D6869 Other thrombophilia: Secondary | ICD-10-CM | POA: Diagnosis not present

## 2023-12-11 DIAGNOSIS — I519 Heart disease, unspecified: Secondary | ICD-10-CM | POA: Diagnosis not present

## 2023-12-11 DIAGNOSIS — I152 Hypertension secondary to endocrine disorders: Secondary | ICD-10-CM | POA: Diagnosis not present

## 2023-12-11 DIAGNOSIS — E1169 Type 2 diabetes mellitus with other specified complication: Secondary | ICD-10-CM | POA: Diagnosis not present

## 2023-12-11 DIAGNOSIS — Z950 Presence of cardiac pacemaker: Secondary | ICD-10-CM | POA: Diagnosis not present

## 2023-12-11 DIAGNOSIS — Z952 Presence of prosthetic heart valve: Secondary | ICD-10-CM | POA: Insufficient documentation

## 2023-12-11 DIAGNOSIS — I059 Rheumatic mitral valve disease, unspecified: Secondary | ICD-10-CM | POA: Insufficient documentation

## 2023-12-11 DIAGNOSIS — E118 Type 2 diabetes mellitus with unspecified complications: Secondary | ICD-10-CM | POA: Insufficient documentation

## 2023-12-12 LAB — ECHOCARDIOGRAM COMPLETE
AR max vel: 1.94 cm2
AV Area VTI: 1.91 cm2
AV Area mean vel: 1.82 cm2
AV Mean grad: 9 mmHg
AV Peak grad: 14 mmHg
Ao pk vel: 1.87 m/s
Area-P 1/2: 1.62 cm2
MV M vel: 5.19 m/s
MV Peak grad: 107.5 mmHg
MV VTI: 4.85 cm2
P 1/2 time: 434 ms
Radius: 1 cm
S' Lateral: 3.2 cm

## 2023-12-13 ENCOUNTER — Ambulatory Visit (INDEPENDENT_AMBULATORY_CARE_PROVIDER_SITE_OTHER): Payer: PPO

## 2023-12-13 ENCOUNTER — Ambulatory Visit: Payer: Self-pay | Admitting: Cardiology

## 2023-12-13 DIAGNOSIS — I5042 Chronic combined systolic (congestive) and diastolic (congestive) heart failure: Secondary | ICD-10-CM | POA: Diagnosis not present

## 2023-12-13 LAB — CUP PACEART REMOTE DEVICE CHECK
Battery Remaining Longevity: 94 mo
Battery Voltage: 2.99 V
Brady Statistic RV Percent Paced: 98.35 %
Date Time Interrogation Session: 20250911012930
Implantable Lead Connection Status: 753985
Implantable Lead Implant Date: 20210616
Implantable Lead Location: 753860
Implantable Lead Model: 5076
Implantable Pulse Generator Implant Date: 20210616
Lead Channel Impedance Value: 399 Ohm
Lead Channel Impedance Value: 475 Ohm
Lead Channel Pacing Threshold Amplitude: 0.5 V
Lead Channel Pacing Threshold Pulse Width: 0.4 ms
Lead Channel Sensing Intrinsic Amplitude: 6.875 mV
Lead Channel Sensing Intrinsic Amplitude: 6.875 mV
Lead Channel Setting Pacing Amplitude: 2.5 V
Lead Channel Setting Pacing Pulse Width: 0.4 ms
Lead Channel Setting Sensing Sensitivity: 4 mV
Zone Setting Status: 755011

## 2023-12-19 DIAGNOSIS — Z952 Presence of prosthetic heart valve: Secondary | ICD-10-CM | POA: Diagnosis not present

## 2023-12-19 DIAGNOSIS — Z1331 Encounter for screening for depression: Secondary | ICD-10-CM | POA: Diagnosis not present

## 2023-12-19 DIAGNOSIS — I4821 Permanent atrial fibrillation: Secondary | ICD-10-CM | POA: Diagnosis not present

## 2023-12-19 DIAGNOSIS — Z0000 Encounter for general adult medical examination without abnormal findings: Secondary | ICD-10-CM | POA: Diagnosis not present

## 2023-12-19 DIAGNOSIS — Z7901 Long term (current) use of anticoagulants: Secondary | ICD-10-CM | POA: Diagnosis not present

## 2023-12-20 NOTE — Progress Notes (Signed)
 Remote PPM Transmission

## 2024-01-01 DIAGNOSIS — E6609 Other obesity due to excess calories: Secondary | ICD-10-CM | POA: Diagnosis not present

## 2024-01-01 DIAGNOSIS — E1151 Type 2 diabetes mellitus with diabetic peripheral angiopathy without gangrene: Secondary | ICD-10-CM | POA: Diagnosis not present

## 2024-01-01 DIAGNOSIS — I340 Nonrheumatic mitral (valve) insufficiency: Secondary | ICD-10-CM | POA: Diagnosis not present

## 2024-01-18 ENCOUNTER — Other Ambulatory Visit: Payer: Self-pay | Admitting: Internal Medicine

## 2024-02-01 DIAGNOSIS — E6609 Other obesity due to excess calories: Secondary | ICD-10-CM | POA: Diagnosis not present

## 2024-02-01 DIAGNOSIS — E1151 Type 2 diabetes mellitus with diabetic peripheral angiopathy without gangrene: Secondary | ICD-10-CM | POA: Diagnosis not present

## 2024-02-01 DIAGNOSIS — I340 Nonrheumatic mitral (valve) insufficiency: Secondary | ICD-10-CM | POA: Diagnosis not present

## 2024-02-18 DIAGNOSIS — I4821 Permanent atrial fibrillation: Secondary | ICD-10-CM | POA: Diagnosis not present

## 2024-02-18 DIAGNOSIS — Z23 Encounter for immunization: Secondary | ICD-10-CM | POA: Diagnosis not present

## 2024-02-18 DIAGNOSIS — Z7901 Long term (current) use of anticoagulants: Secondary | ICD-10-CM | POA: Diagnosis not present

## 2024-02-25 DIAGNOSIS — C44329 Squamous cell carcinoma of skin of other parts of face: Secondary | ICD-10-CM | POA: Diagnosis not present

## 2024-03-13 ENCOUNTER — Ambulatory Visit: Payer: PPO | Attending: Cardiology

## 2024-03-13 DIAGNOSIS — I5042 Chronic combined systolic (congestive) and diastolic (congestive) heart failure: Secondary | ICD-10-CM

## 2024-03-14 LAB — CUP PACEART REMOTE DEVICE CHECK
Battery Remaining Longevity: 93 mo
Battery Voltage: 2.99 V
Brady Statistic RV Percent Paced: 98.51 %
Date Time Interrogation Session: 20251211022413
Implantable Lead Connection Status: 753985
Implantable Lead Implant Date: 20210616
Implantable Lead Location: 753860
Implantable Lead Model: 5076
Implantable Pulse Generator Implant Date: 20210616
Lead Channel Impedance Value: 399 Ohm
Lead Channel Impedance Value: 494 Ohm
Lead Channel Pacing Threshold Amplitude: 0.375 V
Lead Channel Pacing Threshold Pulse Width: 0.4 ms
Lead Channel Sensing Intrinsic Amplitude: 6.75 mV
Lead Channel Sensing Intrinsic Amplitude: 6.75 mV
Lead Channel Setting Pacing Amplitude: 2.5 V
Lead Channel Setting Pacing Pulse Width: 0.4 ms
Lead Channel Setting Sensing Sensitivity: 4 mV
Zone Setting Status: 755011

## 2024-03-17 ENCOUNTER — Other Ambulatory Visit: Payer: Self-pay | Admitting: Cardiology

## 2024-03-17 ENCOUNTER — Ambulatory Visit: Payer: Self-pay | Admitting: Cardiology

## 2024-03-20 NOTE — Progress Notes (Signed)
 Remote PPM Transmission
